# Patient Record
Sex: Female | Born: 1945
Health system: Southern US, Community
[De-identification: ages and names within clinical notes are randomized; demographics above are authoritative.]

## PROBLEM LIST (undated history)

## (undated) DIAGNOSIS — F329 Major depressive disorder, single episode, unspecified: Secondary | ICD-10-CM

## (undated) DIAGNOSIS — E785 Hyperlipidemia, unspecified: Secondary | ICD-10-CM

## (undated) DIAGNOSIS — D649 Anemia, unspecified: Secondary | ICD-10-CM

## (undated) DIAGNOSIS — I10 Essential (primary) hypertension: Secondary | ICD-10-CM

## (undated) DIAGNOSIS — F32A Depression, unspecified: Secondary | ICD-10-CM

## (undated) DIAGNOSIS — M48061 Spinal stenosis, lumbar region without neurogenic claudication: Secondary | ICD-10-CM

## (undated) DIAGNOSIS — F419 Anxiety disorder, unspecified: Secondary | ICD-10-CM

## (undated) DIAGNOSIS — T7840XA Allergy, unspecified, initial encounter: Secondary | ICD-10-CM

## (undated) DIAGNOSIS — G43909 Migraine, unspecified, not intractable, without status migrainosus: Secondary | ICD-10-CM

## (undated) DIAGNOSIS — R7303 Prediabetes: Secondary | ICD-10-CM

## (undated) HISTORY — DX: Essential (primary) hypertension: I10

## (undated) HISTORY — DX: Allergy, unspecified, initial encounter: T78.40XA

## (undated) HISTORY — DX: Prediabetes: R73.03

## (undated) HISTORY — DX: Anemia, unspecified: D64.9

## (undated) HISTORY — DX: Anxiety disorder, unspecified: F41.9

## (undated) HISTORY — DX: Depression, unspecified: F32.A

## (undated) HISTORY — PX: BREAST LUMPECTOMY: SHX2

## (undated) HISTORY — DX: Hyperlipidemia, unspecified: E78.5

## (undated) HISTORY — DX: Spinal stenosis, lumbar region without neurogenic claudication: M48.061

## (undated) HISTORY — DX: Migraine, unspecified, not intractable, without status migrainosus: G43.909

## (undated) HISTORY — PX: SPINE SURGERY: SHX786

## (undated) HISTORY — DX: Major depressive disorder, single episode, unspecified: F32.9

---

## 1997-08-15 HISTORY — PX: CHOLECYSTECTOMY: SHX55

## 1999-06-09 ENCOUNTER — Encounter: Payer: Self-pay | Admitting: Obstetrics and Gynecology

## 1999-06-09 ENCOUNTER — Encounter: Admission: RE | Admit: 1999-06-09 | Discharge: 1999-06-09 | Payer: Self-pay | Admitting: Obstetrics and Gynecology

## 1999-06-18 ENCOUNTER — Other Ambulatory Visit: Admission: RE | Admit: 1999-06-18 | Discharge: 1999-06-18 | Payer: Self-pay | Admitting: *Deleted

## 1999-07-02 ENCOUNTER — Encounter: Payer: Self-pay | Admitting: *Deleted

## 1999-07-02 ENCOUNTER — Encounter: Admission: RE | Admit: 1999-07-02 | Discharge: 1999-07-02 | Payer: Self-pay | Admitting: *Deleted

## 1999-07-13 ENCOUNTER — Encounter (INDEPENDENT_AMBULATORY_CARE_PROVIDER_SITE_OTHER): Payer: Self-pay | Admitting: Specialist

## 1999-07-13 ENCOUNTER — Other Ambulatory Visit: Admission: RE | Admit: 1999-07-13 | Discharge: 1999-07-13 | Payer: Self-pay | Admitting: *Deleted

## 2000-06-19 ENCOUNTER — Other Ambulatory Visit: Admission: RE | Admit: 2000-06-19 | Discharge: 2000-06-19 | Payer: Self-pay | Admitting: *Deleted

## 2000-11-03 ENCOUNTER — Encounter: Payer: Self-pay | Admitting: *Deleted

## 2000-11-03 ENCOUNTER — Encounter: Admission: RE | Admit: 2000-11-03 | Discharge: 2000-11-03 | Payer: Self-pay | Admitting: *Deleted

## 2001-07-04 ENCOUNTER — Other Ambulatory Visit: Admission: RE | Admit: 2001-07-04 | Discharge: 2001-07-04 | Payer: Self-pay | Admitting: Obstetrics and Gynecology

## 2002-07-05 ENCOUNTER — Other Ambulatory Visit: Admission: RE | Admit: 2002-07-05 | Discharge: 2002-07-05 | Payer: Self-pay | Admitting: Obstetrics and Gynecology

## 2003-06-11 ENCOUNTER — Other Ambulatory Visit: Admission: RE | Admit: 2003-06-11 | Discharge: 2003-06-11 | Payer: Self-pay | Admitting: Obstetrics and Gynecology

## 2003-06-13 ENCOUNTER — Encounter: Admission: RE | Admit: 2003-06-13 | Discharge: 2003-06-13 | Payer: Self-pay | Admitting: *Deleted

## 2004-02-09 ENCOUNTER — Ambulatory Visit (HOSPITAL_COMMUNITY): Admission: RE | Admit: 2004-02-09 | Discharge: 2004-02-09 | Payer: Self-pay | Admitting: Gastroenterology

## 2004-06-25 ENCOUNTER — Encounter: Admission: RE | Admit: 2004-06-25 | Discharge: 2004-06-25 | Payer: Self-pay | Admitting: Obstetrics and Gynecology

## 2004-09-15 ENCOUNTER — Ambulatory Visit (HOSPITAL_COMMUNITY): Admission: RE | Admit: 2004-09-15 | Discharge: 2004-09-15 | Payer: Self-pay | Admitting: Obstetrics and Gynecology

## 2004-09-15 ENCOUNTER — Encounter (INDEPENDENT_AMBULATORY_CARE_PROVIDER_SITE_OTHER): Payer: Self-pay | Admitting: Specialist

## 2004-09-15 ENCOUNTER — Ambulatory Visit (HOSPITAL_BASED_OUTPATIENT_CLINIC_OR_DEPARTMENT_OTHER): Admission: RE | Admit: 2004-09-15 | Discharge: 2004-09-15 | Payer: Self-pay | Admitting: Obstetrics and Gynecology

## 2005-07-22 ENCOUNTER — Encounter: Admission: RE | Admit: 2005-07-22 | Discharge: 2005-07-22 | Payer: Self-pay | Admitting: Obstetrics and Gynecology

## 2006-07-24 ENCOUNTER — Encounter: Admission: RE | Admit: 2006-07-24 | Discharge: 2006-07-24 | Payer: Self-pay | Admitting: Orthopaedic Surgery

## 2006-07-27 ENCOUNTER — Encounter: Admission: RE | Admit: 2006-07-27 | Discharge: 2006-07-27 | Payer: Self-pay | Admitting: Obstetrics and Gynecology

## 2006-08-25 ENCOUNTER — Encounter: Admission: RE | Admit: 2006-08-25 | Discharge: 2006-08-25 | Payer: Self-pay | Admitting: Internal Medicine

## 2006-09-13 ENCOUNTER — Encounter (INDEPENDENT_AMBULATORY_CARE_PROVIDER_SITE_OTHER): Payer: Self-pay | Admitting: Specialist

## 2006-09-13 ENCOUNTER — Ambulatory Visit (HOSPITAL_COMMUNITY): Admission: RE | Admit: 2006-09-13 | Discharge: 2006-09-13 | Payer: Self-pay | Admitting: General Surgery

## 2007-07-31 ENCOUNTER — Encounter: Admission: RE | Admit: 2007-07-31 | Discharge: 2007-07-31 | Payer: Self-pay | Admitting: Obstetrics and Gynecology

## 2008-08-01 ENCOUNTER — Encounter: Admission: RE | Admit: 2008-08-01 | Discharge: 2008-08-01 | Payer: Self-pay | Admitting: Obstetrics and Gynecology

## 2009-08-03 ENCOUNTER — Encounter: Admission: RE | Admit: 2009-08-03 | Discharge: 2009-08-03 | Payer: Self-pay | Admitting: Obstetrics and Gynecology

## 2010-06-18 ENCOUNTER — Encounter: Admission: RE | Admit: 2010-06-18 | Discharge: 2010-06-18 | Payer: Self-pay | Admitting: Orthopaedic Surgery

## 2010-09-02 ENCOUNTER — Other Ambulatory Visit: Payer: Self-pay | Admitting: Obstetrics and Gynecology

## 2010-09-05 ENCOUNTER — Encounter: Payer: Self-pay | Admitting: Orthopaedic Surgery

## 2010-12-31 NOTE — Op Note (Signed)
NAME:  TIKI, TUCCIARONE           ACCOUNT NO.:  [id number]   MEDICAL RECORD NO.:  [id number]          PATIENT TYPE:  AMB   LOCATION:  DAY                          FACILITY:  [facility]   PHYSICIAN:  Anselm Pancoast. Weatherly, M.D.DATE OF BIRTH:  May 23, 1946   DATE OF PROCEDURE:  09/13/2006  DATE OF DISCHARGE:                               OPERATIVE REPORT   PREOPERATIVE DIAGNOSIS:  Chronic cholecystitis with stones.   POSTOPERATIVE DIAGNOSIS:  Chronic cholecystitis with stones, kind of a  subacute gallbladder.   OPERATION:  Laparoscopic cholecystectomy with cholangiogram.   SURGEON:  Anselm Pancoast. Zachery Dakins, M.D.   ASSISTANT:  Nurse.   HISTORY:  Yajahira Tison is a 65 year old Caucasian female who was  referred to me about 2 weeks ago with intermittent symptomatic  gallstones. She is followed by Dr. Marisue Brooklyn and has had  intermittent episodes of indigestion and epigastric pain, recently  became more frequently. She takes medicine for migraine headaches and  then having the upper abdominal symptoms that are thought related to her  gallbladder. She does have a large stone in her gallbladder, and her x-  ray did not show 2 weeks ago that she really had an acute gallbladder.  Work arrangements were needed to made to allow her to be off for a few  days, and she picked a day for the planned surgery. Preoperatively, her  CMET and CBC were normal. The patient was given 3 g of Unasyn. She had  PAS stockings. She has had no previous upper abdominal surgery, and a  small incision was made below the umbilicus. The fascia was identified,  and then the posterior peritoneum rectus fascia was also picked up and  then opened through individually. It appears that really she has no kind  of single folded area at the umbilicus. A pursestring suture was placed,  the Hasson cannula introduced and then the gallbladder inspected. The  gallbladder was quite tense and kind of edematous as if this is kind  of  subacute. The upper 10-mm trocar is placed after anesthetizing the  fascia at the subxiphoid, and the 2 lateral 5-mm trocars were placed in  the appropriate position under direct vision. The gallbladder was  retracted upward and outward, the proximal portion of the gallbladder. I  first dissected off of the anterior branch of the cystic artery and  doubly clipped it proximally, singly distally and divided, and that gave  [place] good exposure to the cystic duct. A clip was placed across the cystic  duct gallbladder junction and then a little opening made proximally, the  Cook catheter introduced, and x-ray obtained which gave good prompt fill  of the extra hepatic biliary system. The catheter was removed. The cyst  duct was triply clipped and then divided and then carefully we dissected  this very thick, very swollen gallbladder from its bed. I clipped what I  think was a posterior branch of the cystic artery, and then the  remaining portion dissected off easily. There were a couple of areas  where the liver bed was exposed, and these were lightly coagulated at  the end. We placed the  gallbladder in an EndoCatch bag, switched the  camera to the upper 10-mm port, grabbed the bag, pulled it up through  the fascia, and then because of the size of the stone, we kind of pulled  the gallbladder up and then opened the gallbladder, releasing the bile  with the aspirator, and then everything would come through the fascial  defect. The gallbladder was removed from the field. There was a little  bit of bile with the gush of bile on opening that was irrigated from the  wound, and then we put 2 figure-of-eight of 0 Vicryl in the anterior  fascia under direct vision, irrigating all of the various layers as we  closed this. The 5-mm trocars were removed under direct vision after  removing the irrigating fluid, the minimal amount that had been used,  which was aspirated. The upper 10-mm trocar was  withdrawn under direct  vision, and the subcutaneous wounds were closed with 4-0 Vicryl and  Benzoin and Steri-Strips on the skin. The patient tolerated the  procedure nicely, and she hopes to be released today. I will see her in  the office in follow up in approximately 2 weeks, providing she is doing  nicely.           ______________________________  Anselm Pancoast. Zachery Dakins, M.D.     WJW/MEDQ  D:  09/13/2006  T:  09/13/2006  Job:  [id number]   cc:   Lovenia Kim, D.O.  Fax: [phone]   Anselm Pancoast. Zachery Dakins, M.D.  [address]  [place]  [place] [address]

## 2010-12-31 NOTE — Op Note (Signed)
NAME:  Rebekah Hamilton, Rebekah Hamilton                     ACCOUNT NO.:  [id number]   MEDICAL RECORD NO.:  [id number]                   PATIENT TYPE:  AMB   LOCATION:  ENDO                                 FACILITY:  [facility]   PHYSICIAN:  James L. Malon Kindle., M.D.          DATE OF BIRTH:  08-07-1946   DATE OF PROCEDURE:  02/09/2004  DATE OF DISCHARGE:                                 OPERATIVE REPORT   PROCEDURE:  Colonoscopy.   MEDICATIONS:  Fentanyl 87.5 mcg, Versed 8 mg IV.   INDICATIONS FOR PROCEDURE:  Colon cancer screening.   DESCRIPTION OF PROCEDURE:  The procedure had been explained to the patient  and consent obtained. With the patient in the left lateral decubitus  position, the Olympus scope was inserted and advanced. The prep was  excellent. We were able to advance easily to the cecum.  The ileocecal valve  and appendiceal orifice were seen. The scope was withdrawn and the cecum,  ascending colon, transverse colon, descending and sigmoid colon were seen  well and no polyps were seen.  There was no significant diverticular  disease. The scope was withdrawn in the rectum and the rectum was free of  polyps. The scope withdrawn.  The patient tolerated the procedure well.   ASSESSMENT:  Normal screening colonoscopy, V76.51.   PLAN:  Would recommend checking Hemoccults yearly and recommend repeating  colonoscopy in 10 years.                                               James L. Malon Kindle., M.D.    Waldron Session  D:  02/09/2004  T:  02/09/2004  Job:  [id number]   cc:   Dellis Anes. Idell Pickles, M.D.  [address]  [place]  [place] [address]  Fax: [phone]

## 2010-12-31 NOTE — Op Note (Signed)
NAME:  Rebekah Hamilton, Rebekah Hamilton           ACCOUNT NO.:  [id number]   MEDICAL RECORD NO.:  [id number]          PATIENT TYPE:  AMB   LOCATION:  [facility]                         FACILITY:  [facility]   PHYSICIAN:  Sherry A. Dickstein, M.D.DATE OF BIRTH:  11-30-1945   DATE OF PROCEDURE:  09/15/2004  DATE OF DISCHARGE:                                 OPERATIVE REPORT   PREOPERATIVE DIAGNOSES:  Postmenopausal bleeding, endometrial polyp.   POSTOPERATIVE DIAGNOSES:  Postmenopausal bleeding, endometrial polyp.   PROCEDURES:  Dilatation and curettage, hysteroscopy with resectoscope.   SURGEON:  Sherry A. Rosalio Macadamia, M.D.   ANESTHESIA:  MAC.   INDICATIONS:  This is a 64 year old, G2, P2, 0-0-2 woman, who has been on  hormone replacement therapy for several years.  The patient just recently  complained of irregular spotting which has been recurrent and intermittent  over the past year.  Because of this, the patient had an ultrasound.  Ultrasound revealed thickened endometrium with polypoid tissue seen on  ultrasound.  Because of this, the patient is brought to the operating room  for [facility], hysteroscopy with resectoscope.   FINDINGS:  Normal size anteflexed uterus, no adnexal mass, endometrial  present on the right side of the uterus.   PROCEDURE:  The patient was brought into the operating room and given  adequate IV sedation.  She was placed in the dorsal lithotomy position.  Her  perineum was washed with Hibiclens.  Pelvic examination was performed.  The  patient was draped in a sterile fashion.  Speculum was placed within the  vagina.  The vagina was washed with Hibiclens.  A paracervical block was  administered with 1% ethocaine.  Anterior lip of the cervix was grasped with  a single-toothed tenaculum.  The cervix was sounded.  The cervix was dilated  with Pratt dilators to a #31.  The hysteroscope was introduced into the  endometrial cavity.  Pictures were obtained.  Using a double loop right  angle  resector, the endometrial polyp was resected first.  Then, superficial  sheets of endometrium was resected circumferentially.  Adequate hemostasis  was present.  All instruments were removed from the vagina.  The patient was  taken out of the dorsal lithotomy position.  She was awakened. She was moved  from the operating table to a stretcher in stable condition.   COMPLICATIONS:  None.   ESTIMATED BLOOD LOSS:  Less than 5 cc.   SORBITOL DIFFERENTIAL:  -35 cc.      SAD/MEDQ  D:  09/15/2004  T:  09/15/2004  Job:  [id number]

## 2011-02-02 ENCOUNTER — Other Ambulatory Visit: Payer: Self-pay | Admitting: Obstetrics and Gynecology

## 2011-02-02 DIAGNOSIS — Z1231 Encounter for screening mammogram for malignant neoplasm of breast: Secondary | ICD-10-CM

## 2011-02-21 ENCOUNTER — Ambulatory Visit
Admission: RE | Admit: 2011-02-21 | Discharge: 2011-02-21 | Disposition: A | Discharge status: Home / Self Care | Payer: BC Managed Care – PPO | Source: Ambulatory Visit | Attending: Obstetrics and Gynecology | Admitting: Obstetrics and Gynecology

## 2011-02-21 DIAGNOSIS — Z1231 Encounter for screening mammogram for malignant neoplasm of breast: Secondary | ICD-10-CM

## 2011-12-09 ENCOUNTER — Other Ambulatory Visit: Payer: Self-pay | Admitting: Orthopaedic Surgery

## 2011-12-09 DIAGNOSIS — M545 Low back pain, unspecified: Secondary | ICD-10-CM

## 2011-12-09 DIAGNOSIS — M79604 Pain in right leg: Secondary | ICD-10-CM

## 2011-12-12 ENCOUNTER — Ambulatory Visit
Admission: RE | Admit: 2011-12-12 | Discharge: 2011-12-12 | Disposition: A | Discharge status: Home / Self Care | Payer: BC Managed Care – PPO | Source: Ambulatory Visit | Attending: Orthopaedic Surgery | Admitting: Orthopaedic Surgery

## 2011-12-12 VITALS — BP 140/75 | HR 61

## 2011-12-12 DIAGNOSIS — M79604 Pain in right leg: Secondary | ICD-10-CM

## 2011-12-12 DIAGNOSIS — M545 Low back pain, unspecified: Secondary | ICD-10-CM

## 2011-12-12 MED ORDER — METHYLPREDNISOLONE ACETATE 40 MG/ML INJ SUSP (RADIOLOG
120.0000 mg | Freq: Once | INTRAMUSCULAR | Status: AC
  Administered 2011-12-12: 120 mg via EPIDURAL

## 2011-12-12 MED ORDER — IOHEXOL 180 MG/ML  SOLN
1.0000 mL | Freq: Once | INTRAMUSCULAR | Status: AC | PRN
  Administered 2011-12-12: 1 mL via EPIDURAL

## 2011-12-12 NOTE — Discharge Instructions (Signed)

## 2012-06-06 ENCOUNTER — Other Ambulatory Visit: Payer: Self-pay | Admitting: Obstetrics and Gynecology

## 2012-06-06 DIAGNOSIS — Z1231 Encounter for screening mammogram for malignant neoplasm of breast: Secondary | ICD-10-CM

## 2012-07-06 ENCOUNTER — Ambulatory Visit
Admission: RE | Admit: 2012-07-06 | Discharge: 2012-07-06 | Disposition: A | Discharge status: Home / Self Care | Payer: Medicare Other | Source: Ambulatory Visit | Attending: Obstetrics and Gynecology | Admitting: Obstetrics and Gynecology

## 2012-07-06 DIAGNOSIS — Z1231 Encounter for screening mammogram for malignant neoplasm of breast: Secondary | ICD-10-CM

## 2012-07-09 ENCOUNTER — Ambulatory Visit: Payer: BC Managed Care – PPO

## 2013-07-19 ENCOUNTER — Other Ambulatory Visit: Payer: Self-pay

## 2013-07-19 DIAGNOSIS — Z1231 Encounter for screening mammogram for malignant neoplasm of breast: Secondary | ICD-10-CM

## 2013-08-12 ENCOUNTER — Ambulatory Visit (INDEPENDENT_AMBULATORY_CARE_PROVIDER_SITE_OTHER): Payer: Medicare Other

## 2013-08-12 DIAGNOSIS — Z23 Encounter for immunization: Secondary | ICD-10-CM

## 2013-08-12 NOTE — Progress Notes (Signed)
Patient ID: Rebekah Hamilton, female   DOB: [birth year], 67 y.o.   MRN: [id number] Patient here today for flu vaccination, tolerated well, 0.5 cc left deltoid.

## 2013-08-26 ENCOUNTER — Ambulatory Visit
Admission: RE | Admit: 2013-08-26 | Discharge: 2013-08-26 | Disposition: A | Discharge status: Home / Self Care | Payer: Medicare Other | Source: Ambulatory Visit

## 2013-08-26 DIAGNOSIS — Z1231 Encounter for screening mammogram for malignant neoplasm of breast: Secondary | ICD-10-CM | POA: Diagnosis not present

## 2013-10-31 DIAGNOSIS — T7840XA Allergy, unspecified, initial encounter: Secondary | ICD-10-CM | POA: Insufficient documentation

## 2013-10-31 DIAGNOSIS — I10 Essential (primary) hypertension: Secondary | ICD-10-CM | POA: Insufficient documentation

## 2013-10-31 DIAGNOSIS — E782 Mixed hyperlipidemia: Secondary | ICD-10-CM | POA: Insufficient documentation

## 2013-10-31 DIAGNOSIS — F339 Major depressive disorder, recurrent, unspecified: Secondary | ICD-10-CM | POA: Insufficient documentation

## 2013-10-31 DIAGNOSIS — M48061 Spinal stenosis, lumbar region without neurogenic claudication: Secondary | ICD-10-CM | POA: Insufficient documentation

## 2013-10-31 DIAGNOSIS — R7309 Other abnormal glucose: Secondary | ICD-10-CM | POA: Insufficient documentation

## 2013-10-31 DIAGNOSIS — G43909 Migraine, unspecified, not intractable, without status migrainosus: Secondary | ICD-10-CM | POA: Insufficient documentation

## 2013-10-31 DIAGNOSIS — E785 Hyperlipidemia, unspecified: Secondary | ICD-10-CM

## 2013-10-31 DIAGNOSIS — F419 Anxiety disorder, unspecified: Secondary | ICD-10-CM | POA: Insufficient documentation

## 2013-10-31 DIAGNOSIS — F3340 Major depressive disorder, recurrent, in remission, unspecified: Secondary | ICD-10-CM | POA: Insufficient documentation

## 2013-11-01 ENCOUNTER — Ambulatory Visit (INDEPENDENT_AMBULATORY_CARE_PROVIDER_SITE_OTHER): Payer: Medicare Other | Admitting: Physician Assistant

## 2013-11-01 ENCOUNTER — Encounter: Payer: Self-pay | Admitting: Physician Assistant

## 2013-11-01 VITALS — BP 138/78 | HR 80 | Temp 97.7°F | Resp 16 | Ht 62.5 in | Wt 158.0 lb

## 2013-11-01 DIAGNOSIS — R7303 Prediabetes: Secondary | ICD-10-CM

## 2013-11-01 DIAGNOSIS — F3289 Other specified depressive episodes: Secondary | ICD-10-CM

## 2013-11-01 DIAGNOSIS — I10 Essential (primary) hypertension: Secondary | ICD-10-CM

## 2013-11-01 DIAGNOSIS — F32A Depression, unspecified: Secondary | ICD-10-CM

## 2013-11-01 DIAGNOSIS — Z79899 Other long term (current) drug therapy: Secondary | ICD-10-CM | POA: Diagnosis not present

## 2013-11-01 DIAGNOSIS — F329 Major depressive disorder, single episode, unspecified: Secondary | ICD-10-CM | POA: Diagnosis not present

## 2013-11-01 DIAGNOSIS — R7309 Other abnormal glucose: Secondary | ICD-10-CM | POA: Diagnosis not present

## 2013-11-01 DIAGNOSIS — E559 Vitamin D deficiency, unspecified: Secondary | ICD-10-CM

## 2013-11-01 DIAGNOSIS — E785 Hyperlipidemia, unspecified: Secondary | ICD-10-CM | POA: Diagnosis not present

## 2013-11-01 DIAGNOSIS — F419 Anxiety disorder, unspecified: Secondary | ICD-10-CM

## 2013-11-01 DIAGNOSIS — Z0000 Encounter for general adult medical examination without abnormal findings: Secondary | ICD-10-CM | POA: Diagnosis not present

## 2013-11-01 DIAGNOSIS — G43909 Migraine, unspecified, not intractable, without status migrainosus: Secondary | ICD-10-CM

## 2013-11-01 LAB — BASIC METABOLIC PANEL WITH GFR
BUN: 12 mg/dL (ref 6–23)
CALCIUM: 9.5 mg/dL (ref 8.4–10.5)
CO2: 26 meq/L (ref 19–32)
CREATININE: 0.82 mg/dL (ref 0.50–1.10)
Chloride: 107 mEq/L (ref 96–112)
GFR, EST AFRICAN AMERICAN: 86 mL/min
GFR, Est Non African American: 75 mL/min
GLUCOSE: 85 mg/dL (ref 70–99)
Potassium: 3.9 mEq/L (ref 3.5–5.3)
SODIUM: 144 meq/L (ref 135–145)

## 2013-11-01 LAB — LIPID PANEL
Cholesterol: 184 mg/dL (ref 0–200)
HDL: 45 mg/dL (ref >39)
LDL Cholesterol: 110 mg/dL — ABNORMAL HIGH (ref 0–99)
Total CHOL/HDL Ratio: 4.1 Ratio
Triglycerides: 145 mg/dL (ref <150)
VLDL: 29 mg/dL (ref 0–40)

## 2013-11-01 LAB — CBC WITH DIFFERENTIAL/PLATELET
BASOS PCT: 0 % (ref 0–1)
Basophils Absolute: 0 10*3/uL (ref 0.0–0.1)
EOS ABS: 0.2 10*3/uL (ref 0.0–0.7)
EOS PCT: 2 % (ref 0–5)
HCT: 46 % (ref 36.0–46.0)
Hemoglobin: 15.6 g/dL — ABNORMAL HIGH (ref 12.0–15.0)
Lymphocytes Relative: 35 % (ref 12–46)
Lymphs Abs: 2.6 10*3/uL (ref 0.7–4.0)
MCH: 30.2 pg (ref 26.0–34.0)
MCHC: 33.9 g/dL (ref 30.0–36.0)
MCV: 89.1 fL (ref 78.0–100.0)
MONOS PCT: 9 % (ref 3–12)
Monocytes Absolute: 0.7 10*3/uL (ref 0.1–1.0)
NEUTROS PCT: 54 % (ref 43–77)
Neutro Abs: 4.1 10*3/uL (ref 1.7–7.7)
PLATELETS: 306 10*3/uL (ref 150–400)
RBC: 5.16 MIL/uL — ABNORMAL HIGH (ref 3.87–5.11)
RDW: 13.3 % (ref 11.5–15.5)
WBC: 7.5 10*3/uL (ref 4.0–10.5)

## 2013-11-01 LAB — HEPATIC FUNCTION PANEL
ALBUMIN: 4.6 g/dL (ref 3.5–5.2)
ALT: 18 U/L (ref 0–35)
AST: 20 U/L (ref 0–37)
Alkaline Phosphatase: 53 U/L (ref 39–117)
Bilirubin, Direct: 0.1 mg/dL (ref 0.0–0.3)
Indirect Bilirubin: 0.3 mg/dL (ref 0.2–1.2)
TOTAL PROTEIN: 6.9 g/dL (ref 6.0–8.3)
Total Bilirubin: 0.4 mg/dL (ref 0.2–1.2)

## 2013-11-01 LAB — HEMOGLOBIN A1C
HEMOGLOBIN A1C: 5.8 % — AB (ref <5.7)
Mean Plasma Glucose: 120 mg/dL — ABNORMAL HIGH (ref <117)

## 2013-11-01 LAB — MAGNESIUM: MAGNESIUM: 2 mg/dL (ref 1.5–2.5)

## 2013-11-01 MED ORDER — PHENTERMINE HCL 37.5 MG PO TABS
37.5000 mg | ORAL_TABLET | Freq: Every day | ORAL | Status: DC
Start: 2013-11-01 — End: 2013-12-02

## 2013-11-01 MED ORDER — ALPRAZOLAM 0.25 MG PO TABS: 0.2500 mg | ORAL_TABLET | Freq: Three times a day (TID) | ORAL | Status: DC | PRN

## 2013-11-01 MED ORDER — VORTIOXETINE HBR 10 MG PO TABS
ORAL_TABLET | ORAL | Status: DC
Start: 2013-11-01 — End: 2014-02-04

## 2013-11-01 NOTE — Patient Instructions (Signed)
Phentermine  While taking the medication we will ask that you come into the office once a month to monitor your weight, blood pressure, and heart rate. In addition we can help answer your questions about diet, exercise, and help you every step of the way with your weight loss journey. Sometime it is helpful if you bring in a food diary or use an app on your phone such as myfitnesspal to record your calorie intake, especially in the beginning.   You can start out on 1/3 to 1/2 a pill in the morning and if you are tolerating it well you can increase to one pill daily.   What is this medicine? PHENTERMINE (FEN ter meen) decreases your appetite. This medicine is intended to be used in addition to a healthy reduced calorie diet and exercise. The best results are achieved this way. This medicine is only indicated for short-term use. Eventually your weight loss may level out and the medication will no longer be needed.   How should I use this medicine? Take this medicine by mouth. Follow the directions on the prescription label. The tablets should stay in the bottle until immediately before you take your dose. Take your doses at regular intervals. Do not take your medicine more often than directed.  Overdosage: If you think you have taken too much of this medicine contact a poison control center or emergency room at once. NOTE: This medicine is only for you. Do not share this medicine with others.  What if I miss a dose? If you miss a dose, take it as soon as you can. If it is almost time for your next dose, take only that dose. Do not take double or extra doses. Do not increase or in any way change your dose without consulting your doctor.  What should I watch for while using this medicine? Notify your physician immediately if you become short of breath while doing your normal activities. Do not take this medicine within 6 hours of bedtime. It can keep you from getting to sleep. Avoid drinks that contain  caffeine and try to stick to a regular bedtime every night. Do not stand or sit up quickly, especially if you are an older patient. This reduces the risk of dizzy or fainting spells. Avoid alcoholic drinks.  What side effects may I notice from receiving this medicine? Side effects that you should report to your doctor or health care professional as soon as possible: -chest pain, palpitations -depression or severe changes in mood -increased blood pressure -irritability -nervousness or restlessness -severe dizziness -shortness of breath -problems urinating -unusual swelling of the legs -vomiting  Side effects that usually do not require medical attention (report to your doctor or health care professional if they continue or are bothersome): -blurred vision or other eye problems -changes in sexual ability or desire -constipation or diarrhea -difficulty sleeping -dry mouth or unpleasant taste -headache -nausea This list may not describe all possible side effects. Call your doctor for medical advice about side effects. You may report side effects to FDA at 1-800-FDA-1088.  What is the TMJ? The temporomandibular (tem-PUH-ro-man-DIB-yoo-ler) joint, or the TMJ, connects the upper and lower jawbones. This joint allows the jaw to open wide and move back and forth when you chew, talk, or yawn.There are also several muscles that help this joint move. There can be muscle tightness and pain in the muscle that can cause several symptoms.  What causes TMJ pain? There are many causes of TMJ pain. Repeated  chewing (for example, chewing gum) and clenching your teeth can cause pain in the joint. Some TMJ pain has no obvious cause. What can I do to ease the pain? There are many things you can do to help your pain get better. When you have pain:  Eat soft foods and stay away from chewy foods (for example, taffy) Try to use both sides of your mouth to chew Don't chew gum Don't open your mouth wide (for  example, during yawning or singing) Don't bite your cheeks or fingernails Lower your amount of stress and worry Applying a warm, damp washcloth to the joint may help. Over-the-counter pain medicines such as ibuprofen (one brand: Advil) or acetaminophen (one brand: Tylenol) might also help. Do not use these medicines if you are allergic to them or if your doctor told you not to use them. How can I stop the pain from coming back? When your pain is better, you can do these exercises to make your muscles stronger and to keep the pain from coming back:  Resisted mouth opening: Place your thumb or two fingers under your chin and open your mouth slowly, pushing up lightly on your chin with your thumb. Hold for three to six seconds. Close your mouth slowly. Resisted mouth closing: Place your thumbs under your chin and your two index fingers on the ridge between your mouth and the bottom of your chin. Push down lightly on your chin as you close your mouth. Tongue up: Slowly open and close your mouth while keeping the tongue touching the roof of the mouth. Side-to-side jaw movement: Place an object about one fourth of an inch thick (for example, two tongue depressors) between your front teeth. Slowly move your jaw from side to side. Increase the thickness of the object as the exercise becomes easier Forward jaw movement: Place an object about one fourth of an inch thick between your front teeth and move the bottom jaw forward so that the bottom teeth are in front of the top teeth. Increase the thickness of the object as the exercise becomes easier. These exercises should not be painful. If it hurts to do these exercises, stop doing them and talk to your family doctor.   Preventative Care for Adults - Female      MAINTAIN REGULAR HEALTH EXAMS:  A routine yearly physical is a good way to check in with your primary care provider about your health and preventive screening. It is also an opportunity to share  updates about your health and any concerns you have, and receive a thorough all-over exam.   Most health insurance companies pay for at least some preventative services.  Check with your health plan for specific coverages.  WHAT PREVENTATIVE SERVICES DO WOMEN NEED?  Adult women should have their weight and blood pressure checked regularly.   Women age 56 and older should have their cholesterol levels checked regularly.  Women should be screened for cervical cancer with a Pap smear and pelvic exam beginning at either age 1, or 3 years after they become sexually activity.    Breast cancer screening generally begins at age 91 with a mammogram and breast exam by your primary care provider.    Beginning at age 15 and continuing to age 65, women should be screened for colorectal cancer.  Certain people may need continued testing until age 26.  Updating vaccinations is part of preventative care.  Vaccinations help protect against diseases such as the flu.  Osteoporosis is a disease in  which the bones lose minerals and strength as we age. Women ages 2565 and over should discuss this with their caregivers, as should women after menopause who have other risk factors.  Lab tests are generally done as part of preventative care to screen for anemia and blood disorders, to screen for problems with the kidneys and liver, to screen for bladder problems, to check blood sugar, and to check your cholesterol level.  Preventative services generally include counseling about diet, exercise, avoiding tobacco, drugs, excessive alcohol consumption, and sexually transmitted infections.    GENERAL RECOMMENDATIONS FOR GOOD HEALTH:  Healthy diet:  Eat a variety of foods, including fruit, vegetables, animal or vegetable protein, such as meat, fish, chicken, and eggs, or beans, lentils, tofu, and grains, such as rice.  Drink plenty of water daily.  Decrease saturated fat in the diet, avoid lots of red meat, processed  foods, sweets, fast foods, and fried foods.  Exercise:  Aerobic exercise helps maintain good heart health. At least 30-40 minutes of moderate-intensity exercise is recommended. For example, a brisk walk that increases your heart rate and breathing. This should be done on most days of the week.   Find a type of exercise or a variety of exercises that you enjoy so that it becomes a part of your daily life.  Examples are running, walking, swimming, water aerobics, and biking.  For motivation and support, explore group exercise such as aerobic class, spin class, Zumba, Yoga,or  martial arts, etc.    Set exercise goals for yourself, such as a certain weight goal, walk or run in a race such as a 5k walk/run.  Speak to your primary care provider about exercise goals.  Disease prevention:  If you smoke or chew tobacco, find out from your caregiver how to quit. It can literally save your life, no matter how long you have been a tobacco user. If you do not use tobacco, never begin.   Maintain a healthy diet and normal weight. Increased weight leads to problems with blood pressure and diabetes.   The Body Mass Index or BMI is a way of measuring how much of your body is fat. Having a BMI above 27 increases the risk of heart disease, diabetes, hypertension, stroke and other problems related to obesity. Your caregiver can help determine your BMI and based on it develop an exercise and dietary program to help you achieve or maintain this important measurement at a healthful level.  High blood pressure causes heart and blood vessel problems.  Persistent high blood pressure should be treated with medicine if weight loss and exercise do not work.   Fat and cholesterol leaves deposits in your arteries that can block them. This causes heart disease and vessel disease elsewhere in your body.  If your cholesterol is found to be high, or if you have heart disease or certain other medical conditions, then you may need  to have your cholesterol monitored frequently and be treated with medication.   Ask if you should have a cardiac stress test if your history suggests this. A stress test is a test done on a treadmill that looks for heart disease. This test can find disease prior to there being a problem.  Menopause can be associated with physical symptoms and risks. Hormone replacement therapy is available to decrease these. You should talk to your caregiver about whether starting or continuing to take hormones is right for you.   Osteoporosis is a disease in which the bones lose minerals  and strength as we age. This can result in serious bone fractures. Risk of osteoporosis can be identified using a bone density scan. Women ages 52 and over should discuss this with their caregivers, as should women after menopause who have other risk factors. Ask your caregiver whether you should be taking a calcium supplement and Vitamin D, to reduce the rate of osteoporosis.   Avoid drinking alcohol in excess (more than two drinks per day).  Avoid use of street drugs. Do not share needles with anyone. Ask for professional help if you need assistance or instructions on stopping the use of alcohol, cigarettes, and/or drugs.  Brush your teeth twice a day with fluoride toothpaste, and floss once a day. Good oral hygiene prevents tooth decay and gum disease. The problems can be painful, unattractive, and can cause other health problems. Visit your dentist for a routine oral and dental check up and preventive care every 6-12 months.   Look at your skin regularly.  Use a mirror to look at your back. Notify your caregivers of changes in moles, especially if there are changes in shapes, colors, a size larger than a pencil eraser, an irregular border, or development of new moles.  Safety:  Use seatbelts 100% of the time, whether driving or as a passenger.  Use safety devices such as hearing protection if you work in environments with loud  noise or significant background noise.  Use safety glasses when doing any work that could send debris in to the eyes.  Use a helmet if you ride a bike or motorcycle.  Use appropriate safety gear for contact sports.  Talk to your caregiver about gun safety.  Use sunscreen with a SPF (or skin protection factor) of 15 or greater.  Lighter skinned people are at a greater risk of skin cancer. Don't forget to also wear sunglasses in order to protect your eyes from too much damaging sunlight. Damaging sunlight can accelerate cataract formation.   Practice safe sex. Use condoms. Condoms are used for birth control and to help reduce the spread of sexually transmitted infections (or STIs).  Some of the STIs are gonorrhea (the clap), chlamydia, syphilis, trichomonas, herpes, HPV (human papilloma virus) and HIV (human immunodeficiency virus) which causes AIDS. The herpes, HIV and HPV are viral illnesses that have no cure. These can result in disability, cancer and death.   Keep carbon monoxide and smoke detectors in your home functioning at all times. Change the batteries every 6 months or use a model that plugs into the wall.   Vaccinations:  Stay up to date with your tetanus shots and other required immunizations. You should have a booster for tetanus every 10 years. Be sure to get your flu shot every year, since 5%-20% of the [place] population comes down with the flu. The flu vaccine changes each year, so being vaccinated once is not enough. Get your shot in the fall, before the flu season peaks.   Other vaccines to consider:  Human Papilloma Virus or HPV causes cancer of the cervix, and other infections that can be transmitted from person to person. There is a vaccine for HPV, and females should get immunized between the ages of 59 and 15. It requires a series of 3 shots.   Pneumococcal vaccine to protect against certain types of pneumonia.  This is normally recommended for adults age 18 or older.  However,  adults younger than 67 years old with certain underlying conditions such as diabetes, heart or lung disease  should also receive the vaccine.  Shingles vaccine to protect against Varicella Zoster if you are older than age 74, or younger than 67 years old with certain underlying illness.  Hepatitis A vaccine to protect against a form of infection of the liver by a virus acquired from food.  Hepatitis B vaccine to protect against a form of infection of the liver by a virus acquired from blood or body fluids, particularly if you work in health care.  If you plan to travel internationally, check with your local health department for specific vaccination recommendations.  Cancer Screening:  Breast cancer screening is essential to preventive care for women. All women age 60 and older should perform a breast self-exam every month. At age 52 and older, women should have their caregiver complete a breast exam each year. Women at ages 72 and older should have a mammogram (x-ray film) of the breasts. Your caregiver can discuss how often you need mammograms.    Cervical cancer screening includes taking a Pap smear (sample of cells examined under a microscope) from the cervix (end of the uterus). It also includes testing for HPV (Human Papilloma Virus, which can cause cervical cancer). Screening and a pelvic exam should begin at age 71, or 3 years after a woman becomes sexually active. Screening should occur every year, with a Pap smear but no HPV testing, up to age 34. After age 25, you should have a Pap smear every 3 years with HPV testing, if no HPV was found previously.   Most routine colon cancer screening begins at the age of 81. On a yearly basis, doctors may provide special easy to use take-home tests to check for hidden blood in the stool. Sigmoidoscopy or colonoscopy can detect the earliest forms of colon cancer and is life saving. These tests use a small camera at the end of a tube to directly examine  the colon. Speak to your caregiver about this at age 26, when routine screening begins (and is repeated every 5 years unless early forms of pre-cancerous polyps or small growths are found).

## 2013-11-01 NOTE — Progress Notes (Signed)
Subjective:   Rebekah Hamilton is a 68 y.o. female who presents for Medicare Annual Wellness Visit and 3 month follow up on hypertension, prediabetes, hyperlipidemia, vitamin D def.  Date of last medicare wellness visit is unknown.   Her blood pressure has been controlled at home, today their BP is BP: 138/78 mmHg She does not workout, too busy and has lumbar stenosis. She denies chest pain, shortness of breath, dizziness.  She is on cholesterol medication and denies myalgias. Her cholesterol is at goal. The cholesterol last visit was:  LDL 58 She has been working on diet and exercise for prediabetes, and denies foot ulcerations, nausea, paresthesia of the feet, polydipsia and polyuria. Last A1C in the office was: 5.8 Patient is on Vitamin D supplement.  Anxiety- normally just take 1/2 tablet in the morning but has had increasing stress so she is taking more due to headaches, husband with surgery, sick mother in [place] She was on prozac at one point and would like to try another medication, she states she has tried several but they make her feel forgetful.  Names of Other Physician/Practitioners you currently use: 1. [facility]- here for primary care 2. [facility], eye doctor, last visit Aug 2014, beginning [place] 3. ,Dr. Evelene Croon  dentist, last visit 06/2013 4. Dr. Jannifer Franklin 5. Dr. Durward Fortes Patient Care Team: Unk Pinto, MD as PCP - General (Internal Medicine)  Medication Review Current Outpatient Prescriptions on File Prior to Visit  Medication Sig Dispense Refill  . ALPRAZolam (XANAX) 0.25 MG tablet Take 0.25 mg by mouth 3 (three) times daily as needed.       Marland Kitchen aspirin 325 MG tablet Take 325 mg by mouth daily.      . Cholecalciferol (VITAMIN D PO) Take 4,000 Int'l Units by mouth.      . estradiol (ESTRACE) 0.5 MG tablet Take 0.5 mg by mouth at bedtime.      . fexofenadine (ALLEGRA) 180 MG tablet Take 180 mg by mouth daily.      .  pravastatin (PRAVACHOL) 40 MG tablet Take 40 mg by mouth daily.      . progesterone (PROMETRIUM) 100 MG capsule Take 100 mg by mouth daily.      . rizatriptan (MAXALT) 10 MG tablet Take 10 mg by mouth as needed for migraine. May repeat in 2 hours if needed       No current facility-administered medications on file prior to visit.    Current Problems (verified) Patient Active Problem List   Diagnosis Date Noted  . Hyperlipidemia   . Hypertension   . Prediabetes   . Migraines   . Allergy   . Anemia   . Anxiety   . Depression   . Lumbar stenosis     Screening Tests Health Maintenance  Topic Date Due  . Colonoscopy  02/12/1997  . Zostavax  02/13/2007  . Influenza Vaccine  03/15/2014  . Tetanus/tdap  08/16/2015  . Mammogram  08/27/2015  . Pneumococcal Polysaccharide Vaccine Age 65 And Over  Completed     Immunization History  Administered Date(s) Administered  . Influenza,inj,quad, With Preservative 08/12/2013  . Pneumococcal Polysaccharide-23 05/29/2013  . Td 08/15/2005    Preventative care: Last colonoscopy: 2006- due next year Last mammogram: 08/2013 Last pap smear/pelvic exam: 2014 neg   DEXA: 2000- gets MRI for back so declines  Prior vaccinations: TD or Tdap: 2007 Influenza: 2014 Pneumococcal: 2014 Shingles/Zostavax: unknown  History reviewed: allergies, current medications, past family history,  past medical history, past social history, past surgical history and problem list  Risk Factors: Osteoporosis: postmenopausal estrogen deficiency and dietary calcium and/or vitamin D deficiency History of fracture in the past year: no  Tobacco History  Substance Use Topics  . Smoking status: Never Smoker   . Smokeless tobacco: Not on file  . Alcohol Use: No     Comment: Rare-Wine   She does not smoke.  Patient is not a former smoker. Are there smokers in your home (other than you)?  No  Alcohol Current alcohol use: none  Caffeine Current caffeine use:  coffee 1 /day  Exercise Exercise limitations: The patient is experiencing exercise intolerance (lumbar stenosis). Current exercise: none  Nutrition/Diet Current diet: in general, a "healthy" diet    Cardiac risk factors: advanced age (older than 76 for men, 71 for women), dyslipidemia, hypertension and sedentary lifestyle.  Depression Screen Nurse depression screen reviewed.  (Note: if answer to either of the following is "Yes", a more complete depression screening is indicated)   Q1: Over the past two weeks, have you felt down, depressed or hopeless? Yes  Q2: Over the past two weeks, have you felt little interest or pleasure in doing things? Yes  Have you lost interest or pleasure in daily life? No  Do you often feel hopeless? No  Do you cry easily over simple problems? No  Activities of Daily Living Nurse ADLs screen reviewed.  In your present state of health, do you have any difficulty performing the following activities?:  Driving? No Managing money?  No Feeding yourself? No Getting from bed to chair? No Climbing a flight of stairs? yes Preparing food and eating?: No Bathing or showering? No Getting dressed: No Getting to the toilet? No Using the toilet:No Moving around from place to place: Yes In the past year have you fallen or had a near fall?:No   Are you sexually active?  No  Do you have more than one partner?  No  Vision Difficulties: No  Hearing Difficulties: No Do you often ask people to speak up or repeat themselves? No Do you experience ringing or noises in your ears? No Do you have difficulty understanding soft or whispered voices? No  Cognition  Do you feel that you have a problem with memory?No  Do you often misplace items? No  Do you feel safe at home?  Yes  Advanced directives Does patient have a [place]? Yes Does patient have a Living Will? Yes    Objective:     Vision and hearing screens reviewed.   Blood  pressure 138/78, pulse 80, temperature 97.7 F (36.5 C), resp. rate 16, height 5' 2.5" (1.588 m), weight 158 lb (71.668 kg). Body mass index is 28.42 kg/(m^2).  General appearance: alert, no distress, WD/WN,  female Cognitive Testing  Alert? Yes  Normal Appearance?Yes  Oriented to person? Yes  Place? Yes   Time? Yes  Recall of three objects?  Yes  Can perform simple calculations? Yes  Displays appropriate judgment?Yes  Can read the correct time from a watch face?Yes  HEENT: normocephalic, sclerae anicteric, TMs pearly, nares patent, no discharge or erythema, pharynx normal, + TMJ left more than right.  Oral cavity: MMM, no lesions Neck: supple, no lymphadenopathy, no thyromegaly, no masses Heart: RRR, normal S1, S2, no murmurs Lungs: CTA bilaterally, no wheezes, rhonchi, or rales Abdomen: +bs, soft, non tender, non distended, no masses, no hepatomegaly, no splenomegaly Musculoskeletal: nontender, no swelling, no obvious deformity  Extremities: no edema, no cyanosis, no clubbing Pulses: 2+ symmetric, upper and lower extremities, normal cap refill Neurological: alert, oriented x 3, CN2-12 intact, strength normal upper extremities and lower extremities, sensation normal throughout, DTRs 2+ throughout, no cerebellar signs, gait normal Psychiatric: normal affect, behavior normal, pleasant  Breast: defer Gyn: defer  Rectal: defer   Assessment:    1. Hypertension - CBC with Differential - BASIC METABOLIC PANEL WITH GFR - Hepatic function panel - TSH  2. Migraines- likely TMJ, information given.  information given to the patient, no gum/decrease hard foods, warm wet wash clothes, decrease stress, talk with dentist about possible night guard, can do massage, and exercise.   3. Prediabetes - Hemoglobin A1c - Insulin, fasting - HM DIABETES FOOT EXAM  4. Hyperlipidemia - Lipid panel  5. Unspecified vitamin D deficiency  6. Encounter for long-term (current) use of other  medications - Magnesium  7. Obesity with co morbidities- long discussion about weight loss, diet, and exercise  Will start the patient on phentermine- hand out given  Follow up in 1 month with food diary  8.Anxiety/+ Depression screening-  Brintellix take QOD and then daily, refill xanax, stress techniques disucssed.   Plan:   During the course of the visit the patient was educated and counseled about appropriate screening and preventive services including:    Pneumococcal vaccine   Influenza vaccine  Screening electrocardiogram  Screening mammography  Bone densitometry screening  Colorectal cancer screening  Diabetes screening  Glaucoma screening  Nutrition counseling   Screening recommendations, referrals:  Vaccinations: Tdap vaccine no  Influenza vaccine no Pneumococcal vaccine no Shingles vaccine not done- will check price Hep B vaccine no  Nutrition assessed and recommended  Colonoscopy yes Mammogram yes Pap smear no Pelvic exam no Recommended yearly ophthalmology/optometry visit for glaucoma screening and checkup Recommended yearly dental visit for hygiene and checkup Advanced directives - not done, patient has done  Conditions/risks identified: BMI: Discussed weight loss, diet, and increase physical activity. Will try phentermine and follow up in one month.  Increase physical activity: AHA recommends 150 minutes of physical activity a week.  Medications reviewed DEXA- declined Diabetes is at goal, ACE/ARB therapy: Yes. Urinary Incontinence is not an issue: discussed non pharmacology and pharmacology options.  Fall risk: moderate- discussed PT for lumbar stenosis, home fall assessment, medications.  Depression- will try Brintellix and follow up in one month.   Medicare Attestation I have personally reviewed: The patient's medical and social history Their use of alcohol, tobacco or illicit drugs Their current medications and supplements The  patient's functional ability including ADLs,fall risks, home safety risks, cognitive, and hearing and visual impairment Diet and physical activities Evidence for depression or mood disorders  The patient's weight, height, BMI, and visual acuity have been recorded in the chart.  I have made referrals, counseling, and provided education to the patient based on review of the above and I have provided the patient with a written personalized care plan for preventive services.     Vicie Mutters, PA-C   11/01/2013

## 2013-11-02 LAB — TSH: TSH: 1.724 u[IU]/mL (ref 0.350–4.500)

## 2013-11-02 LAB — INSULIN, FASTING: INSULIN FASTING, SERUM: 25 u[IU]/mL (ref 3–28)

## 2013-12-01 DIAGNOSIS — Z79899 Other long term (current) drug therapy: Secondary | ICD-10-CM | POA: Insufficient documentation

## 2013-12-01 NOTE — Progress Notes (Signed)
Patient ID: Rebekah Hamilton, female   DOB: 08/02/1946, 68 y.o.   MRN: [id number]    This very nice 68 y.o. MWF  with Hypertension, Hyperlipidemia, Pre-Diabetes and Vitamin D Deficiency presents for 1 month follow up of initiating Phentermine for weight loss and Brintelllix . Patient stopped the Brintellix after 3 days because of persistent nausea.   She does report ongoing stressful circumstances in her life and notes that she ha been intolerant to a variety of SSRI's and also Wellbutrin in the past. She state she occas takes 1/2 of Xanex 0.25 mg in the morning and also notes she is having problems with insomnia.    BP has been controlled at home. Today's BP: 132/76 mmHg . Patient denies any cardiac type chest pain, palpitations, dyspnea/orthopnea/PND, dizziness, claudication, or dependent edema.   Hyperlipidemia is controlled with diet & meds. Last Cholesterol was 184, Triglycerides were 145, HDL 45 and elevated LDL 110 in Mar 2015. Patient denies myalgias or other med SE's. Patient was started on Brintellix   Also, the patient has history of PreDiabetes with last A1c of 5.8% in Mar 2015. Patient was recently started on Phentermine and has had a 4-5 # weight loss over the last month. Patient denies any symptoms of reactive hypoglycemia, diabetic polys, paresthesias or visual blurring.   Further, Patient has history of Vitamin D Deficiency with last vitamin D of 42 in Oct 2014. Patient supplements vitamin D without any suspected side-effects.  Current Outpatient Prescriptions on File Prior to Visit  Medication Sig Dispense Refill  . Ascorbic Acid (VITAMIN C PO) 500 mg 2 (two) times daily.      Marland Kitchen aspirin 325 MG tablet Take 325 mg by mouth daily.      . Cholecalciferol (VITAMIN D PO) Take 4,000 Int'l Units by mouth.      . estradiol (ESTRACE) 0.5 MG tablet Take 0.5 mg by mouth at bedtime.      . fexofenadine (ALLEGRA) 180 MG tablet Take 180 mg by mouth daily.      . pravastatin (PRAVACHOL) 40 MG  tablet Take 40 mg by mouth daily.      . progesterone (PROMETRIUM) 100 MG capsule Take 100 mg by mouth daily.      . rizatriptan (MAXALT) 10 MG tablet Take 10 mg by mouth as needed for migraine. May repeat in 2 hours if needed        Allergies  Allergen Reactions  . Augmentin [Amoxicillin-Pot Clavulanate] Diarrhea  . Prozac [Fluoxetine Hcl]     Disoriented  . Wellbutrin [Bupropion]     Insomnia/Nervousness   PMHx:   Past Medical History  Diagnosis Date  . Hyperlipidemia   . Hypertension   . Prediabetes   . Migraines   . Allergy   . Anemia   . Anxiety   . Depression   . Lumbar stenosis    FHx:    Reviewed / unchanged  SHx:    Reviewed / unchanged   Systems Review: Constitutional: Denies fever, chills, wt changes, headaches, insomnia, fatigue, night sweats, change in appetite. Eyes: Denies redness, blurred vision, diplopia, discharge, itchy, watery eyes.  ENT: Denies discharge, congestion, post nasal drip, epistaxis, sore throat, earache, hearing loss, dental pain, tinnitus, vertigo, sinus pain, snoring.  CV: Denies chest pain, palpitations, irregular heartbeat, syncope, dyspnea, diaphoresis, orthopnea, PND, claudication, edema. Respiratory: denies cough, dyspnea, DOE, pleurisy, hoarseness, laryngitis, wheezing.  Gastrointestinal: Denies dysphagia, odynophagia, heartburn, reflux, water brash, abdominal pain or cramps, nausea, vomiting, bloating, diarrhea, constipation, hematemesis,  melena, hematochezia,  or hemorrhoids. Genitourinary: Denies dysuria, frequency, urgency, nocturia, hesitancy, discharge, hematuria, flank pain. Musculoskeletal: Denies arthralgias, myalgias, stiffness, jt. swelling, pain, limp, strain/sprain.  Skin: Denies pruritus, rash, hives, warts, acne, eczema, change in skin lesion(s). Neuro: No weakness, tremor, incoordination, spasms, paresthesia, or pain. Psychiatric: Denies confusion, memory loss, or sensory loss. Endo: Denies change in weight, skin, hair  change.  Heme/Lymph: No excessive bleeding, bruising, orenlarged lymph nodes.   Exam:  BP 132/76  Pulse 76  Temp(Src) 97.5 F (36.4 C) (Temporal)  Resp 18  Ht 5' 2.5" (1.588 m)  Wt 153 lb (69.4 kg)  BMI 27.52 kg/m2  Appears well nourished - in no distress. Eyes: PERRLA, EOMs, conjunctiva no swelling or erythema. Sinuses: No frontal/maxillary tenderness ENT/Mouth: EAC's clear, TM's nl w/o erythema, bulging. Nares clear w/o erythema, swelling, exudates. Oropharynx clear without erythema or exudates. Oral hygiene is good. Tongue normal, non obstructing. Hearing intact.  Neck: Supple. Thyroid nl. Car 2+/2+ without bruits, nodes or JVD. Chest: Respirations nl with BS clear & equal w/o rales, rhonchi, wheezing or stridor.  Cor: Heart sounds normal w/ regular rate and rhythm without sig. murmurs, gallops, clicks, or rubs. Peripheral pulses normal and equal  without edema.  Musculoskeletal: Full ROM all peripheral extremities, joint stability, 5/5 strength, and normal gait.  Skin: Warm, dry without exposed rashes, lesions, ecchymosis apparent.  Neuro: Cranial nerves intact, reflexes equal bilaterally. Sensory-motor testing grossly intact. Tendon reflexes grossly intact.  Pysch: Alert & oriented x 3. Insight and judgement nl & appropriate. No ideations.  Assessment and Plan:  1. Hypertension - Continue monitor blood pressure at home. Continue diet/meds same.  2. Hyperlipidemia - Continue diet/meds, exercise,& lifestyle modifications. Continue monitor periodic cholesterol/liver & renal functions   3. Pre-diabetes - Continue diet, exercise, lifestyle modifications. Monitor appropriate labs.  4. Vitamin D Deficiency - Continue supplementation.  Recommended regular exercise, BP monitoring, weight control, and discussed med and SE's. Stressed the importance in management of depression. Also advised try xanax 0.25 mg x 1/2 tab every morning and take 1 whole tablet at night. Refills given for  Xanax and Phentermine.

## 2013-12-02 ENCOUNTER — Ambulatory Visit (INDEPENDENT_AMBULATORY_CARE_PROVIDER_SITE_OTHER): Payer: Medicare Other | Admitting: Internal Medicine

## 2013-12-02 ENCOUNTER — Encounter: Payer: Self-pay | Admitting: Internal Medicine

## 2013-12-02 VITALS — BP 132/76 | HR 76 | Temp 97.5°F | Resp 18 | Ht 62.5 in | Wt 153.0 lb

## 2013-12-02 DIAGNOSIS — E785 Hyperlipidemia, unspecified: Secondary | ICD-10-CM | POA: Diagnosis not present

## 2013-12-02 DIAGNOSIS — R7303 Prediabetes: Secondary | ICD-10-CM

## 2013-12-02 DIAGNOSIS — R7309 Other abnormal glucose: Secondary | ICD-10-CM | POA: Diagnosis not present

## 2013-12-02 DIAGNOSIS — Z79899 Other long term (current) drug therapy: Secondary | ICD-10-CM | POA: Diagnosis not present

## 2013-12-02 DIAGNOSIS — I10 Essential (primary) hypertension: Secondary | ICD-10-CM | POA: Diagnosis not present

## 2013-12-02 MED ORDER — PHENTERMINE HCL 37.5 MG PO TABS
37.5000 mg | ORAL_TABLET | Freq: Every day | ORAL | Status: DC
Start: 2013-12-02 — End: 2014-02-04

## 2013-12-02 MED ORDER — ALPRAZOLAM 0.25 MG PO TABS: 0.2500 mg | ORAL_TABLET | Freq: Three times a day (TID) | ORAL | Status: DC | PRN

## 2013-12-02 NOTE — Patient Instructions (Signed)

## 2014-01-01 ENCOUNTER — Ambulatory Visit: Payer: Self-pay | Admitting: Internal Medicine

## 2014-01-02 ENCOUNTER — Other Ambulatory Visit: Payer: Self-pay | Admitting: Internal Medicine

## 2014-01-02 ENCOUNTER — Telehealth: Payer: Self-pay | Admitting: Internal Medicine

## 2014-01-02 MED ORDER — TRAZODONE HCL 150 MG PO TABS: ORAL_TABLET | ORAL | Status: DC

## 2014-01-02 NOTE — Telephone Encounter (Signed)
Patient request sleep medicine.  currently taking alprazolam am and pm, not helping with sleep.   Thank you, Kat[facility] [phone]

## 2014-02-04 ENCOUNTER — Ambulatory Visit (INDEPENDENT_AMBULATORY_CARE_PROVIDER_SITE_OTHER): Payer: Medicare Other | Admitting: Physician Assistant

## 2014-02-04 VITALS — BP 120/72 | HR 72 | Temp 98.1°F | Resp 16 | Wt 147.0 lb

## 2014-02-04 DIAGNOSIS — E785 Hyperlipidemia, unspecified: Secondary | ICD-10-CM | POA: Diagnosis not present

## 2014-02-04 DIAGNOSIS — R7309 Other abnormal glucose: Secondary | ICD-10-CM

## 2014-02-04 DIAGNOSIS — F3289 Other specified depressive episodes: Secondary | ICD-10-CM | POA: Diagnosis not present

## 2014-02-04 DIAGNOSIS — R7303 Prediabetes: Secondary | ICD-10-CM

## 2014-02-04 DIAGNOSIS — F329 Major depressive disorder, single episode, unspecified: Secondary | ICD-10-CM | POA: Diagnosis not present

## 2014-02-04 DIAGNOSIS — E559 Vitamin D deficiency, unspecified: Secondary | ICD-10-CM

## 2014-02-04 DIAGNOSIS — F32A Depression, unspecified: Secondary | ICD-10-CM

## 2014-02-04 DIAGNOSIS — I10 Essential (primary) hypertension: Secondary | ICD-10-CM | POA: Diagnosis not present

## 2014-02-04 DIAGNOSIS — Z79899 Other long term (current) drug therapy: Secondary | ICD-10-CM | POA: Diagnosis not present

## 2014-02-04 DIAGNOSIS — G43909 Migraine, unspecified, not intractable, without status migrainosus: Secondary | ICD-10-CM

## 2014-02-04 MED ORDER — PHENTERMINE HCL 37.5 MG PO TABS: 37.5000 mg | ORAL_TABLET | Freq: Every day | ORAL | Status: DC

## 2014-02-04 MED ORDER — ALPRAZOLAM 0.25 MG PO TABS: 0.2500 mg | ORAL_TABLET | Freq: Three times a day (TID) | ORAL | Status: DC | PRN

## 2014-02-04 MED ORDER — TRAZODONE HCL 150 MG PO TABS: ORAL_TABLET | ORAL | Status: DC
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
## 2014-02-04 NOTE — Patient Instructions (Addendum)
[facility] Phone: [phone]     Bad carbs also include fruit juice, alcohol, and sweet tea. These are empty calories that do not signal to your brain that you are full.   Please remember the good carbs are still carbs which convert into sugar. So please measure them out no more than 1/2-1 cup of rice, oatmeal, pasta, and beans.  Veggies are however free foods! Pile them on.   I like lean protein at every meal such as chicken, [place], pork chops, cottage cheese, etc. Just do not fry these meats and please center your meal around vegetable, the meats should be a side dish.   No all fruit is created equal. Please see the list below, the fruit at the bottom is higher in sugars than the fruit at the top

## 2014-02-05 LAB — CBC WITH DIFFERENTIAL/PLATELET
Basophils Absolute: 0 10*3/uL (ref 0.0–0.1)
Basophils Relative: 0 % (ref 0–1)
Eosinophils Absolute: 0.1 10*3/uL (ref 0.0–0.7)
Eosinophils Relative: 2 % (ref 0–5)
HCT: 41.8 % (ref 36.0–46.0)
HEMOGLOBIN: 14.3 g/dL (ref 12.0–15.0)
LYMPHS ABS: 2.6 10*3/uL (ref 0.7–4.0)
Lymphocytes Relative: 35 % (ref 12–46)
MCH: 30.1 pg (ref 26.0–34.0)
MCHC: 34.2 g/dL (ref 30.0–36.0)
MCV: 88 fL (ref 78.0–100.0)
Monocytes Absolute: 0.6 10*3/uL (ref 0.1–1.0)
Monocytes Relative: 8 % (ref 3–12)
Neutro Abs: 4.1 10*3/uL (ref 1.7–7.7)
Neutrophils Relative %: 55 % (ref 43–77)
PLATELETS: 297 10*3/uL (ref 150–400)
RBC: 4.75 MIL/uL (ref 3.87–5.11)
RDW: 13.6 % (ref 11.5–15.5)
WBC: 7.4 10*3/uL (ref 4.0–10.5)

## 2014-02-05 LAB — LIPID PANEL
CHOL/HDL RATIO: 2.8 ratio
Cholesterol: 130 mg/dL (ref 0–200)
HDL: 47 mg/dL (ref >39)
LDL CALC: 66 mg/dL (ref 0–99)
Triglycerides: 86 mg/dL (ref <150)
VLDL: 17 mg/dL (ref 0–40)

## 2014-02-05 LAB — HEMOGLOBIN A1C
HEMOGLOBIN A1C: 5.6 % (ref <5.7)
Mean Plasma Glucose: 114 mg/dL (ref <117)

## 2014-02-05 LAB — BASIC METABOLIC PANEL WITH GFR
BUN: 8 mg/dL (ref 6–23)
CO2: 26 meq/L (ref 19–32)
Calcium: 9.6 mg/dL (ref 8.4–10.5)
Chloride: 106 mEq/L (ref 96–112)
Creat: 0.76 mg/dL (ref 0.50–1.10)
GFR, Est African American: >89 mL/min
GFR, Est Non African American: 82 mL/min
Glucose, Bld: 86 mg/dL (ref 70–99)
Potassium: 4.1 mEq/L (ref 3.5–5.3)
SODIUM: 143 meq/L (ref 135–145)

## 2014-02-05 LAB — INSULIN, FASTING: Insulin fasting, serum: 21 u[IU]/mL (ref 3–28)

## 2014-02-05 LAB — HEPATIC FUNCTION PANEL
ALK PHOS: 43 U/L (ref 39–117)
ALT: 19 U/L (ref 0–35)
AST: 20 U/L (ref 0–37)
Albumin: 4.5 g/dL (ref 3.5–5.2)
BILIRUBIN DIRECT: 0.1 mg/dL (ref 0.0–0.3)
BILIRUBIN INDIRECT: 0.3 mg/dL (ref 0.2–1.2)
BILIRUBIN TOTAL: 0.4 mg/dL (ref 0.2–1.2)
Total Protein: 6.7 g/dL (ref 6.0–8.3)

## 2014-02-05 LAB — MAGNESIUM: Magnesium: 2.1 mg/dL (ref 1.5–2.5)

## 2014-02-05 LAB — TSH: TSH: 1.939 u[IU]/mL (ref 0.350–4.500)

## 2014-02-07 ENCOUNTER — Ambulatory Visit: Payer: Self-pay | Admitting: Physician Assistant

## 2014-02-11 DIAGNOSIS — M48061 Spinal stenosis, lumbar region without neurogenic claudication: Secondary | ICD-10-CM | POA: Diagnosis not present

## 2014-02-14 ENCOUNTER — Other Ambulatory Visit: Payer: Self-pay | Admitting: Internal Medicine

## 2014-02-22 ENCOUNTER — Other Ambulatory Visit: Payer: Self-pay | Admitting: Internal Medicine

## 2014-02-22 DIAGNOSIS — M47817 Spondylosis without myelopathy or radiculopathy, lumbosacral region: Secondary | ICD-10-CM | POA: Diagnosis not present

## 2014-02-24 ENCOUNTER — Other Ambulatory Visit: Payer: Self-pay | Admitting: Internal Medicine

## 2014-02-24 DIAGNOSIS — M47817 Spondylosis without myelopathy or radiculopathy, lumbosacral region: Secondary | ICD-10-CM | POA: Diagnosis not present

## 2014-02-24 DIAGNOSIS — M48061 Spinal stenosis, lumbar region without neurogenic claudication: Secondary | ICD-10-CM | POA: Diagnosis not present

## 2014-02-25 ENCOUNTER — Ambulatory Visit (INDEPENDENT_AMBULATORY_CARE_PROVIDER_SITE_OTHER): Payer: Medicare Other | Admitting: Internal Medicine

## 2014-02-25 ENCOUNTER — Encounter: Payer: Self-pay | Admitting: Internal Medicine

## 2014-02-25 VITALS — BP 122/84 | HR 72 | Temp 97.7°F | Resp 16 | Ht 62.5 in | Wt 147.2 lb

## 2014-02-25 DIAGNOSIS — M545 Low back pain, unspecified: Secondary | ICD-10-CM | POA: Diagnosis not present

## 2014-02-25 DIAGNOSIS — G43909 Migraine, unspecified, not intractable, without status migrainosus: Secondary | ICD-10-CM | POA: Diagnosis not present

## 2014-02-25 MED ORDER — BACLOFEN 10 MG PO TABS: ORAL_TABLET | ORAL | Status: DC

## 2014-02-25 NOTE — Progress Notes (Signed)
Subjective:    Patient ID: Rebekah Hamilton, female    DOB: 29-Jul-1946, 68 y.o.   MRN: [id number]  HPI Patient presents w/complaints of Bilat  LBP with occas sciatica alternating between side occuring more with ambulation than at rest. She has been referred by Dr Durward Fortes for EDSI's. Recent MRI per [facility] showed Lumbar canal & foraminal stenosis. Patient relates 1 episode of severe Lt mid to upper back pain lasting several hours with no reoccurance  X 4 days. She reports ongoing soreness of the Bilat mid to lower SI area of the back.    Medication List       This list is accurate as of: 02/25/14  6:44 PM.  Always use your most recent med list.               ALLEGRA 180 MG tablet  Generic drug:  fexofenadine  Take 180 mg by mouth daily.     ALPRAZolam 0.25 MG tablet  Commonly known as:  XANAX  Take 1 tablet (0.25 mg total) by mouth 3 (three) times daily as needed.     aspirin 325 MG tablet  Take 325 mg by mouth daily.     baclofen 10 MG tablet  Commonly known as:  LIORESAL  Take 1/2 to 1 tablet 2 or 3 x day as needed for muscle spasm     estradiol 0.5 MG tablet  Commonly known as:  ESTRACE  TAKE 1 TABLET BY MOUTH EVERY DAY     LODINE PO  Take by mouth. Taking per orthopedist     phentermine 37.5 MG tablet  Commonly known as:  ADIPEX-P  Take 1 tablet (37.5 mg total) by mouth daily before breakfast.     pravastatin 40 MG tablet  Commonly known as:  PRAVACHOL  Take 40 mg by mouth daily.     progesterone 100 MG capsule  Commonly known as:  PROMETRIUM  TAKE ONE CAPSULE BY MOUTH EVERY DAY     rizatriptan 10 MG tablet  Commonly known as:  MAXALT  Take 10 mg by mouth as needed for migraine. May repeat in 2 hours if needed     traZODone 150 MG tablet  Commonly known as:  DESYREL  TAKE 1/3-1/2-1 TABLET BY MOUTH 1 HOUR PRIOR TO BEDTIME AS NEEDED FOR SLEEP     VITAMIN C PO  500 mg 2 (two) times daily.     VITAMIN D PO  Take 4,000 Int'l Units by mouth.        Allergies  Allergen Reactions  . Augmentin [Amoxicillin-Pot Clavulanate] Diarrhea  . Prozac [Fluoxetine Hcl]     Disoriented  . Wellbutrin [Bupropion]     Insomnia/Nervousness   Past Medical History  Diagnosis Date  . Hyperlipidemia   . Hypertension   . Prediabetes   . Migraines   . Allergy   . Anemia   . Anxiety   . Depression   . Lumbar stenosis    Review of Systems In addition to the HPI above,  No Fever-chills,  No Headache, No changes with Vision or hearing,  No problems swallowing food or Liquids,  No Chest pain or productive Cough or Shortness of Breath,  No Abdominal pain, No Nausea or Vomitting, Bowel movements are regular,  No Blood in stool or Urine,  No dysuria,  No new skin rashes or bruises,  No new joints pains-aches,  No new weakness, tingling, numbness in any extremity,  No recent weight loss,  No polyuria, polydypsia  or polyphagia,  No significant Mental Stressors.  A full 10 point Review of Systems was done, except as stated above, all other Review of Systems were negative  Objective:   Physical Exam  BP 122/84  Pulse 72  Temp(Src) 97.7 F (36.5 C) (Temporal)  Resp 16  Ht 5' 2.5" (1.588 m)  Wt 147 lb 3.2 oz (66.769 kg)  BMI 26.48 kg/m2  HEENT - Eac's patent. TM's Nl. EOM's full. PERRLA. NasoOroPharynx clear. Neck - supple. Nl Thyroid. Carotids 2+ & No bruits, nodes, JVD Chest - Clear equal BS w/o Rales, rhonchi, wheezes. Cor - Nl HS. RRR w/o sig MGR. PP 1(+). No edema. Abd - No palpable organomegaly, masses or tenderness. BS nl. MS- FROM w/o deformities. Moderate paralumbar tender spasm extending from the mid back to the lower lumbar area.Neg SLR . DTRs are guarded.. Neuro - No obvious Cr N abnormalities. Sensory, motor and Cerebellar functions appear Nl w/o focal abnormalities. Psyche - Mental status normal & appropriate.  No delusions, ideations or obvious mood abnormalities.  Assessment & Plan:   1. Bilateral low back pain, with  sciatica presence unspecified  - baclofen (LIORESAL) 10 MG tablet; Take 1/2 to 1 tablet 2 or 3 x day as needed for muscle spasm  Dispense: 60 tablet; Refill: 99 - recc trial with heating pad.

## 2014-02-25 NOTE — Patient Instructions (Signed)
Spinal Stenosis Spinal stenosis is when the open spaces between the bones of your spine (vertebrae) get smaller (narrow). It is caused by bone pushing on the open spaces of your spine. This puts pressure on your spine and the nerves in your spine. You may be given medicine to lessen the puffiness (inflammation) in your nerves. Other times, surgery is needed. HOME CARE  Change positions when you sit, stand, and lie. This can help take pressure off your nerves.  Do exercises as told by your doctor to strengthen the middle part of your body.  Lose weight if your doctor suggests it. This takes pressure off your spine.  Take all medicine as told by your doctor. MAKE SURE YOU:  Understand these instructions.  Will watch your condition.  Will get help right away if you are not doing well or get worse. Document Released: 11/25/2010 Document Revised: 04/03/2013 Document Reviewed: 11/09/2012 [facility] Patient Information 2015 [place], [place]. This information is not intended to replace advice given to you by your health care provider. Make sure you discuss any questions you have with your health care provider.   Spinal Stenosis Spinal stenosis is an abnormal narrowing of the canals of your spine (vertebrae). CAUSES  Spinal stenosis is caused by areas of bone pushing into the central canals of your vertebrae. This condition can be present at birth (congenital). It also may be caused by arthritic deterioration of your vertebrae (spinal degeneration).  SYMPTOMS   Pain that is generally worse with activities, particularly standing and walking.  Numbness, tingling, hot or cold sensations, weakness, or weariness in your legs.  Frequent episodes of falling.  A foot-slapping gait that leads to muscle weakness. DIAGNOSIS  Spinal stenosis is diagnosed with the use of magnetic resonance imaging (MRI) or computed tomography (CT). TREATMENT  Initial therapy for spinal stenosis focuses on the management  of the pain and other symptoms associated with the condition. These therapies include:  Practicing postural changes to lessen pressure on your nerves.  Exercises to strengthen the core of your body.  Loss of excess body weight.  The use of nonsteroidal anti-inflammatory medications to reduce swelling and inflammation in your nerves. When therapies to manage pain are not successful, surgery to treat spinal stenosis may be recommended. This surgery involves removing excess bone, which puts pressure on your nerve roots. During this surgery (laminectomy), the posterior boney arch (lamina) and excess bone around the facet joints are removed. Document Released: 10/22/2003 Document Revised: 11/26/2012 Document Reviewed: 11/09/2012 [facility] Patient Information 2015 [place], [place]. This information is not intended to replace advice given to you by your health care provider. Make sure you discuss any questions you have with your health care provider.

## 2014-03-20 ENCOUNTER — Ambulatory Visit (INDEPENDENT_AMBULATORY_CARE_PROVIDER_SITE_OTHER): Payer: Medicare Other | Admitting: Physician Assistant

## 2014-03-20 ENCOUNTER — Encounter: Payer: Self-pay | Admitting: Physician Assistant

## 2014-03-20 VITALS — BP 148/80 | HR 72 | Temp 97.7°F | Resp 16 | Ht 62.5 in | Wt 143.0 lb

## 2014-03-20 DIAGNOSIS — G43009 Migraine without aura, not intractable, without status migrainosus: Secondary | ICD-10-CM

## 2014-03-20 DIAGNOSIS — F411 Generalized anxiety disorder: Secondary | ICD-10-CM | POA: Diagnosis not present

## 2014-03-20 DIAGNOSIS — R002 Palpitations: Secondary | ICD-10-CM | POA: Diagnosis not present

## 2014-03-20 DIAGNOSIS — D509 Iron deficiency anemia, unspecified: Secondary | ICD-10-CM

## 2014-03-20 DIAGNOSIS — Z79899 Other long term (current) drug therapy: Secondary | ICD-10-CM

## 2014-03-20 DIAGNOSIS — F419 Anxiety disorder, unspecified: Secondary | ICD-10-CM

## 2014-03-20 DIAGNOSIS — F3289 Other specified depressive episodes: Secondary | ICD-10-CM

## 2014-03-20 DIAGNOSIS — F32A Depression, unspecified: Secondary | ICD-10-CM

## 2014-03-20 DIAGNOSIS — F329 Major depressive disorder, single episode, unspecified: Secondary | ICD-10-CM | POA: Diagnosis not present

## 2014-03-20 LAB — IRON AND TIBC
%SAT: 24 % (ref 20–55)
IRON: 79 ug/dL (ref 42–145)
TIBC: 332 ug/dL (ref 250–470)
UIBC: 253 ug/dL (ref 125–400)

## 2014-03-20 LAB — CBC WITH DIFFERENTIAL/PLATELET
Basophils Absolute: 0 10*3/uL (ref 0.0–0.1)
Basophils Relative: 0 % (ref 0–1)
Eosinophils Absolute: 0.2 10*3/uL (ref 0.0–0.7)
Eosinophils Relative: 2 % (ref 0–5)
HCT: 43.5 % (ref 36.0–46.0)
Hemoglobin: 14.7 g/dL (ref 12.0–15.0)
LYMPHS ABS: 3 10*3/uL (ref 0.7–4.0)
Lymphocytes Relative: 34 % (ref 12–46)
MCH: 30.1 pg (ref 26.0–34.0)
MCHC: 33.8 g/dL (ref 30.0–36.0)
MCV: 89 fL (ref 78.0–100.0)
Monocytes Absolute: 0.8 10*3/uL (ref 0.1–1.0)
Monocytes Relative: 9 % (ref 3–12)
Neutro Abs: 4.9 10*3/uL (ref 1.7–7.7)
Neutrophils Relative %: 55 % (ref 43–77)
Platelets: 315 10*3/uL (ref 150–400)
RBC: 4.89 MIL/uL (ref 3.87–5.11)
RDW: 13.3 % (ref 11.5–15.5)
WBC: 8.9 10*3/uL (ref 4.0–10.5)

## 2014-03-20 LAB — BASIC METABOLIC PANEL WITH GFR
BUN: 8 mg/dL (ref 6–23)
CO2: 31 meq/L (ref 19–32)
Calcium: 9.9 mg/dL (ref 8.4–10.5)
Chloride: 104 mEq/L (ref 96–112)
Creat: 0.79 mg/dL (ref 0.50–1.10)
GFR, Est African American: >89 mL/min
GFR, Est Non African American: 78 mL/min
Glucose, Bld: 96 mg/dL (ref 70–99)
Potassium: 4.7 mEq/L (ref 3.5–5.3)
SODIUM: 142 meq/L (ref 135–145)

## 2014-03-20 LAB — HEPATIC FUNCTION PANEL
ALT: 28 U/L (ref 0–35)
AST: 24 U/L (ref 0–37)
Albumin: 4.5 g/dL (ref 3.5–5.2)
Alkaline Phosphatase: 46 U/L (ref 39–117)
BILIRUBIN DIRECT: 0.1 mg/dL (ref 0.0–0.3)
BILIRUBIN TOTAL: 0.5 mg/dL (ref 0.2–1.2)
Indirect Bilirubin: 0.4 mg/dL (ref 0.2–1.2)
Total Protein: 6.6 g/dL (ref 6.0–8.3)

## 2014-03-20 LAB — TSH: TSH: 2.638 u[IU]/mL (ref 0.350–4.500)

## 2014-03-20 LAB — MAGNESIUM: Magnesium: 2.2 mg/dL (ref 1.5–2.5)

## 2014-03-20 MED ORDER — VERAPAMIL HCL 80 MG PO TABS: 80.0000 mg | ORAL_TABLET | Freq: Three times a day (TID) | ORAL | Status: DC

## 2014-03-20 NOTE — Progress Notes (Signed)
   Subjective:    Patient ID: Rebekah Hamilton, female    DOB: May 09, 1946, 68 y.o.   MRN: [id number]  HPI 68 y.o. female with multiple medical complaints with history of anxiety presents with headache, palpitations, and nervousness. She has been tried on several SSRI, the latest being Brintellix and has not been able to tolerate them. She has been very anxious, waking up in the night with palpitations and headache. She was given phentermine which she states she take 1/2 and feels better. She has a history of migraines, most recently she woke up Sunday and worse through out the day, will switch sides, no meds for it today but has taken maxalt which helps.   She continues to have problems at home with her husband, no physical threat, talking with a Chief Executive Officer.    Review of Systems  Constitutional: Positive for fatigue. Negative for fever, chills, diaphoresis, activity change, appetite change and unexpected weight change.  Respiratory: Negative.   Cardiovascular: Positive for palpitations. Negative for chest pain and leg swelling.  Gastrointestinal: Negative.   Genitourinary: Negative.   Musculoskeletal: Positive for myalgias. Negative for arthralgias, back pain, gait problem, joint swelling, neck pain and neck stiffness.  Neurological: Positive for headaches. Negative for dizziness, tremors, seizures, syncope, facial asymmetry, speech difficulty, weakness, light-headedness and numbness.  Psychiatric/Behavioral: Positive for sleep disturbance and dysphoric mood. Negative for suicidal ideas, hallucinations and self-injury. The patient is nervous/anxious. The patient is not hyperactive.        Objective:   Physical Exam  Constitutional: She is oriented to person, place, and time. She appears well-developed and well-nourished.  HENT:  Head: Normocephalic and atraumatic.  Right Ear: External ear normal.  Left Ear: External ear normal.  Mouth/Throat: Oropharynx is clear and moist.  Eyes:  Conjunctivae and EOM are normal. Pupils are equal, round, and reactive to light.  Neck: Normal range of motion. Neck supple. No thyromegaly present.  Cardiovascular: Normal rate, regular rhythm and normal heart sounds.  Exam reveals no gallop and no friction rub.   No murmur heard. Pulmonary/Chest: Effort normal and breath sounds normal. No respiratory distress. She has no wheezes.  Abdominal: Soft. Bowel sounds are normal. She exhibits no distension and no mass. There is no tenderness. There is no rebound and no guarding.  Musculoskeletal: Normal range of motion.  Lymphadenopathy:    She has no cervical adenopathy.  Neurological: She is alert and oriented to person, place, and time. She displays normal reflexes. No cranial nerve deficit. Coordination normal.  Skin: Skin is warm and dry.  Psychiatric: She has a normal mood and affect.       Assessment & Plan:  Palpitations- check labs, r/u anemia, TSH, dehydration, electrolytes imbalance Check EKG, PPI for 2 weeks,try verapamil at night, ? Stress related Increase water, decrease alcohol/caffeine, valsalva maneuvers taught Follow up in 4 weeks  Anxiet/depression without SI/HI - try verapamil and take xanax as needed, strongly encouraged to see psychatrist, declines SSRI at this time  Migraine with normal neuro- get on verapamil, continue maxalt PRN, if worsening HA, changes vision/speech, imbalance, weakness go to the ER

## 2014-03-20 NOTE — Patient Instructions (Signed)
Please make an appointment with a psychiatrist  Please start on the verapamil 80mg  1 at night for 3-5 days, then you can do 1 at night and 1 around dinner time for 3-5 days, then you can stay there or do 1 pill 3 times a day Call if you have any problems.   Palpitations A palpitation is the feeling that your heartbeat is irregular or is faster than normal. It may feel like your heart is fluttering or skipping a beat. Palpitations are usually not a serious problem. However, in some cases, you may need further medical evaluation. CAUSES  Palpitations can be caused by:  Smoking.  Caffeine or other stimulants, such as diet pills or energy drinks.  Alcohol.  Stress and anxiety.  Strenuous physical activity.  Fatigue.  Certain medicines.  Heart disease, especially if you have a history of irregular heart rhythms (arrhythmias), such as atrial fibrillation, atrial flutter, or supraventricular tachycardia.  An improperly working pacemaker or defibrillator. DIAGNOSIS  To find the cause of your palpitations, your health care provider will take your medical history and perform a physical exam. Your health care provider may also have you take a test called an ambulatory electrocardiogram (ECG). An ECG records your heartbeat patterns over a 24-hour period. You may also have other tests, such as:  Transthoracic echocardiogram (TTE). During echocardiography, sound waves are used to evaluate how blood flows through your heart.  Transesophageal echocardiogram (TEE).  Cardiac monitoring. This allows your health care provider to monitor your heart rate and rhythm in real time.  Holter monitor. This is a portable device that records your heartbeat and can help diagnose heart arrhythmias. It allows your health care provider to track your heart activity for several days, if needed.  Stress tests by exercise or by giving medicine that makes the heart beat faster. TREATMENT  Treatment of palpitations  depends on the cause of your symptoms and can vary greatly. Most cases of palpitations do not require any treatment other than time, relaxation, and monitoring your symptoms. Other causes, such as atrial fibrillation, atrial flutter, or supraventricular tachycardia, usually require further treatment. HOME CARE INSTRUCTIONS   Avoid:  Caffeinated coffee, tea, soft drinks, diet pills, and energy drinks.  Chocolate.  Alcohol.  Stop smoking if you smoke.  Reduce your stress and anxiety. Things that can help you relax include:  A method of controlling things in your body, such as your heartbeats, with your mind (biofeedback).  Yoga.  Meditation.  Physical activity such as swimming, jogging, or walking.  Get plenty of rest and sleep. SEEK MEDICAL CARE IF:   You continue to have a fast or irregular heartbeat beyond 24 hours.  Your palpitations occur more often. SEEK IMMEDIATE MEDICAL CARE IF:  You have chest pain or shortness of breath.  You have a severe headache.  You feel dizzy or you faint. MAKE SURE YOU:  Understand these instructions.  Will watch your condition.  Will get help right away if you are not doing well or get worse. Document Released: 07/29/2000 Document Revised: 08/06/2013 Document Reviewed: 09/30/2011 [facility] Patient Information 2015 [place], [place]. This information is not intended to replace advice given to you by your health care provider. Make sure you discuss any questions you have with your health care provider.

## 2014-04-18 DIAGNOSIS — H2510 Age-related nuclear cataract, unspecified eye: Secondary | ICD-10-CM | POA: Diagnosis not present

## 2014-05-29 ENCOUNTER — Ambulatory Visit (INDEPENDENT_AMBULATORY_CARE_PROVIDER_SITE_OTHER): Payer: Medicare Other | Admitting: Physician Assistant

## 2014-05-29 ENCOUNTER — Encounter: Payer: Self-pay | Admitting: Physician Assistant

## 2014-05-29 VITALS — BP 120/72 | HR 72 | Temp 97.7°F | Resp 16 | Ht 62.5 in | Wt 144.0 lb

## 2014-05-29 DIAGNOSIS — M48061 Spinal stenosis, lumbar region without neurogenic claudication: Secondary | ICD-10-CM

## 2014-05-29 DIAGNOSIS — Z79899 Other long term (current) drug therapy: Secondary | ICD-10-CM

## 2014-05-29 DIAGNOSIS — E559 Vitamin D deficiency, unspecified: Secondary | ICD-10-CM

## 2014-05-29 DIAGNOSIS — F32A Depression, unspecified: Secondary | ICD-10-CM

## 2014-05-29 DIAGNOSIS — R7303 Prediabetes: Secondary | ICD-10-CM

## 2014-05-29 DIAGNOSIS — Z23 Encounter for immunization: Secondary | ICD-10-CM | POA: Diagnosis not present

## 2014-05-29 DIAGNOSIS — D649 Anemia, unspecified: Secondary | ICD-10-CM

## 2014-05-29 DIAGNOSIS — G43709 Chronic migraine without aura, not intractable, without status migrainosus: Secondary | ICD-10-CM

## 2014-05-29 DIAGNOSIS — R7309 Other abnormal glucose: Secondary | ICD-10-CM

## 2014-05-29 DIAGNOSIS — I10 Essential (primary) hypertension: Secondary | ICD-10-CM

## 2014-05-29 DIAGNOSIS — E785 Hyperlipidemia, unspecified: Secondary | ICD-10-CM

## 2014-05-29 DIAGNOSIS — F419 Anxiety disorder, unspecified: Secondary | ICD-10-CM

## 2014-05-29 DIAGNOSIS — T7840XD Allergy, unspecified, subsequent encounter: Secondary | ICD-10-CM

## 2014-05-29 DIAGNOSIS — F329 Major depressive disorder, single episode, unspecified: Secondary | ICD-10-CM

## 2014-05-29 LAB — HEPATIC FUNCTION PANEL
ALK PHOS: 47 U/L (ref 39–117)
ALT: 21 U/L (ref 0–35)
AST: 22 U/L (ref 0–37)
Albumin: 4.3 g/dL (ref 3.5–5.2)
BILIRUBIN TOTAL: 0.4 mg/dL (ref 0.2–1.2)
Bilirubin, Direct: 0.1 mg/dL (ref 0.0–0.3)
Indirect Bilirubin: 0.3 mg/dL (ref 0.2–1.2)
TOTAL PROTEIN: 6.5 g/dL (ref 6.0–8.3)

## 2014-05-29 LAB — BASIC METABOLIC PANEL WITH GFR
BUN: 13 mg/dL (ref 6–23)
CHLORIDE: 103 meq/L (ref 96–112)
CO2: 28 meq/L (ref 19–32)
CREATININE: 0.94 mg/dL (ref 0.50–1.10)
Calcium: 9.7 mg/dL (ref 8.4–10.5)
GFR, Est African American: 73 mL/min
GFR, Est Non African American: 63 mL/min
GLUCOSE: 81 mg/dL (ref 70–99)
Potassium: 4.9 mEq/L (ref 3.5–5.3)
Sodium: 139 mEq/L (ref 135–145)

## 2014-05-29 LAB — CBC WITH DIFFERENTIAL/PLATELET
Basophils Absolute: 0 10*3/uL (ref 0.0–0.1)
Basophils Relative: 0 % (ref 0–1)
EOS PCT: 1 % (ref 0–5)
Eosinophils Absolute: 0.1 10*3/uL (ref 0.0–0.7)
HEMATOCRIT: 43.2 % (ref 36.0–46.0)
Hemoglobin: 15 g/dL (ref 12.0–15.0)
LYMPHS ABS: 2 10*3/uL (ref 0.7–4.0)
Lymphocytes Relative: 25 % (ref 12–46)
MCH: 30.9 pg (ref 26.0–34.0)
MCHC: 34.7 g/dL (ref 30.0–36.0)
MCV: 88.9 fL (ref 78.0–100.0)
MONO ABS: 0.6 10*3/uL (ref 0.1–1.0)
Monocytes Relative: 8 % (ref 3–12)
Neutro Abs: 5.3 10*3/uL (ref 1.7–7.7)
Neutrophils Relative %: 66 % (ref 43–77)
Platelets: 326 10*3/uL (ref 150–400)
RBC: 4.86 MIL/uL (ref 3.87–5.11)
RDW: 13.6 % (ref 11.5–15.5)
WBC: 8 10*3/uL (ref 4.0–10.5)

## 2014-05-29 LAB — LIPID PANEL
CHOLESTEROL: 143 mg/dL (ref 0–200)
HDL: 55 mg/dL (ref >39)
LDL Cholesterol: 73 mg/dL (ref 0–99)
TRIGLYCERIDES: 77 mg/dL (ref <150)
Total CHOL/HDL Ratio: 2.6 Ratio
VLDL: 15 mg/dL (ref 0–40)

## 2014-05-29 LAB — MAGNESIUM: Magnesium: 2.1 mg/dL (ref 1.5–2.5)

## 2014-05-29 LAB — HEMOGLOBIN A1C
Hgb A1c MFr Bld: 5.8 % — ABNORMAL HIGH (ref <5.7)
MEAN PLASMA GLUCOSE: 120 mg/dL — AB (ref <117)

## 2014-05-29 LAB — TSH: TSH: 1.663 u[IU]/mL (ref 0.350–4.500)

## 2014-05-29 MED ORDER — ALPRAZOLAM 0.25 MG PO TABS: 0.2500 mg | ORAL_TABLET | Freq: Three times a day (TID) | ORAL | Status: DC | PRN

## 2014-05-29 MED ORDER — RIZATRIPTAN BENZOATE 10 MG PO TABS: 10.0000 mg | ORAL_TABLET | ORAL | Status: DC | PRN

## 2014-05-29 MED ORDER — PHENTERMINE HCL 37.5 MG PO TABS: 37.5000 mg | ORAL_TABLET | Freq: Every day | ORAL | Status: DC

## 2014-05-29 MED ORDER — SERTRALINE HCL 100 MG PO TABS: 100.0000 mg | ORAL_TABLET | Freq: Every day | ORAL | Status: DC

## 2014-05-29 MED ORDER — PRAVASTATIN SODIUM 40 MG PO TABS: 40.0000 mg | ORAL_TABLET | Freq: Every day | ORAL | Status: DC

## 2014-05-29 NOTE — Patient Instructions (Signed)

## 2014-05-29 NOTE — Progress Notes (Signed)
Complete Physical  Assessment and Plan: 1. Essential hypertension - CBC with Differential - BASIC METABOLIC PANEL WITH GFR - Hepatic function panel - TSH - Urinalysis, Routine w reflex microscopic - Microalbumin / creatinine urine ratio  2. Chronic migraine without aura without status migrainosus, not intractable Try the verapamil, can increase to two a day. Avoid triggers  3. Prediabetes Discussed general issues about diabetes pathophysiology and management., Educational material distributed., Suggested low cholesterol diet., Encouraged aerobic exercise., Discussed foot care., Reminded to get yearly retinal exam. - Hemoglobin A1c - HM DIABETES FOOT EXAM  4. Hyperlipidemia - Lipid panel  5. Allergy, subsequent encounter Continue medication  6. Anemia, unspecified anemia type Check cbc  7. Anxiety Continue zoloft and counseling  8. Depression continue medications, stress management techniques discussed, increase water, good sleep hygiene discussed, increase exercise, and increase veggies.   9. Lumbar stenosis Continue follow up with Dr. Durward Fortes  40. Encounter for long-term (current) use of medications - Magnesium  11. Need for prophylactic vaccination and inoculation against influenza - Flu vaccine HIGH DOSE PF  12. Vitamin D deficiency Continue supplement   Discussed med's effects and SE's. Screening labs and tests as requested with regular follow-up as recommended.  HPI 68 y.o. female  presents for a complete physical.  Her blood pressure has been controlled at home, today their BP is BP: 120/72 mmHg She does not workout. She denies chest pain, shortness of breath, dizziness.  She is on cholesterol medication and denies myalgias. Her cholesterol is at goal. The cholesterol last visit was:   Lab Results  Component Value Date   CHOL 130 02/04/2014   HDL 47 02/04/2014   LDLCALC 66 02/04/2014   TRIG 86 02/04/2014   CHOLHDL 2.8 02/04/2014    She has been working  on diet and exercise for prediabetes, she has lost weight with phentermine and states it has helped with her energy the most, we will start to taper her off of this. She denies paresthesia of the feet, polydipsia and polyuria. Last A1C in the office was:  Lab Results  Component Value Date   HGBA1C 5.6 02/04/2014   Patient is on Vitamin D supplement.   She has anxiety and insomnia, she has been having trouble with her husband and is finally seeing a psychiatrist and has started on zoloft which she states has helped.  She was having palpitations but is taking 1 verapamil at night which has helped in addition to the stress management.  She has a history of migraines, takes maxalt PRN which helps.  She is on Estrogen and a BASA.  Wt Readings from Last 3 Encounters:  05/29/14 144 lb (65.318 kg)  03/20/14 143 lb (64.864 kg)  02/25/14 147 lb 3.2 oz (66.769 kg)     Current Medications:  Current Outpatient Prescriptions on File Prior to Visit  Medication Sig Dispense Refill  . ALPRAZolam (XANAX) 0.25 MG tablet Take 1 tablet (0.25 mg total) by mouth 3 (three) times daily as needed.  90 tablet  1  . Ascorbic Acid (VITAMIN C PO) 500 mg 2 (two) times daily.      Marland Kitchen aspirin 325 MG tablet Take 325 mg by mouth daily.      . baclofen (LIORESAL) 10 MG tablet Take 1/2 to 1 tablet 2 or 3 x day as needed for muscle spasm  60 tablet  99  . Cholecalciferol (VITAMIN D PO) Take 4,000 Int'l Units by mouth.      . estradiol (ESTRACE) 0.5 MG  tablet TAKE 1 TABLET BY MOUTH EVERY DAY  90 tablet  3  . Etodolac (LODINE PO) Take by mouth. Taking per orthopedist      . fexofenadine (ALLEGRA) 180 MG tablet Take 180 mg by mouth daily.      . phentermine (ADIPEX-P) 37.5 MG tablet Take 1 tablet (37.5 mg total) by mouth daily before breakfast.  30 tablet  2  . pravastatin (PRAVACHOL) 40 MG tablet Take 40 mg by mouth daily.      . progesterone (PROMETRIUM) 100 MG capsule TAKE ONE CAPSULE BY MOUTH EVERY DAY  90 capsule  3  .  rizatriptan (MAXALT) 10 MG tablet Take 10 mg by mouth as needed for migraine. May repeat in 2 hours if needed      . traZODone (DESYREL) 150 MG tablet TAKE 1/3-1/2-1 TABLET BY MOUTH 1 HOUR PRIOR TO BEDTIME AS NEEDED FOR SLEEP  30 tablet  1  . verapamil (CALAN) 80 MG tablet Take 1 tablet (80 mg total) by mouth 3 (three) times daily.  90 tablet  1   No current facility-administered medications on file prior to visit.   Health Maintenance:   Immunization History  Administered Date(s) Administered  . Influenza,inj,quad, With Preservative 08/12/2013  . Pneumococcal Polysaccharide-23 05/29/2013  . Td 08/15/2005   Last colonoscopy: 2006- due next year  Last mammogram: 08/2013  Last pap smear/pelvic exam: 2014 neg  DEXA: 2000- gets MRI for back so declines   Prior vaccinations:  TD or Tdap: 2007  Influenza: 2014 DUE Pneumococcal: 2014  Prevnar; DUE Shingles/Zostavax: check price  Names of Other Physician/Practitioners you currently use:  1. [facility]- here for primary care  2. [facility], eye doctor, last visit Aug 2014, beginning [place]  3. ,Dr. Evelene Croon dentist, last visit 06/2013  4. Dr. Jannifer Franklin  5. Dr. Durward Fortes  6. [facility], unknown which doctor 7. [facility]  Allergies:  Allergies  Allergen Reactions  . Augmentin [Amoxicillin-Pot Clavulanate] Diarrhea  . Prozac [Fluoxetine Hcl]     Disoriented  . Wellbutrin [Bupropion]     Insomnia/Nervousness   Medical History:  Past Medical History  Diagnosis Date  . Hyperlipidemia   . Hypertension   . Prediabetes   . Migraines   . Allergy   . Anemia   . Anxiety   . Depression   . Lumbar stenosis    Surgical History:  Past Surgical History  Procedure Laterality Date  . Cholecystectomy  1999   Family History:  Family History  Problem Relation Age of Onset  . Stroke Mother   . Hypertension Mother   . Heart attack Father    Social History:  History  Substance Use  Topics  . Smoking status: Never Smoker   . Smokeless tobacco: Not on file  . Alcohol Use: No     Comment: Rare-Wine     Review of Systems: [X]  = complains of  [ ]  = denies  General: Fatigue [ ]  Fever [ ]  Chills [ ]  Weakness [ ]   Insomnia [ ] Weight change [ ]  Night sweats [ ]   Change in appetite [ ]  Eyes: Redness [ ]  Blurred vision [ ]  Diplopia [ ]  Discharge [ ]   ENT: Congestion [ ]  Sinus Pain [ ]  Post Nasal Drip [ ]  Sore Throat [ ]  Earache [ ]  hearing loss [ ]  Tinnitus [ ]  Snoring [ ]   Cardiac: Chest pain/pressure [ ]  SOB [ ]  Orthopnea [ ]   Palpitations [ ]   Paroxysmal  nocturnal dyspnea[ ]  Claudication [ ]  Edema [ ]   Pulmonary: Cough [ ]  Wheezing[ ]   SOB [ ]   Pleurisy [ ]   GI: Nausea [ ]  Vomiting[ ]  Dysphagia[ ]  Heartburn[ ]  Abdominal pain [ ]  Constipation [ ] ; Diarrhea [ ]  BRBPR [ ]  Melena[ ]  Bloating [ ]  Hemorrhoids [ ]   GU: Hematuria[ ]  Dysuria [ ]  Nocturia[ ]  Urgency [ ]   Hesitancy [ ]  Discharge [ ]  Frequency [ ]   Breast:  Breast lumps [ ]   nipple discharge [ ]    Neuro: Headaches[ ]  Vertigo[ ]  Paresthesias[ ]  Spasm [ ]  Speech changes [ ]  Incoordination [ ]   Ortho: Arthritis [ ]  Joint pain [ ]  Muscle pain [ ]  Joint swelling [ ]  Back Pain [ ]  Skin:  Rash [ ]   Pruritis [ ]  Change in skin lesion [ ]   Psych: Depression[x ] Anxiety[ ]  Confusion [ ]  Memory loss [ ]   Heme/Lypmh: Bleeding [ ]  Bruising [ ]  Enlarged lymph nodes [ ]   Endocrine: Visual blurring [ ]  Paresthesia [ ]  Polyuria [ ]  Polydypsea [ ]    Heat/cold intolerance [ ]  Hypoglycemia [ ]   Physical Exam: Estimated body mass index is 25.9 kg/(m^2) as calculated from the following:   Height as of this encounter: 5' 2.5" (1.588 m).   Weight as of this encounter: 144 lb (65.318 kg). BP 120/72  Pulse 72  Temp(Src) 97.7 F (36.5 C)  Resp 16  Ht 5' 2.5" (1.588 m)  Wt 144 lb (65.318 kg)  BMI 25.90 kg/m2 General Appearance: Well nourished, in no apparent distress. Eyes: PERRLA, EOMs, conjunctiva no swelling or erythema, normal  fundi and vessels. Sinuses: No Frontal/maxillary tenderness ENT/Mouth: Ext aud canals clear, normal light reflex with TMs without erythema, bulging.  Good dentition. No erythema, swelling, or exudate on post pharynx. Tonsils not swollen or erythematous. Hearing normal.  Neck: Supple, thyroid normal. No bruits Respiratory: Respiratory effort normal, BS equal bilaterally without rales, rhonchi, wheezing or stridor. Cardio: RRR without murmurs, rubs or gallops. Brisk peripheral pulses without edema.  Chest: symmetric, with normal excursions and percussion. Breasts: declines Abdomen: Soft, +BS. Non tender, no guarding, rebound, hernias, masses, or organomegaly. .  Lymphatics: Non tender without lymphadenopathy.  Genitourinary: defer Musculoskeletal: Full ROM all peripheral extremities,5/5 strength, and normal gait. Skin: Warm, dry without rashes, lesions, ecchymosis.  Neuro: Cranial nerves intact, reflexes equal bilaterally. Normal muscle tone, no cerebellar symptoms. Sensation intact.  Psych: Awake and oriented X 3, normal affect, Insight and Judgment appropriate.   EKG: defer    Vicie Mutters 9:13 AM

## 2014-05-30 LAB — URINALYSIS, ROUTINE W REFLEX MICROSCOPIC
BILIRUBIN URINE: NEGATIVE
Glucose, UA: NEGATIVE mg/dL
Hgb urine dipstick: NEGATIVE
Ketones, ur: NEGATIVE mg/dL
Leukocytes, UA: NEGATIVE
NITRITE: NEGATIVE
Protein, ur: NEGATIVE mg/dL
SPECIFIC GRAVITY, URINE: 1.02 (ref 1.005–1.030)
UROBILINOGEN UA: 0.2 mg/dL (ref 0.0–1.0)
pH: 5 (ref 5.0–8.0)

## 2014-05-30 LAB — MICROALBUMIN / CREATININE URINE RATIO
CREATININE, URINE: 172.1 mg/dL
Microalb Creat Ratio: 6.4 mg/g (ref 0.0–30.0)
Microalb, Ur: 1.1 mg/dL (ref <2.0)

## 2014-06-02 DIAGNOSIS — Z01419 Encounter for gynecological examination (general) (routine) without abnormal findings: Secondary | ICD-10-CM | POA: Diagnosis not present

## 2014-06-02 DIAGNOSIS — Z124 Encounter for screening for malignant neoplasm of cervix: Secondary | ICD-10-CM | POA: Diagnosis not present

## 2014-07-07 ENCOUNTER — Other Ambulatory Visit: Payer: Self-pay | Admitting: Orthopaedic Surgery

## 2014-07-07 DIAGNOSIS — M545 Low back pain, unspecified: Secondary | ICD-10-CM

## 2014-07-07 DIAGNOSIS — M7918 Myalgia, other site: Secondary | ICD-10-CM

## 2014-07-07 DIAGNOSIS — G8929 Other chronic pain: Secondary | ICD-10-CM

## 2014-07-18 ENCOUNTER — Ambulatory Visit
Admission: RE | Admit: 2014-07-18 | Discharge: 2014-07-18 | Disposition: A | Discharge status: Home / Self Care | Payer: Medicare Other | Source: Ambulatory Visit | Attending: Orthopaedic Surgery | Admitting: Orthopaedic Surgery

## 2014-07-18 DIAGNOSIS — G8929 Other chronic pain: Secondary | ICD-10-CM

## 2014-07-18 DIAGNOSIS — M545 Low back pain, unspecified: Secondary | ICD-10-CM

## 2014-07-18 DIAGNOSIS — M7918 Myalgia, other site: Secondary | ICD-10-CM

## 2014-07-18 DIAGNOSIS — M48061 Spinal stenosis, lumbar region without neurogenic claudication: Secondary | ICD-10-CM

## 2014-07-18 MED ORDER — IOHEXOL 180 MG/ML  SOLN
1.0000 mL | Freq: Once | INTRAMUSCULAR | Status: AC | PRN
  Administered 2014-07-18: 1 mL via EPIDURAL

## 2014-07-18 MED ORDER — METHYLPREDNISOLONE ACETATE 40 MG/ML INJ SUSP (RADIOLOG
120.0000 mg | Freq: Once | INTRAMUSCULAR | Status: AC
  Administered 2014-07-18: 120 mg via EPIDURAL

## 2014-07-18 NOTE — Discharge Instructions (Signed)

## 2014-08-10 ENCOUNTER — Other Ambulatory Visit: Payer: Self-pay | Admitting: Internal Medicine

## 2014-08-10 ENCOUNTER — Other Ambulatory Visit: Payer: Self-pay | Admitting: Physician Assistant

## 2014-08-10 DIAGNOSIS — G43009 Migraine without aura, not intractable, without status migrainosus: Secondary | ICD-10-CM

## 2014-08-10 DIAGNOSIS — R002 Palpitations: Secondary | ICD-10-CM

## 2014-08-10 MED ORDER — VERAPAMIL HCL 80 MG PO TABS: ORAL_TABLET | ORAL | Status: DC

## 2014-08-20 ENCOUNTER — Encounter: Payer: Self-pay | Admitting: *Deleted

## 2014-10-21 ENCOUNTER — Encounter: Payer: Self-pay | Admitting: Internal Medicine

## 2014-10-21 ENCOUNTER — Ambulatory Visit (INDEPENDENT_AMBULATORY_CARE_PROVIDER_SITE_OTHER): Payer: Medicare Other | Admitting: Internal Medicine

## 2014-10-21 VITALS — BP 136/88 | HR 78 | Temp 98.0°F | Resp 16 | Ht 62.5 in | Wt 148.0 lb

## 2014-10-21 DIAGNOSIS — E559 Vitamin D deficiency, unspecified: Secondary | ICD-10-CM | POA: Insufficient documentation

## 2014-10-21 DIAGNOSIS — E785 Hyperlipidemia, unspecified: Secondary | ICD-10-CM | POA: Diagnosis not present

## 2014-10-21 DIAGNOSIS — G8929 Other chronic pain: Secondary | ICD-10-CM

## 2014-10-21 DIAGNOSIS — M545 Low back pain, unspecified: Secondary | ICD-10-CM

## 2014-10-21 DIAGNOSIS — R7309 Other abnormal glucose: Secondary | ICD-10-CM | POA: Diagnosis not present

## 2014-10-21 DIAGNOSIS — I10 Essential (primary) hypertension: Secondary | ICD-10-CM

## 2014-10-21 DIAGNOSIS — Z79899 Other long term (current) drug therapy: Secondary | ICD-10-CM

## 2014-10-21 DIAGNOSIS — R7303 Prediabetes: Secondary | ICD-10-CM

## 2014-10-21 DIAGNOSIS — G43109 Migraine with aura, not intractable, without status migrainosus: Secondary | ICD-10-CM

## 2014-10-21 LAB — CBC WITH DIFFERENTIAL/PLATELET
BASOS ABS: 0 10*3/uL (ref 0.0–0.1)
BASOS PCT: 0 % (ref 0–1)
Eosinophils Absolute: 0.3 10*3/uL (ref 0.0–0.7)
Eosinophils Relative: 3 % (ref 0–5)
HCT: 44.2 % (ref 36.0–46.0)
HEMOGLOBIN: 14.8 g/dL (ref 12.0–15.0)
LYMPHS ABS: 2.6 10*3/uL (ref 0.7–4.0)
Lymphocytes Relative: 31 % (ref 12–46)
MCH: 30.1 pg (ref 26.0–34.0)
MCHC: 33.5 g/dL (ref 30.0–36.0)
MCV: 90 fL (ref 78.0–100.0)
MPV: 9.9 fL (ref 8.6–12.4)
Monocytes Absolute: 0.6 10*3/uL (ref 0.1–1.0)
Monocytes Relative: 7 % (ref 3–12)
Neutro Abs: 5 10*3/uL (ref 1.7–7.7)
Neutrophils Relative %: 59 % (ref 43–77)
Platelets: 351 10*3/uL (ref 150–400)
RBC: 4.91 MIL/uL (ref 3.87–5.11)
RDW: 13 % (ref 11.5–15.5)
WBC: 8.5 10*3/uL (ref 4.0–10.5)

## 2014-10-21 MED ORDER — RIZATRIPTAN BENZOATE 10 MG PO TABS: 10.0000 mg | ORAL_TABLET | ORAL | Status: DC | PRN

## 2014-10-21 NOTE — Patient Instructions (Signed)

## 2014-10-21 NOTE — Progress Notes (Signed)
Patient ID: Rebekah Hamilton, female   DOB: 1945/09/20, 69 y.o.   MRN: [id number]   Assessment and Plan:  Hypertension:  -Continue medication -monitor blood pressure at home. -Continue DASH diet -Reminder to go to the ER if any CP, SOB, nausea, dizziness, severe HA, changes vision/speech, left arm numbness and tingling and jaw pain.  Cholesterol - Continue diet and exercise -Check cholesterol.   Prediabetes -Continue diet and exercise.  -Check A1C  Vitamin D Def -check level -continue medications.  Migraines  -refilled maxalt - avoid stressors -start exercise as stress relief  Depression and anxiety - patient not taking zoloft daily, and is taking sporadically.  Have recommended after tax season taking 50 mg daily for 6 week trial.  D/c until taking consistently -cont xanax as rescue medication.  Chronic back pain - cont meds as needed for pain - F/u with Dr. Elias Else for possible repeat epidural injection  Continue diet and meds as discussed. Further disposition pending results of labs. Discussed med's effects and SE's.    HPI 69 y.o. female  presents for 3 month follow up with hypertension, hyperlipidemia, diabetes and vitamin D deficiency.   Her blood pressure has been controlled at home, today their BP is BP: 136/88 mmHg.She does not workout. She denies chest pain, shortness of breath, dizziness.   She is not on cholesterol medication and denies myalgias. Her cholesterol is at goal. The cholesterol was:  05/29/2014: Cholesterol, Total 143; HDL-C 55; LDL (calc) 73; Triglycerides 77   She has not been working on diet and exercise for prediabetes, she is on bASA, she is on ACE/ARB, and denies  foot ulcerations, hyperglycemia, hypoglycemia , increased appetite, nausea, paresthesia of the feet, polydipsia, polyuria, visual disturbances, vomiting and weight loss. Last A1C was: 05/29/2014: Hemoglobin-A1c 5.8*   Patient is on Vitamin D supplement. No results found for  requested labs within last 365 days.  Patient also complaining of continued migraines.  Patient states that she has been under a lot of stress lately and that her migraines are increasing significantly.  She is out of her maxalt.  She is not taking the verapamil for the migraines.  She only takes that when she is running a fast heart beat at night.    Patient also complains that she is continuing to have back pain.  She did have an epidural injection but only got two weeks of relief from it.  She has not been back.  No saddle anesthesias no loss of bowel or bladder.    Patient also reports that she is currently in therapy for her depression.    Current Medications:  Current Outpatient Prescriptions on File Prior to Visit  Medication Sig Dispense Refill  . ALPRAZolam (XANAX) 0.25 MG tablet Take 1 tablet (0.25 mg total) by mouth 3 (three) times daily as needed. 90 tablet 1  . Ascorbic Acid (VITAMIN C PO) 500 mg 2 (two) times daily.    Marland Kitchen aspirin 325 MG tablet Take 325 mg by mouth daily.    . Cholecalciferol (VITAMIN D PO) Take 4,000 Int'l Units by mouth.    . estradiol (ESTRACE) 0.5 MG tablet TAKE 1 TABLET BY MOUTH EVERY DAY (Patient taking differently: TAKE 1/2 TABLET BY MOUTH EVERY DAY) 90 tablet 3  . fexofenadine (ALLEGRA) 180 MG tablet Take 180 mg by mouth daily.    . pravastatin (PRAVACHOL) 40 MG tablet Take 1 tablet (40 mg total) by mouth daily. 90 tablet 1  . progesterone (PROMETRIUM) 100 MG capsule TAKE  ONE CAPSULE BY MOUTH EVERY DAY 90 capsule 3  . rizatriptan (MAXALT) 10 MG tablet Take 1 tablet (10 mg total) by mouth as needed for migraine. May repeat in 2 hours if needed 10 tablet 1  . sertraline (ZOLOFT) 100 MG tablet Take 1 tablet (100 mg total) by mouth daily. (Patient taking differently: Take 50 mg by mouth daily. ) 90 tablet 0  . traZODone (DESYREL) 150 MG tablet TAKE 1/3-1/2-1 TABLET BY MOUTH 1 HOUR PRIOR TO BEDTIME AS NEEDED FOR SLEEP 30 tablet 1  . verapamil (CALAN) 80 MG tablet  TAKE 1 TABLET BY MOUTH THREE TIMES DAILY 270 tablet 0   No current facility-administered medications on file prior to visit.   Medical History:  Past Medical History  Diagnosis Date  . Hyperlipidemia   . Hypertension   . Prediabetes   . Migraines   . Allergy   . Anemia   . Anxiety   . Depression   . Lumbar stenosis    Allergies:  Allergies  Allergen Reactions  . Augmentin [Amoxicillin-Pot Clavulanate] Diarrhea  . Prozac [Fluoxetine Hcl] Other (See Comments)    Disoriented  . Wellbutrin [Bupropion] Other (See Comments)    Insomnia/Nervousness     Review of Systems:  Review of Systems  Constitutional: Negative.   HENT: Negative for congestion, hearing loss, sore throat and tinnitus.   Eyes: Negative for blurred vision, double vision, photophobia, pain and redness.  Respiratory: Negative.   Cardiovascular: Negative.   Gastrointestinal: Negative.   Genitourinary: Negative.   Musculoskeletal: Negative.   Skin: Negative.   Neurological: Positive for headaches. Negative for dizziness, tingling, sensory change and focal weakness.  Psychiatric/Behavioral: Negative for depression and suicidal ideas. The patient is nervous/anxious.     Family history- Review and unchanged  Social history- Review and unchanged  Physical Exam: BP 136/88 mmHg  Pulse 78  Temp(Src) 98 F (36.7 C) (Temporal)  Resp 16  Ht 5' 2.5" (1.588 m)  Wt 148 lb (67.132 kg)  BMI 26.62 kg/m2 Wt Readings from Last 3 Encounters:  10/21/14 148 lb (67.132 kg)  05/29/14 144 lb (65.318 kg)  03/20/14 143 lb (64.864 kg)   General Appearance: Well nourished well developed, non-toxic appearing, in no apparent distress. Eyes: PERRLA, EOMs, conjunctiva no swelling or erythema ENT/Mouth: Ear canals clear with no erythema, swelling, or discharge.  TMs normal bilaterally, oropharynx clear, moist, with no exudate.   Neck: Supple, thyroid normal, no JVD, no cervical adenopathy.  Respiratory: Respiratory effort  normal, breath sounds clear A&P, no wheeze, rhonchi or rales noted.  No retractions, no accessory muscle usage Cardio: RRR with no MRGs. No noted edema.  Abdomen: Soft, + BS.  Non tender, no guarding, rebound, hernias, masses. Musculoskeletal: Full ROM, 5/5 strength, Normal gait Skin: Warm, dry without rashes, lesions, ecchymosis.  Neuro: Awake and oriented X 3, Cranial nerves intact. No cerebellar symptoms.  Psych: normal affect, Insight and Judgment appropriate.    FORCUCCI, Karyssa Amaral, PA-C 2:10 PM [facility]

## 2014-10-22 LAB — BASIC METABOLIC PANEL WITH GFR
BUN: 14 mg/dL (ref 6–23)
CALCIUM: 9.7 mg/dL (ref 8.4–10.5)
CHLORIDE: 105 meq/L (ref 96–112)
CO2: 28 mEq/L (ref 19–32)
CREATININE: 0.78 mg/dL (ref 0.50–1.10)
GFR, Est Non African American: 79 mL/min
Glucose, Bld: 85 mg/dL (ref 70–99)
Potassium: 4 mEq/L (ref 3.5–5.3)
Sodium: 143 mEq/L (ref 135–145)

## 2014-10-22 LAB — HEPATIC FUNCTION PANEL
ALBUMIN: 4.2 g/dL (ref 3.5–5.2)
ALT: 19 U/L (ref 0–35)
AST: 22 U/L (ref 0–37)
Alkaline Phosphatase: 50 U/L (ref 39–117)
Bilirubin, Direct: 0.1 mg/dL (ref 0.0–0.3)
Indirect Bilirubin: 0.2 mg/dL (ref 0.2–1.2)
TOTAL PROTEIN: 6.6 g/dL (ref 6.0–8.3)
Total Bilirubin: 0.3 mg/dL (ref 0.2–1.2)

## 2014-10-22 LAB — HEMOGLOBIN A1C
HEMOGLOBIN A1C: 5.5 % (ref <5.7)
MEAN PLASMA GLUCOSE: 111 mg/dL (ref <117)

## 2014-10-22 LAB — MAGNESIUM: Magnesium: 2.1 mg/dL (ref 1.5–2.5)

## 2014-10-22 LAB — TSH: TSH: 2.107 u[IU]/mL (ref 0.350–4.500)

## 2014-10-22 LAB — VITAMIN D 25 HYDROXY (VIT D DEFICIENCY, FRACTURES): Vit D, 25-Hydroxy: 36 ng/mL (ref 30–100)

## 2014-10-22 LAB — LIPID PANEL
Cholesterol: 167 mg/dL (ref 0–200)
HDL: 52 mg/dL (ref >=46)
LDL Cholesterol: 98 mg/dL (ref 0–99)
Total CHOL/HDL Ratio: 3.2 Ratio
Triglycerides: 85 mg/dL (ref <150)
VLDL: 17 mg/dL (ref 0–40)

## 2014-10-22 LAB — INSULIN, FASTING: INSULIN FASTING, SERUM: 11 u[IU]/mL (ref 2.0–19.6)

## 2014-11-29 ENCOUNTER — Encounter: Payer: Self-pay | Admitting: *Deleted

## 2015-02-03 ENCOUNTER — Encounter: Payer: Self-pay | Admitting: Internal Medicine

## 2015-02-03 ENCOUNTER — Ambulatory Visit (INDEPENDENT_AMBULATORY_CARE_PROVIDER_SITE_OTHER): Payer: Medicare Other | Admitting: Internal Medicine

## 2015-02-03 VITALS — BP 124/72 | HR 72 | Temp 97.3°F | Resp 16 | Ht 62.5 in | Wt 152.4 lb

## 2015-02-03 DIAGNOSIS — E559 Vitamin D deficiency, unspecified: Secondary | ICD-10-CM | POA: Diagnosis not present

## 2015-02-03 DIAGNOSIS — F411 Generalized anxiety disorder: Secondary | ICD-10-CM

## 2015-02-03 DIAGNOSIS — G43909 Migraine, unspecified, not intractable, without status migrainosus: Secondary | ICD-10-CM

## 2015-02-03 DIAGNOSIS — Z6827 Body mass index (BMI) 27.0-27.9, adult: Secondary | ICD-10-CM

## 2015-02-03 DIAGNOSIS — R7309 Other abnormal glucose: Secondary | ICD-10-CM | POA: Diagnosis not present

## 2015-02-03 DIAGNOSIS — I10 Essential (primary) hypertension: Secondary | ICD-10-CM | POA: Diagnosis not present

## 2015-02-03 DIAGNOSIS — Z79899 Other long term (current) drug therapy: Secondary | ICD-10-CM

## 2015-02-03 DIAGNOSIS — R7303 Prediabetes: Secondary | ICD-10-CM

## 2015-02-03 DIAGNOSIS — E785 Hyperlipidemia, unspecified: Secondary | ICD-10-CM

## 2015-02-03 DIAGNOSIS — N951 Menopausal and female climacteric states: Secondary | ICD-10-CM

## 2015-02-03 LAB — HEPATIC FUNCTION PANEL
ALBUMIN: 4.1 g/dL (ref 3.5–5.2)
ALT: 17 U/L (ref 0–35)
AST: 21 U/L (ref 0–37)
Alkaline Phosphatase: 47 U/L (ref 39–117)
Bilirubin, Direct: 0.1 mg/dL (ref 0.0–0.3)
Indirect Bilirubin: 0.4 mg/dL (ref 0.2–1.2)
Total Bilirubin: 0.5 mg/dL (ref 0.2–1.2)
Total Protein: 6.3 g/dL (ref 6.0–8.3)

## 2015-02-03 LAB — LIPID PANEL
CHOLESTEROL: 145 mg/dL (ref 0–200)
HDL: 52 mg/dL (ref >=46)
LDL CALC: 69 mg/dL (ref 0–99)
Total CHOL/HDL Ratio: 2.8 Ratio
Triglycerides: 121 mg/dL (ref <150)
VLDL: 24 mg/dL (ref 0–40)

## 2015-02-03 LAB — MAGNESIUM: Magnesium: 1.9 mg/dL (ref 1.5–2.5)

## 2015-02-03 LAB — BASIC METABOLIC PANEL WITH GFR
BUN: 11 mg/dL (ref 6–23)
CO2: 25 mEq/L (ref 19–32)
Calcium: 8.9 mg/dL (ref 8.4–10.5)
Chloride: 105 mEq/L (ref 96–112)
Creat: 0.68 mg/dL (ref 0.50–1.10)
GFR, Est Non African American: >89 mL/min
GLUCOSE: 81 mg/dL (ref 70–99)
POTASSIUM: 4.4 meq/L (ref 3.5–5.3)
Sodium: 142 mEq/L (ref 135–145)

## 2015-02-03 MED ORDER — PROGESTERONE MICRONIZED 100 MG PO CAPS: 100.0000 mg | ORAL_CAPSULE | Freq: Every day | ORAL | Status: DC

## 2015-02-03 MED ORDER — SERTRALINE HCL 100 MG PO TABS: ORAL_TABLET | ORAL | Status: DC

## 2015-02-03 MED ORDER — BUPROPION HCL ER (XL) 150 MG PO TB24: ORAL_TABLET | ORAL | Status: DC

## 2015-02-03 MED ORDER — ALPRAZOLAM 0.25 MG PO TABS: 0.2500 mg | ORAL_TABLET | Freq: Three times a day (TID) | ORAL | Status: DC | PRN

## 2015-02-03 MED ORDER — RIZATRIPTAN BENZOATE 10 MG PO TABS: 10.0000 mg | ORAL_TABLET | ORAL | Status: DC | PRN

## 2015-02-03 NOTE — Progress Notes (Signed)
Patient ID: Rebekah Hamilton, female   DOB: [birth year], 69 y.o.   MRN: [id number]   This very nice 69 y.o. MWF presents for 3 month follow up with Hypertension, Hyperlipidemia, Pre-Diabetes and Vitamin D Deficiency.    Patient is treated for HTN & BP has been controlled at home. Today's BP: 124/72 mmHg. Patient has had no complaints of any cardiac type chest pain, palpitations, dyspnea/orthopnea/PND, dizziness, claudication, or dependent edema.   Hyperlipidemia is controlled with diet & meds. Patient denies myalgias or other med SE's. Last Lipids were at goal - Chol 167; HDL 52; LDL  98; Trig 85 on 10/21/2014.  Also, the patient has history of PreDiabetes and has had no symptoms of reactive hypoglycemia, diabetic polys, paresthesias or visual blurring.  Last A1c was  5.5% on 10/21/2014.    Further, the patient also has history of Vitamin D Deficiency and supplements vitamin D without any suspected side-effects. Last vitamin D was 36 on 10/21/2014.  Medication Sig  . ALPRAZolam (XANAX) 0.25 MG tablet Take 1 tablet (0.25 mg total) by mouth 3 (three) times daily as needed.  . Ascorbic Acid (VITAMIN C PO) 500 mg 2 (two) times daily.  Marland Kitchen aspirin 325 MG tablet Take 325 mg by mouth daily.  . Cholecalciferol (VITAMIN D PO) Take 4,000 Int'l Units by mouth.  . estradiol (ESTRACE) 0.5 MG tablet TAKE 1 TABLET BY MOUTH EVERY DAY (Patient taking differently: TAKE 1/2 TABLET BY MOUTH EVERY DAY)  . fexofenadine (ALLEGRA) 180 MG tablet Take 180 mg by mouth daily.  . pravastatin (PRAVACHOL) 40 MG tablet Take 1 tablet (40 mg total) by mouth daily.  . progesterone (PROMETRIUM) 100 MG capsule TAKE ONE CAPSULE BY MOUTH EVERY DAY  . rizatriptan (MAXALT) 10 MG tablet Take 1 tablet (10 mg total) by mouth as needed for migraine. May repeat in 2 hours if needed  . sertraline (ZOLOFT) 100 MG tablet Take 1 tablet (100 mg total) by mouth daily. (Patient taking differently: Take 50 mg by mouth daily. )  . traZODone (DESYREL) 150 MG  tablet TAKE 1/3-1/2-1 TABLET BY MOUTH 1 HOUR PRIOR TO BEDTIME AS NEEDED FOR SLEEP  . verapamil (CALAN) 80 MG tablet TAKE 1 TABLET BY MOUTH THREE TIMES DAILY   Allergies  Allergen Reactions  . Augmentin [Amoxicillin-Pot Clavulanate] Diarrhea  . Prozac [Fluoxetine Hcl] Other (See Comments)    Disoriented  . Wellbutrin [Bupropion] Other (See Comments)    Insomnia/Nervousness   PMHx:   Past Medical History  Diagnosis Date  . Hyperlipidemia   . Hypertension   . Prediabetes   . Migraines   . Allergy   . Anemia   . Anxiety   . Depression   . Lumbar stenosis    Immunization History  Administered Date(s) Administered  . Influenza, High Dose Seasonal PF 05/29/2014  . Influenza,inj,quad, With Preservative 08/12/2013  . Pneumococcal Polysaccharide-23 05/29/2013  . Td 08/15/2005   Past Surgical History  Procedure Laterality Date  . Cholecystectomy  1999   FHx:    Reviewed / unchanged  SHx:    Reviewed / unchanged  Systems Review:  Constitutional: Denies fever, chills, wt changes, headaches, insomnia, fatigue, night sweats, change in appetite. Eyes: Denies redness, blurred vision, diplopia, discharge, itchy, watery eyes.  ENT: Denies discharge, congestion, post nasal drip, epistaxis, sore throat, earache, hearing loss, dental pain, tinnitus, vertigo, sinus pain, snoring.  CV: Denies chest pain, palpitations, irregular heartbeat, syncope, dyspnea, diaphoresis, orthopnea, PND, claudication or edema. Respiratory: denies cough, dyspnea, DOE, pleurisy, hoarseness,  laryngitis, wheezing.  Gastrointestinal: Denies dysphagia, odynophagia, heartburn, reflux, water brash, abdominal pain or cramps, nausea, vomiting, bloating, diarrhea, constipation, hematemesis, melena, hematochezia  or hemorrhoids. Genitourinary: Denies dysuria, frequency, urgency, nocturia, hesitancy, discharge, hematuria or flank pain. Musculoskeletal: Denies arthralgias, myalgias, stiffness, jt. swelling, pain, limping or  strain/sprain.  Skin: Denies pruritus, rash, hives, warts, acne, eczema or change in skin lesion(s). Neuro: No weakness, tremor, incoordination, spasms, paresthesia or pain. Psychiatric: Denies confusion, memory loss or sensory loss. Endo: Denies change in weight, skin or hair change.  Heme/Lymph: No excessive bleeding, bruising or enlarged lymph nodes.  Physical Exam  BP 124/72   Pulse 72  Temp 97.3 F   Resp 16  Ht 5' 2.5"  Wt 152 lb 6.4 oz     BMI 27.41   Appears well nourished and in no distress. Eyes: PERRLA, EOMs, conjunctiva no swelling or erythema. Sinuses: No frontal/maxillary tenderness ENT/Mouth: EAC's clear, TM's nl w/o erythema, bulging. Nares clear w/o erythema, swelling, exudates. Oropharynx clear without erythema or exudates. Oral hygiene is good. Tongue normal, non obstructing. Hearing intact.  Neck: Supple. Thyroid nl. Car 2+/2+ without bruits, nodes or JVD. Chest: Respirations nl with BS clear & equal w/o rales, rhonchi, wheezing or stridor.  Cor: Heart sounds normal w/ regular rate and rhythm without sig. murmurs, gallops, clicks, or rubs. Peripheral pulses normal and equal  without edema.  Abdomen: Soft & bowel sounds normal. Non-tender w/o guarding, rebound, hernias, masses, or organomegaly.  Lymphatics: Unremarkable.  Musculoskeletal: Full ROM all peripheral extremities, joint stability, 5/5 strength, and normal gait.  Skin: Warm, dry without exposed rashes, lesions or ecchymosis apparent.  Neuro: Cranial nerves intact, reflexes equal bilaterally. Sensory-motor testing grossly intact. Tendon reflexes grossly intact.  Pysch: Alert & oriented x 3.  Insight and judgement nl & appropriate. No ideations.  Assessment and Plan:  1. Essential hypertension  - TSH  2. Hyperlipidemia  - Lipid panel  3. Prediabetes  - Hemoglobin A1c - Insulin, random  4. Vitamin D deficiency  - Vit D  25 hydroxy   5. Encounter for long-term (current) use of  medications  - CBC with Differential/Platelet - BASIC METABOLIC PANEL WITH GFR - Hepatic function panel - Magnesium  6. Depression   - Recc decrease Sertraline 100 mg to 1/4 tab = 25 mg qhs  - New Rx Ibuprofen 150 mg XL #90  X 1 rf - take 1 tab qam A use alprazolam 0.25 mg #90 x 1 rf - take 1/2 to 1 tab tid prn.   - ROV 3 weeks to reassess.   Recommended regular exercise, BP monitoring, weight control, and discussed med and SE's. Recommended labs to assess and monitor clinical status. Further disposition pending results of labs. Over 30 minutes of exam, counseling, chart review was performed

## 2015-02-03 NOTE — Patient Instructions (Addendum)
Start Bupropion 150 mg XL every morning  Change Sertraline 100 mg to 1/4 tablet = 25 mg at bedtime  Use Alprazolam 0.25 mg at 1/2 to 1 tablet 3 x day if needed for anxiety  +++++++++++++++++++++++++++++++++++++++++++++++++++ +++++++++++++++++++++++++++++++++++++++++++++++++++  Recommend Adult Low dose Aspirin or baby Aspirin 81 mg daily   To reduce risk of Colon Cancer 20 %,   Skin Cancer 26 % ,   Melanoma 46%   and   Pancreatic cancer 60%  ++++++++++++++++++  Vitamin D goal is between 70-100.   Please make sure that you are taking your Vitamin D as directed.   It is very important as a natural anti-inflammatory   helping hair, skin, and nails, as well as reducing stroke and heart attack risk.   It helps your bones and helps with mood.  It also decreases numerous cancer risks so please take it as directed.   Low Vit D is associated with a 200-300% higher risk for CANCER   and 200-300% higher risk for HEART   ATTACK  &  STROKE.    .....................................Marland Kitchen  It is also associated with higher death rate at younger ages,   autoimmune diseases like Rheumatoid arthritis, Lupus, Multiple Sclerosis.     Also many other serious conditions, like depression, Alzheimer's  Dementia, infertility, muscle aches, fatigue, fibromyalgia - just to name a few.  +++++++++++++++++++    Recommend the book "The END of DIETING" by Dr Excell Seltzer   & the book "The END of DIABETES " by Dr Excell Seltzer  At [email or web address] - get book & Audio CD's     Being diabetic has a  300% increased risk for heart attack, stroke, cancer, and alzheimer- type vascular dementia. It is very important that you work harder with diet by avoiding all foods that are white. Avoid white rice (brown & wild rice is OK), white potatoes (sweetpotatoes in moderation is OK), White bread or wheat bread or anything made out of white flour like bagels, donuts, rolls, buns, biscuits, cakes, pastries, cookies,  pizza crust, and pasta (made from white flour & egg whites) - vegetarian pasta or spinach or wheat pasta is OK. Multigrain breads like Arnold's or [employer], or multigrain sandwich thins or flatbreads.  Diet, exercise and weight loss can reverse and cure diabetes in the early stages.  Diet, exercise and weight loss is very important in the control and prevention of complications of diabetes which affects every system in your body, ie. Brain - dementia/stroke, eyes - glaucoma/blindness, heart - heart attack/heart failure, kidneys - dialysis, stomach - gastric paralysis, intestines - malabsorption, nerves - severe painful neuritis, circulation - gangrene & loss of a leg(s), and finally cancer and Alzheimers.    I recommend avoid fried & greasy foods,  sweets/candy, white rice (brown or wild rice or Quinoa is OK), white potatoes (sweet potatoes are OK) - anything made from white flour - bagels, doughnuts, rolls, buns, biscuits,white and wheat breads, pizza crust and traditional pasta made of white flour & egg white(vegetarian pasta or spinach or wheat pasta is OK).  Multi-grain bread is OK - like multi-grain flat bread or sandwich thins. Avoid alcohol in excess. Exercise is also important.    Eat all the vegetables you want - avoid meat, especially red meat and dairy - especially cheese.  Cheese is the most concentrated form of trans-fats which is the worst thing to clog up our arteries. Veggie cheese is OK which can be found in the fresh produce section at  [employer] or [employer] or [employer]  ++++++++++++++++++++++++++

## 2015-02-04 LAB — CBC WITH DIFFERENTIAL/PLATELET
BASOS PCT: 0 % (ref 0–1)
Basophils Absolute: 0 10*3/uL (ref 0.0–0.1)
EOS ABS: 0.3 10*3/uL (ref 0.0–0.7)
Eosinophils Relative: 3 % (ref 0–5)
HEMATOCRIT: 43.5 % (ref 36.0–46.0)
Hemoglobin: 14.5 g/dL (ref 12.0–15.0)
LYMPHS PCT: 35 % (ref 12–46)
Lymphs Abs: 3.4 10*3/uL (ref 0.7–4.0)
MCH: 29.7 pg (ref 26.0–34.0)
MCHC: 33.3 g/dL (ref 30.0–36.0)
MCV: 89.1 fL (ref 78.0–100.0)
MONO ABS: 0.9 10*3/uL (ref 0.1–1.0)
MONOS PCT: 9 % (ref 3–12)
MPV: 9.8 fL (ref 8.6–12.4)
NEUTROS PCT: 53 % (ref 43–77)
Neutro Abs: 5.1 10*3/uL (ref 1.7–7.7)
Platelets: 323 10*3/uL (ref 150–400)
RBC: 4.88 MIL/uL (ref 3.87–5.11)
RDW: 13.8 % (ref 11.5–15.5)
WBC: 9.7 10*3/uL (ref 4.0–10.5)

## 2015-02-04 LAB — VITAMIN D 25 HYDROXY (VIT D DEFICIENCY, FRACTURES): Vit D, 25-Hydroxy: 24 ng/mL — ABNORMAL LOW (ref 30–100)

## 2015-02-04 LAB — INSULIN, RANDOM: Insulin: 28.4 u[IU]/mL — ABNORMAL HIGH (ref 2.0–19.6)

## 2015-02-04 LAB — HEMOGLOBIN A1C
Hgb A1c MFr Bld: 5.8 % — ABNORMAL HIGH (ref <5.7)
Mean Plasma Glucose: 120 mg/dL — ABNORMAL HIGH (ref <117)

## 2015-02-04 LAB — TSH: TSH: 1.988 u[IU]/mL (ref 0.350–4.500)

## 2015-02-05 ENCOUNTER — Other Ambulatory Visit: Payer: Self-pay | Admitting: Physician Assistant

## 2015-02-22 NOTE — Patient Instructions (Signed)
Recommend Adult Low dose Aspirin or coated  Aspirin 81 mg daily   To reduce risk of Colon Cancer 20 %,   Skin Cancer 26 % ,   Melanoma 46%   and   Pancreatic cancer 60% ++++++++++++++++++ Vitamin D goal is between 70-100.   Please make sure that you are taking your Vitamin D as directed.   It is very important as a natural anti-inflammatory   helping hair, skin, and nails, as well as reducing stroke and heart attack risk.   It helps your bones and helps with mood.  It also decreases numerous cancer risks so please take it as directed.   Low Vit D is associated with a 200-300% higher risk for CANCER   and 200-300% higher risk for HEART   ATTACK  &  STROKE.   ......................................  It is also associated with higher death rate at younger ages,   autoimmune diseases like Rheumatoid arthritis, Lupus, Multiple Sclerosis.     Also many other serious conditions, like depression, Alzheimer's  Dementia, infertility, muscle aches, fatigue, fibromyalgia - just to name a few.  +++++++++++++++++++  Recommend the book "The END of DIETING" by Dr Joel Fuhrman   & the book "The END of DIABETES " by Dr Joel Fuhrman  At [employer] - get book & Audio CD's     Being diabetic has a  300% increased risk for heart attack, stroke, cancer, and alzheimer- type vascular dementia. It is very important that you work harder with diet by avoiding all foods that are white. Avoid white rice (brown & wild rice is OK), white potatoes (sweetpotatoes in moderation is OK), White bread or wheat bread or anything made out of white flour like bagels, donuts, rolls, buns, biscuits, cakes, pastries, cookies, pizza crust, and pasta (made from white flour & egg whites) - vegetarian pasta or spinach or wheat pasta is OK. Multigrain breads like Arnold's or [employer], or multigrain sandwich thins or flatbreads.  Diet, exercise and weight loss can reverse and cure diabetes in the early stages.   Diet, exercise and weight loss is very important in the control and prevention of complications of diabetes which affects every system in your body, ie. Brain - dementia/stroke, eyes - glaucoma/blindness, heart - heart attack/heart failure, kidneys - dialysis, stomach - gastric paralysis, intestines - malabsorption, nerves - severe painful neuritis, circulation - gangrene & loss of a leg(s), and finally cancer and Alzheimers.    I recommend avoid fried & greasy foods,  sweets/candy, white rice (brown or wild rice or Quinoa is OK), white potatoes (sweet potatoes are OK) - anything made from white flour - bagels, doughnuts, rolls, buns, biscuits,white and wheat breads, pizza crust and traditional pasta made of white flour & egg white(vegetarian pasta or spinach or wheat pasta is OK).  Multi-grain bread is OK - like multi-grain flat bread or sandwich thins. Avoid alcohol in excess. Exercise is also important.    Eat all the vegetables you want - avoid meat, especially red meat and dairy - especially cheese.  Cheese is the most concentrated form of trans-fats which is the worst thing to clog up our arteries. Veggie cheese is OK which can be found in the fresh produce section at [employer] or [employer] or [employer]  ++++++++++++++++++++++++++   Preventive Care for Adults  A healthy lifestyle and preventive care can promote health and wellness. Preventive health guidelines for women include the following key practices.  A routine yearly physical is a good way   to check with your health care provider about your health and preventive screening. It is a chance to share any concerns and updates on your health and to receive a thorough exam.  Visit your dentist for a routine exam and preventive care every 6 months. Brush your teeth twice a day and floss once a day. Good oral hygiene prevents tooth decay and gum disease.  The frequency of eye exams is based on your age, health, family medical history, use  of contact lenses, and other factors. Follow your health care provider's recommendations for frequency of eye exams.  Eat a healthy diet. Foods like vegetables, fruits, whole grains, low-fat dairy products, and lean protein foods contain the nutrients you need without too many calories. Decrease your intake of foods high in solid fats, added sugars, and salt. Eat the right amount of calories for you.Get information about a proper diet from your health care provider, if necessary.  Regular physical exercise is one of the most important things you can do for your health. Most adults should get at least 150 minutes of moderate-intensity exercise (any activity that increases your heart rate and causes you to sweat) each week. In addition, most adults need muscle-strengthening exercises on 2 or more days a week.  Maintain a healthy weight. The body mass index (BMI) is a screening tool to identify possible weight problems. It provides an estimate of body fat based on height and weight. Your health care provider can find your BMI and can help you achieve or maintain a healthy weight.For adults 20 years and older:  A BMI below 18.5 is considered underweight.  A BMI of 18.5 to 24.9 is normal.  A BMI of 25 to 29.9 is considered overweight.  A BMI of 30 and above is considered obese.  Maintain normal blood lipids and cholesterol levels by exercising and minimizing your intake of saturated fat. Eat a balanced diet with plenty of fruit and vegetables. If your lipid or cholesterol levels are high, you are over 50, or you are at high risk for heart disease, you may need your cholesterol levels checked more frequently.Ongoing high lipid and cholesterol levels should be treated with medicines if diet and exercise are not working.  If you smoke, find out from your health care provider how to quit. If you do not use tobacco, do not start.  Lung cancer screening is recommended for adults aged 17-80 years who are  at high risk for developing lung cancer because of a history of smoking. A yearly low-dose CT scan of the lungs is recommended for people who have at least a 30-pack-year history of smoking and are a current smoker or have quit within the past 15 years. A pack year of smoking is smoking an average of 1 pack of cigarettes a day for 1 year (for example: 1 pack a day for 30 years or 2 packs a day for 15 years). Yearly screening should continue until the smoker has stopped smoking for at least 15 years. Yearly screening should be stopped for people who develop a health problem that would prevent them from having lung cancer treatment.  Avoid use of street drugs. Do not share needles with anyone. Ask for help if you need support or instructions about stopping the use of drugs.  High blood pressure causes heart disease and increases the risk of stroke.  Ongoing high blood pressure should be treated with medicines if weight loss and exercise do not work.  If you are 55-79  years old, ask your health care provider if you should take aspirin to prevent strokes.  Diabetes screening involves taking a blood sample to check your fasting blood sugar level. This should be done once every 3 years, after age 3, if you are within normal weight and without risk factors for diabetes. Testing should be considered at a younger age or be carried out more frequently if you are overweight and have at least 1 risk factor for diabetes.  Breast cancer screening is essential preventive care for women. You should practice "breast self-awareness." This means understanding the normal appearance and feel of your breasts and may include breast self-examination. Any changes detected, no matter how small, should be reported to a health care provider. Women in their 10s and 30s should have a clinical breast exam (CBE) by a health care provider as part of a regular health exam every 1 to 3 years. After age 67, women should have a CBE every  year. Starting at age 66, women should consider having a mammogram (breast X-ray test) every year. Women who have a family history of breast cancer should talk to their health care provider about genetic screening. Women at a high risk of breast cancer should talk to their health care providers about having an MRI and a mammogram every year.  Breast cancer gene (BRCA)-related cancer risk assessment is recommended for women who have family members with BRCA-related cancers. BRCA-related cancers include breast, ovarian, tubal, and peritoneal cancers. Having family members with these cancers may be associated with an increased risk for harmful changes (mutations) in the breast cancer genes BRCA1 and BRCA2. Results of the assessment will determine the need for genetic counseling and BRCA1 and BRCA2 testing.  Routine pelvic exams to screen for cancer are no longer recommended for nonpregnant women who are considered low risk for cancer of the pelvic organs (ovaries, uterus, and vagina) and who do not have symptoms. Ask your health care provider if a screening pelvic exam is right for you.  If you have had past treatment for cervical cancer or a condition that could lead to cancer, you need Pap tests and screening for cancer for at least 20 years after your treatment. If Pap tests have been discontinued, your risk factors (such as having a new sexual partner) need to be reassessed to determine if screening should be resumed. Some women have medical problems that increase the chance of getting cervical cancer. In these cases, your health care provider may recommend more frequent screening and Pap tests.    Colorectal cancer can be detected and often prevented. Most routine colorectal cancer screening begins at the age of 97 years and continues through age 68 years. However, your health care provider may recommend screening at an earlier age if you have risk factors for colon cancer. On a yearly basis, your health  care provider may provide home test kits to check for hidden blood in the stool. Use of a small camera at the end of a tube, to directly examine the colon (sigmoidoscopy or colonoscopy), can detect the earliest forms of colorectal cancer. Talk to your health care provider about this at age 65, when routine screening begins. Direct exam of the colon should be repeated every 5-10 years through age 81 years, unless early forms of pre-cancerous polyps or small growths are found.  Osteoporosis is a disease in which the bones lose minerals and strength with aging. This can result in serious bone fractures or breaks. The risk of osteoporosis  can be identified using a bone density scan. Women ages 13 years and over and women at risk for fractures or osteoporosis should discuss screening with their health care providers. Ask your health care provider whether you should take a calcium supplement or vitamin D to reduce the rate of osteoporosis.  Menopause can be associated with physical symptoms and risks. Hormone replacement therapy is available to decrease symptoms and risks. You should talk to your health care provider about whether hormone replacement therapy is right for you.  Use sunscreen. Apply sunscreen liberally and repeatedly throughout the day. You should seek shade when your shadow is shorter than you. Protect yourself by wearing long sleeves, pants, a wide-brimmed hat, and sunglasses year round, whenever you are outdoors.  Once a month, do a whole body skin exam, using a mirror to look at the skin on your back. Tell your health care provider of new moles, moles that have irregular borders, moles that are larger than a pencil eraser, or moles that have changed in shape or color.  Stay current with required vaccines (immunizations).  Influenza vaccine. All adults should be immunized every year.  Tetanus, diphtheria, and acellular pertussis (Td, Tdap) vaccine. Pregnant women should receive 1 dose of  Tdap vaccine during each pregnancy. The dose should be obtained regardless of the length of time since the last dose. Immunization is preferred during the 27th-36th week of gestation. An adult who has not previously received Tdap or who does not know her vaccine status should receive 1 dose of Tdap. This initial dose should be followed by tetanus and diphtheria toxoids (Td) booster doses every 10 years. Adults with an unknown or incomplete history of completing a 3-dose immunization series with Td-containing vaccines should begin or complete a primary immunization series including a Tdap dose. Adults should receive a Td booster every 10 years.    Zoster vaccine. One dose is recommended for adults aged 35 years or older unless certain conditions are present.    Pneumococcal 13-valent conjugate (PCV13) vaccine. When indicated, a person who is uncertain of her immunization history and has no record of immunization should receive the PCV13 vaccine. An adult aged 71 years or older who has certain medical conditions and has not been previously immunized should receive 1 dose of PCV13 vaccine. This PCV13 should be followed with a dose of pneumococcal polysaccharide (PPSV23) vaccine. The PPSV23 vaccine dose should be obtained at least 8 weeks after the dose of PCV13 vaccine. An adult aged 63 years or older who has certain medical conditions and previously received 1 or more doses of PPSV23 vaccine should receive 1 dose of PCV13. The PCV13 vaccine dose should be obtained 1 or more years after the last PPSV23 vaccine dose.    Pneumococcal polysaccharide (PPSV23) vaccine. When PCV13 is also indicated, PCV13 should be obtained first. All adults aged 40 years and older should be immunized. An adult younger than age 62 years who has certain medical conditions should be immunized. Any person who resides in a nursing home or long-term care facility should be immunized. An adult smoker should be immunized. People with an  immunocompromised condition and certain other conditions should receive both PCV13 and PPSV23 vaccines. People with human immunodeficiency virus (HIV) infection should be immunized as soon as possible after diagnosis. Immunization during chemotherapy or radiation therapy should be avoided. Routine use of PPSV23 vaccine is not recommended for American Indians, [place] Natives, or people younger than 65 years unless there are medical conditions that require  PPSV23 vaccine. When indicated, people who have unknown immunization and have no record of immunization should receive PPSV23 vaccine. One-time revaccination 5 years after the first dose of PPSV23 is recommended for people aged 19-64 years who have chronic kidney failure, nephrotic syndrome, asplenia, or immunocompromised conditions. People who received 1-2 doses of PPSV23 before age 6 years should receive another dose of PPSV23 vaccine at age 81 years or later if at least 5 years have passed since the previous dose. Doses of PPSV23 are not needed for people immunized with PPSV23 at or after age 25 years.   Preventive Services / Frequency  Ages 65 years and over  Blood pressure check.  Lipid and cholesterol check.  Lung cancer screening. / Every year if you are aged 72-80 years and have a 30-pack-year history of smoking and currently smoke or have quit within the past 15 years. Yearly screening is stopped once you have quit smoking for at least 15 years or develop a health problem that would prevent you from having lung cancer treatment.  Clinical breast exam.** / Every year after age 28 years.  BRCA-related cancer risk assessment.** / For women who have family members with a BRCA-related cancer (breast, ovarian, tubal, or peritoneal cancers).  Mammogram.** / Every year beginning at age 67 years and continuing for as long as you are in good health. Consult with your health care provider.  Pap test.** / Every 3 years starting at age 44 years  through age 38 or 31 years with 3 consecutive normal Pap tests. Testing can be stopped between 65 and 70 years with 3 consecutive normal Pap tests and no abnormal Pap or HPV tests in the past 10 years.  Fecal occult blood test (FOBT) of stool. / Every year beginning at age 97 years and continuing until age 31 years. You may not need to do this test if you get a colonoscopy every 10 years.  Flexible sigmoidoscopy or colonoscopy.** / Every 5 years for a flexible sigmoidoscopy or every 10 years for a colonoscopy beginning at age 3 years and continuing until age 60 years.  Hepatitis C blood test.** / For all people born from [birth year] through 1964 and any individual with known risks for hepatitis C.  Osteoporosis screening.** / A one-time screening for women ages 44 years and over and women at risk for fractures or osteoporosis.  Skin self-exam. / Monthly.  Influenza vaccine. / Every year.  Tetanus, diphtheria, and acellular pertussis (Tdap/Td) vaccine.** / 1 dose of Td every 10 years.  Zoster vaccine.** / 1 dose for adults aged 72 years or older.  Pneumococcal 13-valent conjugate (PCV13) vaccine.** / Consult your health care provider.  Pneumococcal polysaccharide (PPSV23) vaccine.** / 1 dose for all adults aged 64 years and older. Screening for abdominal aortic aneurysm (AAA)  by ultrasound is recommended for people who have history of high blood pressure or who are current or former smokers.

## 2015-02-22 NOTE — Progress Notes (Deleted)
Patient ID: Rebekah Hamilton, female   DOB: 10-02-1945, 69 y.o.   MRN: [id number]

## 2015-02-23 ENCOUNTER — Ambulatory Visit (INDEPENDENT_AMBULATORY_CARE_PROVIDER_SITE_OTHER): Payer: Medicare Other | Admitting: Internal Medicine

## 2015-02-23 ENCOUNTER — Encounter: Payer: Self-pay | Admitting: Internal Medicine

## 2015-02-23 VITALS — BP 138/78 | HR 64 | Temp 97.5°F | Resp 16 | Ht 62.5 in | Wt 151.2 lb

## 2015-02-23 DIAGNOSIS — F411 Generalized anxiety disorder: Secondary | ICD-10-CM

## 2015-02-23 DIAGNOSIS — F32A Depression, unspecified: Secondary | ICD-10-CM

## 2015-02-23 DIAGNOSIS — I10 Essential (primary) hypertension: Secondary | ICD-10-CM

## 2015-02-23 DIAGNOSIS — R7309 Other abnormal glucose: Secondary | ICD-10-CM

## 2015-02-23 DIAGNOSIS — F329 Major depressive disorder, single episode, unspecified: Secondary | ICD-10-CM

## 2015-02-23 DIAGNOSIS — Z6827 Body mass index (BMI) 27.0-27.9, adult: Secondary | ICD-10-CM

## 2015-02-23 MED ORDER — BUPROPION HCL ER (XL) 300 MG PO TB24: ORAL_TABLET | ORAL | Status: DC

## 2015-02-23 NOTE — Progress Notes (Addendum)
Subjective:    Patient ID: Rebekah Hamilton, female    DOB: July 14, 1946, 69 y.o.   MRN: [id number]  HPI Patient returns for f/u after adding Wellbutrin 150 mg XL for depressed mood and stills reports feeling "fuzzy" in the head. She apparently misunderstood and did not decrease her sertraline from 50 to 25 mg as recommended. Does occasionally take low dose Alprazolam 0.25 mg tabs x 1/2 during the work day when feels overwhelmed. No Ideations.   Medication Sig  . ALPRAZolam (XANAX) 0.25 MG tablet Take 1 tablet (0.25 mg total) by mouth 3 (three) times daily as needed.  . Ascorbic Acid (VITAMIN C PO) 500 mg 2 (two) times daily.  Marland Kitchen aspirin 325 MG tablet Take 325 mg by mouth daily.  Marland Kitchen buPROPion (WELLBUTRIN XL) 150 MG 24 hr tablet Take 1 tablet every morning  . Cholecalciferol (VITAMIN D PO) Take 4,000 Int'l Units by mouth.  . estradiol (ESTRACE) 0.5 MG tablet TAKE 1 TABLET BY MOUTH EVERY DAY (Patient taking differently: TAKE 1/2 TABLET BY MOUTH EVERY DAY)  . fexofenadine (ALLEGRA) 180 MG tablet Take 180 mg by mouth daily.  . pravastatin (PRAVACHOL) 40 MG tablet Take 1 tablet (40 mg total) by mouth daily.  . progesterone (PROMETRIUM) 100 MG capsule Take 1 capsule (100 mg total) by mouth daily.  . rizatriptan (MAXALT) 10 MG tablet Take 1 tablet (10 mg total) by mouth as needed for migraine. May repeat in 2 hours if needed  . sertraline (ZOLOFT) 100 MG tablet Take 1/4 to 1/2 to 1 tablet at bedtime as directed  . traZODone (DESYREL) 150 MG tablet TAKE 1/3-1/2-1 TABLET BY MOUTH 1 HOUR PRIOR TO BEDTIME AS NEEDED FOR SLEEP  . verapamil (CALAN) 80 MG tablet TAKE 1 TABLET BY MOUTH THREE TIMES DAILY  . progesterone (PROMETRIUM) 100 MG capsule TAKE ONE CAPSULE BY MOUTH EVERY DAY   Allergies  Allergen Reactions  . Augmentin [Amoxicillin-Pot Clavulanate] Diarrhea  . Prozac [Fluoxetine Hcl] Other (See Comments)    Disoriented  . Wellbutrin [Bupropion] Other (See Comments)    Insomnia/Nervousness   Past  Medical History  Diagnosis Date  . Hyperlipidemia   . Hypertension   . Prediabetes   . Migraines   . Allergy   . Anemia   . Anxiety   . Depression   . Lumbar stenosis    Review of Systems   10 point systems review negative except as above.    Objective:   Physical Exam  BP 138/78 mmHg  Pulse 64  Temp(Src) 97.5 F (36.4 C)  Resp 16  Ht 5' 2.5" (1.588 m)  Wt 151 lb 3.2 oz (68.584 kg)  BMI 27.20 kg/m2  HEENT - Eac's patent. TM's Nl. EOM's full. PERRLA. NasoOroPharynx clear. Neck - supple. Nl Thyroid. Carotids 2+ & No bruits, nodes, JVD Chest - Clear equal BS w/o Rales, rhonchi, wheezes. Cor - Nl HS. RRR w/o sig MGR. PP 1(+). No edema. Abd - No palpable organomegaly, masses or tenderness. BS nl. MS- FROM w/o deformities. Muscle power, tone and bulk Nl. Gait Nl. Neuro - No obvious Cr N abnormalities. Sensory, motor and Cerebellar functions appear Nl w/o focal abnormalities. Psyche - Mental status normal & appropriate.  No delusions, ideations or obvious mood abnormalities.    Assessment & Plan:   1. Essential hypertension   2. Anxiety state  - buPROPion (WELLBUTRIN XL) 300 MG 24 hr tablet; Take 1 tablet every morning for mood & concentration  Dispense: 90 tablet; Refill: 1  3. Depression   4. BMI 27.0-27.9,adult

## 2015-03-31 ENCOUNTER — Other Ambulatory Visit: Payer: Self-pay | Admitting: Physician Assistant

## 2015-04-07 ENCOUNTER — Encounter: Payer: Self-pay | Admitting: Internal Medicine

## 2015-04-07 ENCOUNTER — Ambulatory Visit (INDEPENDENT_AMBULATORY_CARE_PROVIDER_SITE_OTHER): Payer: Medicare Other | Admitting: Internal Medicine

## 2015-04-07 VITALS — BP 120/78 | HR 72 | Temp 97.2°F | Resp 16 | Ht 62.5 in | Wt 153.4 lb

## 2015-04-07 DIAGNOSIS — R7303 Prediabetes: Secondary | ICD-10-CM

## 2015-04-07 DIAGNOSIS — R7309 Other abnormal glucose: Secondary | ICD-10-CM

## 2015-04-07 DIAGNOSIS — I10 Essential (primary) hypertension: Secondary | ICD-10-CM

## 2015-04-07 DIAGNOSIS — F329 Major depressive disorder, single episode, unspecified: Secondary | ICD-10-CM

## 2015-04-07 DIAGNOSIS — F32A Depression, unspecified: Secondary | ICD-10-CM

## 2015-04-07 DIAGNOSIS — Z79899 Other long term (current) drug therapy: Secondary | ICD-10-CM | POA: Diagnosis not present

## 2015-04-07 DIAGNOSIS — Z6827 Body mass index (BMI) 27.0-27.9, adult: Secondary | ICD-10-CM

## 2015-04-07 DIAGNOSIS — Z23 Encounter for immunization: Secondary | ICD-10-CM

## 2015-04-07 DIAGNOSIS — E782 Mixed hyperlipidemia: Secondary | ICD-10-CM | POA: Diagnosis not present

## 2015-04-07 DIAGNOSIS — E559 Vitamin D deficiency, unspecified: Secondary | ICD-10-CM

## 2015-04-07 NOTE — Progress Notes (Deleted)
Patient ID: Rebekah Hamilton, female   DOB: [birth year], 69 y.o.   MRN: [id number]

## 2015-04-07 NOTE — Progress Notes (Signed)
Subjective:    Patient ID: Rebekah Hamilton, female    DOB: 1945/08/28, 69 y.o.   MRN: [id number]  HPI This very nice 69 yo MWF with HTN, HLD, PreDM, & Depression returns for 6 week  f/u after tapering her Zoloft to 1/2 tab (+ 25) and increasing her recently started Wellbutrin up to 300 mg daily and reports she feels mentally clear and less depressed . She does allude that he is at peace at work and most of her stressors occur at home, but she seems reluctant to do into any details. She does report recent sneezing & sore throat, but no fever, chills or congestion. Otherwise systems review is completely negative  Medication Sig  . ALPRAZolam (XANAX) 0.25 MG tablet Take 1 tablet (0.25 mg total) by mouth 3 (three) times daily as needed.  . Ascorbic Acid (VITAMIN C PO) 500 mg 2 (two) times daily.  Marland Kitchen aspirin 325 MG tablet Take 325 mg by mouth daily.  Marland Kitchen buPROPion (WELLBUTRIN XL) 300 MG 24 hr tablet Take 1 tablet every morning for mood & concentration  . Cholecalciferol (VITAMIN D PO) Take by mouth. Takes 10000 to 12000 units daily.  Marland Kitchen estradiol (ESTRACE) 0.5 MG tablet TAKE 1 TABLET BY MOUTH EVERY DAY (Patient taking differently: TAKE 1/2 TABLET BY MOUTH EVERY DAY)  . fexofenadine (ALLEGRA) 180 MG tablet Take 180 mg by mouth daily.  . pravastatin (PRAVACHOL) 40 MG tablet TAKE 1 TABLET BY MOUTH EVERY DAY  . progesterone (PROMETRIUM) 100 MG capsule Take 1 capsule (100 mg total) by mouth daily.  . rizatriptan (MAXALT) 10 MG tablet Take 1 tablet (10 mg total) by mouth as needed for migraine. May repeat in 2 hours if needed  . sertraline (ZOLOFT) 100 MG tablet Take 1/4 to 1/2 to 1 tablet at bedtime as directed  . traZODone (DESYREL) 150 MG tablet TAKE 1/3-1/2-1 TABLET BY MOUTH 1 HOUR PRIOR TO BEDTIME AS NEEDED FOR SLEEP  . verapamil (CALAN) 80 MG tablet TAKE 1 TABLET BY MOUTH THREE TIMES DAILY   Allergies  Allergen Reactions  . Augmentin [Amoxicillin-Pot Clavulanate] Diarrhea  . Prozac [Fluoxetine Hcl]  Other (See Comments)    Disoriented  . Wellbutrin [Bupropion] Other (See Comments)    Insomnia/Nervousness   Past Medical History  Diagnosis Date  . Hyperlipidemia   . Hypertension   . Prediabetes   . Migraines   . Allergy   . Anemia   . Anxiety   . Depression   . Lumbar stenosis    Review of Systems  10 point systems review negative except as above.    Objective:   Physical Exam  BP 120/78 mmHg  Pulse 72  Temp(Src) 97.2 F (36.2 C)  Resp 16  Ht 5' 2.5" (1.588 m)  Wt 153 lb 6.4 oz (69.582 kg)  BMI 27.59 kg/m2   HEENT - Eac's patent. TM's Nl. EOM's full. PERRLA. NasoOroPharynx clear. Neck - supple. Nl Thyroid. Carotids 2+ & No bruits, nodes, JVD Chest - Clear equal BS w/o Rales, rhonchi, wheezes. Cor - Nl HS. RRR w/o sig MGR. PP 1(+). No edema. MS- FROM w/o deformities. Muscle power, tone and bulk Nl. Gait Nl. Neuro - No obvious Cr N abnormalities. Sensory, motor and Cerebellar functions appear Nl w/o focal abnormalities. Psyche - Mental status normal & appropriate.  No delusions, ideations or obvious mood abnormalities.    Assessment & Plan:   1. Essential hypertension   2. Depression/anxiety  -continue current meds.   3. Need for  prophylactic vaccination against Streptococcus pneumoniae (pneumococcus)  - Pneumococcal conjugate vaccine 13-valent  - discussed meds & SE's.

## 2015-04-07 NOTE — Patient Instructions (Signed)

## 2015-06-02 ENCOUNTER — Ambulatory Visit (INDEPENDENT_AMBULATORY_CARE_PROVIDER_SITE_OTHER): Payer: Medicare Other | Admitting: Physician Assistant

## 2015-06-02 VITALS — BP 124/64 | HR 66 | Temp 97.0°F | Resp 14 | Ht 62.5 in | Wt 152.0 lb

## 2015-06-02 DIAGNOSIS — F32A Depression, unspecified: Secondary | ICD-10-CM

## 2015-06-02 DIAGNOSIS — M48061 Spinal stenosis, lumbar region without neurogenic claudication: Secondary | ICD-10-CM

## 2015-06-02 DIAGNOSIS — H409 Unspecified glaucoma: Secondary | ICD-10-CM | POA: Insufficient documentation

## 2015-06-02 DIAGNOSIS — E559 Vitamin D deficiency, unspecified: Secondary | ICD-10-CM

## 2015-06-02 DIAGNOSIS — Z79899 Other long term (current) drug therapy: Secondary | ICD-10-CM

## 2015-06-02 DIAGNOSIS — F329 Major depressive disorder, single episode, unspecified: Secondary | ICD-10-CM

## 2015-06-02 DIAGNOSIS — Z23 Encounter for immunization: Secondary | ICD-10-CM

## 2015-06-02 DIAGNOSIS — Z0001 Encounter for general adult medical examination with abnormal findings: Secondary | ICD-10-CM | POA: Diagnosis not present

## 2015-06-02 DIAGNOSIS — Z6827 Body mass index (BMI) 27.0-27.9, adult: Secondary | ICD-10-CM | POA: Diagnosis not present

## 2015-06-02 DIAGNOSIS — G43009 Migraine without aura, not intractable, without status migrainosus: Secondary | ICD-10-CM | POA: Diagnosis not present

## 2015-06-02 DIAGNOSIS — M4806 Spinal stenosis, lumbar region: Secondary | ICD-10-CM

## 2015-06-02 DIAGNOSIS — T7840XD Allergy, unspecified, subsequent encounter: Secondary | ICD-10-CM

## 2015-06-02 DIAGNOSIS — R7309 Other abnormal glucose: Secondary | ICD-10-CM | POA: Diagnosis not present

## 2015-06-02 DIAGNOSIS — I10 Essential (primary) hypertension: Secondary | ICD-10-CM

## 2015-06-02 DIAGNOSIS — E785 Hyperlipidemia, unspecified: Secondary | ICD-10-CM | POA: Diagnosis not present

## 2015-06-02 DIAGNOSIS — D649 Anemia, unspecified: Secondary | ICD-10-CM

## 2015-06-02 DIAGNOSIS — Z789 Other specified health status: Secondary | ICD-10-CM | POA: Diagnosis not present

## 2015-06-02 DIAGNOSIS — Z1159 Encounter for screening for other viral diseases: Secondary | ICD-10-CM | POA: Diagnosis not present

## 2015-06-02 DIAGNOSIS — Z0000 Encounter for general adult medical examination without abnormal findings: Secondary | ICD-10-CM | POA: Insufficient documentation

## 2015-06-02 DIAGNOSIS — F419 Anxiety disorder, unspecified: Secondary | ICD-10-CM

## 2015-06-02 DIAGNOSIS — R6889 Other general symptoms and signs: Secondary | ICD-10-CM | POA: Diagnosis not present

## 2015-06-02 LAB — CBC WITH DIFFERENTIAL/PLATELET
Basophils Absolute: 0.1 10*3/uL (ref 0.0–0.1)
Basophils Relative: 1 % (ref 0–1)
Eosinophils Absolute: 0.2 10*3/uL (ref 0.0–0.7)
Eosinophils Relative: 2 % (ref 0–5)
HEMATOCRIT: 43.7 % (ref 36.0–46.0)
Hemoglobin: 14.7 g/dL (ref 12.0–15.0)
LYMPHS ABS: 2.4 10*3/uL (ref 0.7–4.0)
Lymphocytes Relative: 28 % (ref 12–46)
MCH: 30.1 pg (ref 26.0–34.0)
MCHC: 33.6 g/dL (ref 30.0–36.0)
MCV: 89.5 fL (ref 78.0–100.0)
MONOS PCT: 9 % (ref 3–12)
MPV: 9.4 fL (ref 8.6–12.4)
Monocytes Absolute: 0.8 10*3/uL (ref 0.1–1.0)
NEUTROS PCT: 60 % (ref 43–77)
Neutro Abs: 5.1 10*3/uL (ref 1.7–7.7)
Platelets: 319 10*3/uL (ref 150–400)
RBC: 4.88 MIL/uL (ref 3.87–5.11)
RDW: 13.6 % (ref 11.5–15.5)
WBC: 8.5 10*3/uL (ref 4.0–10.5)

## 2015-06-02 MED ORDER — PRAVASTATIN SODIUM 40 MG PO TABS: 40.0000 mg | ORAL_TABLET | Freq: Every day | ORAL | Status: DC

## 2015-06-02 MED ORDER — ESTRADIOL 0.5 MG PO TABS: 0.5000 mg | ORAL_TABLET | Freq: Every day | ORAL | Status: DC

## 2015-06-02 NOTE — Progress Notes (Signed)
MEDICARE ANNUAL WELLNESS VISIT AND CPE  Assessment:   1. Essential hypertension - continue medications, DASH diet, exercise and monitor at home. Call if greater than 130/80.  - CBC with Differential/Platelet - BASIC METABOLIC PANEL WITH GFR - Hepatic function panel - TSH - Urinalysis, Routine w reflex microscopic (not at [facility]) - Microalbumin / creatinine urine ratio - EKG 12-Lead  2. Other abnormal glucose Discussed general issues about diabetes pathophysiology and management., Educational material distributed., Suggested low cholesterol diet., Encouraged aerobic exercise., Discussed foot care., Reminded to get yearly retinal exam. - Hemoglobin A1c - Insulin, fasting  3. Hyperlipidemia - pravastatin (PRAVACHOL) 40 MG tablet; Take 1 tablet (40 mg total) by mouth daily.  Dispense: 90 tablet; Refill: 0 - Lipid panel  4. Depression, controlled Continue medications, suggest counseling  5. Vitamin D deficiency Continue supplement  6. Encounter for long-term (current) use of medications - Magnesium  7. Migraine without aura and without status migrainosus, not intractable Avoid triggers, normal neuro  8. Lumbar stenosis monitor  9. Anemia, unspecified anemia type - Iron and TIBC - Ferritin - Vitamin B12  10. Allergy, subsequent encounter Continue OTC allergy pills  11. Anxiety continue medications, stress management techniques discussed, increase water, good sleep hygiene discussed, increase exercise, and increase veggies.   12. Encounter for Medicare annual wellness exam Shingles today, DUE for colonoscopy, given number to call.   13. Glaucoma Follow up eye doctor  14. Need for prophylactic vaccination and inoculation against influenza - Flu vaccine HIGH DOSE PF (Fluzone High dose)  15. BMI 27.0-27.9,adult Obesity with co morbidities- long discussion about weight loss, diet, and exercise  16. Screening for viral disease - Hepatitis C antibody  17. Seb  dermatitis Can schedule for freezing.   Over 40 minutes of exam, counseling, chart review and critical decision making was performed  Plan:   During the course of the visit the patient was educated and counseled about appropriate screening and preventive services including:    Pneumococcal vaccine   Prevnar 13  Influenza vaccine  Td vaccine  Screening electrocardiogram  Bone densitometry screening  Colorectal cancer screening  Diabetes screening  Glaucoma screening  Nutrition counseling   Advanced directives: requested  Conditions/risks identified: BMI: Discussed weight loss, diet, and increase physical activity.  Increase physical activity: AHA recommends 150 minutes of physical activity a week.  Medications reviewed Diabetes is at goal, ACE/ARB therapy: No, Reason not on Ace Inhibitor/ARB therapy:  preDM Urinary Incontinence is not an issue: discussed non pharmacology and pharmacology options.  Fall risk: low- discussed PT, home fall assessment, medications.    Subjective:  Rebekah Hamilton is a 69 y.o. female who presents for Medicare Annual Wellness Visit and complete physical.  Date of last medicare wellness visit was 10/2013   Her blood pressure has been controlled at home, today their BP is BP: 124/64 mmHg She does not workout. She denies chest pain, shortness of breath, dizziness.  She is on cholesterol medication and denies myalgias. Her cholesterol is at goal. The cholesterol last visit was:   Lab Results  Component Value Date   CHOL 145 02/03/2015   HDL 52 02/03/2015   LDLCALC 69 02/03/2015   TRIG 121 02/03/2015   CHOLHDL 2.8 02/03/2015    She has been working on diet and exercise for prediabetes, and denies paresthesia of the feet, polydipsia, polyuria and visual disturbances. Last A1C in the office was:  Lab Results  Component Value Date   HGBA1C 5.8* 02/03/2015   Patient  is on Vitamin D supplement.   Lab Results  Component Value Date    VD25OH 24* 02/03/2015     She has anxiety and insomnia, she has been having trouble with her husband, her mother is sick in [place] and she has been going back and forth, and her zoloft was cut down to 25 mg and wellbutrin 300mg  was added. She was seeing a psycologist for counseling at one point but then her medicare was not covering it. She states she is forgetting things, she will try to name something but can not think of the word, misplacing paper at work.  She has some areas on her hands, skin lesions that are new, has had before but went away. Not itchy.  She was having palpitations but is taking 1 verapamil at night which has helped in addition to the stress management.  She has a history of migraines, takes maxalt PRN which helps.  She is on Estrogen and a BASA.  BMI is Body mass index is 27.34 kg/(m^2)., she is working on diet and exercise. Wt Readings from Last 3 Encounters:  06/02/15 152 lb (68.947 kg)  04/07/15 153 lb 6.4 oz (69.582 kg)  02/23/15 151 lb 3.2 oz (68.584 kg)    Medication Review: Current Outpatient Prescriptions on File Prior to Visit  Medication Sig Dispense Refill  . ALPRAZolam (XANAX) 0.25 MG tablet Take 1 tablet (0.25 mg total) by mouth 3 (three) times daily as needed. 90 tablet 5  . Ascorbic Acid (VITAMIN C PO) 500 mg 2 (two) times daily.    Marland Kitchen aspirin 325 MG tablet Take 325 mg by mouth daily.    Marland Kitchen buPROPion (WELLBUTRIN XL) 300 MG 24 hr tablet Take 1 tablet every morning for mood & concentration 90 tablet 1  . Cholecalciferol (VITAMIN D PO) Take by mouth. Takes 10000 to 12000 units daily.    . fexofenadine (ALLEGRA) 180 MG tablet Take 180 mg by mouth daily.    . progesterone (PROMETRIUM) 100 MG capsule Take 1 capsule (100 mg total) by mouth daily. 90 capsule 3  . rizatriptan (MAXALT) 10 MG tablet Take 1 tablet (10 mg total) by mouth as needed for migraine. May repeat in 2 hours if needed 12 tablet 99  . sertraline (ZOLOFT) 100 MG tablet Take 1/4 to 1/2 to 1  tablet at bedtime as directed 90 tablet 3  . traZODone (DESYREL) 150 MG tablet TAKE 1/3-1/2-1 TABLET BY MOUTH 1 HOUR PRIOR TO BEDTIME AS NEEDED FOR SLEEP 30 tablet 1  . verapamil (CALAN) 80 MG tablet TAKE 1 TABLET BY MOUTH THREE TIMES DAILY 270 tablet 0   No current facility-administered medications on file prior to visit.    Current Problems (verified) Patient Active Problem List   Diagnosis Date Noted  . Encounter for Medicare annual wellness exam 06/02/2015  . Glaucoma 06/02/2015  . Vitamin D deficiency 10/21/2014  . Encounter for long-term (current) use of medications 12/01/2013  . Hyperlipidemia   . Hypertension   . Other abnormal glucose   . Migraines   . Allergy   . Anemia   . Anxiety   . Depression, controlled   . Lumbar stenosis     Screening Tests Immunization History  Administered Date(s) Administered  . Influenza, High Dose Seasonal PF 05/29/2014, 06/02/2015  . Influenza,inj,quad, With Preservative 08/12/2013  . Pneumococcal Conjugate-13 04/07/2015  . Pneumococcal Polysaccharide-23 05/29/2013  . Td 08/15/2005    Preventative care: Last colonoscopy: 2006 DUE this year Last mammogram: 08/2013, CAT B Last  pap smear/pelvic exam: 2014 negative [place] AB 2008 MRI lumbar 02/2014 CXR 2008 DEXA: 2000, declines Results for orders placed during the hospital encounter of 08/26/13  MM Digital Screening   Narrative CLINICAL DATA:  Screening.  EXAM: DIGITAL SCREENING BILATERAL MAMMOGRAM WITH CAD  COMPARISON:  07/06/2012, 02/21/2011, 08/03/2009, 08/01/2008, 07/31/2007  ACR Breast Density Category b: There are scattered areas of fibroglandular density.  FINDINGS: There are no findings suspicious for malignancy. Images were processed with CAD.  IMPRESSION: No mammographic evidence of malignancy. A result letter of this screening mammogram will be mailed directly to the patient.  RECOMMENDATION: Screening mammogram in one year. (Code:SM-B-01Y)  BI-RADS CATEGORY   1: Negative   Electronically Signed   By: Ulyess Blossom M.D.   On: 08/26/2013 12:15     Prior vaccinations: TD or Tdap: 2007  Influenza: TODAY Pneumococcal: 2014 Prevnar13: 2016 Shingles/Zostavax: check price  Names of Other Physician/Practitioners you currently use: 1. [facility] here for primary care 2. [facility], eye doctor, last visit Aug 2015, DUE 3. Dr. Evelene Croon, dentist, last visit 06//2016 Patient Care Team: Unk Pinto, MD as PCP - General (Internal Medicine) Garald Balding, MD as Consulting Physician (Orthopedic Surgery) Laurence Spates, MD as Consulting Physician (Gastroenterology) Avon Gully, NP as Nurse Practitioner (Obstetrics and Gynecology)  SURGICAL HISTORY Past Surgical History  Procedure Laterality Date  . Cholecystectomy  1999   FAMILY HISTORY Family History  Problem Relation Age of Onset  . Stroke Mother   . Hypertension Mother   . Heart attack Father    SOCIAL HISTORY Social History  Substance Use Topics  . Smoking status: Never Smoker   . Smokeless tobacco: Not on file  . Alcohol Use: 1.2 oz/week    2 Standard drinks or equivalent per week     Comment: Rare-Wine    MEDICARE WELLNESS OBJECTIVES: Tobacco use: She does not smoke.  Patient is not a former smoker. Alcohol Current alcohol use: glass of wine with dinner Osteoporosis: postmenopausal estrogen deficiency and dietary calcium and/or vitamin D deficiency, History of fracture in the past year: no Fall risk: Low Risk Hearing: normal Visual acuity: normal,  does perform annual eye exam Diet: in general, a "healthy" diet   Physical activity: Current Exercise Habits:: The patient does not participate in regular exercise at present Cardiac risk factors: Cardiac Risk Factors include: advanced age (>22men, >40 women);dyslipidemia;sedentary lifestyle;hypertension Depression/mood screen:   Depression screen [facility] 2/9 06/02/2015   Decreased Interest 1  Down, Depressed, Hopeless 1  PHQ - 2 Score 2  Altered sleeping 1  Tired, decreased energy 1  Change in appetite 1  Feeling bad or failure about yourself  0  Trouble concentrating 2  Moving slowly or fidgety/restless 0  Suicidal thoughts 0  PHQ-9 Score 7  Difficult doing work/chores Somewhat difficult    ADLs:  In your present state of health, do you have any difficulty performing the following activities: 06/02/2015  Hearing? Y  Vision? N  Difficulty concentrating or making decisions? Y  Walking or climbing stairs? N  Dressing or bathing? N  Doing errands, shopping? N  Preparing Food and eating ? N  Using the Toilet? N  In the past six months, have you accidently leaked urine? N  Do you have problems with loss of bowel control? N  Managing your Medications? N  Managing your Finances? N  Housekeeping or managing your Housekeeping? N     Cognitive Testing  Alert? Yes  Normal Appearance?Yes  Oriented to person? Yes  Place? Yes   Time? Yes  Recall of three objects?  Yes  Can perform simple calculations? Yes  Displays appropriate judgment?Yes  Can read the correct time from a watch face?Yes  EOL planning: Does patient have an advance directive?: No Would patient like information on creating an advanced directive?: Yes - Educational materials given  Review of Systems  Constitutional: Negative for fever, chills and diaphoresis.  HENT: Negative for congestion, ear discharge, ear pain, hearing loss, nosebleeds, sore throat and tinnitus.   Eyes: Negative.   Respiratory: Negative.  Negative for shortness of breath and stridor.   Cardiovascular: Negative for chest pain, palpitations, orthopnea, claudication, leg swelling and PND.  Gastrointestinal: Negative.  Negative for diarrhea and constipation.  Genitourinary: Negative.   Musculoskeletal: Positive for myalgias. Negative for back pain, joint pain, falls and neck pain.  Skin: Negative.   Neurological:  Positive for headaches. Negative for dizziness, tingling, tremors, sensory change, speech change, focal weakness, seizures, loss of consciousness and weakness.  Endo/Heme/Allergies: Negative.   Psychiatric/Behavioral: Positive for depression and memory loss. Negative for suicidal ideas, hallucinations and substance abuse. The patient is nervous/anxious and has insomnia.      Objective:     Today's Vitals   06/02/15 0915  BP: 124/64  Pulse: 66  Temp: 97 F (36.1 C)  TempSrc: Temporal  Resp: 14  Height: 5' 2.5" (1.588 m)  Weight: 152 lb (68.947 kg)  SpO2: 97%   Body mass index is 27.34 kg/(m^2).  General appearance: alert, no distress, WD/WN, female HEENT: normocephalic, sclerae anicteric, TMs pearly, nares patent, no discharge or erythema, pharynx normal Oral cavity: MMM, no lesions Neck: supple, no lymphadenopathy, no thyromegaly, no masses Heart: RRR, normal S1, S2, no murmurs Lungs: CTA bilaterally, no wheezes, rhonchi, or rales Abdomen: +bs, soft, non tender, non distended, no masses, no hepatomegaly, no splenomegaly Musculoskeletal: nontender, no swelling, no obvious deformity Extremities: no edema, no cyanosis, no clubbing Pulses: 2+ symmetric, upper and lower extremities, normal cap refill Neurological: alert, oriented x 3, CN2-12 intact, strength normal upper extremities and lower extremities, sensation normal throughout, DTRs 2+ throughout, no cerebellar signs, gait normal Psychiatric: normal affect, behavior normal, pleasant   EKG: NSR, no ST changes.   Medicare Attestation I have personally reviewed: The patient's medical and social history Their use of alcohol, tobacco or illicit drugs Their current medications and supplements The patient's functional ability including ADLs,fall risks, home safety risks, cognitive, and hearing and visual impairment Diet and physical activities Evidence for depression or mood disorders  The patient's weight, height, BMI, and  visual acuity have been recorded in the chart.  I have made referrals, counseling, and provided education to the patient based on review of the above and I have provided the patient with a written personalized care plan for preventive services.     Vicie Mutters, PA-C   06/02/2015

## 2015-06-02 NOTE — Patient Instructions (Addendum)
[facility]  7 a.m.-6:30 p.m., Monday 7 a.m.-5 p.m., Tuesday-Friday Schedule an appointment by calling [phone].  Call Dr. Oletta Lamas for colonoscopy, due this year Phone: [phone];   [employer] Test with no obligation # [phone] Do not have to be a member Tues-Sat 10-6  [place]- free test with no obligation # [phone] MUST BE A MEMBER Call for store hours  Hand Dermatitis Hand dermatitis (dyshidrotic eczema) is a skin condition in which small,  raised dots or fluid-filled blisters form over the palms of the hands. Outbreaks of hand dermatitis can last 3 to 4 weeks. CAUSES  The cause of hand dermatitis is unknown. However, it occurs most often in patients with a history of allergies such as:  Hay fever.  Allergic asthma.  Allergies to latex. Chemical exposure, injuries, and environmental irritants can make hand dermatitis worse. Washing your hands too frequently can remove natural oils, which can dry out the skin and contribute to outbreaks of hand dermatitis. SYMPTOMS  Cracks or grooves (fissures) on the fingers can also develop. Affected areas can be painful, especially areas where large blisters have formed. DIAGNOSIS Your caregiver can usually tell what the problem is by doing a physical exam. PREVENTION  Avoid excessive hand washing.  Avoid the use of harsh chemicals.  Wear protective gloves when handling products that can irritate your skin. TREATMENT  Steroid creams and ointments, such as over-the-counter 1% hydrocortisone cream, can reduce inflammation and improve moisture retention. These should be applied at least 2 to 4 times per day. Your caregiver may ask you to use a stronger prescription steroid cream to help speed the healing of blistered and cracked skin. In severe cases, oral steroid medicine may be needed. If you have an infection, antibiotics may be needed.  SEEK MEDICAL CARE IF:  The  rash is not better after 1 week of treatment.  Signs of infection develop, such as redness, tenderness, or yellowish-white fluid (pus).  The rash is spreading.   This information is not intended to replace advice given to you by your health care provider. Make sure you discuss any questions you have with your health care provider.   Document Released: 08/01/2005 Document Revised: 10/24/2011 Document Reviewed: 02/13/2015 Elsevier Interactive Patient Education [employer].  I think it is possible that you have sleep apnea. It can cause interrupted sleep, headaches, frequent awakenings, fatigue, dry mouth, fast/slow heart beats, memory issues, anxiety/depression, swelling, numbness tingling hands/feet, weight gain, shortness of breath, and the list goes on. Sleep apnea needs to be ruled out because if it is left untreated it does eventually lead to abnormal heart beats, lung failure or heart failure as well as increasing the risk of heart attack and stroke. There are masks you can wear OR a mouth piece that I can give you information about. Often times though people feel MUCH better after getting treatment.   Sleep Apnea  Sleep apnea is a sleep disorder characterized by abnormal pauses in breathing while you sleep. When your breathing pauses, the level of oxygen in your blood decreases. This causes you to move out of deep sleep and into light sleep. As a result, your quality of sleep is poor, and the system that carries your blood throughout your body (cardiovascular system) experiences stress. If sleep apnea remains untreated, the following conditions can develop:  High blood pressure (hypertension).  Coronary artery disease.  Inability to achieve or maintain an erection (impotence).  Impairment of your thought process (cognitive dysfunction). There are three types of sleep apnea: 1. Obstructive sleep apnea--Pauses in breathing during sleep because of a blocked airway. 2. Central sleep  apnea--Pauses in breathing during sleep because the area of the brain that controls your breathing does not send the correct signals to the muscles that control breathing. 3. Mixed sleep apnea--A combination of both obstructive and central sleep apnea.  RISK FACTORS The following risk factors can increase your risk of developing sleep apnea:  Being overweight.  Smoking.  Having narrow passages in your nose and throat.  Being of older age.  Being female.  Alcohol use.  Sedative and tranquilizer use.  Ethnicity. Among individuals younger than 35 years, African Americans are at increased risk of sleep apnea. SYMPTOMS   Difficulty staying asleep.  Daytime sleepiness and fatigue.  Loss of energy.  Irritability.  Loud, heavy snoring.  Morning headaches.  Trouble concentrating.  Forgetfulness.  Decreased interest in sex. DIAGNOSIS  In order to diagnose sleep apnea, your caregiver will perform a physical examination. Your caregiver may suggest that you take a home sleep test. Your caregiver may also recommend that you spend the night in a sleep lab. In the sleep lab, several monitors record information about your heart, lungs, and brain while you sleep. Your leg and arm movements and blood oxygen level are also recorded. TREATMENT The following actions may help to resolve mild sleep apnea:  Sleeping on your side.   Using a decongestant if you have nasal congestion.   Avoiding the use of depressants, including alcohol, sedatives, and narcotics.   Losing weight and modifying your diet if you are overweight. There also are devices and treatments to help open your airway:  Oral appliances. These are custom-made mouthpieces that shift your lower jaw forward and slightly open your bite. This opens your airway.  Devices that create positive airway pressure. This positive pressure "splints" your airway open to help you breathe better during sleep. The following devices create  positive airway pressure:  Continuous positive airway pressure (CPAP) device. The CPAP device creates a continuous level of air pressure with an air pump. The air is delivered to your airway through a mask while you sleep. This continuous pressure keeps your airway open.  Nasal expiratory positive airway pressure (EPAP) device. The EPAP device creates positive air pressure as you exhale. The device consists of single-use valves, which are inserted into each nostril and held in place by adhesive. The valves create very little resistance when you inhale but create much more resistance when you exhale. That increased resistance creates the positive airway pressure. This positive pressure while you exhale keeps your airway open, making it easier to breath when you inhale again.  Bilevel positive airway pressure (BPAP) device. The BPAP device is used mainly in patients with central sleep apnea. This device is similar to the CPAP device because it also uses an air pump to deliver continuous air pressure through a mask. However, with the BPAP machine, the pressure is set at two different levels. The pressure when you exhale is lower than the pressure when you inhale.  Surgery. Typically, surgery is only done if you cannot comply with less invasive treatments or if the less invasive treatments do not improve your condition. Surgery involves removing excess tissue in your airway to create a wider passage way. Document Released: 07/22/2002 Document Revised: 11/26/2012 Document Reviewed: 12/08/2011 [facility] Patient Information 2015 [place], [place]. This information is not intended to replace advice given  to you by your health care provider. Make sure you discuss any questions you have with your health care provider.   Preventive Care for Adults A healthy lifestyle and preventive care can promote health and wellness. Preventive health guidelines for women include the following key practices.  A routine yearly  physical is a good way to check with your health care provider about your health and preventive screening. It is a chance to share any concerns and updates on your health and to receive a thorough exam.  Visit your dentist for a routine exam and preventive care every 6 months. Brush your teeth twice a day and floss once a day. Good oral hygiene prevents tooth decay and gum disease.  The frequency of eye exams is based on your age, health, family medical history, use of contact lenses, and other factors. Follow your health care provider's recommendations for frequency of eye exams.  Eat a healthy diet. Foods like vegetables, fruits, whole grains, low-fat dairy products, and lean protein foods contain the nutrients you need without too many calories. Decrease your intake of foods high in solid fats, added sugars, and salt. Eat the right amount of calories for you.Get information about a proper diet from your health care provider, if necessary.  Regular physical exercise is one of the most important things you can do for your health. Most adults should get at least 150 minutes of moderate-intensity exercise (any activity that increases your heart rate and causes you to sweat) each week. In addition, most adults need muscle-strengthening exercises on 2 or more days a week.  Maintain a healthy weight. The body mass index (BMI) is a screening tool to identify possible weight problems. It provides an estimate of body fat based on height and weight. Your health care provider can find your BMI and can help you achieve or maintain a healthy weight.For adults 20 years and older:  A BMI below 18.5 is considered underweight.  A BMI of 18.5 to 24.9 is normal.  A BMI of 25 to 29.9 is considered overweight.  A BMI of 30 and above is considered obese.  Maintain normal blood lipids and cholesterol levels by exercising and minimizing your intake of saturated fat. Eat a balanced diet with plenty of fruit and  vegetables. If your lipid or cholesterol levels are high, you are over 50, or you are at high risk for heart disease, you may need your cholesterol levels checked more frequently.Ongoing high lipid and cholesterol levels should be treated with medicines if diet and exercise are not working.  If you smoke, find out from your health care provider how to quit. If you do not use tobacco, do not start.  Lung cancer screening is recommended for adults aged 65-80 years who are at high risk for developing lung cancer because of a history of smoking. A yearly low-dose CT scan of the lungs is recommended for people who have at least a 30-pack-year history of smoking and are a current smoker or have quit within the past 15 years. A pack year of smoking is smoking an average of 1 pack of cigarettes a day for 1 year (for example: 1 pack a day for 30 years or 2 packs a day for 15 years). Yearly screening should continue until the smoker has stopped smoking for at least 15 years. Yearly screening should be stopped for people who develop a health problem that would prevent them from having lung cancer treatment.  Avoid use of street drugs. Do  not share needles with anyone. Ask for help if you need support or instructions about stopping the use of drugs.  High blood pressure causes heart disease and increases the risk of stroke.  Ongoing high blood pressure should be treated with medicines if weight loss and exercise do not work.  If you are 32-25 years old, ask your health care provider if you should take aspirin to prevent strokes.  Diabetes screening involves taking a blood sample to check your fasting blood sugar level. This should be done once every 3 years, after age 89, if you are within normal weight and without risk factors for diabetes. Testing should be considered at a younger age or be carried out more frequently if you are overweight and have at least 1 risk factor for diabetes.  Breast cancer screening  is essential preventive care for women. You should practice "breast self-awareness." This means understanding the normal appearance and feel of your breasts and may include breast self-examination. Any changes detected, no matter how small, should be reported to a health care provider. Women in their 78s and 30s should have a clinical breast exam (CBE) by a health care provider as part of a regular health exam every 1 to 3 years. After age 9, women should have a CBE every year. Starting at age 65, women should consider having a mammogram (breast X-ray test) every year. Women who have a family history of breast cancer should talk to their health care provider about genetic screening. Women at a high risk of breast cancer should talk to their health care providers about having an MRI and a mammogram every year.  Breast cancer gene (BRCA)-related cancer risk assessment is recommended for women who have family members with BRCA-related cancers. BRCA-related cancers include breast, ovarian, tubal, and peritoneal cancers. Having family members with these cancers may be associated with an increased risk for harmful changes (mutations) in the breast cancer genes BRCA1 and BRCA2. Results of the assessment will determine the need for genetic counseling and BRCA1 and BRCA2 testing.  Routine pelvic exams to screen for cancer are no longer recommended for nonpregnant women who are considered low risk for cancer of the pelvic organs (ovaries, uterus, and vagina) and who do not have symptoms. Ask your health care provider if a screening pelvic exam is right for you.  If you have had past treatment for cervical cancer or a condition that could lead to cancer, you need Pap tests and screening for cancer for at least 20 years after your treatment. If Pap tests have been discontinued, your risk factors (such as having a new sexual partner) need to be reassessed to determine if screening should be resumed. Some women have  medical problems that increase the chance of getting cervical cancer. In these cases, your health care provider may recommend more frequent screening and Pap tests.    Colorectal cancer can be detected and often prevented. Most routine colorectal cancer screening begins at the age of 32 years and continues through age 20 years. However, your health care provider may recommend screening at an earlier age if you have risk factors for colon cancer. On a yearly basis, your health care provider may provide home test kits to check for hidden blood in the stool. Use of a small camera at the end of a tube, to directly examine the colon (sigmoidoscopy or colonoscopy), can detect the earliest forms of colorectal cancer. Talk to your health care provider about this at age 6, when routine  screening begins. Direct exam of the colon should be repeated every 5-10 years through age 68 years, unless early forms of pre-cancerous polyps or small growths are found.  Osteoporosis is a disease in which the bones lose minerals and strength with aging. This can result in serious bone fractures or breaks. The risk of osteoporosis can be identified using a bone density scan. Women ages 59 years and over and women at risk for fractures or osteoporosis should discuss screening with their health care providers. Ask your health care provider whether you should take a calcium supplement or vitamin D to reduce the rate of osteoporosis.  Menopause can be associated with physical symptoms and risks. Hormone replacement therapy is available to decrease symptoms and risks. You should talk to your health care provider about whether hormone replacement therapy is right for you.  Use sunscreen. Apply sunscreen liberally and repeatedly throughout the day. You should seek shade when your shadow is shorter than you. Protect yourself by wearing long sleeves, pants, a wide-brimmed hat, and sunglasses year round, whenever you are outdoors.  Once  a month, do a whole body skin exam, using a mirror to look at the skin on your back. Tell your health care provider of new moles, moles that have irregular borders, moles that are larger than a pencil eraser, or moles that have changed in shape or color.  Stay current with required vaccines (immunizations).  Influenza vaccine. All adults should be immunized every year.  Tetanus, diphtheria, and acellular pertussis (Td, Tdap) vaccine. Pregnant women should receive 1 dose of Tdap vaccine during each pregnancy. The dose should be obtained regardless of the length of time since the last dose. Immunization is preferred during the 27th-36th week of gestation. An adult who has not previously received Tdap or who does not know her vaccine status should receive 1 dose of Tdap. This initial dose should be followed by tetanus and diphtheria toxoids (Td) booster doses every 10 years. Adults with an unknown or incomplete history of completing a 3-dose immunization series with Td-containing vaccines should begin or complete a primary immunization series including a Tdap dose. Adults should receive a Td booster every 10 years.    Zoster vaccine. One dose is recommended for adults aged 42 years or older unless certain conditions are present.    Pneumococcal 13-valent conjugate (PCV13) vaccine. When indicated, a person who is uncertain of her immunization history and has no record of immunization should receive the PCV13 vaccine. An adult aged 64 years or older who has certain medical conditions and has not been previously immunized should receive 1 dose of PCV13 vaccine. This PCV13 should be followed with a dose of pneumococcal polysaccharide (PPSV23) vaccine. The PPSV23 vaccine dose should be obtained at least 8 weeks after the dose of PCV13 vaccine. An adult aged 33 years or older who has certain medical conditions and previously received 1 or more doses of PPSV23 vaccine should receive 1 dose of PCV13. The PCV13  vaccine dose should be obtained 1 or more years after the last PPSV23 vaccine dose.    Pneumococcal polysaccharide (PPSV23) vaccine. When PCV13 is also indicated, PCV13 should be obtained first. All adults aged 59 years and older should be immunized. An adult younger than age 61 years who has certain medical conditions should be immunized. Any person who resides in a nursing home or long-term care facility should be immunized. An adult smoker should be immunized. People with an immunocompromised condition and certain other conditions should receive  both PCV13 and PPSV23 vaccines. People with human immunodeficiency virus (HIV) infection should be immunized as soon as possible after diagnosis. Immunization during chemotherapy or radiation therapy should be avoided. Routine use of PPSV23 vaccine is not recommended for American Indians, [place] Natives, or people younger than 65 years unless there are medical conditions that require PPSV23 vaccine. When indicated, people who have unknown immunization and have no record of immunization should receive PPSV23 vaccine. One-time revaccination 5 years after the first dose of PPSV23 is recommended for people aged 19-64 years who have chronic kidney failure, nephrotic syndrome, asplenia, or immunocompromised conditions. People who received 1-2 doses of PPSV23 before age 47 years should receive another dose of PPSV23 vaccine at age 74 years or later if at least 5 years have passed since the previous dose. Doses of PPSV23 are not needed for people immunized with PPSV23 at or after age 36 years.   Preventive Services / Frequency  Ages 21 years and over  Blood pressure check.  Lipid and cholesterol check.  Lung cancer screening. / Every year if you are aged 23-80 years and have a 30-pack-year history of smoking and currently smoke or have quit within the past 15 years. Yearly screening is stopped once you have quit smoking for at least 15 years or develop a health  problem that would prevent you from having lung cancer treatment.  Clinical breast exam.** / Every year after age 36 years.  BRCA-related cancer risk assessment.** / For women who have family members with a BRCA-related cancer (breast, ovarian, tubal, or peritoneal cancers).  Mammogram.** / Every year beginning at age 47 years and continuing for as long as you are in good health. Consult with your health care provider.  Pap test.** / Every 3 years starting at age 31 years through age 51 or 59 years with 3 consecutive normal Pap tests. Testing can be stopped between 65 and 70 years with 3 consecutive normal Pap tests and no abnormal Pap or HPV tests in the past 10 years.  Fecal occult blood test (FOBT) of stool. / Every year beginning at age 73 years and continuing until age 50 years. You may not need to do this test if you get a colonoscopy every 10 years.  Flexible sigmoidoscopy or colonoscopy.** / Every 5 years for a flexible sigmoidoscopy or every 10 years for a colonoscopy beginning at age 57 years and continuing until age 60 years.  Hepatitis C blood test.** / For all people born from [birth year] through 1964 and any individual with known risks for hepatitis C.  Osteoporosis screening.** / A one-time screening for women ages 30 years and over and women at risk for fractures or osteoporosis.  Skin self-exam. / Monthly.  Influenza vaccine. / Every year.  Tetanus, diphtheria, and acellular pertussis (Tdap/Td) vaccine.** / 1 dose of Td every 10 years.  Zoster vaccine.** / 1 dose for adults aged 43 years or older.  Pneumococcal 13-valent conjugate (PCV13) vaccine.** / Consult your health care provider.  Pneumococcal polysaccharide (PPSV23) vaccine.** / 1 dose for all adults aged 76 years and older. Screening for abdominal aortic aneurysm (AAA)  by ultrasound is recommended for people who have history of high blood pressure or who are current or former smokers.

## 2015-06-03 ENCOUNTER — Other Ambulatory Visit: Payer: Self-pay | Admitting: Physician Assistant

## 2015-06-03 DIAGNOSIS — F411 Generalized anxiety disorder: Secondary | ICD-10-CM

## 2015-06-03 LAB — BASIC METABOLIC PANEL WITH GFR
BUN: 14 mg/dL (ref 7–25)
CHLORIDE: 106 mmol/L (ref 98–110)
CO2: 24 mmol/L (ref 20–31)
CREATININE: 0.86 mg/dL (ref 0.50–0.99)
Calcium: 9.7 mg/dL (ref 8.6–10.4)
GFR, EST AFRICAN AMERICAN: 80 mL/min (ref >=60)
GFR, Est Non African American: 70 mL/min (ref >=60)
Glucose, Bld: 82 mg/dL (ref 65–99)
Potassium: 4.9 mmol/L (ref 3.5–5.3)
Sodium: 142 mmol/L (ref 135–146)

## 2015-06-03 LAB — FERRITIN: FERRITIN: 95 ng/mL (ref 10–291)

## 2015-06-03 LAB — IRON AND TIBC
%SAT: 29 % (ref 11–50)
IRON: 96 ug/dL (ref 45–160)
TIBC: 332 ug/dL (ref 250–450)
UIBC: 236 ug/dL (ref 125–400)

## 2015-06-03 LAB — URINALYSIS, ROUTINE W REFLEX MICROSCOPIC
BILIRUBIN URINE: NEGATIVE
Glucose, UA: NEGATIVE
Hgb urine dipstick: NEGATIVE
KETONES UR: NEGATIVE
Leukocytes, UA: NEGATIVE
Nitrite: NEGATIVE
PH: 5 (ref 5.0–8.0)
Protein, ur: NEGATIVE
SPECIFIC GRAVITY, URINE: 1.022 (ref 1.001–1.035)

## 2015-06-03 LAB — TSH: TSH: 2.246 u[IU]/mL (ref 0.350–4.500)

## 2015-06-03 LAB — HEPATITIS C ANTIBODY: HCV Ab: NEGATIVE

## 2015-06-03 LAB — LIPID PANEL
CHOLESTEROL: 147 mg/dL (ref 125–200)
HDL: 51 mg/dL (ref >=46)
LDL CALC: 75 mg/dL (ref <130)
TRIGLYCERIDES: 106 mg/dL (ref <150)
Total CHOL/HDL Ratio: 2.9 Ratio (ref <=5.0)
VLDL: 21 mg/dL (ref <30)

## 2015-06-03 LAB — HEMOGLOBIN A1C
Hgb A1c MFr Bld: 5.7 % — ABNORMAL HIGH (ref <5.7)
Mean Plasma Glucose: 117 mg/dL — ABNORMAL HIGH (ref <117)

## 2015-06-03 LAB — MICROALBUMIN / CREATININE URINE RATIO
CREATININE, URINE: 183 mg/dL (ref 20–320)
MICROALB UR: 1.4 mg/dL
Microalb Creat Ratio: 8 mcg/mg creat (ref <30)

## 2015-06-03 LAB — HEPATIC FUNCTION PANEL
ALK PHOS: 44 U/L (ref 33–130)
ALT: 17 U/L (ref 6–29)
AST: 21 U/L (ref 10–35)
Albumin: 4.4 g/dL (ref 3.6–5.1)
BILIRUBIN TOTAL: 0.4 mg/dL (ref 0.2–1.2)
Bilirubin, Direct: 0.1 mg/dL (ref <=0.2)
Indirect Bilirubin: 0.3 mg/dL (ref 0.2–1.2)
Total Protein: 6.9 g/dL (ref 6.1–8.1)

## 2015-06-03 LAB — VITAMIN B12: Vitamin B-12: 406 pg/mL (ref 211–911)

## 2015-06-03 LAB — INSULIN, FASTING: Insulin fasting, serum: 8.9 u[IU]/mL (ref 2.0–19.6)

## 2015-06-03 LAB — MAGNESIUM: Magnesium: 2.2 mg/dL (ref 1.5–2.5)

## 2015-06-03 MED ORDER — ALPRAZOLAM 0.25 MG PO TABS: 0.2500 mg | ORAL_TABLET | Freq: Three times a day (TID) | ORAL | Status: DC | PRN

## 2015-08-18 ENCOUNTER — Ambulatory Visit (INDEPENDENT_AMBULATORY_CARE_PROVIDER_SITE_OTHER): Payer: Medicare Other | Admitting: Internal Medicine

## 2015-08-18 ENCOUNTER — Encounter: Payer: Self-pay | Admitting: Internal Medicine

## 2015-08-18 VITALS — BP 146/92 | HR 84 | Temp 98.0°F | Resp 16 | Ht 62.5 in | Wt 154.0 lb

## 2015-08-18 DIAGNOSIS — J101 Influenza due to other identified influenza virus with other respiratory manifestations: Secondary | ICD-10-CM

## 2015-08-18 LAB — POCT INFLUENZA A/B
Influenza A, POC: POSITIVE — AB
Influenza B, POC: NEGATIVE

## 2015-08-18 MED ORDER — BENZONATATE 200 MG PO CAPS: 200.0000 mg | ORAL_CAPSULE | Freq: Three times a day (TID) | ORAL | Status: DC | PRN

## 2015-08-18 MED ORDER — PREDNISONE 20 MG PO TABS: ORAL_TABLET | ORAL | Status: DC

## 2015-08-18 MED ORDER — AZITHROMYCIN 250 MG PO TABS: ORAL_TABLET | ORAL | Status: DC

## 2015-08-18 MED ORDER — PHENYLEPH-PROMETHAZINE-COD 5-6.25-10 MG/5ML PO SYRP: ORAL_SOLUTION | ORAL | Status: DC

## 2015-08-18 NOTE — Patient Instructions (Signed)
Please take the prednisone daily until it is gone.  Please take the zpak until it is gone.  Please take cough syrup as needed.  Please take tessalon if you don't want to be sedated.  Alternate Tylenol and ibuprofen as needed for body aches and fever.  Please drink plenty of fluids and eat as much as you can tolerate.  Rinse nose with saline to help with congestion

## 2015-08-18 NOTE — Progress Notes (Signed)
Patient ID: Rebekah Hamilton, female   DOB: 04-Nov-1945, 70 y.o.   MRN: [id number]  HPI  Patient presents to the office for evaluation of cough.  It has been going on for 3 days.  Patient reports night > day, dry, worse with lying down.  They also endorse change in voice, chills, postnasal drip and nasal congestion, clear rhinorrhea, headache, body aches, nasuea.  .  They have tried dayquil and nyquil.  They report that nothing has worked.  They admits to other sick contacts. She reports that her Husband has very similar symptoms. She has been able to eat and drink okay.  Has taken dayquil this morning at 6:30.  Review of Systems  Constitutional: Positive for chills and malaise/fatigue. Negative for fever.  HENT: Positive for congestion and sore throat. Negative for ear pain.   Respiratory: Positive for cough. Negative for hemoptysis, sputum production, shortness of breath and wheezing.   Cardiovascular: Negative for chest pain, palpitations and leg swelling.  Musculoskeletal: Positive for myalgias. Negative for neck pain.  Neurological: Positive for weakness and headaches. Negative for dizziness and loss of consciousness.    PE:  Filed Vitals:   08/18/15 1031  BP: 146/92  Pulse: 84  Temp: 98 F (36.7 C)  Resp: 16    General:  Alert and non-toxic, WDWN, NAD HEENT: NCAT, PERLA, EOM normal, no occular discharge or erythema.  Nasal mucosal edema with sinus tenderness to palpation.  Oropharynx clear with minimal oropharyngeal edema and erythema.  Mucous membranes moist and pink. Neck:  Cervical adenopathy Chest:  RRR no MRGs.  Lungs clear to auscultation A&P with no wheezes rhonchi or rales.   Abdomen: +BS x 4 quadrants, soft, non-tender, no guarding, rigidity, or rebound. Skin: warm and dry no rash Neuro: A&Ox4, CN II-XII grossly intact  Assessment and Plan:   1. Influenza A -positive rapid flu -history consistent with flu -prednisone -nasal saline -zpak to cover for possible  concomittent lung infection -phenergan codeine cough syrup -tessalon - POCT Influenza A/B

## 2015-08-25 ENCOUNTER — Telehealth: Payer: Self-pay | Admitting: *Deleted

## 2015-08-25 NOTE — Telephone Encounter (Signed)
Patient called with concerns about flu symptoms still present.  Finished abx and prednisone but c/o extreme weakness and slight cough.  Per Starlyn Skeans, PA-C patient was advised that she will need to get plenty of rest and fluids to help combat symptoms.  Advised her to f/u for ov if no relief from symptoms or if symptoms increase.  Patient expressed understanding.

## 2015-09-16 ENCOUNTER — Ambulatory Visit (INDEPENDENT_AMBULATORY_CARE_PROVIDER_SITE_OTHER): Payer: Medicare Other | Admitting: Physician Assistant

## 2015-09-16 ENCOUNTER — Encounter: Payer: Self-pay | Admitting: Physician Assistant

## 2015-09-16 VITALS — BP 110/74 | HR 66 | Temp 97.3°F | Resp 14 | Ht 62.5 in | Wt 157.0 lb

## 2015-09-16 DIAGNOSIS — E559 Vitamin D deficiency, unspecified: Secondary | ICD-10-CM | POA: Diagnosis not present

## 2015-09-16 DIAGNOSIS — E785 Hyperlipidemia, unspecified: Secondary | ICD-10-CM | POA: Diagnosis not present

## 2015-09-16 DIAGNOSIS — I10 Essential (primary) hypertension: Secondary | ICD-10-CM | POA: Diagnosis not present

## 2015-09-16 DIAGNOSIS — R7309 Other abnormal glucose: Secondary | ICD-10-CM

## 2015-09-16 DIAGNOSIS — Z79899 Other long term (current) drug therapy: Secondary | ICD-10-CM

## 2015-09-16 LAB — CBC WITH DIFFERENTIAL/PLATELET
BASOS ABS: 0 10*3/uL (ref 0.0–0.1)
BASOS PCT: 0 % (ref 0–1)
EOS PCT: 2 % (ref 0–5)
Eosinophils Absolute: 0.2 10*3/uL (ref 0.0–0.7)
HEMATOCRIT: 43.8 % (ref 36.0–46.0)
Hemoglobin: 14.5 g/dL (ref 12.0–15.0)
LYMPHS PCT: 29 % (ref 12–46)
Lymphs Abs: 2.4 10*3/uL (ref 0.7–4.0)
MCH: 29.8 pg (ref 26.0–34.0)
MCHC: 33.1 g/dL (ref 30.0–36.0)
MCV: 90.1 fL (ref 78.0–100.0)
MONO ABS: 0.7 10*3/uL (ref 0.1–1.0)
MPV: 9.8 fL (ref 8.6–12.4)
Monocytes Relative: 8 % (ref 3–12)
Neutro Abs: 5 10*3/uL (ref 1.7–7.7)
Neutrophils Relative %: 61 % (ref 43–77)
Platelets: 305 10*3/uL (ref 150–400)
RBC: 4.86 MIL/uL (ref 3.87–5.11)
RDW: 13.9 % (ref 11.5–15.5)
WBC: 8.2 10*3/uL (ref 4.0–10.5)

## 2015-09-16 LAB — HEMOGLOBIN A1C
HEMOGLOBIN A1C: 5.9 % — AB (ref <5.7)
MEAN PLASMA GLUCOSE: 123 mg/dL — AB (ref <117)

## 2015-09-16 LAB — LIPID PANEL
CHOLESTEROL: 158 mg/dL (ref 125–200)
HDL: 48 mg/dL (ref >=46)
LDL CALC: 88 mg/dL (ref <130)
TRIGLYCERIDES: 111 mg/dL (ref <150)
Total CHOL/HDL Ratio: 3.3 Ratio (ref <=5.0)
VLDL: 22 mg/dL (ref <30)

## 2015-09-16 LAB — HEPATIC FUNCTION PANEL
ALBUMIN: 4 g/dL (ref 3.6–5.1)
ALK PHOS: 40 U/L (ref 33–130)
ALT: 17 U/L (ref 6–29)
AST: 21 U/L (ref 10–35)
BILIRUBIN DIRECT: 0.1 mg/dL (ref <=0.2)
BILIRUBIN INDIRECT: 0.3 mg/dL (ref 0.2–1.2)
BILIRUBIN TOTAL: 0.4 mg/dL (ref 0.2–1.2)
TOTAL PROTEIN: 6.2 g/dL (ref 6.1–8.1)

## 2015-09-16 LAB — BASIC METABOLIC PANEL WITH GFR
BUN: 10 mg/dL (ref 7–25)
CALCIUM: 8.9 mg/dL (ref 8.6–10.4)
CO2: 23 mmol/L (ref 20–31)
Chloride: 109 mmol/L (ref 98–110)
Creat: 0.81 mg/dL (ref 0.50–0.99)
GFR, EST AFRICAN AMERICAN: 86 mL/min (ref >=60)
GFR, EST NON AFRICAN AMERICAN: 75 mL/min (ref >=60)
Glucose, Bld: 94 mg/dL (ref 65–99)
POTASSIUM: 4.7 mmol/L (ref 3.5–5.3)
SODIUM: 142 mmol/L (ref 135–146)

## 2015-09-16 LAB — TSH: TSH: 1.573 u[IU]/mL (ref 0.350–4.500)

## 2015-09-16 LAB — MAGNESIUM: MAGNESIUM: 2.1 mg/dL (ref 1.5–2.5)

## 2015-09-16 NOTE — Patient Instructions (Signed)
Can put over the counter hydrocortisone on your legs PRN.   HOME CARE INSTRUCTIONS   Do not stand or sit in one position for long periods of time. Do not sit with your legs crossed. Rest with your legs raised during the day.  Your legs have to be higher than your heart so that gravity will force the valves to open, so please really elevate your legs.   Wear elastic stockings or support hose. Do not wear other tight, encircling garments around the legs, pelvis, or waist.  [employer]  has a wide variety of well priced compression stockings. [place], [place] [id number] [phone]  Walk as much as possible to increase blood flow.  Raise the foot of your bed at night with 2-inch blocks. SEEK MEDICAL CARE IF:   The skin around your ankle starts to break down.  You have pain, redness, tenderness, or hard swelling developing in your leg over a vein.  You are uncomfortable due to leg pain. Document Released: 05/11/2005 Document Revised: 10/24/2011 Document Reviewed: 09/27/2010 [facility] Patient Information 2014 [place].

## 2015-09-16 NOTE — Progress Notes (Signed)
Assessment and Plan:  Hypertension: Continue medication, monitor blood pressure at home. Continue DASH diet. Cholesterol: Continue diet and exercise. Check cholesterol.  Pre-diabetes-Continue diet and exercise. Check A1C Vitamin D Def- check level and continue medications.  Obesity with co morbidities- long discussion about weight loss, diet, and exercise Depression/fatigue- suggest getting back on wellbutrin  Continue diet and meds as discussed. Further disposition pending results of labs.  HPI 70 y.o. female  presents for 3 month follow up with hypertension, hyperlipidemia, prediabetes and vitamin D. Her blood pressure has been controlled at home, today their BP is BP: 110/74 mmHg She does not workout due to spinal stenosis and being busy. She denies chest pain, shortness of breath, dizziness.  She is still having some cough and fatigue from recent + influenza A, however while sick she did abruptly stop her zoloft 25mg  and wellbutrin 300mg .  She is on cholesterol medication and denies myalgias. Her cholesterol is at goal. The cholesterol last visit was:   Lab Results  Component Value Date   CHOL 147 06/02/2015   HDL 51 06/02/2015   LDLCALC 75 06/02/2015   TRIG 106 06/02/2015   CHOLHDL 2.9 06/02/2015   She has been working on diet and exercise for prediabetes, and denies paresthesia of the feet, polydipsia and polyuria. Last A1C in the office was:  Lab Results  Component Value Date   HGBA1C 5.7* 06/02/2015   Patient is on Vitamin D supplement.   Lab Results  Component Value Date   VD25OH 24* 02/03/2015   BMI is Body mass index is 28.24 kg/(m^2)., she is working on diet and exercise. Wt Readings from Last 3 Encounters:  09/16/15 157 lb (71.215 kg)  08/18/15 154 lb (69.854 kg)  06/02/15 152 lb (68.947 kg)    Current Medications:  Current Outpatient Prescriptions on File Prior to Visit  Medication Sig Dispense Refill  . ALPRAZolam (XANAX) 0.25 MG tablet Take 1 tablet (0.25  mg total) by mouth 3 (three) times daily as needed. 90 tablet 5  . Ascorbic Acid (VITAMIN C PO) 500 mg 2 (two) times daily.    Marland Kitchen aspirin 325 MG tablet Take 325 mg by mouth daily.    . benzonatate (TESSALON) 200 MG capsule Take 1 capsule (200 mg total) by mouth 3 (three) times daily as needed for cough. 30 capsule 1  . Cholecalciferol (VITAMIN D PO) Take by mouth. Takes 10000 to 12000 units daily.    Marland Kitchen estradiol (ESTRACE) 0.5 MG tablet Take 1 tablet (0.5 mg total) by mouth daily. 90 tablet 3  . fexofenadine (ALLEGRA) 180 MG tablet Take 180 mg by mouth daily.    . pravastatin (PRAVACHOL) 40 MG tablet Take 1 tablet (40 mg total) by mouth daily. 90 tablet 0  . progesterone (PROMETRIUM) 100 MG capsule Take 1 capsule (100 mg total) by mouth daily. 90 capsule 3  . rizatriptan (MAXALT) 10 MG tablet Take 1 tablet (10 mg total) by mouth as needed for migraine. May repeat in 2 hours if needed 12 tablet 99  . traZODone (DESYREL) 150 MG tablet TAKE 1/3-1/2-1 TABLET BY MOUTH 1 HOUR PRIOR TO BEDTIME AS NEEDED FOR SLEEP 30 tablet 1  . verapamil (CALAN) 80 MG tablet TAKE 1 TABLET BY MOUTH THREE TIMES DAILY 270 tablet 0  . buPROPion (WELLBUTRIN XL) 300 MG 24 hr tablet Take 1 tablet every morning for mood & concentration 90 tablet 1  . Phenyleph-Promethazine-Cod 5-6.25-10 MG/5ML SYRP Take 5-10 mL PO q8hrs as needed for severe coughing (Patient  not taking: Reported on 09/16/2015) 240 mL 0  . sertraline (ZOLOFT) 100 MG tablet Take 1/4 to 1/2 to 1 tablet at bedtime as directed (Patient not taking: Reported on 09/16/2015) 90 tablet 3   No current facility-administered medications on file prior to visit.   Medical History:  Past Medical History  Diagnosis Date  . Hyperlipidemia   . Hypertension   . Prediabetes   . Migraines   . Allergy   . Anemia   . Anxiety   . Depression   . Lumbar stenosis    Allergies:  Allergies  Allergen Reactions  . Augmentin [Amoxicillin-Pot Clavulanate] Diarrhea  . Prozac [Fluoxetine  Hcl] Other (See Comments)    Disoriented  . Wellbutrin [Bupropion] Other (See Comments)    Insomnia/Nervousness    Review of Systems  Constitutional: Negative for fever, chills and diaphoresis.  HENT: Negative for congestion, ear discharge, ear pain, hearing loss, nosebleeds, sore throat and tinnitus.   Eyes: Negative.   Respiratory: Negative.  Negative for shortness of breath and stridor.   Cardiovascular: Negative for chest pain, palpitations, orthopnea, claudication, leg swelling and PND.  Gastrointestinal: Negative.  Negative for diarrhea and constipation.  Genitourinary: Negative.   Musculoskeletal: Negative for myalgias, back pain, joint pain, falls and neck pain.  Skin: Negative.   Neurological: Negative for dizziness, tingling, tremors, sensory change, speech change, focal weakness, seizures, loss of consciousness, weakness and headaches.  Endo/Heme/Allergies: Negative.   Psychiatric/Behavioral: Positive for depression. Negative for suicidal ideas, hallucinations, memory loss and substance abuse. The patient has insomnia. The patient is not nervous/anxious.     Family history- Review and unchanged Social history- Review and unchanged Physical Exam: BP 110/74 mmHg  Pulse 66  Temp(Src) 97.3 F (36.3 C)  Resp 14  Ht 5' 2.5" (1.588 m)  Wt 157 lb (71.215 kg)  BMI 28.24 kg/m2  SpO2 98% Wt Readings from Last 3 Encounters:  09/16/15 157 lb (71.215 kg)  08/18/15 154 lb (69.854 kg)  06/02/15 152 lb (68.947 kg)  Body mass index is 28.24 kg/(m^2).  General Appearance: Well nourished, in no apparent distress. Eyes: PERRLA, EOMs, conjunctiva no swelling or erythema Sinuses: No Frontal/maxillary tenderness ENT/Mouth: Ext aud canals clear, TMs without erythema, bulging. No erythema, swelling, or exudate on post pharynx.  Tonsils not swollen or erythematous. Hearing normal.  Neck: Supple, thyroid normal.  Respiratory: Respiratory effort normal, BS equal bilaterally without rales,  rhonchi, wheezing or stridor.  Cardio: RRR with no MRGs. Brisk peripheral pulses without edema.  Abdomen: Soft, + BS.  Non tender, no guarding, rebound, hernias, masses. Lymphatics: Non tender without lymphadenopathy.  Musculoskeletal: Full ROM, 5/5 strength, normal gait.  Skin: Warm, dry without rashes, lesions, ecchymosis.  Neuro: Cranial nerves intact. Normal muscle tone, no cerebellar symptoms. Sensation intact.  Psych: Awake and oriented X 3, normal affect, Insight and Judgment appropriate.    Vicie Mutters 9:14 AM

## 2015-10-03 ENCOUNTER — Other Ambulatory Visit: Payer: Self-pay | Admitting: Physician Assistant

## 2015-10-05 ENCOUNTER — Other Ambulatory Visit: Payer: Self-pay

## 2015-10-05 DIAGNOSIS — Z1231 Encounter for screening mammogram for malignant neoplasm of breast: Secondary | ICD-10-CM

## 2015-10-15 ENCOUNTER — Ambulatory Visit
Admission: RE | Admit: 2015-10-15 | Discharge: 2015-10-15 | Disposition: A | Discharge status: Home / Self Care | Payer: Medicare Other | Source: Ambulatory Visit

## 2015-10-15 DIAGNOSIS — Z1231 Encounter for screening mammogram for malignant neoplasm of breast: Secondary | ICD-10-CM | POA: Diagnosis not present

## 2015-12-16 ENCOUNTER — Ambulatory Visit: Payer: Self-pay | Admitting: Internal Medicine

## 2015-12-16 ENCOUNTER — Encounter: Payer: Self-pay | Admitting: Physician Assistant

## 2015-12-16 ENCOUNTER — Ambulatory Visit (INDEPENDENT_AMBULATORY_CARE_PROVIDER_SITE_OTHER): Payer: Medicare Other | Admitting: Physician Assistant

## 2015-12-16 VITALS — BP 130/72 | HR 74 | Temp 97.3°F | Resp 16 | Ht 62.5 in | Wt 157.4 lb

## 2015-12-16 DIAGNOSIS — R05 Cough: Secondary | ICD-10-CM | POA: Diagnosis not present

## 2015-12-16 DIAGNOSIS — Z79899 Other long term (current) drug therapy: Secondary | ICD-10-CM | POA: Diagnosis not present

## 2015-12-16 DIAGNOSIS — D649 Anemia, unspecified: Secondary | ICD-10-CM

## 2015-12-16 DIAGNOSIS — I10 Essential (primary) hypertension: Secondary | ICD-10-CM

## 2015-12-16 DIAGNOSIS — R059 Cough, unspecified: Secondary | ICD-10-CM

## 2015-12-16 DIAGNOSIS — E559 Vitamin D deficiency, unspecified: Secondary | ICD-10-CM | POA: Diagnosis not present

## 2015-12-16 DIAGNOSIS — R7309 Other abnormal glucose: Secondary | ICD-10-CM | POA: Diagnosis not present

## 2015-12-16 DIAGNOSIS — E785 Hyperlipidemia, unspecified: Secondary | ICD-10-CM | POA: Diagnosis not present

## 2015-12-16 LAB — LIPID PANEL
CHOLESTEROL: 131 mg/dL (ref 125–200)
HDL: 51 mg/dL (ref >=46)
LDL CALC: 57 mg/dL (ref <130)
TRIGLYCERIDES: 116 mg/dL (ref <150)
Total CHOL/HDL Ratio: 2.6 Ratio (ref <=5.0)
VLDL: 23 mg/dL (ref <30)

## 2015-12-16 LAB — HEPATIC FUNCTION PANEL
ALBUMIN: 4.2 g/dL (ref 3.6–5.1)
ALK PHOS: 49 U/L (ref 33–130)
ALT: 19 U/L (ref 6–29)
AST: 19 U/L (ref 10–35)
BILIRUBIN TOTAL: 0.3 mg/dL (ref 0.2–1.2)
Bilirubin, Direct: 0.1 mg/dL (ref <=0.2)
Indirect Bilirubin: 0.2 mg/dL (ref 0.2–1.2)
TOTAL PROTEIN: 6.5 g/dL (ref 6.1–8.1)

## 2015-12-16 LAB — CBC WITH DIFFERENTIAL/PLATELET
BASOS ABS: 0 {cells}/uL (ref 0–200)
BASOS PCT: 0 %
EOS ABS: 103 {cells}/uL (ref 15–500)
Eosinophils Relative: 1 %
HEMATOCRIT: 42.7 % (ref 35.0–45.0)
Hemoglobin: 14.1 g/dL (ref 11.7–15.5)
LYMPHS ABS: 2781 {cells}/uL (ref 850–3900)
Lymphocytes Relative: 27 %
MCH: 30.1 pg (ref 27.0–33.0)
MCHC: 33 g/dL (ref 32.0–36.0)
MCV: 91.2 fL (ref 80.0–100.0)
MONO ABS: 1133 {cells}/uL — AB (ref 200–950)
MPV: 9.8 fL (ref 7.5–12.5)
Monocytes Relative: 11 %
NEUTROS PCT: 61 %
Neutro Abs: 6283 cells/uL (ref 1500–7800)
PLATELETS: 318 10*3/uL (ref 140–400)
RBC: 4.68 MIL/uL (ref 3.80–5.10)
RDW: 13.4 % (ref 11.0–15.0)
WBC: 10.3 10*3/uL (ref 3.8–10.8)

## 2015-12-16 LAB — BASIC METABOLIC PANEL WITH GFR
BUN: 16 mg/dL (ref 7–25)
CALCIUM: 9.3 mg/dL (ref 8.6–10.4)
CO2: 29 mmol/L (ref 20–31)
CREATININE: 0.95 mg/dL (ref 0.50–0.99)
Chloride: 106 mmol/L (ref 98–110)
GFR, EST AFRICAN AMERICAN: 71 mL/min (ref >=60)
GFR, Est Non African American: 62 mL/min (ref >=60)
GLUCOSE: 104 mg/dL — AB (ref 65–99)
Potassium: 3.9 mmol/L (ref 3.5–5.3)
Sodium: 145 mmol/L (ref 135–146)

## 2015-12-16 LAB — HEMOGLOBIN A1C
HEMOGLOBIN A1C: 5.5 % (ref <5.7)
MEAN PLASMA GLUCOSE: 111 mg/dL

## 2015-12-16 LAB — MAGNESIUM: Magnesium: 2.2 mg/dL (ref 1.5–2.5)

## 2015-12-16 LAB — TSH: TSH: 3.09 m[IU]/L

## 2015-12-16 MED ORDER — TRAZODONE HCL 150 MG PO TABS: ORAL_TABLET | ORAL | Status: DC

## 2015-12-16 MED ORDER — AZITHROMYCIN 250 MG PO TABS: ORAL_TABLET | ORAL | Status: AC

## 2015-12-16 NOTE — Progress Notes (Signed)
Assessment and Plan:  Hypertension: Continue medication, monitor blood pressure at home. Continue DASH diet. Cholesterol: Continue diet and exercise. Check cholesterol.  Pre-diabetes-Continue diet and exercise. Check A1C Vitamin D Def- check level and continue medications.  Obesity with co morbidities- long discussion about weight loss, diet, and exercise Depression/fatigue- continue medications Cough on and off x months- get CXR, zpak, continue allegra, flonase  Continue diet and meds as discussed. Further disposition pending results of labs.  HPI 70 y.o. female  presents for 3 month follow up with hypertension, hyperlipidemia, prediabetes and vitamin D. Her blood pressure has been controlled at home, today their BP is BP: 130/72 mmHg She does not workout due to spinal stenosis and being busy. She denies chest pain, shortness of breath, dizziness.  She is back on wellbutrin in the AM and 1/2 zoloft at night, and she is on trazodone.  Has been having cough, congestion, laryngitis, she has runny nose X last Sunday, on allegra.  She is on cholesterol medication and denies myalgias. Her cholesterol is at goal. The cholesterol last visit was:   Lab Results  Component Value Date   CHOL 158 09/16/2015   HDL 48 09/16/2015   LDLCALC 88 09/16/2015   TRIG 111 09/16/2015   CHOLHDL 3.3 09/16/2015   She has been working on diet and exercise for prediabetes, and denies paresthesia of the feet, polydipsia and polyuria. Last A1C in the office was:  Lab Results  Component Value Date   HGBA1C 5.9* 09/16/2015   Patient is on Vitamin D supplement.   Lab Results  Component Value Date   VD25OH 24* 02/03/2015   BMI is Body mass index is 28.31 kg/(m^2)., she is working on diet and exercise. Wt Readings from Last 3 Encounters:  12/16/15 157 lb 6.4 oz (71.396 kg)  09/16/15 157 lb (71.215 kg)  08/18/15 154 lb (69.854 kg)    Current Medications:  Current Outpatient Prescriptions on File Prior to  Visit  Medication Sig Dispense Refill  . ALPRAZolam (XANAX) 0.25 MG tablet Take 1 tablet (0.25 mg total) by mouth 3 (three) times daily as needed. 90 tablet 5  . Ascorbic Acid (VITAMIN C PO) 500 mg 2 (two) times daily.    Marland Kitchen aspirin 325 MG tablet Take 325 mg by mouth daily.    . benzonatate (TESSALON) 200 MG capsule Take 1 capsule (200 mg total) by mouth 3 (three) times daily as needed for cough. 30 capsule 1  . Cholecalciferol (VITAMIN D PO) Take by mouth. Takes 10000 to 12000 units daily.    Marland Kitchen estradiol (ESTRACE) 0.5 MG tablet Take 1 tablet (0.5 mg total) by mouth daily. 90 tablet 3  . fexofenadine (ALLEGRA) 180 MG tablet Take 180 mg by mouth daily.    . pravastatin (PRAVACHOL) 40 MG tablet TAKE 1 TABLET BY MOUTH DAILY 90 tablet 0  . progesterone (PROMETRIUM) 100 MG capsule Take 1 capsule (100 mg total) by mouth daily. 90 capsule 3  . rizatriptan (MAXALT) 10 MG tablet Take 1 tablet (10 mg total) by mouth as needed for migraine. May repeat in 2 hours if needed 12 tablet 99  . sertraline (ZOLOFT) 100 MG tablet Take 1/4 to 1/2 to 1 tablet at bedtime as directed 90 tablet 3  . traZODone (DESYREL) 150 MG tablet TAKE 1/3-1/2-1 TABLET BY MOUTH 1 HOUR PRIOR TO BEDTIME AS NEEDED FOR SLEEP 30 tablet 1  . verapamil (CALAN) 80 MG tablet TAKE 1 TABLET BY MOUTH THREE TIMES DAILY 270 tablet 0  .  buPROPion (WELLBUTRIN XL) 300 MG 24 hr tablet Take 1 tablet every morning for mood & concentration 90 tablet 1   No current facility-administered medications on file prior to visit.   Medical History:  Past Medical History  Diagnosis Date  . Hyperlipidemia   . Hypertension   . Prediabetes   . Migraines   . Allergy   . Anemia   . Anxiety   . Depression   . Lumbar stenosis    Allergies:  Allergies  Allergen Reactions  . Augmentin [Amoxicillin-Pot Clavulanate] Diarrhea  . Prozac [Fluoxetine Hcl] Other (See Comments)    Disoriented  . Wellbutrin [Bupropion] Other (See Comments)    Insomnia/Nervousness     Review of Systems  Constitutional: Negative for fever, chills and diaphoresis.  HENT: Positive for sore throat. Negative for congestion, ear discharge, ear pain, hearing loss, nosebleeds and tinnitus.   Eyes: Negative.   Respiratory: Positive for cough. Negative for shortness of breath and stridor.   Cardiovascular: Negative for chest pain, palpitations, orthopnea, claudication, leg swelling and PND.  Gastrointestinal: Negative.  Negative for diarrhea and constipation.  Genitourinary: Negative.   Musculoskeletal: Negative for myalgias, back pain, joint pain, falls and neck pain.  Skin: Negative.   Neurological: Positive for headaches. Negative for dizziness, tingling, tremors, sensory change, speech change, focal weakness, seizures, loss of consciousness and weakness.  Endo/Heme/Allergies: Negative.   Psychiatric/Behavioral: Positive for depression. Negative for suicidal ideas, hallucinations, memory loss and substance abuse. The patient has insomnia. The patient is not nervous/anxious.     Family history- Review and unchanged Social history- Review and unchanged Physical Exam: BP 130/72 mmHg  Pulse 74  Temp(Src) 97.3 F (36.3 C) (Temporal)  Resp 16  Ht 5' 2.5" (1.588 m)  Wt 157 lb 6.4 oz (71.396 kg)  BMI 28.31 kg/m2  SpO2 95% Wt Readings from Last 3 Encounters:  12/16/15 157 lb 6.4 oz (71.396 kg)  09/16/15 157 lb (71.215 kg)  08/18/15 154 lb (69.854 kg)  Body mass index is 28.31 kg/(m^2).  General Appearance: Well nourished, in no apparent distress. Eyes: PERRLA, EOMs, conjunctiva no swelling or erythema Sinuses: No Frontal/maxillary tenderness ENT/Mouth: Ext aud canals clear, TMs without erythema, bulging. No erythema, swelling, or exudate on post pharynx.  Tonsils not swollen or erythematous. Hearing normal.  Neck: Supple, thyroid normal.  Respiratory: Respiratory effort normal, BS equal bilaterally without rales, rhonchi, wheezing or stridor.  Cardio: RRR with no MRGs.  Brisk peripheral pulses without edema.  Abdomen: Soft, + BS.  Non tender, no guarding, rebound, hernias, masses. Lymphatics: Non tender without lymphadenopathy.  Musculoskeletal: Full ROM, 5/5 strength, normal gait.  Skin: Warm, dry without rashes, lesions, ecchymosis.  Neuro: Cranial nerves intact. Normal muscle tone, no cerebellar symptoms. Sensation intact.  Psych: Awake and oriented X 3, normal affect, Insight and Judgment appropriate.    Vicie Mutters 2:45 PM

## 2015-12-21 ENCOUNTER — Ambulatory Visit: Payer: Self-pay | Admitting: Internal Medicine

## 2016-01-02 ENCOUNTER — Other Ambulatory Visit: Payer: Self-pay | Admitting: Physician Assistant

## 2016-01-20 ENCOUNTER — Other Ambulatory Visit: Payer: Self-pay | Admitting: Orthopaedic Surgery

## 2016-01-20 DIAGNOSIS — M4806 Spinal stenosis, lumbar region: Secondary | ICD-10-CM | POA: Diagnosis not present

## 2016-01-20 DIAGNOSIS — M48061 Spinal stenosis, lumbar region without neurogenic claudication: Secondary | ICD-10-CM

## 2016-01-20 DIAGNOSIS — M5430 Sciatica, unspecified side: Secondary | ICD-10-CM | POA: Diagnosis not present

## 2016-01-20 DIAGNOSIS — L84 Corns and callosities: Secondary | ICD-10-CM | POA: Diagnosis not present

## 2016-02-01 ENCOUNTER — Ambulatory Visit
Admission: RE | Admit: 2016-02-01 | Discharge: 2016-02-01 | Disposition: A | Discharge status: Home / Self Care | Payer: Medicare Other | Source: Ambulatory Visit | Attending: Orthopaedic Surgery | Admitting: Orthopaedic Surgery

## 2016-02-01 DIAGNOSIS — M48061 Spinal stenosis, lumbar region without neurogenic claudication: Secondary | ICD-10-CM

## 2016-02-01 DIAGNOSIS — M545 Low back pain: Secondary | ICD-10-CM | POA: Diagnosis not present

## 2016-02-01 MED ORDER — IOPAMIDOL (ISOVUE-M 200) INJECTION 41%
1.0000 mL | Freq: Once | INTRAMUSCULAR | Status: AC
  Administered 2016-02-01: 1 mL via EPIDURAL

## 2016-02-01 MED ORDER — METHYLPREDNISOLONE ACETATE 40 MG/ML INJ SUSP (RADIOLOG
120.0000 mg | Freq: Once | INTRAMUSCULAR | Status: AC
  Administered 2016-02-01: 120 mg via EPIDURAL

## 2016-02-01 NOTE — Discharge Instructions (Signed)

## 2016-02-03 ENCOUNTER — Other Ambulatory Visit: Payer: Self-pay | Admitting: Internal Medicine

## 2016-02-21 ENCOUNTER — Other Ambulatory Visit: Payer: Self-pay | Admitting: Internal Medicine

## 2016-03-20 ENCOUNTER — Other Ambulatory Visit: Payer: Self-pay | Admitting: Physician Assistant

## 2016-03-30 ENCOUNTER — Other Ambulatory Visit: Payer: Self-pay

## 2016-03-30 ENCOUNTER — Encounter: Payer: Self-pay | Admitting: Internal Medicine

## 2016-03-30 ENCOUNTER — Ambulatory Visit (INDEPENDENT_AMBULATORY_CARE_PROVIDER_SITE_OTHER): Payer: Medicare Other | Admitting: Internal Medicine

## 2016-03-30 VITALS — BP 126/64 | HR 74 | Temp 97.5°F | Resp 14 | Ht 62.5 in | Wt 153.4 lb

## 2016-03-30 DIAGNOSIS — Z79899 Other long term (current) drug therapy: Secondary | ICD-10-CM

## 2016-03-30 DIAGNOSIS — R7309 Other abnormal glucose: Secondary | ICD-10-CM | POA: Diagnosis not present

## 2016-03-30 DIAGNOSIS — E559 Vitamin D deficiency, unspecified: Secondary | ICD-10-CM

## 2016-03-30 DIAGNOSIS — F411 Generalized anxiety disorder: Secondary | ICD-10-CM

## 2016-03-30 DIAGNOSIS — E785 Hyperlipidemia, unspecified: Secondary | ICD-10-CM | POA: Diagnosis not present

## 2016-03-30 DIAGNOSIS — I10 Essential (primary) hypertension: Secondary | ICD-10-CM | POA: Diagnosis not present

## 2016-03-30 LAB — CBC WITH DIFFERENTIAL/PLATELET
BASOS PCT: 0 %
Basophils Absolute: 0 cells/uL (ref 0–200)
EOS ABS: 192 {cells}/uL (ref 15–500)
Eosinophils Relative: 2 %
HEMATOCRIT: 44.1 % (ref 35.0–45.0)
Hemoglobin: 14.5 g/dL (ref 11.7–15.5)
LYMPHS PCT: 23 %
Lymphs Abs: 2208 cells/uL (ref 850–3900)
MCH: 30.3 pg (ref 27.0–33.0)
MCHC: 32.9 g/dL (ref 32.0–36.0)
MCV: 92.1 fL (ref 80.0–100.0)
MONO ABS: 576 {cells}/uL (ref 200–950)
MONOS PCT: 6 %
MPV: 10 fL (ref 7.5–12.5)
NEUTROS PCT: 69 %
Neutro Abs: 6624 cells/uL (ref 1500–7800)
PLATELETS: 298 10*3/uL (ref 140–400)
RBC: 4.79 MIL/uL (ref 3.80–5.10)
RDW: 13.8 % (ref 11.0–15.0)
WBC: 9.6 10*3/uL (ref 3.8–10.8)

## 2016-03-30 LAB — LIPID PANEL
CHOL/HDL RATIO: 2.4 ratio (ref <=5.0)
Cholesterol: 140 mg/dL (ref 125–200)
HDL: 58 mg/dL (ref >=46)
LDL CALC: 62 mg/dL (ref <130)
Triglycerides: 100 mg/dL (ref <150)
VLDL: 20 mg/dL (ref <30)

## 2016-03-30 LAB — HEPATIC FUNCTION PANEL
ALBUMIN: 4.5 g/dL (ref 3.6–5.1)
ALK PHOS: 45 U/L (ref 33–130)
ALT: 16 U/L (ref 6–29)
AST: 20 U/L (ref 10–35)
BILIRUBIN TOTAL: 0.4 mg/dL (ref 0.2–1.2)
Bilirubin, Direct: 0.1 mg/dL (ref <=0.2)
Indirect Bilirubin: 0.3 mg/dL (ref 0.2–1.2)
Total Protein: 6.6 g/dL (ref 6.1–8.1)

## 2016-03-30 LAB — BASIC METABOLIC PANEL WITH GFR
BUN: 12 mg/dL (ref 7–25)
CHLORIDE: 106 mmol/L (ref 98–110)
CO2: 25 mmol/L (ref 20–31)
CREATININE: 0.93 mg/dL (ref 0.50–0.99)
Calcium: 9.6 mg/dL (ref 8.6–10.4)
GFR, Est African American: 73 mL/min (ref >=60)
GFR, Est Non African American: 63 mL/min (ref >=60)
Glucose, Bld: 106 mg/dL — ABNORMAL HIGH (ref 65–99)
POTASSIUM: 4.2 mmol/L (ref 3.5–5.3)
Sodium: 142 mmol/L (ref 135–146)

## 2016-03-30 LAB — TSH: TSH: 2.07 m[IU]/L

## 2016-03-30 LAB — MAGNESIUM: MAGNESIUM: 2 mg/dL (ref 1.5–2.5)

## 2016-03-30 MED ORDER — BUPROPION HCL ER (XL) 300 MG PO TB24: ORAL_TABLET | ORAL | 1 refills | Status: DC

## 2016-03-30 MED ORDER — MELOXICAM 15 MG PO TABS: ORAL_TABLET | ORAL | 1 refills | Status: DC
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
## 2016-03-30 NOTE — Patient Instructions (Signed)

## 2016-03-31 LAB — HEMOGLOBIN A1C
HEMOGLOBIN A1C: 5.2 % (ref <5.7)
Mean Plasma Glucose: 103 mg/dL

## 2016-03-31 LAB — VITAMIN D 25 HYDROXY (VIT D DEFICIENCY, FRACTURES): Vit D, 25-Hydroxy: 45 ng/mL (ref 30–100)

## 2016-03-31 LAB — INSULIN, RANDOM: INSULIN: 58.8 u[IU]/mL — AB (ref 2.0–19.6)

## 2016-04-03 ENCOUNTER — Other Ambulatory Visit: Payer: Self-pay | Admitting: Internal Medicine

## 2016-05-19 ENCOUNTER — Other Ambulatory Visit: Payer: Self-pay | Admitting: Physician Assistant

## 2016-05-23 ENCOUNTER — Encounter: Payer: Self-pay | Admitting: Internal Medicine

## 2016-05-23 ENCOUNTER — Ambulatory Visit (INDEPENDENT_AMBULATORY_CARE_PROVIDER_SITE_OTHER): Payer: Medicare Other | Admitting: Internal Medicine

## 2016-05-23 VITALS — BP 128/70 | HR 68 | Temp 98.0°F | Resp 16 | Ht 62.5 in | Wt 150.0 lb

## 2016-05-23 DIAGNOSIS — F411 Generalized anxiety disorder: Secondary | ICD-10-CM | POA: Diagnosis not present

## 2016-05-23 DIAGNOSIS — M25522 Pain in left elbow: Secondary | ICD-10-CM

## 2016-05-23 MED ORDER — PREDNISONE 20 MG PO TABS: ORAL_TABLET | ORAL | 0 refills | Status: DC

## 2016-05-23 NOTE — Patient Instructions (Addendum)
Please take the prednisone daily until it is gone.  Once you finish the prednisone please start taking mobic(meloxicam) daily with food in the evenings.  Take this for 2 weeks to 1 month.     Please use heat on your elbow either a heating pad or warm water soaks.  If you have no relief let us know and we will get you to go and see the orthopedists.     Tennis Elbow Tennis elbow (lateral epicondylitis) is inflammation of the outer tendons of your forearm close to your elbow. Your tendons attach your muscles to your bones. The outer tendons of your forearm are used to extend your wrist, and they attach on the outside part of your elbow. Tennis elbow is often found in people who play tennis, but anyone may get the condition from repeatedly extending the wrist or turning the forearm. CAUSES This condition is caused by repeatedly extending your wrist and using your hands. It can result from sports or work that requires repetitive forearm movements. Tennis elbow may also be caused by an injury. RISK FACTORS You have a higher risk of developing tennis elbow if you play tennis or another racquet sport. You also have a higher risk if you frequently use your hands for work. This condition is also more likely to develop in:  Musicians.  Carpenters, painters, and plumbers.  Cooks.  Cashiers.  People who work in [employer].  Architect workers.  Butchers.  People who use computers. SYMPTOMS Symptoms of this condition include:  Pain and tenderness in your forearm and the outer part of your elbow. You may only feel the pain when you use your arm, or you may feel it even when you are not using your arm.  A burning feeling that runs from your elbow through your arm.  Weak grip in your hands. DIAGNOSIS  This condition may be diagnosed by medical history and physical exam. You may also have other tests, including:  X-rays.  MRI. TREATMENT Your health care provider will recommend lifestyle  adjustments, such as resting and icing your arm. Treatment may also include:  Medicines for inflammation. This may include shots of cortisone if your pain continues.  Physical therapy. This may include massage or exercises.  An elbow brace. Surgery may eventually be recommended if your pain does not go away with treatment. HOME CARE INSTRUCTIONS Activity  Rest your elbow and wrist as directed by your health care provider. Try to avoid any activities that caused the problem until your health care provider says that you can do them again.  If a physical therapist teaches you exercises, do all of them as directed.  If you lift an object, lift it with your palm facing upward. This lowers the stress on your elbow. Lifestyle  If your tennis elbow is caused by sports, check your equipment and make sure that:  You are using it correctly.  It is the best fit for you.  If your tennis elbow is caused by work, take breaks frequently, if you are able. Talk with your manager about how to best perform tasks in a way that is safe.  If your tennis elbow is caused by computer use, talk with your manager about any changes that can be made to your work environment. General Instructions  If directed, apply ice to the painful area:  Put ice in a plastic bag.  Place a towel between your skin and the bag.  Leave the ice on for 20 minutes, 2-3 times  per day.  Take medicines only as directed by your health care provider.  If you were given a brace, wear it as directed by your health care provider.  Keep all follow-up visits as directed by your health care provider. This is important. SEEK MEDICAL CARE IF:  Your pain does not get better with treatment.  Your pain gets worse.  You have numbness or weakness in your forearm, hand, or fingers.   This information is not intended to replace advice given to you by your health care provider. Make sure you discuss any questions you have with your  health care provider.   Document Released: 08/01/2005 Document Revised: 12/16/2014 Document Reviewed: 07/28/2014 Elsevier Interactive Patient Education [employer].

## 2016-05-23 NOTE — Progress Notes (Signed)
   Subjective:    Patient ID: Rebekah Hamilton, female    DOB: 10-08-1945, 70 y.o.   MRN: [id number]  HPI  Patient presents to the office for evaluation of both elbow pain and also some depression and anxiety.  She reports that she has been taking wellbutrin and also has been taking a 1/2 tablet of zoloft.  She reports that she is currently seeing a Social worker.  SHe is not sure what she wants to do about her medication.  She has been having left elbow pain for the past 2 weeks.  She reports that she was reaching down to pick something up and she developed a strong ache.  She reports that she now gets aching pain with full extension and full flexion of the elbow.  She reports that she pain is a nagging 3-4/10.  She does have pain when she turns door handles or opens jars.  She reports that it does occasionally radiate.  No tingling or numbness with it.  No swelling or redness.  She is right hand dominant.     Review of Systems  Constitutional: Negative for chills, fatigue and fever.  Respiratory: Negative for chest tightness and shortness of breath.   Cardiovascular: Negative for chest pain and palpitations.  Gastrointestinal: Negative for nausea and vomiting.  Musculoskeletal: Positive for arthralgias. Negative for joint swelling, myalgias and neck pain.  Psychiatric/Behavioral: Positive for decreased concentration, dysphoric mood and sleep disturbance. The patient is nervous/anxious.        Objective:   Physical Exam  Constitutional: She appears well-developed and well-nourished. No distress.  HENT:  Head: Normocephalic.  Mouth/Throat: Oropharynx is clear and moist. No oropharyngeal exudate.  Eyes: Conjunctivae are normal. No scleral icterus.  Neck: Normal range of motion. Neck supple. No JVD present. No thyromegaly present.  Musculoskeletal:       Left elbow: She exhibits decreased range of motion. She exhibits no swelling, no effusion, no deformity and no laceration. Tenderness found.  Medial epicondyle tenderness noted. No radial head, no lateral epicondyle and no olecranon process tenderness noted.  Full active and passive ROM with mild pain.  5/5 strength with pronation, supination,   Lymphadenopathy:    She has no cervical adenopathy.  Skin: She is not diaphoretic.  Nursing note and vitals reviewed.   Vitals:   05/23/16 1140  BP: 128/70  Pulse: 68  Resp: 16  Temp: 98 F (36.7 C)         Assessment & Plan:    1. Left elbow pain -likely tennis elbow vs. Arthritis -prednisone -mobic x 1 month -if no relief patient can get scheduled for ortho visit  2. Generalized anxiety disorder -patient does not want to change current meds -is seeing counselor -if still having issues at the follow-up consider xanax xr and possibly using effexor vs. trintellix

## 2016-06-01 ENCOUNTER — Encounter: Payer: Self-pay | Admitting: Physician Assistant

## 2016-07-04 ENCOUNTER — Ambulatory Visit (INDEPENDENT_AMBULATORY_CARE_PROVIDER_SITE_OTHER): Payer: Medicare Other | Admitting: Physician Assistant

## 2016-07-04 ENCOUNTER — Encounter: Payer: Self-pay | Admitting: Physician Assistant

## 2016-07-04 VITALS — BP 138/72 | HR 76 | Temp 98.0°F | Resp 16 | Ht 62.5 in | Wt 154.0 lb

## 2016-07-04 DIAGNOSIS — I10 Essential (primary) hypertension: Secondary | ICD-10-CM

## 2016-07-04 DIAGNOSIS — Z0000 Encounter for general adult medical examination without abnormal findings: Secondary | ICD-10-CM

## 2016-07-04 DIAGNOSIS — F3340 Major depressive disorder, recurrent, in remission, unspecified: Secondary | ICD-10-CM

## 2016-07-04 DIAGNOSIS — H409 Unspecified glaucoma: Secondary | ICD-10-CM

## 2016-07-04 DIAGNOSIS — M79672 Pain in left foot: Secondary | ICD-10-CM

## 2016-07-04 DIAGNOSIS — F419 Anxiety disorder, unspecified: Secondary | ICD-10-CM

## 2016-07-04 DIAGNOSIS — R7309 Other abnormal glucose: Secondary | ICD-10-CM

## 2016-07-04 DIAGNOSIS — M48061 Spinal stenosis, lumbar region without neurogenic claudication: Secondary | ICD-10-CM

## 2016-07-04 DIAGNOSIS — T7840XD Allergy, unspecified, subsequent encounter: Secondary | ICD-10-CM

## 2016-07-04 DIAGNOSIS — Z23 Encounter for immunization: Secondary | ICD-10-CM

## 2016-07-04 DIAGNOSIS — E785 Hyperlipidemia, unspecified: Secondary | ICD-10-CM | POA: Diagnosis not present

## 2016-07-04 DIAGNOSIS — Z136 Encounter for screening for cardiovascular disorders: Secondary | ICD-10-CM

## 2016-07-04 DIAGNOSIS — M79671 Pain in right foot: Secondary | ICD-10-CM

## 2016-07-04 DIAGNOSIS — Z0001 Encounter for general adult medical examination with abnormal findings: Secondary | ICD-10-CM

## 2016-07-04 DIAGNOSIS — G43909 Migraine, unspecified, not intractable, without status migrainosus: Secondary | ICD-10-CM

## 2016-07-04 DIAGNOSIS — Z6827 Body mass index (BMI) 27.0-27.9, adult: Secondary | ICD-10-CM

## 2016-07-04 DIAGNOSIS — Z79899 Other long term (current) drug therapy: Secondary | ICD-10-CM

## 2016-07-04 DIAGNOSIS — D649 Anemia, unspecified: Secondary | ICD-10-CM | POA: Diagnosis not present

## 2016-07-04 DIAGNOSIS — E559 Vitamin D deficiency, unspecified: Secondary | ICD-10-CM

## 2016-07-04 DIAGNOSIS — R6889 Other general symptoms and signs: Secondary | ICD-10-CM | POA: Diagnosis not present

## 2016-07-04 LAB — LIPID PANEL
CHOL/HDL RATIO: 2.6 ratio (ref <5.0)
CHOLESTEROL: 136 mg/dL (ref <200)
HDL: 52 mg/dL (ref >50)
LDL Cholesterol: 66 mg/dL (ref <100)
Triglycerides: 89 mg/dL (ref <150)
VLDL: 18 mg/dL (ref <30)

## 2016-07-04 LAB — CBC WITH DIFFERENTIAL/PLATELET
BASOS ABS: 0 {cells}/uL (ref 0–200)
Basophils Relative: 0 %
EOS PCT: 2 %
Eosinophils Absolute: 180 cells/uL (ref 15–500)
HCT: 44.7 % (ref 35.0–45.0)
Hemoglobin: 14.4 g/dL (ref 11.7–15.5)
LYMPHS PCT: 27 %
Lymphs Abs: 2430 cells/uL (ref 850–3900)
MCH: 30.2 pg (ref 27.0–33.0)
MCHC: 32.2 g/dL (ref 32.0–36.0)
MCV: 93.7 fL (ref 80.0–100.0)
MONOS PCT: 7 %
MPV: 9.6 fL (ref 7.5–12.5)
Monocytes Absolute: 630 cells/uL (ref 200–950)
NEUTROS ABS: 5760 {cells}/uL (ref 1500–7800)
NEUTROS PCT: 64 %
PLATELETS: 310 10*3/uL (ref 140–400)
RBC: 4.77 MIL/uL (ref 3.80–5.10)
RDW: 12.8 % (ref 11.0–15.0)
WBC: 9 10*3/uL (ref 3.8–10.8)

## 2016-07-04 LAB — BASIC METABOLIC PANEL WITH GFR
BUN: 16 mg/dL (ref 7–25)
CALCIUM: 9.2 mg/dL (ref 8.6–10.4)
CO2: 27 mmol/L (ref 20–31)
CREATININE: 0.9 mg/dL (ref 0.50–0.99)
Chloride: 108 mmol/L (ref 98–110)
GFR, Est African American: 75 mL/min (ref >=60)
GFR, Est Non African American: 65 mL/min (ref >=60)
Glucose, Bld: 81 mg/dL (ref 65–99)
Potassium: 4 mmol/L (ref 3.5–5.3)
SODIUM: 143 mmol/L (ref 135–146)

## 2016-07-04 LAB — HEPATIC FUNCTION PANEL
ALBUMIN: 4.2 g/dL (ref 3.6–5.1)
ALT: 14 U/L (ref 6–29)
AST: 21 U/L (ref 10–35)
Alkaline Phosphatase: 42 U/L (ref 33–130)
BILIRUBIN DIRECT: 0.1 mg/dL (ref <=0.2)
BILIRUBIN TOTAL: 0.4 mg/dL (ref 0.2–1.2)
Indirect Bilirubin: 0.3 mg/dL (ref 0.2–1.2)
Total Protein: 6.3 g/dL (ref 6.1–8.1)

## 2016-07-04 LAB — TSH: TSH: 2.19 m[IU]/L

## 2016-07-04 LAB — MAGNESIUM: Magnesium: 1.9 mg/dL (ref 1.5–2.5)

## 2016-07-04 LAB — IRON AND TIBC
%SAT: 34 % (ref 11–50)
IRON: 103 ug/dL (ref 45–160)
TIBC: 305 ug/dL (ref 250–450)
UIBC: 202 ug/dL (ref 125–400)

## 2016-07-04 LAB — FERRITIN: FERRITIN: 81 ng/mL (ref 20–288)

## 2016-07-04 NOTE — Patient Instructions (Addendum)
Call for colonoscopy Phone: [phone];  Get your eye exam   Your ears and sinuses are connected by the eustachian tube. When your sinuses are inflamed, this can close off the tube and cause fluid to collect in your middle ear. This can then cause dizziness, popping, clicking, ringing, and echoing in your ears. This is often NOT an infection and does NOT require antibiotics, it is caused by inflammation so the treatments help the inflammation. This can take a long time to get better so please be patient.  Here are things you can do to help with this: - Try the Flonase or Nasonex. Remember to spray each nostril twice towards the outer part of your eye.  Do not sniff but instead pinch your nose and tilt your head back to help the medicine get into your sinuses.  The best time to do this is at bedtime.Stop if you get blurred vision or nose bleeds.  -While drinking fluids, pinch and hold nose close and swallow, to help open eustachian tubes to drain fluid behind ear drums. -Please pick one of the over the counter allergy medications below and take it once daily for allergies.  It will also help with fluid behind ear drums. Claritin or loratadine cheapest but likely the weakest  Zyrtec or certizine at night because it can make you sleepy The strongest is allegra or fexafinadine  Cheapest at [employer], [employer], [employer] -can use decongestant over the counter, please do not use if you have high blood pressure or certain heart conditions.   if worsening HA, changes vision/speech, imbalance, weakness go to the ER    Venous Stasis or Chronic Venous Insufficiency Chronic venous insufficiency, also called venous stasis, is a condition that affects the veins in the legs. The condition prevents blood from being pumped through these veins effectively. Blood may no longer be pumped effectively from the legs back to the heart. This condition can range from mild to severe. With proper treatment, you should be able  to continue with an active life. RISK FACTORS Various things can make you more likely to develop chronic venous insufficiency, including:  Family history of this condition.  Obesity.  Pregnancy.  Sedentary lifestyle.  Smoking.  Jobs requiring long periods of standing or sitting in one place.  Being a certain age. Women in their 10s and 23s and men in their 35s are more likely to develop this condition. SIGNS AND SYMPTOMS  Symptoms may include:   Varicose veins.  Skin breakdown or ulcers.  Reddened or discolored skin on the leg.  Brown, smooth, tight, and painful skin just above the ankle, usually on the inside surface (lipodermatosclerosis).  Swelling. DIAGNOSIS  To diagnose this condition, your health care provider will take a medical history and do a physical exam. The following tests may be ordered to confirm the diagnosis:  Duplex ultrasound-A procedure that produces a picture of a blood vessel and nearby organs and also provides information on blood flow through the blood vessel.  Plethysmography-A procedure that tests blood flow.  A venogram, or venography-A procedure used to look at the veins using X-ray and dye. TREATMENT The goals of treatment are to help you return to an active life and to minimize pain or disability. Treatment will depend on the severity of the condition. Medical procedures may be needed for severe cases. Treatment options may include:   Use of compression stockings. These can help with symptoms and lower the chances of the problem getting worse, but they do not  cure the problem.  Sclerotherapy-A procedure involving an injection of a material that "dissolves" the damaged veins. Other veins in the network of blood vessels take over the function of the damaged veins.  Surgery to remove the vein or cut off blood flow through the vein (vein stripping or laser ablation surgery).  Surgery to repair a valve. HOME CARE INSTRUCTIONS   Wear  compression stockings as directed by your health care provider.  Only take over-the-counter or prescription medicines for pain, discomfort, or fever as directed by your health care provider.  Follow up with your health care provider as directed. SEEK MEDICAL CARE IF:   You have redness, swelling, or increasing pain in the affected area.  You see a red streak or line that extends up or down from the affected area.  You have a breakdown or loss of skin in the affected area, even if the breakdown is small.  You have an injury to the affected area. SEEK IMMEDIATE MEDICAL CARE IF:   You have an injury and open wound in the affected area.  Your pain is severe and does not improve with medicine.  You have sudden numbness or weakness in the foot or ankle below the affected area, or you have trouble moving your foot or ankle.  You have a fever or persistent symptoms for more than 2-3 days.  You have a fever and your symptoms suddenly get worse. MAKE SURE YOU:   Understand these instructions.  Will watch your condition.  Will get help right away if you are not doing well or get worse. This information is not intended to replace advice given to you by your health care provider. Make sure you discuss any questions you have with your health care provider. Document Released: 12/05/2006 Document Revised: 05/22/2013 Document Reviewed: 04/08/2013 Elsevier Interactive Patient Education  2017 [employer].

## 2016-07-04 NOTE — Progress Notes (Signed)
MEDICARE ANNUAL WELLNESS VISIT AND CPE  Assessment:   Essential hypertension - continue medications, DASH diet, exercise and monitor at home. Call if greater than 130/80.  - CBC with Differential/Platelet - BASIC METABOLIC PANEL WITH GFR - Hepatic function panel - TSH - Urinalysis, Routine w reflex microscopic (not at [facility]) - Microalbumin / creatinine urine ratio - EKG 12-Lead  Other abnormal glucose Discussed general issues about diabetes pathophysiology and management., Educational material distributed., Suggested low cholesterol diet., Encouraged aerobic exercise., Discussed foot care., Reminded to get yearly retinal exam. - Hemoglobin A1c - Insulin, fasting  Hyperlipidemia - pravastatin (PRAVACHOL) 40 MG tablet; Take 1 tablet (40 mg total) by mouth daily.  Dispense: 90 tablet; Refill: 0 - Lipid panel  Depression, remission Continue medications, suggest counseling  Vitamin D deficiency Continue supplement  Encounter for long-term (current) use of medications - Magnesium   Migraine without aura and without status migrainosus, not intractable Avoid triggers, normal neuro   Lumbar stenosis monitor   Anemia, unspecified anemia type - Iron and TIBC - Ferritin - Vitamin B12   Allergy, subsequent encounter Continue OTC allergy pills  Anxiety continue medications, stress management techniques discussed, increase water, good sleep hygiene discussed, increase exercise, and increase veggies.    Encounter for Medicare annual wellness exam Shingles today, DUE for colonoscopy, given number to call.   Glaucoma Follow up eye doctor  BMI 27.0-27.9,adult Obesity with co morbidities- long discussion about weight loss, diet, and exercise   Seb dermatitis Can schedule for freezing.   Bunions and feet pain Will refer to podiatry  Over 40 minutes of exam, counseling, chart review and critical decision making was performed  Plan:   During the course of the visit the  patient was educated and counseled about appropriate screening and preventive services including:    Pneumococcal vaccine   Prevnar 13  Influenza vaccine  Td vaccine  Screening electrocardiogram  Bone densitometry screening  Colorectal cancer screening  Diabetes screening  Glaucoma screening  Nutrition counseling   Advanced directives: requested   Subjective:  Keven Shibley is a 70 y.o. female who presents for Medicare Annual Wellness Visit and complete physical.    Her blood pressure has been controlled at home, today BP: 138/72  She does not workout. She denies chest pain, shortness of breath, dizziness.  She is on cholesterol medication and denies myalgias. Her cholesterol is at goal. The cholesterol last visit was:   Lab Results  Component Value Date   CHOL 140 03/30/2016   HDL 58 03/30/2016   LDLCALC 62 03/30/2016   TRIG 100 03/30/2016   CHOLHDL 2.4 03/30/2016    She has been working on diet and exercise for prediabetes, and denies paresthesia of the feet, polydipsia, polyuria and visual disturbances. Last A1C in the office was:  Lab Results  Component Value Date   HGBA1C 5.2 03/30/2016   Patient is on Vitamin D supplement.   Lab Results  Component Value Date   VD25OH 45 03/30/2016   She has anxiety and insomnia, she has been having trouble with her husband, her mother is sick in [place] and she has been going back and forth, and her zoloft was cut down to 25 mg and wellbutrin 300mg  was added, she is only on 1/2 a pill.  She states 2 years ago she had splinter on right foot and now having issues, would like to have checked.  She was having palpitations but is taking 1 verapamil at night but she stopped this, will  occ take a few.  She has a history of migraines, takes maxalt PRN which helps.  She is on Estrogen and a BASA.  BMI is Body mass index is 27.72 kg/m., she is working on diet and exercise. Wt Readings from Last 3 Encounters:  07/04/16 154 lb  (69.9 kg)  05/23/16 150 lb (68 kg)  03/30/16 153 lb 6.4 oz (69.6 kg)    Medication Review: Current Outpatient Prescriptions on File Prior to Visit  Medication Sig  . ALPRAZolam (XANAX) 0.25 MG tablet Take 1 tablet (0.25 mg total) by mouth 3 (three) times daily as needed.  . Ascorbic Acid (VITAMIN C PO) 500 mg 2 (two) times daily.  Marland Kitchen aspirin 325 MG tablet Take 325 mg by mouth daily.  Marland Kitchen buPROPion (WELLBUTRIN XL) 300 MG 24 hr tablet Take 1 tablet every morning for mood & concentration  . Cholecalciferol (VITAMIN D PO) Take by mouth. Takes 10000 to 12000 units daily.  Marland Kitchen estradiol (ESTRACE) 0.5 MG tablet TAKE 1 TABLET BY MOUTH DAILY  . fexofenadine (ALLEGRA) 180 MG tablet Take 180 mg by mouth daily.  . pravastatin (PRAVACHOL) 40 MG tablet TAKE 1 TABLET BY MOUTH EVERY DAY  . progesterone (PROMETRIUM) 100 MG capsule TAKE 1 CAPSULE BY MOUTH EVERY DAY  . rizatriptan (MAXALT) 10 MG tablet TAKE 1 TABLET BY MOUTH AS NEEDED FOR MIGRAINE. MAY REPEAT DOSE IN 2 HOURS IF NEEDED  . traZODone (DESYREL) 150 MG tablet TAKE 1/3 TO 1 TABLET BY MOUTH 1 HOUR PRIOR TO BEDTIME AS NEEDED FOR SLEEP   No current facility-administered medications on file prior to visit.      Current Problems (verified)  Screening Tests Immunization History  Administered Date(s) Administered  . Influenza, High Dose Seasonal PF 05/29/2014, 06/02/2015  . Influenza,inj,quad, With Preservative 08/12/2013  . Pneumococcal Conjugate-13 04/07/2015  . Pneumococcal Polysaccharide-23 05/29/2013  . Td 08/15/2005    Prior vaccinations: TD or Tdap: 2007 DUE  Influenza: 2016 DUE Pneumococcal: 2014 Prevnar13: 2016 Shingles/Zostavax: 2016  Preventative care: Last colonoscopy: 2006 DUE NEEDS TO SCHEDULE Last mammogram: 10/2015 Last pap smear/pelvic exam: 2014 negative [place] AB 2008 MRI lumbar 02/2014 CXR 2008 DEXA: 2000, declines  Names of Other Physician/Practitioners you currently use: 1. [facility] here for primary care 2. [facility], eye doctor, last visit Aug 2016, OVER DUE 3. Dr. Evelene Croon, dentist, last visit 06//2016 Patient Care Team: Unk Pinto, MD as PCP - General (Internal Medicine) Garald Balding, MD as Consulting Physician (Orthopedic Surgery) Laurence Spates, MD as Consulting Physician (Gastroenterology) Avon Gully, NP as Nurse Practitioner (Obstetrics and Gynecology)  SURGICAL HISTORY Past Surgical History:  Procedure Laterality Date  . CHOLECYSTECTOMY  1999   FAMILY HISTORY Family History  Problem Relation Age of Onset  . Stroke Mother   . Hypertension Mother   . Heart attack Father    SOCIAL HISTORY Social History  Substance Use Topics  . Smoking status: Never Smoker  . Smokeless tobacco: Not on file  . Alcohol use 1.2 oz/week    2 Standard drinks or equivalent per week     Comment: Rare-Wine    MEDICARE WELLNESS OBJECTIVES: Tobacco use: She does not smoke.  Patient is not a former smoker. Alcohol Current alcohol use: glass of wine with dinner Osteoporosis: postmenopausal estrogen deficiency and dietary calcium and/or vitamin D deficiency, History of fracture in the past year: no Fall risk: Low Risk Hearing: normal Visual acuity: normal,  does perform annual eye exam Diet: in general,  a "healthy" diet   Physical activity: Current Exercise Habits: The patient does not participate in regular exercise at present Cardiac risk factors: Cardiac Risk Factors include: advanced age (>6men, >58 women);dyslipidemia;hypertension;sedentary lifestyle Depression/mood screen:   Depression screen [facility] 2/9 07/04/2016  Decreased Interest 0  Down, Depressed, Hopeless 0  PHQ - 2 Score 0  Altered sleeping -  Tired, decreased energy -  Change in appetite -  Feeling bad or failure about yourself  -  Trouble concentrating -  Moving slowly or fidgety/restless -  Suicidal thoughts -  PHQ-9 Score -  Difficult doing work/chores -    ADLs:  In  your present state of health, do you have any difficulty performing the following activities: 07/04/2016 03/30/2016  Hearing? N N  Vision? N N  Difficulty concentrating or making decisions? N Y  Walking or climbing stairs? N N  Dressing or bathing? N N  Doing errands, shopping? N N  Some recent data might be hidden     Cognitive Testing  Alert? Yes  Normal Appearance?Yes  Oriented to person? Yes  Place? Yes   Time? Yes  Recall of three objects?  Yes  Can perform simple calculations? Yes  Displays appropriate judgment?Yes  Can read the correct time from a watch face?Yes  EOL planning: Does patient have an advance directive?: No Would patient like information on creating an advanced directive?: Yes - Educational materials given  Review of Systems  Constitutional: Negative for chills, diaphoresis and fever.  HENT: Negative for congestion, ear discharge, ear pain, hearing loss, nosebleeds, sore throat and tinnitus.   Eyes: Negative.   Respiratory: Negative.  Negative for shortness of breath and stridor.   Cardiovascular: Negative for chest pain, palpitations, orthopnea, claudication, leg swelling and PND.  Gastrointestinal: Negative.  Negative for constipation and diarrhea.  Genitourinary: Negative.   Musculoskeletal: Positive for myalgias. Negative for back pain, falls, joint pain and neck pain.  Skin: Negative.   Neurological: Positive for headaches. Negative for dizziness, tingling, tremors, sensory change, speech change, focal weakness, seizures, loss of consciousness and weakness.  Endo/Heme/Allergies: Negative.   Psychiatric/Behavioral: Positive for depression and memory loss. Negative for hallucinations, substance abuse and suicidal ideas. The patient is nervous/anxious and has insomnia.      Objective:     Today's Vitals   07/04/16 0910  BP: 138/72  Pulse: 76  Resp: 16  Temp: 98 F (36.7 C)  TempSrc: Temporal  Weight: 154 lb (69.9 kg)  Height: 5' 2.5" (1.588 m)    Body mass index is 27.72 kg/m.  General appearance: alert, no distress, WD/WN, female HEENT: normocephalic, sclerae anicteric, TMs pearly, nares patent, no discharge or erythema, pharynx normal Oral cavity: MMM, no lesions Neck: supple, no lymphadenopathy, no thyromegaly, no masses Heart: RRR, normal S1, S2, no murmurs Lungs: CTA bilaterally, no wheezes, rhonchi, or rales Abdomen: +bs, soft, non tender, non distended, no masses, no hepatomegaly, no splenomegaly Musculoskeletal: nontender, no swelling, no obvious deformity Extremities: no edema, no cyanosis, no clubbing Pulses: 2+ symmetric, upper and lower extremities, normal cap refill Neurological: alert, oriented x 3, CN2-12 intact, strength normal upper extremities and lower extremities, sensation normal throughout, DTRs 2+ throughout, no cerebellar signs, gait normal Psychiatric: normal affect, behavior normal, pleasant  Feet: bilateral bunions, right foot with 2-3 callouses 2-3 mm each that are tender to touch.   EKG: NSR, no ST changes.   Medicare Attestation I have personally reviewed: The patient's medical and social history Their use of alcohol, tobacco or illicit  drugs Their current medications and supplements The patient's functional ability including ADLs,fall risks, home safety risks, cognitive, and hearing and visual impairment Diet and physical activities Evidence for depression or mood disorders  The patient's weight, height, BMI, and visual acuity have been recorded in the chart.  I have made referrals, counseling, and provided education to the patient based on review of the above and I have provided the patient with a written personalized care plan for preventive services.     Vicie Mutters, PA-C   07/04/2016

## 2016-07-05 LAB — URINALYSIS, ROUTINE W REFLEX MICROSCOPIC
GLUCOSE, UA: NEGATIVE
Hgb urine dipstick: NEGATIVE
Ketones, ur: NEGATIVE
LEUKOCYTES UA: NEGATIVE
Nitrite: NEGATIVE
PROTEIN: NEGATIVE
SPECIFIC GRAVITY, URINE: 1.025 (ref 1.001–1.035)
pH: 5.5 (ref 5.0–8.0)

## 2016-07-05 LAB — MICROALBUMIN / CREATININE URINE RATIO
CREATININE, URINE: 166 mg/dL (ref 20–320)
MICROALB/CREAT RATIO: 4 ug/mg{creat} (ref <30)
Microalb, Ur: 0.7 mg/dL

## 2016-07-06 DIAGNOSIS — J40 Bronchitis, not specified as acute or chronic: Secondary | ICD-10-CM | POA: Diagnosis not present

## 2016-07-06 DIAGNOSIS — Z136 Encounter for screening for cardiovascular disorders: Secondary | ICD-10-CM | POA: Diagnosis not present

## 2016-07-06 DIAGNOSIS — I10 Essential (primary) hypertension: Secondary | ICD-10-CM | POA: Diagnosis not present

## 2016-07-06 DIAGNOSIS — Z13220 Encounter for screening for lipoid disorders: Secondary | ICD-10-CM | POA: Diagnosis not present

## 2016-07-06 DIAGNOSIS — Z131 Encounter for screening for diabetes mellitus: Secondary | ICD-10-CM | POA: Diagnosis not present

## 2016-07-06 DIAGNOSIS — R05 Cough: Secondary | ICD-10-CM | POA: Diagnosis not present

## 2016-07-06 DIAGNOSIS — G43909 Migraine, unspecified, not intractable, without status migrainosus: Secondary | ICD-10-CM | POA: Diagnosis not present

## 2016-07-06 DIAGNOSIS — E139 Other specified diabetes mellitus without complications: Secondary | ICD-10-CM | POA: Diagnosis not present

## 2016-07-06 DIAGNOSIS — Z79899 Other long term (current) drug therapy: Secondary | ICD-10-CM | POA: Diagnosis not present

## 2016-07-06 DIAGNOSIS — E785 Hyperlipidemia, unspecified: Secondary | ICD-10-CM | POA: Diagnosis not present

## 2016-07-19 ENCOUNTER — Other Ambulatory Visit: Payer: Self-pay | Admitting: Physician Assistant

## 2016-07-19 DIAGNOSIS — F411 Generalized anxiety disorder: Secondary | ICD-10-CM

## 2016-07-19 NOTE — Telephone Encounter (Signed)
Please call Alprazolam 

## 2016-07-20 NOTE — Telephone Encounter (Signed)
Xanax was called into [facility] @ 8:36am

## 2016-07-27 ENCOUNTER — Encounter: Payer: Self-pay | Admitting: Physician Assistant

## 2016-07-27 DIAGNOSIS — F411 Generalized anxiety disorder: Secondary | ICD-10-CM

## 2016-07-28 MED ORDER — ALPRAZOLAM 0.25 MG PO TABS: 0.2500 mg | ORAL_TABLET | Freq: Three times a day (TID) | ORAL | 1 refills | Status: DC | PRN

## 2016-08-01 ENCOUNTER — Other Ambulatory Visit: Payer: Self-pay | Admitting: *Deleted

## 2016-08-02 ENCOUNTER — Encounter: Payer: Self-pay | Admitting: Podiatry

## 2016-08-02 ENCOUNTER — Ambulatory Visit (INDEPENDENT_AMBULATORY_CARE_PROVIDER_SITE_OTHER): Payer: Medicare Other | Admitting: Podiatry

## 2016-08-02 DIAGNOSIS — Q828 Other specified congenital malformations of skin: Secondary | ICD-10-CM

## 2016-08-02 DIAGNOSIS — B351 Tinea unguium: Secondary | ICD-10-CM

## 2016-08-02 NOTE — Progress Notes (Signed)
   Subjective:    Patient ID: Rebekah Hamilton, female    DOB: [birth year], 70 y.o.   MRN: [id number]  HPI: She presents today for a chief complaint of painful lesions to the plantar aspect of the right foot she states that she's had for a few years now since she had splinters while walking on a wooden deck. He said they're painful this summer with lavage and sandals.    Review of Systems  All other systems reviewed and are negative.      Objective:   Physical Exam: Vital signs are stable she is alert and oriented 3. Pulses are palpable. Neurologic sensorium is intact presents was C monofilament. Deep tendon reflexes are intact bilaterally and muscle strength is 5 over 5 dorsiflexion sutures all intrinsic musculature is intact. Orthopedic evaluation demonstrates mild pes planus with hammertoe deformities and hallux abductovalgus from his moderate to severe in nature. No radiographs are taken today. 2 porokeratotic lesions are noted subsecond metatarsophalangeal joint of the right foot was debrided do not demonstrate any foreign bodies.        Assessment & Plan:  Assessment: Porokeratosis right foot. Has abductovalgus deformity bilateral.  Plan: Debrided direct hyperkeratosis today mechanically and placed salicylic acid under occlusion for 3 days without getting this will follow-up with me in 6 weeks and we did discuss the possible need for surgery.

## 2016-09-08 ENCOUNTER — Ambulatory Visit (INDEPENDENT_AMBULATORY_CARE_PROVIDER_SITE_OTHER): Payer: Medicare Other | Admitting: Internal Medicine

## 2016-09-08 ENCOUNTER — Encounter: Payer: Self-pay | Admitting: Internal Medicine

## 2016-09-08 ENCOUNTER — Telehealth: Payer: Self-pay | Admitting: *Deleted

## 2016-09-08 VITALS — BP 126/62 | HR 64 | Temp 98.0°F | Resp 16 | Ht 62.5 in

## 2016-09-08 DIAGNOSIS — F321 Major depressive disorder, single episode, moderate: Secondary | ICD-10-CM | POA: Diagnosis not present

## 2016-09-08 MED ORDER — DULOXETINE HCL 30 MG PO CPEP: 30.0000 mg | ORAL_CAPSULE | Freq: Every day | ORAL | 2 refills | Status: DC

## 2016-09-08 NOTE — Patient Instructions (Signed)

## 2016-09-08 NOTE — Telephone Encounter (Signed)
Patient called and states Prozac is causing dizziness and she is still severely depressed.  Per Dr Melford Aase, patient can stop the Prozac and needs OV to discuss other treatment.  Patient is aware and appointment scheduled.
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
## 2016-09-13 ENCOUNTER — Ambulatory Visit: Payer: Medicare Other | Admitting: Podiatry

## 2016-09-20 DIAGNOSIS — H9042 Sensorineural hearing loss, unilateral, left ear, with unrestricted hearing on the contralateral side: Secondary | ICD-10-CM | POA: Diagnosis not present

## 2016-09-20 DIAGNOSIS — H6121 Impacted cerumen, right ear: Secondary | ICD-10-CM | POA: Diagnosis not present

## 2016-09-20 DIAGNOSIS — H6522 Chronic serous otitis media, left ear: Secondary | ICD-10-CM | POA: Diagnosis not present

## 2016-09-20 DIAGNOSIS — H8142 Vertigo of central origin, left ear: Secondary | ICD-10-CM | POA: Diagnosis not present

## 2016-09-21 DIAGNOSIS — H903 Sensorineural hearing loss, bilateral: Secondary | ICD-10-CM | POA: Diagnosis not present

## 2016-09-21 DIAGNOSIS — R42 Dizziness and giddiness: Secondary | ICD-10-CM | POA: Diagnosis not present

## 2016-10-05 ENCOUNTER — Other Ambulatory Visit: Payer: Self-pay | Admitting: Internal Medicine

## 2016-10-06 ENCOUNTER — Ambulatory Visit (INDEPENDENT_AMBULATORY_CARE_PROVIDER_SITE_OTHER): Payer: Medicare Other | Admitting: Internal Medicine

## 2016-10-06 VITALS — BP 132/76 | HR 64 | Temp 97.8°F | Resp 16 | Ht 62.5 in | Wt 147.6 lb

## 2016-10-06 DIAGNOSIS — E782 Mixed hyperlipidemia: Secondary | ICD-10-CM

## 2016-10-06 DIAGNOSIS — R7303 Prediabetes: Secondary | ICD-10-CM

## 2016-10-06 DIAGNOSIS — I10 Essential (primary) hypertension: Secondary | ICD-10-CM

## 2016-10-06 DIAGNOSIS — E559 Vitamin D deficiency, unspecified: Secondary | ICD-10-CM | POA: Diagnosis not present

## 2016-10-06 DIAGNOSIS — Z79899 Other long term (current) drug therapy: Secondary | ICD-10-CM | POA: Diagnosis not present

## 2016-10-06 LAB — CBC WITH DIFFERENTIAL/PLATELET
BASOS ABS: 0 {cells}/uL (ref 0–200)
Basophils Relative: 0 %
Eosinophils Absolute: 186 cells/uL (ref 15–500)
Eosinophils Relative: 2 %
HEMATOCRIT: 44.4 % (ref 35.0–45.0)
HEMOGLOBIN: 14.6 g/dL (ref 11.7–15.5)
LYMPHS ABS: 3534 {cells}/uL (ref 850–3900)
Lymphocytes Relative: 38 %
MCH: 29.6 pg (ref 27.0–33.0)
MCHC: 32.9 g/dL (ref 32.0–36.0)
MCV: 90.1 fL (ref 80.0–100.0)
MONO ABS: 837 {cells}/uL (ref 200–950)
MPV: 10.5 fL (ref 7.5–12.5)
Monocytes Relative: 9 %
NEUTROS PCT: 51 %
Neutro Abs: 4743 cells/uL (ref 1500–7800)
Platelets: 302 10*3/uL (ref 140–400)
RBC: 4.93 MIL/uL (ref 3.80–5.10)
RDW: 13.2 % (ref 11.0–15.0)
WBC: 9.3 10*3/uL (ref 3.8–10.8)

## 2016-10-06 NOTE — Patient Instructions (Signed)

## 2016-10-06 NOTE — Progress Notes (Signed)
[facility] Unk Pinto, M.D.        Uvaldo Bristle. Silverio Lay, P.A.-C       Starlyn Skeans, P.A.-C  [facility]                [address] [place] [id number] Telephone [phone] Telefax [phone] ______________________________________________________________________     This very nice 71 y.o. separated WF presents for  follow up with Hypertension, Hyperlipidemia, Pre-Diabetes and Vitamin D Deficiency.      Patient has hx/o elevated BP and is monitored expectantly for elevated BP & BP has been controlled at home. Today's BP is at goal - 132/76. Patient has had no complaints of any cardiac type chest pain, palpitations, dyspnea/orthopnea/PND, dizziness, claudication, or dependent edema.     Hyperlipidemia is controlled with diet & meds. Patient denies myalgias or other med SE's. Last Lipids were at goal: Lab Results  Component Value Date   CHOL 136 07/04/2016   HDL 52 07/04/2016   LDLCALC 66 07/04/2016   TRIG 89 07/04/2016   CHOLHDL 2.6 07/04/2016      Also, the patient has history of PreDiabetes (A1c 5.8% in Mar 2015) and has had no symptoms of reactive hypoglycemia, diabetic polys, paresthesias or visual blurring.  Last A1c was at goal: Lab Results  Component Value Date   HGBA1C 5.2 03/30/2016      Further, the patient also has history of Vitamin D Deficiency ("36" in Mar 2016)  and supplements vitamin D without any suspected side-effects. Last vitamin D was still low:  Lab Results  Component Value Date   VD25OH 45 03/30/2016   Current Outpatient Prescriptions on File Prior to Visit  Medication Sig  . ALPRAZolam (XANAX) 0.25 MG tablet Take 1 tab 3 x daily as needed.  Marland Kitchen VITAMIN C  500 mg  2  times daily.  Marland Kitchen aspirin 325 MG tablet Take  daily.  Marland Kitchen VITAMIN D Take by mouth. Takes 10000 to 12000 units daily.  . DULoxetine  30 MG capsule Take 1 capsule (30 mg total) by mouth daily.  Marland Kitchen  estradiol 0.5 MG tablet TAKE 1 TABLET BY MOUTH DAILY  . fexofenadine  180 MG tablet Take 180 mg by mouth daily.  . pravastatin  40 MG tablet TAKE 1 TABLET BY MOUTH EVERY DAY  . progesterone100 MG capsule TAKE 1 CAPSULE BY MOUTH EVERY DAY  . rizatriptan  10 MG tablet TAKE 1 TABLET BY MOUTH AS NEEDED FOR MIGRAINE. MAY REPEAT DOSE IN 2 HOURS IF NEEDED  . traZODone  150 MG tablet TAKE 1/3 TO 1 TABLET BY MOUTH 1 HOUR PRIOR TO BEDTIME AS NEEDED FOR SLEEP   Allergies  Allergen Reactions  . Augmentin [Amoxicillin-Pot Clavulanate] Diarrhea  . Prozac [Fluoxetine Hcl] Disoriented  . Wellbutrin [Bupropion] Insomnia/Nervousness   PMHx:   Past Medical History:  Diagnosis Date  . Allergy   . Anemia   . Anxiety   . Depression   . Hyperlipidemia   . Hypertension   . Lumbar stenosis   . Migraines   . Prediabetes    Immunization History  Administered Date(s) Administered  . Influenza, High Dose Seasonal PF 05/29/2014, 06/02/2015  . Influenza,inj,quad, With Preservative 08/12/2013, 07/04/2016  . Pneumococcal Conjugate-13 04/07/2015  . Pneumococcal Polysaccharide-23 05/29/2013  . Td 08/15/2005, 07/04/2016   Past Surgical History:  Procedure Laterality Date  . CHOLECYSTECTOMY  1999  FHx:    Reviewed / unchanged  SHx:    Reviewed / unchanged  Systems Review:  Constitutional: Denies fever, chills, wt changes, headaches, insomnia, fatigue, night sweats, change in appetite. Eyes: Denies redness, blurred vision, diplopia, discharge, itchy, watery eyes.  ENT: Denies discharge, congestion, post nasal drip, epistaxis, sore throat, earache, hearing loss, dental pain, tinnitus, vertigo, sinus pain, snoring.  CV: Denies chest pain, palpitations, irregular heartbeat, syncope, dyspnea, diaphoresis, orthopnea, PND, claudication or edema. Respiratory: denies cough, dyspnea, DOE, pleurisy, hoarseness, laryngitis, wheezing.  Gastrointestinal: Denies dysphagia, odynophagia, heartburn, reflux, water brash,  abdominal pain or cramps, nausea, vomiting, bloating, diarrhea, constipation, hematemesis, melena, hematochezia  or hemorrhoids. Genitourinary: Denies dysuria, frequency, urgency, nocturia, hesitancy, discharge, hematuria or flank pain. Musculoskeletal: Denies arthralgias, myalgias, stiffness, jt. swelling, pain, limping or strain/sprain.  Skin: Denies pruritus, rash, hives, warts, acne, eczema or change in skin lesion(s). Neuro: No weakness, tremor, incoordination, spasms, paresthesia or pain. Psychiatric: Denies confusion, memory loss or sensory loss. Endo: Denies change in weight, skin or hair change.  Heme/Lymph: No excessive bleeding, bruising or enlarged lymph nodes.  Physical Exam  BP 132/76   P 64   T 97.8 F   R 16   Ht 5' 2.5"    Wt 147 lb 9.6 oz    BMI 26.57   Appears well nourished and in no distress.  Eyes: PERRLA, EOMs, conjunctiva no swelling or erythema. Sinuses: No frontal/maxillary tenderness ENT/Mouth: EAC's clear, TM's nl w/o erythema, bulging. Nares clear w/o erythema, swelling, exudates. Oropharynx clear without erythema or exudates. Oral hygiene is good. Tongue normal, non obstructing. Hearing intact.  Neck: Supple. Thyroid nl. Car 2+/2+ without bruits, nodes or JVD. Chest: Respirations nl with BS clear & equal w/o rales, rhonchi, wheezing or stridor.  Cor: Heart sounds normal w/ regular rate and rhythm without sig. murmurs, gallops, clicks, or rubs. Peripheral pulses normal and equal  without edema.  Abdomen: Soft & bowel sounds normal. Non-tender w/o guarding, rebound, hernias, masses, or organomegaly.  Lymphatics: Unremarkable.  Musculoskeletal: Full ROM all peripheral extremities, joint stability, 5/5 strength, and normal gait.  Skin: Warm, dry without exposed rashes, lesions or ecchymosis apparent.  Neuro: Cranial nerves intact, reflexes equal bilaterally. Sensory-motor testing grossly intact. Tendon reflexes grossly intact.  Pysch: Alert & oriented x 3.   Insight and judgement nl & appropriate. No ideations.  Assessment and Plan:    1. Labile Hypertension  - Continue monitor blood pressure at home.  - Continue DASH diet. Reminder to go to the ER if any CP,  SOB, nausea, dizziness, severe HA, changes vision/speech,  left arm numbness and tingling and jaw pain.  - CBC with Differential/Platelet - BASIC METABOLIC PANEL WITH GFR - TSH  2. Mixed hyperlipidemia  - Continue diet/meds, exercise,& lifestyle modifications.  - Continue monitor periodic cholesterol/liver & renal functions   - Hepatic function panel - Lipid panel - TSH  3. Prediabetes  - Continue diet, exercise, lifestyle modifications.  - Monitor appropriate labs.  - Hemoglobin A1c - Insulin, random  4. Vitamin D deficiency  - Continue supplementation.  - VITAMIN D 25 Hydroxy   5. Medication management  - CBC with Differential/Platelet - BASIC METABOLIC PANEL WITH GFR - Hepatic function panel - Magnesium - Lipid panel - TSH - Hemoglobin A1c - Insulin, random - VITAMIN D 25 Hydroxy        Recommended regular exercise, BP monitoring, weight control, and discussed med and SE's. Recommended labs to assess and monitor clinical status. Further disposition  pending results of labs. Over 30 minutes of exam, counseling, chart review was performed

## 2016-10-07 ENCOUNTER — Encounter: Payer: Self-pay | Admitting: Internal Medicine

## 2016-10-07 LAB — HEPATIC FUNCTION PANEL
ALT: 17 U/L (ref 6–29)
AST: 17 U/L (ref 10–35)
Albumin: 4.1 g/dL (ref 3.6–5.1)
Alkaline Phosphatase: 52 U/L (ref 33–130)
BILIRUBIN INDIRECT: 0.2 mg/dL (ref 0.2–1.2)
Bilirubin, Direct: 0.1 mg/dL (ref <=0.2)
Total Bilirubin: 0.3 mg/dL (ref 0.2–1.2)
Total Protein: 6.4 g/dL (ref 6.1–8.1)

## 2016-10-07 LAB — BASIC METABOLIC PANEL WITH GFR
BUN: 15 mg/dL (ref 7–25)
CALCIUM: 9.4 mg/dL (ref 8.6–10.4)
CO2: 24 mmol/L (ref 20–31)
CREATININE: 0.84 mg/dL (ref 0.50–0.99)
Chloride: 106 mmol/L (ref 98–110)
GFR, Est African American: 82 mL/min (ref >=60)
GFR, Est Non African American: 71 mL/min (ref >=60)
Glucose, Bld: 77 mg/dL (ref 65–99)
Potassium: 4.4 mmol/L (ref 3.5–5.3)
Sodium: 141 mmol/L (ref 135–146)

## 2016-10-07 LAB — HEMOGLOBIN A1C
Hgb A1c MFr Bld: 5.2 % (ref <5.7)
MEAN PLASMA GLUCOSE: 103 mg/dL

## 2016-10-07 LAB — TSH: TSH: 2.37 m[IU]/L

## 2016-10-07 LAB — LIPID PANEL
CHOLESTEROL: 124 mg/dL (ref <200)
HDL: 52 mg/dL (ref >50)
LDL Cholesterol: 52 mg/dL (ref <100)
Total CHOL/HDL Ratio: 2.4 Ratio (ref <5.0)
Triglycerides: 100 mg/dL (ref <150)
VLDL: 20 mg/dL (ref <30)

## 2016-10-07 LAB — VITAMIN D 25 HYDROXY (VIT D DEFICIENCY, FRACTURES): Vit D, 25-Hydroxy: 38 ng/mL (ref 30–100)

## 2016-10-07 LAB — INSULIN, RANDOM: Insulin: 6.7 u[IU]/mL (ref 2.0–19.6)

## 2016-10-07 LAB — MAGNESIUM: Magnesium: 2 mg/dL (ref 1.5–2.5)

## 2016-10-10 ENCOUNTER — Other Ambulatory Visit: Payer: Self-pay | Admitting: Internal Medicine

## 2016-10-10 MED ORDER — PROGESTERONE MICRONIZED 100 MG PO CAPS: ORAL_CAPSULE | ORAL | 1 refills | Status: DC

## 2016-10-14 DIAGNOSIS — Z1151 Encounter for screening for human papillomavirus (HPV): Secondary | ICD-10-CM | POA: Diagnosis not present

## 2016-10-14 DIAGNOSIS — Z1231 Encounter for screening mammogram for malignant neoplasm of breast: Secondary | ICD-10-CM | POA: Diagnosis not present

## 2016-10-14 DIAGNOSIS — N951 Menopausal and female climacteric states: Secondary | ICD-10-CM | POA: Diagnosis not present

## 2016-10-14 DIAGNOSIS — N760 Acute vaginitis: Secondary | ICD-10-CM | POA: Diagnosis not present

## 2016-10-14 DIAGNOSIS — Z124 Encounter for screening for malignant neoplasm of cervix: Secondary | ICD-10-CM | POA: Diagnosis not present

## 2016-12-27 ENCOUNTER — Encounter: Payer: Self-pay | Admitting: *Deleted

## 2017-01-10 ENCOUNTER — Other Ambulatory Visit: Payer: Self-pay | Admitting: Internal Medicine

## 2017-02-07 ENCOUNTER — Ambulatory Visit: Payer: Self-pay | Admitting: Internal Medicine

## 2017-02-09 ENCOUNTER — Encounter (INDEPENDENT_AMBULATORY_CARE_PROVIDER_SITE_OTHER): Payer: Self-pay | Admitting: Orthopedic Surgery

## 2017-02-09 ENCOUNTER — Ambulatory Visit (INDEPENDENT_AMBULATORY_CARE_PROVIDER_SITE_OTHER): Payer: Medicare Other | Admitting: Orthopedic Surgery

## 2017-02-09 VITALS — BP 159/79 | HR 67 | Resp 14 | Ht 64.0 in | Wt 150.0 lb

## 2017-02-09 DIAGNOSIS — M5417 Radiculopathy, lumbosacral region: Secondary | ICD-10-CM | POA: Diagnosis not present

## 2017-02-09 NOTE — Progress Notes (Signed)
Office Visit Note   Patient: Rebekah Hamilton           Date of Birth: Jul 21, 1946           MRN: [id number] Visit Date: 02/09/2017              Requested by: Unk Pinto, [address] [place], [place] [address] PCP: Unk Pinto, MD   Assessment & Plan: Visit Diagnoses:  1. Radiculopathy, lumbosacral region     Plan:  #1: Since her symptoms have changed and is now more of her radiculopathy to the dorsum of the foot the plan will be to obtain an MRI scan to delineate pathology. She has had previous spinal stenosis foraminal stenosis. #2: We'll schedule her for epidurals with [facility] radiology once the scan is completed #3: She will call for her MRI results.  Follow-Up Instructions: Return if symptoms worsen or fail to improve.   Orders:  Orders Placed This Encounter  Procedures  . MR Lumbar Spine w/o contrast  . Ambulatory referral to Physical Medicine Rehab   No orders of the defined types were placed in this encounter.     Procedures: No procedures performed   Clinical Data: No additional findings.   Subjective: Chief Complaint  Patient presents with  . Lower Back - Pain    Pt presents with chronic LBP with Right sided leg tingling to her foot. She wants to go to [facility] for her injection and she had last one    Rebekah Hamilton is a pleasant 71 year old white female who presents with chronic low back pain with right-sided leg tingling now to her foot. She had previously had very similar symptoms but now have progressed to her foot anteriorly. She has had injections by [facility] imaging which had been beneficial. She comes in today for reevaluation. She denies any recent history of urinary incontinence or bowel incontinence. Denies any recent history of injury or trauma.    Review of Systems  Constitutional: Negative.   HENT: Negative.   Respiratory: Negative.   Cardiovascular: Negative.   Gastrointestinal: Negative.     Genitourinary: Negative.   Skin: Negative.   Neurological: Negative.   Hematological: Negative.   Psychiatric/Behavioral: Negative.      Objective: Vital Signs: BP (!) 159/79   Pulse 67   Resp 14   Ht 5\' 4"  (1.626 m)   Wt 150 lb (68 kg)   BMI 25.75 kg/m   Physical Exam  Constitutional: She is oriented to person, place, and time. She appears well-developed and well-nourished.  HENT:  Head: Normocephalic and atraumatic.  Eyes: EOM are normal. Pupils are equal, round, and reactive to light.  Pulmonary/Chest: Effort normal.  Neurological: She is alert and oriented to person, place, and time.  Skin: Skin is warm and dry.  Psychiatric: She has a normal mood and affect. Her behavior is normal. Judgment and thought content normal.    Ortho Exam  Exam today reveals negative straight leg raising bilaterally. She's got fairly good strength maybe a little bit decreased on the right with dorsiflexion stents. Deep tendon reflexes were absent in the knees bilaterally. Trace on the right in the Achilles absent on the left. Sensation to light touch is bilateral and equal in both extremities. Good motion of the hips.  Specialty Comments:  No specialty comments available.  Imaging: No results found.   PMFS History: Patient Active Problem List   Diagnosis Date Noted  . Encounter for Medicare annual wellness exam 06/02/2015  .  Glaucoma 06/02/2015  . Vitamin D deficiency 10/21/2014  . Medication management 12/01/2013  . Hyperlipidemia   . Hypertension   . Other abnormal glucose   . Migraines   . Allergy   . Anemia   . Anxiety   . Depression, major, recurrent, in remission ([place])   . Lumbar stenosis    Past Medical History:  Diagnosis Date  . Allergy   . Anemia   . Anxiety   . Depression   . Hyperlipidemia   . Hypertension   . Lumbar stenosis   . Migraines   . Prediabetes     Family History  Problem Relation Age of Onset  . Stroke Mother   . Hypertension Mother   .  Heart attack Father     Past Surgical History:  Procedure Laterality Date  . CHOLECYSTECTOMY  1999   Social History   Occupational History  . Not on file.   Social History Main Topics  . Smoking status: Never Smoker  . Smokeless tobacco: Never Used  . Alcohol use 1.2 oz/week    2 Standard drinks or equivalent per week     Comment: Rare-Wine  . Drug use: No  . Sexual activity: Not on file

## 2017-02-11 ENCOUNTER — Ambulatory Visit
Admission: RE | Admit: 2017-02-11 | Discharge: 2017-02-11 | Disposition: A | Discharge status: Home / Self Care | Payer: Medicare Other | Source: Ambulatory Visit | Attending: Orthopedic Surgery | Admitting: Orthopedic Surgery

## 2017-02-11 DIAGNOSIS — M5126 Other intervertebral disc displacement, lumbar region: Secondary | ICD-10-CM | POA: Diagnosis not present

## 2017-02-16 ENCOUNTER — Other Ambulatory Visit (INDEPENDENT_AMBULATORY_CARE_PROVIDER_SITE_OTHER): Payer: Self-pay | Admitting: Orthopedic Surgery

## 2017-02-16 DIAGNOSIS — M5417 Radiculopathy, lumbosacral region: Secondary | ICD-10-CM

## 2017-02-17 NOTE — Progress Notes (Signed)
Assessment and Plan:  Essential hypertension - continue medications, DASH diet, exercise and monitor at home. Call if greater than 130/80.  -     CBC with Differential/Platelet -     BASIC METABOLIC PANEL WITH GFR -     Hepatic function panel -     TSH  Mixed hyperlipidemia -continue medications, check lipids, decrease fatty foods, increase activity.  -     Lipid panel  Depression, major, recurrent, in remission ([place]) Continue Cymbalta at 30mg  for now, may increase, suggest counseling  Medication management -     Magnesium  Migraine without status migrainosus, not intractable, unspecified migraine type Decrease rescue meds due to rebound headache Decrease stress Discussed TMJ May increase cymbalta to 60mg  Normal neuro- if any changes go to ER  Medications refilled -     clotrimazole-betamethasone (LOTRISONE) cream; Apply 1 application topically 2 (two) times daily. -     estradiol (ESTRACE) 0.5 MG tablet; Take 1 tablet (0.5 mg total) by mouth daily. -     pravastatin (PRAVACHOL) 40 MG tablet; Take 1 tablet (40 mg total) by mouth daily. -     progesterone (PROMETRIUM) 100 MG capsule; TAKE 1 CAPSULE BY MOUTH EVERY DAY -     traZODone (DESYREL) 150 MG tablet; TAKE 1/3 TO 1 TABLET BY MOUTH 1 HOUR PRIOR TO BEDTIME AS NEEDED FOR SLEEP -     rizatriptan (MAXALT) 10 MG tablet; TAKE 1 TABLET BY MOUTH AS NEEDED FOR MIGRAINE. MAY REPEAT DOSE IN 2 HOURS IF NEEDED -     Discontinue: DULoxetine (CYMBALTA) 30 MG capsule; Take 1 capsule (30 mg total) by mouth daily.     Continue diet and meds as discussed. Further disposition pending results of labs. Future Appointments Date Time Provider [place]  02/24/2017 10:40 AM [facility] RM 3 [facility]  07/24/2017 9:00 AM Vicie Mutters, PA-C [facility] None    HPI 71 y.o. female  presents for 3 month follow up with hypertension, hyperlipidemia, prediabetes and vitamin D. Her blood pressure has been controlled at home,  today their BP is BP: 140/78 She does not workout due to spinal stenosis and being busy, getting injection friday. She denies chest pain, shortness of breath, dizziness.  She is on Cymbalta for stress and she is on trazodone. She is going through divorce at this time which is causing extra stress.  She is on cholesterol medication and denies myalgias. Her cholesterol is at goal. The cholesterol last visit was:   Lab Results  Component Value Date   CHOL 124 10/06/2016   HDL 52 10/06/2016   LDLCALC 52 10/06/2016   TRIG 100 10/06/2016   CHOLHDL 2.4 10/06/2016   She has been working on diet and exercise for prediabetes, and denies paresthesia of the feet, polydipsia and polyuria. Last A1C in the office was:  Lab Results  Component Value Date   HGBA1C 5.2 10/06/2016   Patient is on Vitamin D supplement.   Lab Results  Component Value Date   VD25OH 38 10/06/2016   BMI is Body mass index is 25.47 kg/m., she is working on diet and exercise. Wt Readings from Last 3 Encounters:  02/20/17 148 lb 6.4 oz (67.3 kg)  02/09/17 150 lb (68 kg)  10/06/16 147 lb 9.6 oz (67 kg)    Current Medications:  Current Outpatient Prescriptions on File Prior to Visit  Medication Sig Dispense Refill  . ALPRAZolam (XANAX) 0.25 MG tablet Take 1 tablet (0.25 mg total) by mouth  3 (three) times daily as needed. 90 tablet 1  . Ascorbic Acid (VITAMIN C PO) 500 mg 2 (two) times daily.    Marland Kitchen aspirin 325 MG tablet Take 325 mg by mouth daily.    . Cholecalciferol (VITAMIN D PO) Take by mouth. Takes 10000 to 12000 units daily.    . DULoxetine (CYMBALTA) 30 MG capsule Take 1 capsule (30 mg total) by mouth daily. 30 capsule 2  . estradiol (ESTRACE) 0.5 MG tablet TAKE 1 TABLET BY MOUTH DAILY 90 tablet 0  . fexofenadine (ALLEGRA) 180 MG tablet Take 180 mg by mouth daily.    . pravastatin (PRAVACHOL) 40 MG tablet TAKE 1 TABLET BY MOUTH EVERY DAY 90 tablet 1  . progesterone (PROMETRIUM) 100 MG capsule TAKE 1 CAPSULE BY MOUTH  EVERY DAY 90 capsule 1  . rizatriptan (MAXALT) 10 MG tablet TAKE 1 TABLET BY MOUTH AS NEEDED FOR MIGRAINE. MAY REPEAT DOSE IN 2 HOURS IF NEEDED 12 tablet 0  . traZODone (DESYREL) 150 MG tablet TAKE 1/3 TO 1 TABLET BY MOUTH 1 HOUR PRIOR TO BEDTIME AS NEEDED FOR SLEEP 90 tablet 1   No current facility-administered medications on file prior to visit.    Medical History:  Past Medical History:  Diagnosis Date  . Allergy   . Anemia   . Anxiety   . Depression   . Hyperlipidemia   . Hypertension   . Lumbar stenosis   . Migraines   . Prediabetes    Allergies:  Allergies  Allergen Reactions  . Augmentin [Amoxicillin-Pot Clavulanate] Diarrhea  . Prozac [Fluoxetine Hcl] Other (See Comments)    Disoriented  . Wellbutrin [Bupropion] Other (See Comments)    Insomnia/Nervousness    Review of Systems  Constitutional: Negative for chills, diaphoresis and fever.  HENT: Negative for congestion, ear discharge, ear pain, hearing loss, nosebleeds, sore throat and tinnitus.   Eyes: Negative.   Respiratory: Negative for cough, shortness of breath and stridor.   Cardiovascular: Negative for chest pain, palpitations, orthopnea, claudication, leg swelling and PND.  Gastrointestinal: Negative.  Negative for constipation and diarrhea.  Genitourinary: Negative.   Musculoskeletal: Negative for back pain, falls, joint pain, myalgias and neck pain.  Skin: Negative.   Neurological: Positive for headaches. Negative for dizziness, tingling, tremors, sensory change, speech change, focal weakness, seizures, loss of consciousness and weakness.  Endo/Heme/Allergies: Negative.   Psychiatric/Behavioral: Positive for depression. Negative for hallucinations, memory loss, substance abuse and suicidal ideas. The patient has insomnia. The patient is not nervous/anxious.     Family history- Review and unchanged Social history- Review and unchanged Physical Exam: BP 140/78   Pulse 63   Temp 97.7 F (36.5 C)   Resp  14   Ht 5\' 4"  (1.626 m)   Wt 148 lb 6.4 oz (67.3 kg)   SpO2 97%   BMI 25.47 kg/m  Wt Readings from Last 3 Encounters:  02/20/17 148 lb 6.4 oz (67.3 kg)  02/09/17 150 lb (68 kg)  10/06/16 147 lb 9.6 oz (67 kg)  Body mass index is 25.47 kg/m.  General Appearance: Well nourished, in no apparent distress. Eyes: PERRLA, EOMs, conjunctiva no swelling or erythema Sinuses: No Frontal/maxillary tenderness ENT/Mouth: Ext aud canals clear, TMs without erythema, bulging. No erythema, swelling, or exudate on post pharynx.  Tonsils not swollen or erythematous. Hearing normal.  Neck: Supple, thyroid normal.  Respiratory: Respiratory effort normal, BS equal bilaterally without rales, rhonchi, wheezing or stridor.  Cardio: RRR with no MRGs. Brisk peripheral pulses without edema.  Abdomen: Soft, + BS.  Non tender, no guarding, rebound, hernias, masses. Lymphatics: Non tender without lymphadenopathy.  Musculoskeletal: Full ROM, 5/5 strength, normal gait.  Skin: Warm, dry without rashes, lesions, ecchymosis.  Neuro: Cranial nerves intact. Normal muscle tone, no cerebellar symptoms. Sensation intact.  Psych: Awake and oriented X 3, normal affect, Insight and Judgment appropriate.    Vicie Mutters 10:45 AM

## 2017-02-20 ENCOUNTER — Ambulatory Visit (INDEPENDENT_AMBULATORY_CARE_PROVIDER_SITE_OTHER): Payer: Medicare Other | Admitting: Physician Assistant

## 2017-02-20 ENCOUNTER — Encounter: Payer: Self-pay | Admitting: Physician Assistant

## 2017-02-20 ENCOUNTER — Other Ambulatory Visit: Payer: Self-pay | Admitting: Physician Assistant

## 2017-02-20 VITALS — BP 140/78 | HR 63 | Temp 97.7°F | Resp 14 | Ht 64.0 in | Wt 148.4 lb

## 2017-02-20 DIAGNOSIS — G43909 Migraine, unspecified, not intractable, without status migrainosus: Secondary | ICD-10-CM | POA: Diagnosis not present

## 2017-02-20 DIAGNOSIS — R7309 Other abnormal glucose: Secondary | ICD-10-CM

## 2017-02-20 DIAGNOSIS — F3340 Major depressive disorder, recurrent, in remission, unspecified: Secondary | ICD-10-CM

## 2017-02-20 DIAGNOSIS — I10 Essential (primary) hypertension: Secondary | ICD-10-CM | POA: Diagnosis not present

## 2017-02-20 DIAGNOSIS — Z79899 Other long term (current) drug therapy: Secondary | ICD-10-CM

## 2017-02-20 DIAGNOSIS — E782 Mixed hyperlipidemia: Secondary | ICD-10-CM

## 2017-02-20 DIAGNOSIS — E559 Vitamin D deficiency, unspecified: Secondary | ICD-10-CM | POA: Diagnosis not present

## 2017-02-20 LAB — HEPATIC FUNCTION PANEL
ALBUMIN: 4.3 g/dL (ref 3.6–5.1)
ALK PHOS: 61 U/L (ref 33–130)
ALT: 16 U/L (ref 6–29)
AST: 19 U/L (ref 10–35)
BILIRUBIN TOTAL: 0.3 mg/dL (ref 0.2–1.2)
Bilirubin, Direct: 0.1 mg/dL (ref <=0.2)
Indirect Bilirubin: 0.2 mg/dL (ref 0.2–1.2)
Total Protein: 6.6 g/dL (ref 6.1–8.1)

## 2017-02-20 LAB — CBC WITH DIFFERENTIAL/PLATELET
BASOS PCT: 0 %
Basophils Absolute: 0 cells/uL (ref 0–200)
EOS ABS: 100 {cells}/uL (ref 15–500)
Eosinophils Relative: 1 %
HEMATOCRIT: 45.8 % — AB (ref 35.0–45.0)
Hemoglobin: 15.1 g/dL (ref 11.7–15.5)
LYMPHS ABS: 2500 {cells}/uL (ref 850–3900)
Lymphocytes Relative: 25 %
MCH: 30.5 pg (ref 27.0–33.0)
MCHC: 33 g/dL (ref 32.0–36.0)
MCV: 92.5 fL (ref 80.0–100.0)
MONO ABS: 700 {cells}/uL (ref 200–950)
MPV: 10 fL (ref 7.5–12.5)
Monocytes Relative: 7 %
NEUTROS ABS: 6700 {cells}/uL (ref 1500–7800)
Neutrophils Relative %: 67 %
PLATELETS: 337 10*3/uL (ref 140–400)
RBC: 4.95 MIL/uL (ref 3.80–5.10)
RDW: 13.4 % (ref 11.0–15.0)
WBC: 10 10*3/uL (ref 3.8–10.8)

## 2017-02-20 LAB — TSH: TSH: 2.25 mIU/L

## 2017-02-20 LAB — LIPID PANEL
CHOL/HDL RATIO: 2.6 ratio (ref <5.0)
Cholesterol: 152 mg/dL (ref <200)
HDL: 58 mg/dL (ref >50)
LDL CALC: 69 mg/dL (ref <100)
Triglycerides: 125 mg/dL (ref <150)
VLDL: 25 mg/dL (ref <30)

## 2017-02-20 LAB — BASIC METABOLIC PANEL WITH GFR
BUN: 13 mg/dL (ref 7–25)
CHLORIDE: 106 mmol/L (ref 98–110)
CO2: 24 mmol/L (ref 20–31)
Calcium: 9.2 mg/dL (ref 8.6–10.4)
Creat: 0.95 mg/dL — ABNORMAL HIGH (ref 0.60–0.93)
GFR, Est African American: 70 mL/min (ref >=60)
GFR, Est Non African American: 61 mL/min (ref >=60)
Glucose, Bld: 92 mg/dL (ref 65–99)
POTASSIUM: 4.2 mmol/L (ref 3.5–5.3)
Sodium: 143 mmol/L (ref 135–146)

## 2017-02-20 MED ORDER — CLOTRIMAZOLE-BETAMETHASONE 1-0.05 % EX CREA: 1.0000 "application " | TOPICAL_CREAM | Freq: Two times a day (BID) | CUTANEOUS | 2 refills | Status: DC

## 2017-02-20 MED ORDER — RIZATRIPTAN BENZOATE 10 MG PO TABS: ORAL_TABLET | ORAL | 0 refills | Status: DC

## 2017-02-20 MED ORDER — DULOXETINE HCL 30 MG PO CPEP: 30.0000 mg | ORAL_CAPSULE | Freq: Every day | ORAL | 2 refills | Status: DC

## 2017-02-20 MED ORDER — ESTRADIOL 0.5 MG PO TABS: 0.5000 mg | ORAL_TABLET | Freq: Every day | ORAL | 0 refills | Status: DC

## 2017-02-20 MED ORDER — TRAZODONE HCL 150 MG PO TABS: ORAL_TABLET | ORAL | 1 refills | Status: DC

## 2017-02-20 MED ORDER — PRAVASTATIN SODIUM 40 MG PO TABS: 40.0000 mg | ORAL_TABLET | Freq: Every day | ORAL | 1 refills | Status: DC

## 2017-02-20 MED ORDER — PROGESTERONE MICRONIZED 100 MG PO CAPS: ORAL_CAPSULE | ORAL | 1 refills | Status: DC

## 2017-02-20 NOTE — Patient Instructions (Signed)
What is the TMJ? The temporomandibular (tem-PUH-ro-man-DIB-yoo-ler) joint, or the TMJ, connects the upper and lower jawbones. This joint allows the jaw to open wide and move back and forth when you chew, talk, or yawn.There are also several muscles that help this joint move. There can be muscle tightness and pain in the muscle that can cause several symptoms.  What causes TMJ pain? There are many causes of TMJ pain. Repeated chewing (for example, chewing gum) and clenching your teeth can cause pain in the joint. Some TMJ pain has no obvious cause. What can I do to ease the pain? There are many things you can do to help your pain get better. When you have pain:  Eat soft foods and stay away from chewy foods (for example, taffy) Try to use both sides of your mouth to chew Don't chew gum Massage Don't open your mouth wide (for example, during yawning or singing) Don't bite your cheeks or fingernails Lower your amount of stress and worry Applying a warm, damp washcloth to the joint may help. Over-the-counter pain medicines such as ibuprofen (one brand: Advil) or acetaminophen (one brand: Tylenol) might also help. Do not use these medicines if you are allergic to them or if your doctor told you not to use them. How can I stop the pain from coming back? When your pain is better, you can do these exercises to make your muscles stronger and to keep the pain from coming back:  Resisted mouth opening: Place your thumb or two fingers under your chin and open your mouth slowly, pushing up lightly on your chin with your thumb. Hold for three to six seconds. Close your mouth slowly. Resisted mouth closing: Place your thumbs under your chin and your two index fingers on the ridge between your mouth and the bottom of your chin. Push down lightly on your chin as you close your mouth. Tongue up: Slowly open and close your mouth while keeping the tongue touching the roof of the mouth. Side-to-side jaw movement:  Place an object about one fourth of an inch thick (for example, two tongue depressors) between your front teeth. Slowly move your jaw from side to side. Increase the thickness of the object as the exercise becomes easier Forward jaw movement: Place an object about one fourth of an inch thick between your front teeth and move the bottom jaw forward so that the bottom teeth are in front of the top teeth. Increase the thickness of the object as the exercise becomes easier. These exercises should not be painful. If it hurts to do these exercises, stop doing them and talk to your family doctor.    To prevent migraines you can try these natural/OTC medications as prevention: 1) Melatonin 5mg -15mg  30 mins before bed 2) Riboflavin/B2 400mg  daily 3) CoQ10 100mg  3 x a day  We may also treat TMJ if we think you have it If you are having frequent migraines we may put you on a once a day medication with fast acting medication to take. Here is more information below  Please remember, common headache triggers are: sleep deprivation, dehydration, overheating, stress, hypoglycemia or skipping meals and blood sugar fluctuations, excessive pain medications or excessive alcohol use or caffeine withdrawal. Some people have food triggers such as aged cheese, orange juice or chocolate, especially dark chocolate, or MSG (monosodium glutamate). Try to avoid these headache triggers as much possible. It may be helpful to keep a headache diary to figure out what makes your headaches worse  or brings them on and what alleviates them. Some people report headache onset after exercise but studies have shown that regular exercise may actually prevent headaches from coming. If you have exercise-induced headaches, please make sure that you drink plenty of fluid before and after exercising and that you do not over do it and do not overheat.    Migraine Headache A migraine headache is an intense, throbbing pain on one or both sides  of your head. Recurrent migraines keep coming back. A migraine can last for 30 minutes to several hours. CAUSES  The exact cause of a migraine headache is not always known. However, a migraine may be caused when nerves in the brain become irritated and release chemicals that cause inflammation. This causes pain. Certain things may also trigger migraines, such as:   Alcohol.  Smoking.  Stress.  Menstruation.  Aged cheeses.  Foods or drinks that contain nitrates, glutamate, aspartame, or tyramine.  Lack of sleep.  Chocolate.  Caffeine.  Hunger.  Physical exertion.  Fatigue.  Medicines used to treat chest pain (nitroglycerine), birth control pills, estrogen, and some blood pressure medicines. SYMPTOMS   Pain on one or both sides of your head.  Pulsating or throbbing pain.  Severe pain that prevents daily activities.  Pain that is aggravated by any physical activity.  Nausea, vomiting, or both.  Dizziness.  Pain with exposure to bright lights, loud noises, or activity.  General sensitivity to bright lights, loud noises, or smells. Before you get a migraine, you may get warning signs that a migraine is coming (aura). An aura may include:  Seeing flashing lights.  Seeing bright spots, halos, or zigzag lines.  Having tunnel vision or blurred vision.  Having feelings of numbness or tingling.  Having trouble talking.  Having muscle weakness. DIAGNOSIS  A recurrent migraine headache is often diagnosed based on:  Symptoms.  Physical examination.  A CT scan or MRI of your head. These imaging tests cannot diagnose migraines but can help rule out other causes of headaches.  TREATMENT  Medicines may be given for pain and nausea. Medicines can also be given to help prevent recurrent migraines. HOME CARE INSTRUCTIONS  Only take over-the-counter or prescription medicines for pain or discomfort as directed by your health care provider. The use of long-term  narcotics is not recommended.  Lie down in a dark, quiet room when you have a migraine.  Keep a journal to find out what may trigger your migraine headaches. For example, write down:  What you eat and drink.  How much sleep you get.  Any change to your diet or medicines.  Limit alcohol consumption.  Quit smoking if you smoke.  Get 7-9 hours of sleep, or as recommended by your health care provider.  Limit stress.  Keep lights dim if bright lights bother you and make your migraines worse. SEEK MEDICAL CARE IF:   You do not get relief from the medicines given to you.  You have a recurrence of pain.  You have a fever. SEEK IMMEDIATE MEDICAL CARE IF:  Your migraine becomes severe.  You have a stiff neck.  You have loss of vision.  You have muscular weakness or loss of muscle control.  You start losing your balance or have trouble walking.  You feel faint or pass out. You have severe symptoms that are different from your first symptoms. MAKE SURE YOU:   Understand these instructions.  Will watch your condition.  Will get help right away if you  are not doing well or get worse.   This information is not intended to replace advice given to you by your health care provider. Make sure you discuss any questions you have with your health care provider.   Document Released: 04/26/2001 Document Revised: 08/22/2014 Document Reviewed: 04/08/2013 Elsevier Interactive Patient Education 2016 [employer].  Common Migraine Triggers   Foods Aged cheese, alcohol, nuts, chocolate, yogurt, onions, figs, liver, caffeinated foods and beverages, monosodium glutamate (MSG), smoked or pickled fish/meat, nitrate/nitrate preserved foods (hotdogs, pepperoni, salami) tyramine  Medications Antibiotics (tetracycline, griseofulvin), antihypertensives (nifedipine, captopril), hormones (oral contraceptives, estrogens), histamine-2 blockers (cimetidine, raniidine, vasodilators (nitroglycerine,  isosorbide dinitrate)  Sensory Stimuli Flickering/bright/fluorescent lights, bright sunlight, odors (perfume, chemicals, cigarette smoke)  Lifestyle Changes Time zones, sleep patterns, eating habits, caffeine withdrawal stress  Other Menstrual cycle, weather/season/air pressure changes, high altitude  Adapted from [place] [facility] [place], [place]. [facility] [place]; [place] and [place]. Conquering Headache, 1998  Hormonal variations also are believed to play a part.  Fluctuations of the female hormone estrogen (such as just before menstruation) affect a chemical called serotonin-when serotonin levels in the brain fall, the dilation (expansion) of blood vessels in the brain that is characteristic of migraine often follows.  Many factors or "triggers" can start a migraine.  In people who get migraines, most experts think certain activities or foods may trigger temporary changes in the blood vessels around the brain.  Swelling of these blood vessels may cause pain in the nearby nerves.  Allergy Headaches:  Hotdogs Milk  Onions  Thyme Bacon  Chocolate Garlic  Nutmeg Ham  Dark Cola Pork  Cinnamon Salami  Nuts  Egg  Ginger Sausage Red wine Cloves  Cheddar Cheese Caffeine

## 2017-02-21 LAB — MAGNESIUM: MAGNESIUM: 2.2 mg/dL (ref 1.5–2.5)

## 2017-02-24 ENCOUNTER — Other Ambulatory Visit (INDEPENDENT_AMBULATORY_CARE_PROVIDER_SITE_OTHER): Payer: Self-pay | Admitting: Orthopedic Surgery

## 2017-02-24 ENCOUNTER — Ambulatory Visit
Admission: RE | Admit: 2017-02-24 | Discharge: 2017-02-24 | Disposition: A | Discharge status: Home / Self Care | Payer: Medicare Other | Source: Ambulatory Visit | Attending: Orthopedic Surgery | Admitting: Orthopedic Surgery

## 2017-02-24 DIAGNOSIS — M5417 Radiculopathy, lumbosacral region: Secondary | ICD-10-CM

## 2017-02-24 DIAGNOSIS — M47817 Spondylosis without myelopathy or radiculopathy, lumbosacral region: Secondary | ICD-10-CM | POA: Diagnosis not present

## 2017-02-24 MED ORDER — METHYLPREDNISOLONE ACETATE 40 MG/ML INJ SUSP (RADIOLOG
120.0000 mg | Freq: Once | INTRAMUSCULAR | Status: AC
  Administered 2017-02-24: 120 mg via EPIDURAL

## 2017-02-24 MED ORDER — IOPAMIDOL (ISOVUE-M 200) INJECTION 41%
1.0000 mL | Freq: Once | INTRAMUSCULAR | Status: AC
  Administered 2017-02-24: 1 mL via EPIDURAL

## 2017-02-24 NOTE — Discharge Instructions (Signed)

## 2017-04-11 ENCOUNTER — Other Ambulatory Visit: Payer: Self-pay | Admitting: Physician Assistant

## 2017-05-19 ENCOUNTER — Other Ambulatory Visit: Payer: Self-pay | Admitting: Physician Assistant

## 2017-06-16 ENCOUNTER — Ambulatory Visit (INDEPENDENT_AMBULATORY_CARE_PROVIDER_SITE_OTHER): Payer: Medicare Other

## 2017-06-16 DIAGNOSIS — Z23 Encounter for immunization: Secondary | ICD-10-CM

## 2017-06-16 NOTE — Progress Notes (Signed)
Patient presents to the office for annual Flu vaccine. Vaccination given in left deltoid without any complications.

## 2017-06-24 ENCOUNTER — Other Ambulatory Visit: Payer: Self-pay | Admitting: Physician Assistant

## 2017-06-26 ENCOUNTER — Other Ambulatory Visit: Payer: Self-pay | Admitting: Internal Medicine

## 2017-07-21 ENCOUNTER — Other Ambulatory Visit: Payer: Self-pay | Admitting: Internal Medicine

## 2017-07-21 DIAGNOSIS — F411 Generalized anxiety disorder: Secondary | ICD-10-CM

## 2017-07-21 MED ORDER — ALPRAZOLAM 0.25 MG PO TABS: ORAL_TABLET | ORAL | 0 refills | Status: DC

## 2017-07-24 ENCOUNTER — Encounter: Payer: Self-pay | Admitting: Physician Assistant

## 2017-08-30 ENCOUNTER — Ambulatory Visit (INDEPENDENT_AMBULATORY_CARE_PROVIDER_SITE_OTHER): Payer: Medicare Other

## 2017-08-30 ENCOUNTER — Ambulatory Visit (INDEPENDENT_AMBULATORY_CARE_PROVIDER_SITE_OTHER): Payer: Medicare Other | Admitting: Orthopedic Surgery

## 2017-08-30 ENCOUNTER — Encounter (INDEPENDENT_AMBULATORY_CARE_PROVIDER_SITE_OTHER): Payer: Self-pay | Admitting: Orthopedic Surgery

## 2017-08-30 VITALS — BP 148/80 | HR 66 | Resp 16 | Ht 63.0 in | Wt 145.0 lb

## 2017-08-30 DIAGNOSIS — M5442 Lumbago with sciatica, left side: Secondary | ICD-10-CM

## 2017-08-30 DIAGNOSIS — M5416 Radiculopathy, lumbar region: Secondary | ICD-10-CM

## 2017-08-30 DIAGNOSIS — G8929 Other chronic pain: Secondary | ICD-10-CM

## 2017-08-30 MED ORDER — PREDNISONE 1 MG PO TABS: 1.0000 mg | ORAL_TABLET | ORAL | 0 refills | Status: DC

## 2017-08-30 NOTE — Progress Notes (Signed)
Office Visit Note   Patient: Rebekah Hamilton           Date of Birth: [birth year]           MRN: [id number] Visit Date: 08/30/2017              Requested by: Unk Pinto, [address], [place] [address] PCP: Unk Pinto, MD   Assessment & Plan: Visit Diagnoses:  1. Chronic left-sided low back pain with left-sided sciatica   2. Lumbar radiculopathy     Plan:  #1: Prescription for prednisone was given which she will titrate off over the next 6 days. #2: If this is not beneficial than I think she should proceed with MRI scanning. She give [place] a call near future for that. #3: She has just gotten back to work after retirement and this is certainly exacerbating her symptoms  Follow-Up Instructions: Return if symptoms worsen or fail to improve.   Orders:  Orders Placed This Encounter  Procedures  . XR Lumbar Spine 2-3 Views   Meds ordered this encounter  Medications  . predniSONE (DELTASONE) 1 MG tablet    Sig: Take 1 tablet (1 mg total) by mouth as directed.    Dispense:  20 tablet    Refill:  0    Order Specific Question:   Supervising Provider    Answer:   Garald Balding [[id number]]      Procedures: No procedures performed   Clinical Data: No additional findings.   Subjective: Chief Complaint  Patient presents with  . Lower Back - Pain, Numbness  . Back Pain    Back pain x 1 month, radiates on left side down to knee, feels like knee is asleep only in the morning, tingling, limping at times, no injury, no back surgery, not diabetic, Excedrin doesn't help    HPI  Rebekah Hamilton is a very pleasant 72 year old white female who returns today for evaluation. She has had the lower lumbar spine pain for about one month. She now states it is starting to radiate from the left buttock down along the lateral thigh and traverses over the anterior portion of the patella. She has tingling especially early in the morning. She'll limp at times. She does  not have any symptoms below the patella. She feels some diffuse weakness in the left leg but that is not buckling. Denies any bowel or bladder incontinence. Seen today for evaluation.  Review of Systems  Constitutional: Positive for activity change.  HENT: Negative for trouble swallowing.   Eyes: Negative for pain.  Respiratory: Negative for shortness of breath.   Cardiovascular: Positive for leg swelling.  Gastrointestinal: Negative for constipation.  Endocrine: Negative for cold intolerance.  Genitourinary: Negative for difficulty urinating.  Musculoskeletal: Positive for back pain and joint swelling.  Skin: Negative for rash.  Allergic/Immunologic: Negative for food allergies.  Neurological: Negative for weakness and numbness.  Hematological: Bruises/bleeds easily.  Psychiatric/Behavioral: Negative for sleep disturbance.     Objective: Vital Signs: BP (!) 148/80 (BP Location: Left Arm, Patient Position: Sitting, Cuff Size: Normal)   Pulse 66   Resp 16   Ht 5\' 3"  (1.6 m)   Wt 145 lb (65.8 kg)   BMI 25.69 kg/m   Physical Exam  Constitutional: She is oriented to person, place, and time. She appears well-developed and well-nourished.  HENT:  Head: Normocephalic and atraumatic.  Eyes: EOM are normal. Pupils are equal, round, and reactive to light.  Pulmonary/Chest: Effort normal.  Neurological: She is alert and oriented to person, place, and time.  Skin: Skin is warm and dry.  Psychiatric: She has a normal mood and affect. Her behavior is normal. Judgment and thought content normal.    Ortho Exam  Today she has absence of the left patellar tendon reflex. [place] on the right and the patella as well as bilaterally 1+ in the Achilles. She has good strength in the lower extremities. Positive straight leg raise on the left at 90. Negative on the right. Sensation is intact in the calf bilaterally. 1+ posterior tib bilaterally. Smooth motion of both hips without crepitance or pain. Some  tenderness over the lumbar spine to palpation.  Specialty Comments:  No specialty comments available.  Imaging: No results found.   PMFS History: Patient Active Problem List   Diagnosis Date Noted  . Encounter for Medicare annual wellness exam 06/02/2015  . Glaucoma 06/02/2015  . Vitamin D deficiency 10/21/2014  . Medication management 12/01/2013  . Hyperlipidemia   . Hypertension   . Other abnormal glucose   . Migraines   . Allergy   . Anemia   . Anxiety   . Depression, major, recurrent, in remission ([place])   . Lumbar stenosis    Past Medical History:  Diagnosis Date  . Allergy   . Anemia   . Anxiety   . Depression   . Hyperlipidemia   . Hypertension   . Lumbar stenosis   . Migraines   . Prediabetes     Family History  Problem Relation Age of Onset  . Stroke Mother   . Hypertension Mother   . Heart attack Father     Past Surgical History:  Procedure Laterality Date  . BREAST LUMPECTOMY    . CHOLECYSTECTOMY  1999   Social History   Occupational History  . Not on file  Tobacco Use  . Smoking status: Never Smoker  . Smokeless tobacco: Never Used  Substance and Sexual Activity  . Alcohol use: Yes    Alcohol/week: 1.2 oz    Types: 2 Standard drinks or equivalent per week    Comment: Rare-Wine  . Drug use: No  . Sexual activity: Not on file

## 2017-09-08 ENCOUNTER — Other Ambulatory Visit: Payer: Self-pay | Admitting: Internal Medicine

## 2017-09-08 MED ORDER — AZITHROMYCIN 250 MG PO TABS: ORAL_TABLET | ORAL | 0 refills | Status: DC

## 2017-09-08 MED ORDER — PROMETHAZINE-DM 6.25-15 MG/5ML PO SYRP: ORAL_SOLUTION | ORAL | 0 refills | Status: DC

## 2017-09-08 MED ORDER — PREDNISONE 20 MG PO TABS: ORAL_TABLET | ORAL | 0 refills | Status: DC

## 2017-09-24 DIAGNOSIS — N182 Chronic kidney disease, stage 2 (mild): Secondary | ICD-10-CM | POA: Insufficient documentation

## 2017-09-24 NOTE — Progress Notes (Signed)
MEDICARE ANNUAL WELLNESS VISIT AND FOLLOW UP  Assessment:   Diagnoses and all orders for this visit:  Encounter for Medicare annual wellness exam  Essential hypertension At goal; continue medication Monitor blood pressure at home; call if consistently over 130/80 Continue DASH diet.   Reminder to go to the ER if any CP, SOB, nausea, dizziness, severe HA, changes vision/speech, left arm numbness and tingling and jaw pain.  Migraine without aura and without status migrainosus, not intractable Improved from previous - has mild HA once a week but does not progress to full migraines; continue medications Follow up neuro - [facility]  Mixed hyperlipidemia At goal; continue medications Continue low cholesterol diet and exercise.  Check lipid panel.  -     Lipid panel -     TSH  Other abnormal glucose Last A1Cs at goal but hasn't been checked in 6 months with poor compliance with follow up - Discussed disease and risks Discussed diet/exercise, weight management  -     Hemoglobin A1c  Allergic state, sequela Continue allergy medications as needed; rotate agent q 6 months, hygiene discussed  Depression, major, recurrent, in remission ([place]) with anxiety Per therapist will increase celexa to 60 mg  Lifestyle discussed: diet/exerise, sleep hygiene, stress management, hydration  Spinal stenosis of lumbar region, unspecified whether neurogenic claudication present Continue follow up with ortho as recommended Will follow up for steroid injections  Medication management -     CBC with Differential/Platelet -     BASIC METABOLIC PANEL WITH GFR -     Hepatic function panel  Vitamin D deficiency Continue supplementation for goal of 70-100 Check vitamin D level -     VITAMIN D 25 Hydroxy (Vit-D Deficiency, Fractures)  Glaucoma, unspecified glaucoma type, unspecified laterality Very mild - currently under observation Continue follow up with ophthalmology  CKD (chronic kidney  disease), symptom management only, stage 2 (mild) Increase fluids, avoid NSAIDS, monitor sugars, will monitor -     BASIC METABOLIC PANEL WITH GFR  Colon cancer screening Colonoscopy- patient declines a colonoscopy even though the risks and benefits were discussed at length. Colon cancer is 3rd most diagnosed cancer and 2nd leading cause of death in both men and women 10 years of age and older. Patient understands the risk of cancer and death with declining the test however they are willing to do cologuard screening instead. They understand that this is not as sensitive or specific as a colonoscopy and they are still recommended to get a colonoscopy. The cologuard will be sent out to their house.    Over 40 minutes of exam, counseling, chart review and critical decision making was performed No future appointments.   Plan:   During the course of the visit the patient was educated and counseled about appropriate screening and preventive services including:    Pneumococcal vaccine   Prevnar 13  Influenza vaccine  Td vaccine  Screening electrocardiogram  Bone densitometry screening  Colorectal cancer screening  Diabetes screening  Glaucoma screening  Nutrition counseling   Advanced directives: requested   Subjective:  Rebekah Hamilton is a 72 y.o. female who presents for Medicare Annual Wellness Visit and 3 month follow up. Dr. Charlene Brooke Conelius for counselling. Was recommended to increase celexa to 60 mg.  Followed by [facility] annually for estrogen/progesterone supplementation - currently tapering dow.   she has a diagnosis of major depression with anxiety and is currently on cymbalta 30 mg daily, reports symptoms are not well controlled on current  regimen. she is also prescribed trazodone for sleep.   BMI is Body mass index is 26.22 kg/m., she has been working on diet and exercise. Wt Readings from Last 3 Encounters:  09/25/17 148 lb (67.1 kg)  08/30/17 145 lb  (65.8 kg)  02/20/17 148 lb 6.4 oz (67.3 kg)    Her blood pressure has been controlled at home, today their BP is BP: 140/74 She does not workout. She denies chest pain, shortness of breath, dizziness.   She is on cholesterol medication and denies myalgias. Her cholesterol is at goal. The cholesterol last visit was:   Lab Results  Component Value Date   CHOL 152 02/20/2017   HDL 58 02/20/2017   LDLCALC 69 02/20/2017   TRIG 125 02/20/2017   CHOLHDL 2.6 02/20/2017    She has been working on diet and exercise for glucose management, and denies increased appetite, nausea, paresthesia of the feet, polydipsia, polyuria, visual disturbances and vomiting. Last A1C in the office was:  Lab Results  Component Value Date   HGBA1C 5.2 10/06/2016   Last GFR demonstrates stage 2 CKD: Lab Results  Component Value Date   GFRNONAA 61 02/20/2017   Patient is on Vitamin D supplement but well below goal of 70:    Lab Results  Component Value Date   VD25OH 38 10/06/2016      Medication Review: Current Outpatient Medications on File Prior to Visit  Medication Sig Dispense Refill  . Ascorbic Acid (VITAMIN C PO) 500 mg 2 (two) times daily.    Marland Kitchen aspirin 325 MG tablet Take 325 mg by mouth daily.    . Cholecalciferol (VITAMIN D PO) Take by mouth. Takes 10000 to 12000 units daily.    . clotrimazole-betamethasone (LOTRISONE) cream Apply 1 application topically 2 (two) times daily. (Patient taking differently: Apply 1 application topically as needed. ) 15 g 2  . fexofenadine (ALLEGRA) 180 MG tablet Take 180 mg by mouth daily.    . rizatriptan (MAXALT) 10 MG tablet TAKE 1 TABLET BY MOUTH AS NEEDED FOR MIGRAINE, MAY REPEAT DOSE IN 2 HOURS IF NEEDED 12 tablet 3   No current facility-administered medications on file prior to visit.     Allergies  Allergen Reactions  . Augmentin [Amoxicillin-Pot Clavulanate] Diarrhea  . Prozac [Fluoxetine Hcl] Other (See Comments)    Disoriented  . Wellbutrin [Bupropion]  Other (See Comments)    Insomnia/Nervousness    Current Problems (verified) Patient Active Problem List   Diagnosis Date Noted  . CKD (chronic kidney disease), symptom management only, stage 2 (mild) 09/24/2017  . Encounter for Medicare annual wellness exam 06/02/2015  . Glaucoma 06/02/2015  . Vitamin D deficiency 10/21/2014  . Medication management 12/01/2013  . Hyperlipidemia   . Hypertension   . Other abnormal glucose   . Migraines   . Allergy   . Anxiety   . Depression, major, recurrent, in remission ([place])   . Lumbar stenosis     Screening Tests Immunization History  Administered Date(s) Administered  . Influenza, High Dose Seasonal PF 05/29/2014, 06/02/2015, 06/16/2017  . Influenza,inj,quad, With Preservative 08/12/2013, 07/04/2016  . Pneumococcal Conjugate-13 04/07/2015  . Pneumococcal Polysaccharide-23 05/29/2013  . Td 08/15/2005, 07/04/2016   Prior vaccinations: TD or Tdap: 2017  Influenza: 2018 Pneumococcal: 2014 Prevnar13: 2016 Shingles/Zostavax: 2016  Preventative care: Last colonoscopy: 2006 - wants to do cologuard Last mammogram: 10/2015 DUE is getting through OBGYN Last pap smear/pelvic exam: 2014 negative [place] AB 2008 MRI lumbar 01/2017 CXR 2008 DEXA: 2000, declines  Names of Other Physician/Practitioners you currently use: 1. [facility] here for primary care 2. [facility], eye doctor, last visit 2017 - is getting new provider - Dr. Herbert Deaner and will follow up 3. Dr. Evelene Croon, dentist, last visit 09/2017   Patient Care Team: Unk Pinto, MD as PCP - General (Internal Medicine) Garald Balding, MD as Consulting Physician (Orthopedic Surgery) Laurence Spates, MD as Consulting Physician (Gastroenterology) Avon Gully, NP as Nurse Practitioner (Obstetrics and Gynecology)  SURGICAL HISTORY She  has a past surgical history that includes Cholecystectomy (1999) and Breast lumpectomy. FAMILY  HISTORY Her family history includes Heart attack in her father; Hypertension in her mother; Stroke in her mother. SOCIAL HISTORY She  reports that  has never smoked. she has never used smokeless tobacco. She reports that she drinks about 1.2 oz of alcohol per week. She reports that she does not use drugs.   MEDICARE WELLNESS OBJECTIVES: Physical activity: Current Exercise Habits: The patient does not participate in regular exercise at present, Exercise limited by: orthopedic condition(s) Cardiac risk factors: Cardiac Risk Factors include: advanced age (>25men, >64 women);dyslipidemia;hypertension;sedentary lifestyle Depression/mood screen:   Depression screen [facility] 2/9 09/25/2017  Decreased Interest 2  Down, Depressed, Hopeless 3  PHQ - 2 Score 5  Altered sleeping 1  Tired, decreased energy 1  Change in appetite 2  Feeling bad or failure about yourself  2  Trouble concentrating 1  Moving slowly or fidgety/restless 1  Suicidal thoughts 0  PHQ-9 Score 13  Difficult doing work/chores Somewhat difficult    ADLs:  In your present state of health, do you have any difficulty performing the following activities: 09/25/2017 10/07/2016  Hearing? Y N  Vision? N N  Difficulty concentrating or making decisions? N N  Walking or climbing stairs? N N  Dressing or bathing? N N  Doing errands, shopping? N N  Some recent data might be hidden     Cognitive Testing  Alert? Yes  Normal Appearance?Yes  Oriented to person? Yes  Place? Yes   Time? Yes  Recall of three objects?  Yes  Can perform simple calculations? Yes  Displays appropriate judgment?Yes  Can read the correct time from a watch face?Yes  EOL planning: Does Patient Have a Medical Advance Directive?: Yes Type of Advance Directive: Healthcare Power of Attorney, Living will Does patient want to make changes to medical advance directive?: Yes (MAU/Ambulatory/Procedural Areas - Information given) Copy of [place] in  Chart?: No - copy requested  Review of Systems  Constitutional: Negative for malaise/fatigue and weight loss.  HENT: Negative for hearing loss and tinnitus.   Eyes: Negative for blurred vision and double vision.  Respiratory: Negative for cough, shortness of breath and wheezing.   Cardiovascular: Negative for chest pain, palpitations, orthopnea, claudication and leg swelling.  Gastrointestinal: Negative for abdominal pain, blood in stool, constipation, diarrhea, heartburn, melena, nausea and vomiting.  Genitourinary: Negative.   Musculoskeletal: Positive for falls (single mechanical fall ). Negative for joint pain and myalgias.  Skin: Negative for rash.  Neurological: Negative for dizziness, tingling, sensory change, weakness and headaches.  Endo/Heme/Allergies: Negative for polydipsia.  Psychiatric/Behavioral: Positive for depression. Negative for memory loss, substance abuse and suicidal ideas. The patient is not nervous/anxious and does not have insomnia.   All other systems reviewed and are negative.    Objective:     Today's Vitals   09/25/17 1501  BP: 140/74  Pulse: 69  Temp: 97.9 F (36.6 C)  SpO2: 98%  Weight: 148 lb (67.1 kg)  Height: 5\' 3"  (1.6 m)   Body mass index is 26.22 kg/m.  General appearance: alert, no distress, WD/WN, female HEENT: normocephalic, sclerae anicteric, TMs pearly, nares patent, no discharge or erythema, pharynx normal Oral cavity: MMM, no lesions Neck: supple, no lymphadenopathy, no thyromegaly, no masses Heart: RRR, normal S1, S2, no murmurs Lungs: CTA bilaterally, no wheezes, rhonchi, or rales Abdomen: +bs, soft, non tender, non distended, no masses, no hepatomegaly, no splenomegaly Musculoskeletal: nontender, no swelling, no obvious deformity Extremities: no edema, no cyanosis, no clubbing Pulses: 2+ symmetric, upper and lower extremities, normal cap refill Neurological: alert, oriented x 3, CN2-12 intact, strength normal upper  extremities and lower extremities, sensation normal throughout, DTRs 2+ throughout, no cerebellar signs, gait normal Psychiatric: normal affect, behavior normal, pleasant   Medicare Attestation I have personally reviewed: The patient's medical and social history Their use of alcohol, tobacco or illicit drugs Their current medications and supplements The patient's functional ability including ADLs,fall risks, home safety risks, cognitive, and hearing and visual impairment Diet and physical activities Evidence for depression or mood disorders  The patient's weight, height, BMI, and visual acuity have been recorded in the chart.  I have made referrals, counseling, and provided education to the patient based on review of the above and I have provided the patient with a written personalized care plan for preventive services.     Izora Ribas, NP   09/25/2017

## 2017-09-25 ENCOUNTER — Encounter: Payer: Self-pay | Admitting: Adult Health

## 2017-09-25 ENCOUNTER — Ambulatory Visit (INDEPENDENT_AMBULATORY_CARE_PROVIDER_SITE_OTHER): Payer: Medicare Other | Admitting: Adult Health

## 2017-09-25 VITALS — BP 140/74 | HR 69 | Temp 97.9°F | Ht 63.0 in | Wt 148.0 lb

## 2017-09-25 DIAGNOSIS — R7309 Other abnormal glucose: Secondary | ICD-10-CM

## 2017-09-25 DIAGNOSIS — R6889 Other general symptoms and signs: Secondary | ICD-10-CM

## 2017-09-25 DIAGNOSIS — Z0001 Encounter for general adult medical examination with abnormal findings: Secondary | ICD-10-CM | POA: Diagnosis not present

## 2017-09-25 DIAGNOSIS — F3340 Major depressive disorder, recurrent, in remission, unspecified: Secondary | ICD-10-CM | POA: Diagnosis not present

## 2017-09-25 DIAGNOSIS — Z0000 Encounter for general adult medical examination without abnormal findings: Secondary | ICD-10-CM

## 2017-09-25 DIAGNOSIS — N182 Chronic kidney disease, stage 2 (mild): Secondary | ICD-10-CM | POA: Diagnosis not present

## 2017-09-25 DIAGNOSIS — H409 Unspecified glaucoma: Secondary | ICD-10-CM | POA: Diagnosis not present

## 2017-09-25 DIAGNOSIS — G43009 Migraine without aura, not intractable, without status migrainosus: Secondary | ICD-10-CM

## 2017-09-25 DIAGNOSIS — M48061 Spinal stenosis, lumbar region without neurogenic claudication: Secondary | ICD-10-CM | POA: Diagnosis not present

## 2017-09-25 DIAGNOSIS — E782 Mixed hyperlipidemia: Secondary | ICD-10-CM | POA: Diagnosis not present

## 2017-09-25 DIAGNOSIS — E559 Vitamin D deficiency, unspecified: Secondary | ICD-10-CM | POA: Diagnosis not present

## 2017-09-25 DIAGNOSIS — I10 Essential (primary) hypertension: Secondary | ICD-10-CM

## 2017-09-25 DIAGNOSIS — T7840XS Allergy, unspecified, sequela: Secondary | ICD-10-CM

## 2017-09-25 DIAGNOSIS — Z79899 Other long term (current) drug therapy: Secondary | ICD-10-CM

## 2017-09-25 DIAGNOSIS — Z1211 Encounter for screening for malignant neoplasm of colon: Secondary | ICD-10-CM

## 2017-09-25 DIAGNOSIS — F419 Anxiety disorder, unspecified: Secondary | ICD-10-CM | POA: Diagnosis not present

## 2017-09-25 MED ORDER — DULOXETINE HCL 60 MG PO CPEP: 60.0000 mg | ORAL_CAPSULE | Freq: Every day | ORAL | 0 refills | Status: DC

## 2017-09-25 MED ORDER — PRAVASTATIN SODIUM 40 MG PO TABS: 40.0000 mg | ORAL_TABLET | Freq: Every day | ORAL | 1 refills | Status: DC

## 2017-09-25 MED ORDER — PROGESTERONE MICRONIZED 100 MG PO CAPS
ORAL_CAPSULE | ORAL | 1 refills | Status: DC
Start: 2017-09-25 — End: 2018-05-06

## 2017-09-25 MED ORDER — ESTRADIOL 0.5 MG PO TABS: 0.5000 mg | ORAL_TABLET | Freq: Every day | ORAL | 0 refills | Status: DC

## 2017-09-25 NOTE — Patient Instructions (Signed)
Aim for 7+ servings of fruits and vegetables daily  80+ fluid ounces of water or unsweet tea for healthy kidneys  Limit alcohol to 1 drink per day  Limit animal fats in diet for cholesterol and heart health - choose grass fed whenever available  Aim for low stress - take time to unwind and care for your mental health  Aim for 150 min of moderate intensity exercise weekly for heart health, and weights twice weekly for bone health  Aim for 7-9 hours of sleep daily     Stress and Stress Management Stress is a normal reaction to life events. It is what you feel when life demands more than you are used to or more than you can handle. Some stress can be useful. For example, the stress reaction can help you catch the last bus of the day, study for a test, or meet a deadline at work. But stress that occurs too often or for too long can cause problems. It can affect your emotional health and interfere with relationships and normal daily activities. Too much stress can weaken your immune system and increase your risk for physical illness. If you already have a medical problem, stress can make it worse. What are the causes? All sorts of life events may cause stress. An event that causes stress for one person may not be stressful for another person. Major life events commonly cause stress. These may be positive or negative. Examples include losing your job, moving into a new home, getting married, having a baby, or losing a loved one. Less obvious life events may also cause stress, especially if they occur day after day or in combination. Examples include working long hours, driving in traffic, caring for children, being in debt, or being in a difficult relationship. What are the signs or symptoms? Stress may cause emotional symptoms including, the following:  Anxiety. This is feeling worried, afraid, on edge, overwhelmed, or out of control.  Anger. This is feeling irritated or  impatient.  Depression. This is feeling sad, down, helpless, or guilty.  Difficulty focusing, remembering, or making decisions.  Stress may cause physical symptoms, including the following:  Aches and pains. These may affect your head, neck, back, stomach, or other areas of your body.  Tight muscles or clenched jaw.  Low energy or trouble sleeping.  Stress may cause unhealthy behaviors, including the following:  Eating to feel better (overeating) or skipping meals.  Sleeping too little, too much, or both.  Working too much or putting off tasks (procrastination).  Smoking, drinking alcohol, or using drugs to feel better.  How is this diagnosed? Stress is diagnosed through an assessment by your health care provider. Your health care provider will ask questions about your symptoms and any stressful life events.Your health care provider will also ask about your medical history and may order blood tests or other tests. Certain medical conditions and medicine can cause physical symptoms similar to stress. Mental illness can cause emotional symptoms and unhealthy behaviors similar to stress. Your health care provider may refer you to a mental health professional for further evaluation. How is this treated? Stress management is the recommended treatment for stress.The goals of stress management are reducing stressful life events and coping with stress in healthy ways. Techniques for reducing stressful life events include the following:  Stress identification. Self-monitor for stress and identify what causes stress for you. These skills may help you to avoid some stressful events.  Time management. Set your priorities, keep  a calendar of events, and learn to say "no." These tools can help you avoid making too many commitments.  Techniques for coping with stress include the following:  Rethinking the problem. Try to think realistically about stressful events rather than ignoring them or  overreacting. Try to find the positives in a stressful situation rather than focusing on the negatives.  Exercise. Physical exercise can release both physical and emotional tension. The key is to find a form of exercise you enjoy and do it regularly.  Relaxation techniques. These relax the body and mind. Examples include yoga, meditation, tai chi, biofeedback, deep breathing, progressive muscle relaxation, listening to music, being out in nature, journaling, and other hobbies. Again, the key is to find one or more that you enjoy and can do regularly.  Healthy lifestyle. Eat a balanced diet, get plenty of sleep, and do not smoke. Avoid using alcohol or drugs to relax.  Strong support network. Spend time with family, friends, or other people you enjoy being around.Express your feelings and talk things over with someone you trust.  Counseling or talktherapy with a mental health professional may be helpful if you are having difficulty managing stress on your own. Medicine is typically not recommended for the treatment of stress.Talk to your health care provider if you think you need medicine for symptoms of stress. Follow these instructions at home:  Keep all follow-up visits as directed by your health care provider.  Take all medicines as directed by your health care provider. Contact a health care provider if:  Your symptoms get worse or you start having new symptoms.  You feel overwhelmed by your problems and can no longer manage them on your own. Get help right away if:  You feel like hurting yourself or someone else. This information is not intended to replace advice given to you by your health care provider. Make sure you discuss any questions you have with your health care provider. Document Released: 01/25/2001 Document Revised: 01/07/2016 Document Reviewed: 03/26/2013 Elsevier Interactive Patient Education  2017 [employer].

## 2017-09-26 LAB — CBC WITH DIFFERENTIAL/PLATELET
BASOS PCT: 0.5 %
Basophils Absolute: 49 cells/uL (ref 0–200)
EOS ABS: 196 {cells}/uL (ref 15–500)
Eosinophils Relative: 2 %
HCT: 43.1 % (ref 35.0–45.0)
HEMOGLOBIN: 14.5 g/dL (ref 11.7–15.5)
Lymphs Abs: 2705 cells/uL (ref 850–3900)
MCH: 29.9 pg (ref 27.0–33.0)
MCHC: 33.6 g/dL (ref 32.0–36.0)
MCV: 88.9 fL (ref 80.0–100.0)
MPV: 10.6 fL (ref 7.5–12.5)
Monocytes Relative: 7.7 %
NEUTROS ABS: 6096 {cells}/uL (ref 1500–7800)
Neutrophils Relative %: 62.2 %
Platelets: 302 10*3/uL (ref 140–400)
RBC: 4.85 10*6/uL (ref 3.80–5.10)
RDW: 12.3 % (ref 11.0–15.0)
Total Lymphocyte: 27.6 %
WBC: 9.8 10*3/uL (ref 3.8–10.8)
WBCMIX: 755 {cells}/uL (ref 200–950)

## 2017-09-26 LAB — BASIC METABOLIC PANEL WITH GFR
BUN: 13 mg/dL (ref 7–25)
CALCIUM: 9.2 mg/dL (ref 8.6–10.4)
CO2: 29 mmol/L (ref 20–32)
CREATININE: 0.8 mg/dL (ref 0.60–0.93)
Chloride: 107 mmol/L (ref 98–110)
GFR, Est African American: 87 mL/min/{1.73_m2} (ref >=60)
GFR, Est Non African American: 75 mL/min/{1.73_m2} (ref >=60)
Glucose, Bld: 83 mg/dL (ref 65–99)
Potassium: 4.6 mmol/L (ref 3.5–5.3)
Sodium: 143 mmol/L (ref 135–146)

## 2017-09-26 LAB — LIPID PANEL
CHOL/HDL RATIO: 2.7 (calc) (ref <5.0)
Cholesterol: 158 mg/dL (ref <200)
HDL: 59 mg/dL (ref >50)
LDL CHOLESTEROL (CALC): 82 mg/dL
Non-HDL Cholesterol (Calc): 99 mg/dL (calc) (ref <130)
TRIGLYCERIDES: 86 mg/dL (ref <150)

## 2017-09-26 LAB — HEPATIC FUNCTION PANEL
AG RATIO: 1.8 (calc) (ref 1.0–2.5)
ALBUMIN MSPROF: 4.2 g/dL (ref 3.6–5.1)
ALT: 17 U/L (ref 6–29)
AST: 19 U/L (ref 10–35)
Alkaline phosphatase (APISO): 52 U/L (ref 33–130)
BILIRUBIN INDIRECT: 0.3 mg/dL (ref 0.2–1.2)
Bilirubin, Direct: 0.1 mg/dL (ref 0.0–0.2)
GLOBULIN: 2.4 g/dL (ref 1.9–3.7)
Total Bilirubin: 0.4 mg/dL (ref 0.2–1.2)
Total Protein: 6.6 g/dL (ref 6.1–8.1)

## 2017-09-26 LAB — TSH: TSH: 2.19 m[IU]/L (ref 0.40–4.50)

## 2017-09-26 LAB — VITAMIN D 25 HYDROXY (VIT D DEFICIENCY, FRACTURES): VIT D 25 HYDROXY: 69 ng/mL (ref 30–100)

## 2017-09-26 LAB — HEMOGLOBIN A1C
Hgb A1c MFr Bld: 5.7 % of total Hgb — ABNORMAL HIGH (ref <5.7)
Mean Plasma Glucose: 117 (calc)
eAG (mmol/L): 6.5 (calc)

## 2017-10-10 ENCOUNTER — Telehealth (INDEPENDENT_AMBULATORY_CARE_PROVIDER_SITE_OTHER): Payer: Self-pay | Admitting: Orthopaedic Surgery

## 2017-10-10 ENCOUNTER — Other Ambulatory Visit (INDEPENDENT_AMBULATORY_CARE_PROVIDER_SITE_OTHER): Payer: Self-pay

## 2017-10-10 DIAGNOSIS — M4807 Spinal stenosis, lumbosacral region: Secondary | ICD-10-CM

## 2017-10-10 NOTE — Telephone Encounter (Signed)
done

## 2017-10-10 NOTE — Telephone Encounter (Signed)
Yes, [please schedule mri of ls spine

## 2017-10-10 NOTE — Telephone Encounter (Signed)
Patient called stating that she saw Aaron Edelman on 1/16 who told her that " if her back did not feel better he would send a referral to [place] for an injection."

## 2017-10-10 NOTE — Telephone Encounter (Signed)
Please advise 

## 2017-10-11 NOTE — Telephone Encounter (Signed)
Cancelled MRI will send referral to dr newton for [facility] injection of Lumbar

## 2017-10-13 ENCOUNTER — Other Ambulatory Visit (INDEPENDENT_AMBULATORY_CARE_PROVIDER_SITE_OTHER): Payer: Self-pay

## 2017-10-13 ENCOUNTER — Telehealth (INDEPENDENT_AMBULATORY_CARE_PROVIDER_SITE_OTHER): Payer: Self-pay | Admitting: Orthopaedic Surgery

## 2017-10-13 DIAGNOSIS — G8929 Other chronic pain: Secondary | ICD-10-CM

## 2017-10-13 DIAGNOSIS — M545 Low back pain: Principal | ICD-10-CM

## 2017-10-13 NOTE — Telephone Encounter (Signed)
Scheduled patient for a STAT ESI injection of lumbar. Called GI left message on machine.

## 2017-10-13 NOTE — Telephone Encounter (Signed)
Patient request a call ASAP about injection she would like to have in back. Patient wanted to get this scheduled at [place] within this coming week if possible. Please call patient back.

## 2017-10-18 NOTE — Progress Notes (Signed)
Cologuard form had to be re-done under Dr. Melford Aase.

## 2017-10-19 ENCOUNTER — Ambulatory Visit
Admission: RE | Admit: 2017-10-19 | Discharge: 2017-10-19 | Disposition: A | Discharge status: Home / Self Care | Payer: Medicare Other | Source: Ambulatory Visit | Attending: Orthopaedic Surgery | Admitting: Orthopaedic Surgery

## 2017-10-19 DIAGNOSIS — M4807 Spinal stenosis, lumbosacral region: Secondary | ICD-10-CM

## 2017-10-19 DIAGNOSIS — M48061 Spinal stenosis, lumbar region without neurogenic claudication: Secondary | ICD-10-CM | POA: Diagnosis not present

## 2017-10-26 DIAGNOSIS — Z1212 Encounter for screening for malignant neoplasm of rectum: Secondary | ICD-10-CM | POA: Diagnosis not present

## 2017-10-26 DIAGNOSIS — Z1211 Encounter for screening for malignant neoplasm of colon: Secondary | ICD-10-CM | POA: Diagnosis not present

## 2017-10-26 LAB — COLOGUARD: Cologuard: NEGATIVE

## 2017-11-02 LAB — COLOGUARD

## 2017-11-03 ENCOUNTER — Encounter: Payer: Self-pay | Admitting: Adult Health

## 2017-11-06 ENCOUNTER — Ambulatory Visit
Admission: RE | Admit: 2017-11-06 | Discharge: 2017-11-06 | Disposition: A | Discharge status: Home / Self Care | Payer: Medicare Other | Source: Ambulatory Visit | Attending: Orthopedic Surgery | Admitting: Orthopedic Surgery

## 2017-11-06 DIAGNOSIS — G8929 Other chronic pain: Secondary | ICD-10-CM

## 2017-11-06 DIAGNOSIS — M545 Low back pain: Principal | ICD-10-CM

## 2017-11-06 DIAGNOSIS — M47817 Spondylosis without myelopathy or radiculopathy, lumbosacral region: Secondary | ICD-10-CM | POA: Diagnosis not present

## 2017-11-06 MED ORDER — METHYLPREDNISOLONE ACETATE 40 MG/ML INJ SUSP (RADIOLOG
120.0000 mg | Freq: Once | INTRAMUSCULAR | Status: AC
  Administered 2017-11-06: 120 mg via EPIDURAL

## 2017-11-06 MED ORDER — IOPAMIDOL (ISOVUE-M 200) INJECTION 41%
1.0000 mL | Freq: Once | INTRAMUSCULAR | Status: AC
  Administered 2017-11-06: 1 mL via EPIDURAL

## 2017-12-27 ENCOUNTER — Other Ambulatory Visit: Payer: Self-pay | Admitting: Internal Medicine

## 2017-12-27 ENCOUNTER — Ambulatory Visit (INDEPENDENT_AMBULATORY_CARE_PROVIDER_SITE_OTHER): Payer: Medicare Other | Admitting: Internal Medicine

## 2017-12-27 ENCOUNTER — Encounter: Payer: Self-pay | Admitting: Internal Medicine

## 2017-12-27 VITALS — BP 120/82 | HR 71 | Temp 97.5°F | Resp 14 | Ht 63.0 in | Wt 152.8 lb

## 2017-12-27 DIAGNOSIS — R51 Headache: Secondary | ICD-10-CM | POA: Diagnosis not present

## 2017-12-27 DIAGNOSIS — M26623 Arthralgia of bilateral temporomandibular joint: Secondary | ICD-10-CM | POA: Diagnosis not present

## 2017-12-27 DIAGNOSIS — E559 Vitamin D deficiency, unspecified: Secondary | ICD-10-CM

## 2017-12-27 DIAGNOSIS — R7309 Other abnormal glucose: Secondary | ICD-10-CM

## 2017-12-27 DIAGNOSIS — I10 Essential (primary) hypertension: Secondary | ICD-10-CM | POA: Diagnosis not present

## 2017-12-27 DIAGNOSIS — E782 Mixed hyperlipidemia: Secondary | ICD-10-CM

## 2017-12-27 DIAGNOSIS — R7303 Prediabetes: Secondary | ICD-10-CM

## 2017-12-27 DIAGNOSIS — R519 Headache, unspecified: Secondary | ICD-10-CM

## 2017-12-27 DIAGNOSIS — Z79899 Other long term (current) drug therapy: Secondary | ICD-10-CM | POA: Diagnosis not present

## 2017-12-27 MED ORDER — PREDNISONE 20 MG PO TABS: ORAL_TABLET | ORAL | 0 refills | Status: DC

## 2017-12-27 NOTE — Patient Instructions (Signed)

## 2017-12-28 DIAGNOSIS — H2513 Age-related nuclear cataract, bilateral: Secondary | ICD-10-CM | POA: Diagnosis not present

## 2017-12-28 DIAGNOSIS — H01009 Unspecified blepharitis unspecified eye, unspecified eyelid: Secondary | ICD-10-CM | POA: Diagnosis not present

## 2017-12-28 DIAGNOSIS — H18893 Other specified disorders of cornea, bilateral: Secondary | ICD-10-CM | POA: Diagnosis not present

## 2017-12-28 DIAGNOSIS — H25013 Cortical age-related cataract, bilateral: Secondary | ICD-10-CM | POA: Diagnosis not present

## 2017-12-28 LAB — CBC WITH DIFFERENTIAL/PLATELET
BASOS ABS: 42 {cells}/uL (ref 0–200)
Basophils Relative: 0.4 %
EOS ABS: 148 {cells}/uL (ref 15–500)
Eosinophils Relative: 1.4 %
HEMATOCRIT: 42.9 % (ref 35.0–45.0)
HEMOGLOBIN: 14.5 g/dL (ref 11.7–15.5)
Lymphs Abs: 2851 cells/uL (ref 850–3900)
MCH: 30.3 pg (ref 27.0–33.0)
MCHC: 33.8 g/dL (ref 32.0–36.0)
MCV: 89.6 fL (ref 80.0–100.0)
MONOS PCT: 8.2 %
MPV: 10.5 fL (ref 7.5–12.5)
NEUTROS ABS: 6689 {cells}/uL (ref 1500–7800)
Neutrophils Relative %: 63.1 %
Platelets: 340 10*3/uL (ref 140–400)
RBC: 4.79 10*6/uL (ref 3.80–5.10)
RDW: 12.7 % (ref 11.0–15.0)
Total Lymphocyte: 26.9 %
WBC: 10.6 10*3/uL (ref 3.8–10.8)
WBCMIX: 869 {cells}/uL (ref 200–950)

## 2017-12-28 LAB — LIPID PANEL
Cholesterol: 145 mg/dL (ref <200)
HDL: 59 mg/dL (ref >50)
LDL Cholesterol (Calc): 66 mg/dL (calc)
Non-HDL Cholesterol (Calc): 86 mg/dL (calc) (ref <130)
Total CHOL/HDL Ratio: 2.5 (calc) (ref <5.0)
Triglycerides: 121 mg/dL (ref <150)

## 2017-12-28 LAB — HEMOGLOBIN A1C
EAG (MMOL/L): 6.3 (calc)
Hgb A1c MFr Bld: 5.6 % of total Hgb (ref <5.7)
MEAN PLASMA GLUCOSE: 114 (calc)

## 2017-12-28 LAB — COMPLETE METABOLIC PANEL WITH GFR
AG RATIO: 2 (calc) (ref 1.0–2.5)
ALKALINE PHOSPHATASE (APISO): 56 U/L (ref 33–130)
ALT: 17 U/L (ref 6–29)
AST: 20 U/L (ref 10–35)
Albumin: 4.4 g/dL (ref 3.6–5.1)
BILIRUBIN TOTAL: 0.4 mg/dL (ref 0.2–1.2)
BUN: 14 mg/dL (ref 7–25)
CHLORIDE: 107 mmol/L (ref 98–110)
CO2: 31 mmol/L (ref 20–32)
Calcium: 9.8 mg/dL (ref 8.6–10.4)
Creat: 0.85 mg/dL (ref 0.60–0.93)
GFR, EST AFRICAN AMERICAN: 80 mL/min/{1.73_m2} (ref >=60)
GFR, Est Non African American: 69 mL/min/{1.73_m2} (ref >=60)
GLOBULIN: 2.2 g/dL (ref 1.9–3.7)
Glucose, Bld: 87 mg/dL (ref 65–99)
Potassium: 4.9 mmol/L (ref 3.5–5.3)
SODIUM: 144 mmol/L (ref 135–146)
Total Protein: 6.6 g/dL (ref 6.1–8.1)

## 2017-12-28 LAB — TSH: TSH: 2.25 mIU/L (ref 0.40–4.50)

## 2017-12-28 LAB — INSULIN, RANDOM: Insulin: 23.7 u[IU]/mL — ABNORMAL HIGH (ref 2.0–19.6)

## 2017-12-28 LAB — MAGNESIUM: MAGNESIUM: 2.2 mg/dL (ref 1.5–2.5)

## 2017-12-28 LAB — VITAMIN D 25 HYDROXY (VIT D DEFICIENCY, FRACTURES): Vit D, 25-Hydroxy: 75 ng/mL (ref 30–100)

## 2017-12-30 ENCOUNTER — Encounter: Payer: Self-pay | Admitting: Internal Medicine

## 2017-12-30 NOTE — Progress Notes (Signed)
Patient ID: Rebekah Hamilton, female   DOB: May 07, 1946, 72 y.o.   MRN: [id number]      This very nice 72 y.o. recently separated WF  presents for 3 month follow up with HTN, HLD, Pre-Diabetes and Vitamin D Deficiency. Patient also has reactive Depression related to ongoing difficulty achieving separation agreement.     Today she's also c/o a bilat frontotemporal HA.      Patient is followed expectantly for labile HTN & BP has been controlled at home. Today's BP is at goal - 120/82. Patient has had no complaints of any cardiac type chest pain, palpitations, dyspnea / orthopnea / PND, dizziness, claudication, or dependent edema.     Hyperlipidemia is controlled with diet & meds. Patient denies myalgias or other med SE's. Last Lipids were at goal: Lab Results  Component Value Date   CHOL 145 12/27/2017   HDL 59 12/27/2017   LDLCALC 66 12/27/2017   TRIG 121 12/27/2017   CHOLHDL 2.5 12/27/2017      Also, the patient has history of PreDiabetes (A1c 5.8%/w2015) and has had no symptoms of reactive hypoglycemia, diabetic polys, paresthesias or visual blurring.  Last A1c was Normal & at goal: Lab Results  Component Value Date   HGBA1C 5.6 12/27/2017      Further, the patient also has history of Vitamin D Deficiency("36"/2016) and supplements vitamin D without any suspected side-effects. Last vitamin D was Normal & at goal:  Lab Results  Component Value Date   VD25OH 74 12/27/2017   Current Outpatient Medications on File Prior to Visit  Medication Sig  . Ascorbic Acid (VITAMIN C PO) 500 mg 2 (two) times daily.  Marland Kitchen aspirin 325 MG tablet Take 325 mg by mouth daily.  . Cholecalciferol (VITAMIN D PO) Take by mouth. Takes 10000 to 12000 units daily.  . clotrimazole-betamethasone (LOTRISONE) cream Apply 1 application topically 2 (two) times daily. (Patient taking differently: Apply 1 application topically as needed. )  . DULoxetine (CYMBALTA) 60 MG capsule Take 1 capsule (60 mg total) by mouth daily.    Marland Kitchen estradiol (ESTRACE) 0.5 MG tablet Take 1 tablet (0.5 mg total) by mouth daily.  . fexofenadine (ALLEGRA) 180 MG tablet Take 180 mg by mouth daily.  . pravastatin (PRAVACHOL) 40 MG tablet Take 1 tablet (40 mg total) by mouth daily.  . progesterone (PROMETRIUM) 100 MG capsule TAKE 1 CAPSULE BY MOUTH EVERY DAY  . rizatriptan (MAXALT) 10 MG tablet TAKE 1 TABLET BY MOUTH AS NEEDED FOR MIGRAINE, MAY REPEAT DOSE IN 2 HOURS IF NEEDED   No current facility-administered medications on file prior to visit.    Allergies  Allergen Reactions  . Augmentin [Amoxicillin-Pot Clavulanate] Diarrhea  . Prozac [Fluoxetine Hcl] Other (See Comments)    Disoriented  . Wellbutrin [Bupropion] Other (See Comments)    Insomnia/Nervousness   PMHx:   Past Medical History:  Diagnosis Date  . Allergy   . Anemia   . Anxiety   . Depression   . Hyperlipidemia   . Hypertension   . Lumbar stenosis   . Migraines   . Prediabetes    Immunization History  Administered Date(s) Administered  . Influenza, High Dose Seasonal PF 05/29/2014, 06/02/2015, 06/16/2017  . Influenza,inj,quad, With Preservative 08/12/2013, 07/04/2016  . Pneumococcal Conjugate-13 04/07/2015  . Pneumococcal Polysaccharide-23 05/29/2013  . Td 08/15/2005, 07/04/2016   Past Surgical History:  Procedure Laterality Date  . BREAST LUMPECTOMY    . CHOLECYSTECTOMY  1999   FHx:    Reviewed /  unchanged  SHx:    Reviewed / unchanged  Systems Review:  Constitutional: Denies fever, chills, wt changes, headaches, insomnia, fatigue, night sweats, change in appetite. Eyes: Denies redness, blurred vision, diplopia, discharge, itchy, watery eyes.  ENT: Denies discharge, congestion, post nasal drip, epistaxis, sore throat, earache, hearing loss, dental pain, tinnitus, vertigo, sinus pain, snoring.  CV: Denies chest pain, palpitations, irregular heartbeat, syncope, dyspnea, diaphoresis, orthopnea, PND, claudication or edema. Respiratory: denies cough,  dyspnea, DOE, pleurisy, hoarseness, laryngitis, wheezing.  Gastrointestinal: Denies dysphagia, odynophagia, heartburn, reflux, water brash, abdominal pain or cramps, nausea, vomiting, bloating, diarrhea, constipation, hematemesis, melena, hematochezia  or hemorrhoids. Genitourinary: Denies dysuria, frequency, urgency, nocturia, hesitancy, discharge, hematuria or flank pain. Musculoskeletal: Denies arthralgias, myalgias, stiffness, jt. swelling, pain, limping or strain/sprain.  Skin: Denies pruritus, rash, hives, warts, acne, eczema or change in skin lesion(s). Neuro: No weakness, tremor, incoordination, spasms, paresthesia or pain. Psychiatric: Denies confusion, memory loss or sensory loss. Endo: Denies change in weight, skin or hair change.  Heme/Lymph: No excessive bleeding, bruising or enlarged lymph nodes.  Physical Exam  BP 120/82   Pulse 71   Temp (!) 97.5 F (36.4 C)   Resp 14   Ht 5\' 3"  (1.6 m)   Wt 152 lb 12.8 oz (69.3 kg)   SpO2 94%   BMI 27.07 kg/m   Appears  well nourished, well groomed  and in no distress. Tenderness over frontotemporal areas lessened wit applying digital pressure.   Eyes: PERRLA, EOMs, conjunctiva no swelling or erythema. Sinuses: No frontal/maxillary tenderness ENT/Mouth: EAC's clear, TM's nl w/o erythema, bulging. (+) tender over both TM jt.s. Nares clear w/o erythema, swelling, exudates. Oropharynx clear without erythema or exudates. Oral hygiene is good. Tongue normal, non obstructing. Hearing intact.  Neck: Supple. Thyroid not palpable. Car 2+/2+ without bruits, nodes or JVD. Chest: Respirations nl with BS clear & equal w/o rales, rhonchi, wheezing or stridor.  Cor: Heart sounds normal w/ regular rate and rhythm without sig. murmurs, gallops, clicks or rubs. Peripheral pulses normal and equal  without edema.  Abdomen: Soft & bowel sounds normal. Non-tender w/o guarding, rebound, hernias, masses or organomegaly.  Lymphatics: Unremarkable.    Musculoskeletal: Full ROM all peripheral extremities, joint stability, 5/5 strength and normal gait.  Skin: Warm, dry without exposed rashes, lesions or ecchymosis apparent.  Neuro: Cranial nerves intact, reflexes equal bilaterally. Sensory-motor testing grossly intact. Tendon reflexes grossly intact.  Pysch: Alert & oriented x 3.  Insight and judgement nl & appropriate. No ideations.  Assessment and Plan:  1. Essential hypertension  - Continue medication, monitor blood pressure at home.  - Continue DASH diet.  Reminder to go to the ER if any CP,  SOB, nausea, dizziness, severe HA, changes vision/speech.  - CBC with Differential/Platelet - COMPLETE METABOLIC PANEL WITH GFR - Magnesium - TSH  2. Hyperlipidemia, mixed  - Continue diet/meds, exercise,& lifestyle modifications.  - Continue monitor periodic cholesterol/liver & renal functions   - Lipid panel - TSH  3. Abnormal glucose  - Continue diet, exercise, lifestyle modifications.  - Monitor appropriate labs.  - Hemoglobin A1c - Insulin, random  4. Vitamin D deficiency  - Continue supplementation.  - VITAMIN D 25 Hydroxyl  5. Prediabetes  - Hemoglobin A1c - Insulin, random  6. Nonintractable headache  - predniSONE ( 20 MG tablet; 1 tab 3 x day for 3 days, then 1 tab 2 x day for 3 days, then 1 tab 1 x day for 5 days  Dispense: 20 tablet;  Refill: 0  7. Bilateral temporomandibular joint pain  - predniSONE  20 MG tablet; 1 tab 3 x day for 3 days, then 1 tab 2 x day for 3 days, then 1 tab 1 x day for 5 days  Dispense: 20 tablet; Refill: 0  8. Medication management  - CBC with Differential/Platelet - COMPLETE METABOLIC PANEL WITH GFR - Magnesium - Lipid panel - TSH - Hemoglobin A1c - Insulin, random - VITAMIN D 25 Hydroxy           Discussed  regular exercise, BP monitoring, weight control to achieve/maintain BMI less than 25 and discussed med and SE's. Recommended labs to assess and monitor clinical  status with further disposition pending results of labs. Over 30 minutes of exam, counseling, chart review was performed.

## 2018-01-06 ENCOUNTER — Other Ambulatory Visit: Payer: Self-pay | Admitting: Internal Medicine

## 2018-01-06 DIAGNOSIS — F411 Generalized anxiety disorder: Secondary | ICD-10-CM

## 2018-01-11 ENCOUNTER — Ambulatory Visit (INDEPENDENT_AMBULATORY_CARE_PROVIDER_SITE_OTHER): Payer: Medicare Other | Admitting: Orthopaedic Surgery

## 2018-01-11 ENCOUNTER — Ambulatory Visit (INDEPENDENT_AMBULATORY_CARE_PROVIDER_SITE_OTHER): Payer: Medicare Other

## 2018-01-11 ENCOUNTER — Encounter (INDEPENDENT_AMBULATORY_CARE_PROVIDER_SITE_OTHER): Payer: Self-pay | Admitting: Orthopaedic Surgery

## 2018-01-11 VITALS — BP 149/86 | HR 72 | Ht 63.0 in | Wt 156.0 lb

## 2018-01-11 DIAGNOSIS — M545 Low back pain: Secondary | ICD-10-CM | POA: Diagnosis not present

## 2018-01-11 MED ORDER — HYDROCODONE-ACETAMINOPHEN 5-325 MG PO TABS: ORAL_TABLET | ORAL | 0 refills | Status: DC
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
## 2018-01-17 ENCOUNTER — Ambulatory Visit: Payer: Medicare Other | Attending: Orthopaedic Surgery | Admitting: Physical Therapy

## 2018-01-17 ENCOUNTER — Other Ambulatory Visit: Payer: Self-pay

## 2018-01-17 ENCOUNTER — Encounter: Payer: Self-pay | Admitting: Physical Therapy

## 2018-01-17 DIAGNOSIS — M5442 Lumbago with sciatica, left side: Secondary | ICD-10-CM | POA: Insufficient documentation

## 2018-01-17 DIAGNOSIS — M6281 Muscle weakness (generalized): Secondary | ICD-10-CM | POA: Diagnosis not present
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
## 2018-01-17 NOTE — Patient Instructions (Addendum)
[email or web address]  [facility] [place], [place] [address] Directions  [phone]  Silver Sneakers - ask your insurance   Sleeping on Side    Place pillow between knees and feet. Use cervical support under neck and a roll around waist as needed.   Copyright  VHI. All rights reserved.  Posture - Sitting    Sit upright, head facing forward. Try using a roll to support lower back. Keep shoulders relaxed, and avoid rounded back. Keep hips level with knees. Avoid crossing legs for long periods.   Copyright  VHI. All rights reserved.  Posture - Standing    Good posture is important. Avoid slouching and forward head thrust. Maintain curve in low back and align ears over shoul- ders, hips over ankles. Elongate your waist and feel like a string is pulling you up.   Copyright  VHI. All rights reserved.  Getting Into / Out of Car    Lower self onto seat, scoot back, then bring in one leg at a time. Reverse sequence to get out.   Copyright  VHI. All rights reserved.  [facility] [address] [place], [place] [address] Phone # [phone] Fax [phone]

## 2018-01-26 ENCOUNTER — Encounter: Payer: Self-pay | Admitting: Physical Therapy

## 2018-01-26 ENCOUNTER — Ambulatory Visit: Payer: Medicare Other | Admitting: Physical Therapy

## 2018-01-26 DIAGNOSIS — M6281 Muscle weakness (generalized): Secondary | ICD-10-CM | POA: Diagnosis not present

## 2018-01-26 DIAGNOSIS — M5442 Lumbago with sciatica, left side: Secondary | ICD-10-CM | POA: Diagnosis not present

## 2018-01-26 NOTE — Therapy (Signed)
[facility] [address], [place], [place], [address] Phone: [phone]   Fax:  [phone]  Physical Therapy Treatment  Patient Details  Name: Rebekah Hamilton MRN: [id number] Date of Birth: Jan 03, 1946 Referring Provider: Dr. Joni Fears   Encounter Date: 01/26/2018  PT End of Session - 01/26/18 0927    Visit Number  2    Date for PT Re-Evaluation  03/14/18    Authorization Type  Medicare; [employer]    PT Start Time  3016    PT Stop Time  0934    PT Time Calculation (min)  47 min    Activity Tolerance  Patient tolerated treatment well;No increased pain    Behavior During Therapy  WFL for tasks assessed/performed       Past Medical History:  Diagnosis Date  . Allergy   . Anemia   . Anxiety   . Depression   . Hyperlipidemia   . Hypertension   . Lumbar stenosis   . Migraines   . Prediabetes     Past Surgical History:  Procedure Laterality Date  . BREAST LUMPECTOMY    . CHOLECYSTECTOMY  1999    There were no vitals filed for this visit.  Subjective Assessment - 01/26/18 0851    Subjective  Pt reports that she continues to have pain along the left hip down to the knee. She has been trying to sleep with the pillow between her knees which helped alot.     Pertinent History  Scoliosis and Stenosis    Patient Stated Goals  ways to alleviate pain; possible surgery on L5, S1;     Currently in Pain?  Yes    Pain Score  2     Pain Location  Hip    Pain Orientation  Left    Pain Descriptors / Indicators  Aching;Dull    Pain Type  Acute pain    Pain Radiating Towards  down to Lt knee     Pain Onset  1 to 4 weeks ago    Pain Frequency  Constant    Aggravating Factors   walking, stairs, weight on the Lt leg     Pain Relieving Factors  relaxing    Effect of Pain on Daily Activities  difficulty getting up in the mornings.                        [facility] Adult PT Treatment/Exercise -  01/26/18 0001      Exercises   Exercises  Knee/Hip      Knee/Hip Exercises: Supine   Bridges  Both;1 set;10 reps    Other Supine Knee/Hip Exercises  hooklying low trunk rotation x15 reps Lt/Rt; B knees to chest with LE on red physioball x20 reps      Modalities   Modalities  Cryotherapy      Cryotherapy   Number Minutes Cryotherapy  7 Minutes    Cryotherapy Location  Hip Lt hip    Type of Cryotherapy  Ice pack      Manual Therapy   Manual Therapy  Soft tissue mobilization    Manual therapy comments  Lt sidelying    Soft tissue mobilization  Lt glute med/TFL/vastus lateralis             PT Education - 01/26/18 0927    Education Details  ice parameters; HEP reviewed [id number]    Person(s) Educated  Patient    Methods  Explanation    Comprehension  Verbalized understanding;Returned demonstration       PT Short Term Goals - 01/17/18 1008      PT SHORT TERM GOAL #1   Title  independent with initial HEP and understand what community resources are available    Time  4    Period  Weeks    Status  New    Target Date  02/14/18      PT SHORT TERM GOAL #2   Title  Pain in left leg decreased >/= 25%    Time  4    Period  Weeks    Status  New    Target Date  02/14/18        PT Long Term Goals - 01/17/18 0837      PT LONG TERM GOAL #1   Title  independent with HEP including water exercise and possible gym    Time  8    Period  Weeks    Status  New    Target Date  03/14/18      PT LONG TERM GOAL #2   Title  walking with improved weight on left leg due to increased trunk strength for 15 minutes for 2 times per week    Time  8    Period  Weeks    Status  New    Target Date  03/14/18      PT LONG TERM GOAL #3   Title  pain with daily activties in left leg decreased >/= 50% due to improved posture and strength    Time  8    Period  Weeks    Status  New    Target Date  03/14/18      PT LONG TERM GOAL #4   Title  FOTO score </= 30% limitation    Time  8     Period  Weeks    Status  New    Target Date  03/14/18            Plan - 01/26/18 7062    Clinical Impression Statement  Pt arrived with reports of mild ache in her Lt hip and lateral thigh. Session focused on providing a HEP to address lumbar and hip mobility deficits. Pt had increased tenderness along the TFL and gluteals, so this was addressed during today's session. Pt was encouraged to apply ice as needed to assess treatment response. Ended session without increase in pain. Will continue with current POC to address pain, weakness and limited lumbar/hip mobility.     Rehab Potential  Excellent    Clinical Impairments Affecting Rehab Potential  None pertinent    PT Frequency  2x / week    PT Duration  8 weeks    PT Treatment/Interventions  Cryotherapy;Electrical Stimulation;Moist Heat;Traction;Therapeutic exercise;Therapeutic activities;Neuromuscular re-education;Patient/family education;Manual techniques;Passive range of motion;Dry needling    PT Next Visit Plan  f/u on response to ice; soft tissue work to lumbar/hip; back strengthening; body mechanincs; modalities for pain releif; bilateral hip strength    PT Home Exercise Plan  [email or web address]    Consulted and Agree with Plan of Care  Patient       Patient will benefit from skilled therapeutic intervention in order to improve the following deficits and impairments:  Pain, Increased fascial restricitons, Decreased mobility, Increased muscle spasms, Decreased activity tolerance, Decreased endurance, Decreased range of motion, Decreased strength  Visit Diagnosis: Acute left-sided low back pain with left-sided sciatica  Muscle weakness (generalized)  Problem List Patient Active Problem List   Diagnosis Date Noted  . CKD (chronic kidney disease), symptom management only, stage 2 (mild) 09/24/2017  . Encounter for Medicare annual wellness exam 06/02/2015  . Glaucoma 06/02/2015  . Vitamin D deficiency 10/21/2014  . Medication  management 12/01/2013  . Hyperlipidemia   . Hypertension   . Other abnormal glucose   . Migraines   . Allergy   . Anxiety   . Depression, major, recurrent, in remission ([place])   . Lumbar stenosis     10:23 AM,01/26/18 Sherol Dade PT, DPT [place] [facility] [place]  [facility] [address] [place], [place], [address] Phone: [phone]   Fax:  [phone]  Name: Rebekah Hamilton MRN: [id number] Date of Birth: Jun 23, 1946

## 2018-01-29 ENCOUNTER — Ambulatory Visit: Payer: Medicare Other | Admitting: Physical Therapy

## 2018-01-29 ENCOUNTER — Encounter: Payer: Self-pay | Admitting: Physical Therapy

## 2018-01-29 DIAGNOSIS — M6281 Muscle weakness (generalized): Secondary | ICD-10-CM | POA: Diagnosis not present

## 2018-01-29 DIAGNOSIS — M5442 Lumbago with sciatica, left side: Secondary | ICD-10-CM

## 2018-01-29 NOTE — Patient Instructions (Signed)
Access Code: [email or web address]  URL: [email or web address]  Date: 01/29/2018  Prepared by: Elly Modena   Exercises  Supine Double Knee to Chest - 15 reps - 3 sets - 1x daily - 7x weekly  Supine Lower Trunk Rotation - 15 reps - 3 sets - 1x daily - 7x weekly  Supine Bridge - 10 reps - 3 sets - 1x daily - 7x weekly  Hooklying Isometric Clamshell - 10 reps - 2 sets - 1x daily - 7x weekly

## 2018-01-30 NOTE — Therapy (Signed)
[facility] [address], [place], [place], [address] Phone: [phone]   Fax:  [phone]  Physical Therapy Treatment  Patient Details  Name: Rebekah Hamilton MRN: [id number] Date of Birth: Dec 19, 1945 Referring Provider: Dr. Joni Fears   Encounter Date: 01/29/2018  PT End of Session - 01/30/18 0718    Visit Number  3    Date for PT Re-Evaluation  03/14/18    Authorization Type  Medicare; [employer]    PT Start Time  0630    PT Stop Time  1658    PT Time Calculation (min)  42 min    Activity Tolerance  Patient tolerated treatment well;No increased pain    Behavior During Therapy  WFL for tasks assessed/performed       Past Medical History:  Diagnosis Date  . Allergy   . Anemia   . Anxiety   . Depression   . Hyperlipidemia   . Hypertension   . Lumbar stenosis   . Migraines   . Prediabetes     Past Surgical History:  Procedure Laterality Date  . BREAST LUMPECTOMY    . CHOLECYSTECTOMY  1999    There were no vitals filed for this visit.  Subjective Assessment - 01/29/18 1622    Subjective  Pt notes that really sore on Saturday. She has noticed the pain going down below her knee. No pain currently, but she did have some this morning when she was sitting for a while.     Pertinent History  Scoliosis and Stenosis    Patient Stated Goals  ways to alleviate pain; possible surgery on L5, S1;     Currently in Pain?  No/denies    Pain Onset  1 to 4 weeks ago                       [facility] Adult PT Treatment/Exercise - 01/30/18 0001      Knee/Hip Exercises: Aerobic   Nustep  L2 x5 min, PT present to discuss recent change in symptoms       Knee/Hip Exercises: Seated   Other Seated Knee/Hip Exercises  seated hip IR with yellow TB x10 reps each       Knee/Hip Exercises: Supine   Other Supine Knee/Hip Exercises  hip extension isometric hold x3 sec, x10 reps each     Other Supine  Knee/Hip Exercises  clams with red TB x10 reps each; alt bent knee heel tap from 90/90 position x10 reps each   Low trunk rotation in hooklying position x10 reps Lt/Rt     Manual Therapy   Manual Therapy  Taping    Kinesiotex  Facilitate Muscle      Kinesiotix   Facilitate Muscle   power strips in star pattern across low lumbar spine              PT Education - 01/29/18 1646    Education Details  Access Code: [email or web address]; implications for kinesiotape and care/wear instructions for home      Person(s) Educated  Patient    Methods  Explanation;Demonstration;Verbal cues    Comprehension  Verbalized understanding;Returned demonstration       PT Short Term Goals - 01/30/18 0720      PT SHORT TERM GOAL #1   Title  independent with initial HEP and understand what community resources are available    Time  4    Period  Weeks    Status  Achieved      PT SHORT TERM GOAL #2   Title  Pain in left leg decreased >/= 25%    Time  4    Period  Weeks    Status  On-going        PT Long Term Goals - 01/17/18 8250      PT LONG TERM GOAL #1   Title  independent with HEP including water exercise and possible gym    Time  8    Period  Weeks    Status  New    Target Date  03/14/18      PT LONG TERM GOAL #2   Title  walking with improved weight on left leg due to increased trunk strength for 15 minutes for 2 times per week    Time  8    Period  Weeks    Status  New    Target Date  03/14/18      PT LONG TERM GOAL #3   Title  pain with daily activties in left leg decreased >/= 50% due to improved posture and strength    Time  8    Period  Weeks    Status  New    Target Date  03/14/18      PT LONG TERM GOAL #4   Title  FOTO score </= 30% limitation    Time  8    Period  Weeks    Status  New    Target Date  03/14/18            Plan - 01/30/18 0718    Clinical Impression Statement  Pt with mild pain upon arrival today. She reports possible exacerbation of Lt hip pain  down the LLE after the addition of bridge exercise to her HEP. Therapist made adjustments for pain free completion at home. Continued with therex to promote hip and trunk strength, noting muscle shaking during supine exercises specifically. Ended session with application of kinesiotape to the low back and pt reported no pain end of today's session. Will continue with current POC to address muscle spasm, lack of lumbar/hip mobility and hip/trunk weakness.     Rehab Potential  Excellent    Clinical Impairments Affecting Rehab Potential  None pertinent    PT Frequency  2x / week    PT Duration  8 weeks    PT Treatment/Interventions  Cryotherapy;Electrical Stimulation;Moist Heat;Traction;Therapeutic exercise;Therapeutic activities;Neuromuscular re-education;Patient/family education;Manual techniques;Passive range of motion;Dry needling    PT Next Visit Plan  f/u on response to tape; soft tissue work to lumbar/hip; back strengthening; body mechanincs; modalities for pain releif; bilateral hip strength    PT Home Exercise Plan  [email or web address]    Consulted and Agree with Plan of Care  Patient       Patient will benefit from skilled therapeutic intervention in order to improve the following deficits and impairments:  Pain, Increased fascial restricitons, Decreased mobility, Increased muscle spasms, Decreased activity tolerance, Decreased endurance, Decreased range of motion, Decreased strength  Visit Diagnosis: Acute left-sided low back pain with left-sided sciatica  Muscle weakness (generalized)     Problem List Patient Active Problem List   Diagnosis Date Noted  . CKD (chronic kidney disease), symptom management only, stage 2 (mild) 09/24/2017  . Encounter for Medicare annual wellness exam 06/02/2015  . Glaucoma 06/02/2015  . Vitamin D deficiency 10/21/2014  . Medication management 12/01/2013  . Hyperlipidemia   . Hypertension   . Other abnormal glucose   .  Migraines   . Allergy   . Anxiety    . Depression, major, recurrent, in remission ([place])   . Lumbar stenosis     7:22 AM,01/30/18 Sherol Dade PT, DPT [place] [facility] [place]  [facility] [address], [place], [place], [address] Phone: [phone]   Fax:  [phone]  Name: Melek Pownall MRN: [id number] Date of Birth: 04/17/1946

## 2018-01-31 ENCOUNTER — Encounter: Payer: Self-pay | Admitting: Physical Therapy

## 2018-01-31 ENCOUNTER — Ambulatory Visit: Payer: Medicare Other | Admitting: Physical Therapy

## 2018-01-31 DIAGNOSIS — M6281 Muscle weakness (generalized): Secondary | ICD-10-CM

## 2018-01-31 DIAGNOSIS — M5442 Lumbago with sciatica, left side: Secondary | ICD-10-CM | POA: Diagnosis not present

## 2018-02-01 ENCOUNTER — Encounter: Payer: Self-pay | Admitting: Physical Therapy

## 2018-02-01 NOTE — Therapy (Signed)
[facility] [address], [place], [place], [address] Phone: [phone]   Fax:  [phone]  Physical Therapy Treatment  Patient Details  Name: Rebekah Hamilton MRN: [id number] Date of Birth: 1946/06/30 Referring Provider: Dr. Joni Fears   Encounter Date: 01/31/2018  PT End of Session - 01/31/18 1636    Visit Number  4    Date for PT Re-Evaluation  03/14/18    Authorization Type  Medicare; [employer]    PT Start Time  4166    PT Stop Time  1645    PT Time Calculation (min)  40 min    Activity Tolerance  Patient tolerated treatment well;No increased pain    Behavior During Therapy  WFL for tasks assessed/performed       Past Medical History:  Diagnosis Date  . Allergy   . Anemia   . Anxiety   . Depression   . Hyperlipidemia   . Hypertension   . Lumbar stenosis   . Migraines   . Prediabetes     Past Surgical History:  Procedure Laterality Date  . BREAST LUMPECTOMY    . CHOLECYSTECTOMY  1999    There were no vitals filed for this visit.  Subjective Assessment - 01/31/18 1633    Subjective  Pt states things are going well. No pain currently. She did not have any pain below the knee since her last session.     Pertinent History  Scoliosis and Stenosis    Patient Stated Goals  ways to alleviate pain; possible surgery on L5, S1;     Currently in Pain?  No/denies    Pain Onset  1 to 4 weeks ago                       [facility] Adult PT Treatment/Exercise - 02/01/18 0001      Knee/Hip Exercises: Seated   Sit to Sand  2 sets;without UE support;Other (comment);10 reps yellow TB around knees to decrease valgus, 2nd on foam pad       Knee/Hip Exercises: Supine   Other Supine Knee/Hip Exercises  bent knee 90/90 with alternating heel tap 2x5    Other Supine Knee/Hip Exercises  abdominal bracing with alternating UE flexion with yellow TB x15 reps each      Manual Therapy   Manual  Therapy  Joint mobilization;Soft tissue mobilization    Joint Mobilization  Grade III CPAs L4 to S2 x2 bouts    Soft tissue mobilization  trigger point release Lt glute max with noted twitch response    Kinesiotex  Facilitate Muscle      Kinesiotix   Facilitate Muscle   power strips in star pattern across low lumbar spine              PT Education - 01/31/18 1649    Education Details  kinesiotape application; addition to HEP with sit to stand     Person(s) Educated  Patient    Methods  Explanation;Demonstration;Handout    Comprehension  Verbalized understanding;Returned demonstration       PT Short Term Goals - 01/30/18 0720      PT SHORT TERM GOAL #1   Title  independent with initial HEP and understand what community resources are available    Time  4    Period  Weeks    Status  Achieved      PT SHORT TERM GOAL #2   Title  Pain in  left leg decreased >/= 25%    Time  4    Period  Weeks    Status  On-going        PT Long Term Goals - 01/17/18 5366      PT LONG TERM GOAL #1   Title  independent with HEP including water exercise and possible gym    Time  8    Period  Weeks    Status  New    Target Date  03/14/18      PT LONG TERM GOAL #2   Title  walking with improved weight on left leg due to increased trunk strength for 15 minutes for 2 times per week    Time  8    Period  Weeks    Status  New    Target Date  03/14/18      PT LONG TERM GOAL #3   Title  pain with daily activties in left leg decreased >/= 50% due to improved posture and strength    Time  8    Period  Weeks    Status  New    Target Date  03/14/18      PT LONG TERM GOAL #4   Title  FOTO score </= 30% limitation    Time  8    Period  Weeks    Status  New    Target Date  03/14/18            Plan - 02/01/18 0746    Clinical Impression Statement  Pt reports decreased pain following tape application last session, eliminating pain beyond the knee. Completed manual treatment to the  lumbar spine and sacrum and applied tape to the area with good results noted again this session. Also progressed trunk strengthening exercises with noted fatigue and muscle shaking of the hip and low abdominals. Will continue with current POC to decrease pain, improve strength and flexibility.     Rehab Potential  Excellent    Clinical Impairments Affecting Rehab Potential  None pertinent    PT Frequency  2x / week    PT Duration  8 weeks    PT Treatment/Interventions  Cryotherapy;Electrical Stimulation;Moist Heat;Traction;Therapeutic exercise;Therapeutic activities;Neuromuscular re-education;Patient/family education;Manual techniques;Passive range of motion;Dry needling    PT Next Visit Plan  f/u on response to mobilizations and tape; continue with soft tissue work to lumbar/hip; back strengthening; body mechanincs; bilateral hip strength    PT Home Exercise Plan  [email or web address]    Consulted and Agree with Plan of Care  Patient       Patient will benefit from skilled therapeutic intervention in order to improve the following deficits and impairments:  Pain, Increased fascial restricitons, Decreased mobility, Increased muscle spasms, Decreased activity tolerance, Decreased endurance, Decreased range of motion, Decreased strength  Visit Diagnosis: Acute left-sided low back pain with left-sided sciatica  Muscle weakness (generalized)     Problem List Patient Active Problem List   Diagnosis Date Noted  . CKD (chronic kidney disease), symptom management only, stage 2 (mild) 09/24/2017  . Encounter for Medicare annual wellness exam 06/02/2015  . Glaucoma 06/02/2015  . Vitamin D deficiency 10/21/2014  . Medication management 12/01/2013  . Hyperlipidemia   . Hypertension   . Other abnormal glucose   . Migraines   . Allergy   . Anxiety   . Depression, major, recurrent, in remission ([place])   . Lumbar stenosis     7:51 AM,02/01/18 Sherol Dade PT, DPT [place] [facility]  [facility] [address], [place], [place], [address] Phone: [phone]   Fax:  [phone]  Name: Rebekah Hamilton MRN: [id number] Date of Birth: 1945/12/20

## 2018-02-05 ENCOUNTER — Encounter: Payer: Self-pay | Admitting: Physical Therapy

## 2018-02-05 ENCOUNTER — Ambulatory Visit: Payer: Medicare Other | Admitting: Physical Therapy

## 2018-02-05 DIAGNOSIS — M6281 Muscle weakness (generalized): Secondary | ICD-10-CM | POA: Diagnosis not present

## 2018-02-05 DIAGNOSIS — M5442 Lumbago with sciatica, left side: Secondary | ICD-10-CM | POA: Diagnosis not present

## 2018-02-06 NOTE — Therapy (Signed)
[facility] [address], [place], [place], [address] Phone: [phone]   Fax:  [phone]  Physical Therapy Treatment  Patient Details  Name: Rebekah Hamilton MRN: [id number] Date of Birth: [birth year] Referring Provider: Dr. Joni Fears   Encounter Date: 02/05/2018  PT End of Session - 02/05/18 1657    Visit Number  5    Date for PT Re-Evaluation  03/14/18    Authorization Type  Medicare; [employer]    PT Start Time  1609    PT Stop Time  1655    PT Time Calculation (min)  46 min    Activity Tolerance  Patient tolerated treatment well;No increased pain    Behavior During Therapy  WFL for tasks assessed/performed       Past Medical History:  Diagnosis Date  . Allergy   . Anemia   . Anxiety   . Depression   . Hyperlipidemia   . Hypertension   . Lumbar stenosis   . Migraines   . Prediabetes     Past Surgical History:  Procedure Laterality Date  . BREAST LUMPECTOMY    . CHOLECYSTECTOMY  1999    There were no vitals filed for this visit.  Subjective Assessment - 02/05/18 1613    Subjective  Pt reports that she did great after her last session for a couple of days. Yesterday and today have been "bad days". No pain currently, unless walking.     Pertinent History  Scoliosis and Stenosis    Patient Stated Goals  ways to alleviate pain; possible surgery on L5, S1;     Currently in Pain?  No/denies if walking up to 4 or 5/10    Pain Onset  1 to 4 weeks ago                       [facility] Adult PT Treatment/Exercise - 02/06/18 0001      Knee/Hip Exercises: Stretches   Hip Flexor Stretch  2 reps;Both;30 seconds    Other Knee/Hip Stretches  Upper trap stretch seated with BUE grasp onto overhead bar x30 sec       Knee/Hip Exercises: Standing   Hip Extension  1 set;15 reps;Both    Extension Limitations  forearms resting on countertop      Knee/Hip Exercises: Seated   Marching   Both;1 set;10 reps    Marching Limitations  abdominal bracing     Sit to [employer]  2 sets;10 reps      Knee/Hip Exercises: Sidelying   Hip ABduction  10 reps;1 set;Limitations    Hip ABduction Limitations  therapist tactile cues for proper technique       Knee/Hip Exercises: Prone   Straight Leg Raises  Both;1 set;10 reps      Manual Therapy   Joint Mobilization  Grade III CPAs L4 to S2 x2 bouts    Soft tissue mobilization  STM Lt lumbar paraspinals and Lt glute max       Kinesiotix   Facilitate Muscle   3 power strips in star pattern across low lumbar spine              PT Education - 02/06/18 0756    Education Details  updated HEP    Person(s) Educated  Patient    Methods  Explanation;Verbal cues;Handout    Comprehension  Returned demonstration;Verbalized understanding       PT Short Term Goals - 02/05/18 1642  PT SHORT TERM GOAL #1   Title  independent with initial HEP and understand what community resources are available    Time  4    Period  Weeks    Status  Achieved      PT SHORT TERM GOAL #2   Title  Pain in left leg decreased >/= 25%    Baseline  10-15%    Time  4    Period  Weeks    Status  Partially Met        PT Long Term Goals - 01/17/18 4401      PT LONG TERM GOAL #1   Title  independent with HEP including water exercise and possible gym    Time  8    Period  Weeks    Status  New    Target Date  03/14/18      PT LONG TERM GOAL #2   Title  walking with improved weight on left leg due to increased trunk strength for 15 minutes for 2 times per week    Time  8    Period  Weeks    Status  New    Target Date  03/14/18      PT LONG TERM GOAL #3   Title  pain with daily activties in left leg decreased >/= 50% due to improved posture and strength    Time  8    Period  Weeks    Status  New    Target Date  03/14/18      PT LONG TERM GOAL #4   Title  FOTO score </= 30% limitation    Time  8    Period  Weeks    Status  New    Target Date   03/14/18            Plan - 02/05/18 1646    Clinical Impression Statement  Pt feels she is overall 15% improved from the start of PT. She continues to note pain in the Lt hip/sacral region, but has denied any symptoms below the knee. Completed therex to improve LE and trunk strength with increased difficulty completing hip abduction exercise on the Lt. Applied kinesiotape the the lumbar region, as pt continues to report relief from this. Will continue to progress LE and trunk strength in order to decrease pain and improve safety with daily activity.     Rehab Potential  Excellent    Clinical Impairments Affecting Rehab Potential  None pertinent    PT Frequency  2x / week    PT Duration  8 weeks    PT Treatment/Interventions  Cryotherapy;Electrical Stimulation;Moist Heat;Traction;Therapeutic exercise;Therapeutic activities;Neuromuscular re-education;Patient/family education;Manual techniques;Passive range of motion;Dry needling    PT Next Visit Plan  continue with soft tissue work to lumbar/hip; back strengthening; body mechanincs; bilateral hip strength    PT Home Exercise Plan  [id number]    Consulted and Agree with Plan of Care  Patient       Patient will benefit from skilled therapeutic intervention in order to improve the following deficits and impairments:  Pain, Increased fascial restricitons, Decreased mobility, Increased muscle spasms, Decreased activity tolerance, Decreased endurance, Decreased range of motion, Decreased strength  Visit Diagnosis: Muscle weakness (generalized)  Acute left-sided low back pain with left-sided sciatica     Problem List Patient Active Problem List   Diagnosis Date Noted  . CKD (chronic kidney disease), symptom management only, stage 2 (mild) 09/24/2017  . Encounter for Medicare annual wellness exam 06/02/2015  .  Glaucoma 06/02/2015  . Vitamin D deficiency 10/21/2014  . Medication management 12/01/2013  . Hyperlipidemia   . Hypertension    . Other abnormal glucose   . Migraines   . Allergy   . Anxiety   . Depression, major, recurrent, in remission ([place])   . Lumbar stenosis     9:47 AM,02/06/18 Sherol Dade PT, DPT [place] [facility] [place]  [facility] [address], [place], [place], [address] Phone: [phone]   Fax:  [phone]  Name: Rebekah Hamilton MRN: [id number] Date of Birth: 19-Sep-1945

## 2018-02-07 ENCOUNTER — Ambulatory Visit: Payer: Medicare Other | Admitting: Physical Therapy

## 2018-02-07 ENCOUNTER — Encounter: Payer: Self-pay | Admitting: Physical Therapy

## 2018-02-07 DIAGNOSIS — M5442 Lumbago with sciatica, left side: Secondary | ICD-10-CM

## 2018-02-07 DIAGNOSIS — M6281 Muscle weakness (generalized): Secondary | ICD-10-CM

## 2018-02-08 NOTE — Therapy (Signed)
[facility] [address], [place], [place], [address] Phone: [phone]   Fax:  [phone]  Physical Therapy Treatment  Patient Details  Name: Rebekah Hamilton MRN: [id number] Date of Birth: 10-01-1945 Referring Provider: Dr. Joni Fears   Encounter Date: 02/07/2018  PT End of Session - 02/08/18 0753    Visit Number  6    Date for PT Re-Evaluation  03/14/18    Authorization Type  Medicare; [employer]    PT Start Time  1613    PT Stop Time  1657    PT Time Calculation (min)  44 min    Activity Tolerance  Patient tolerated treatment well;No increased pain    Behavior During Therapy  WFL for tasks assessed/performed       Past Medical History:  Diagnosis Date  . Allergy   . Anemia   . Anxiety   . Depression   . Hyperlipidemia   . Hypertension   . Lumbar stenosis   . Migraines   . Prediabetes     Past Surgical History:  Procedure Laterality Date  . BREAST LUMPECTOMY    . CHOLECYSTECTOMY  1999    There were no vitals filed for this visit.  Subjective Assessment - 02/07/18 1612    Subjective  Pt states that things are going well. She has worsening pain in her buttock throughout the day at work. No pain currently.     Pertinent History  Scoliosis and Stenosis    Patient Stated Goals  ways to alleviate pain; possible surgery on L5, S1;     Currently in Pain?  No/denies nothing to rate, but mild discomfort Lt buttock     Pain Onset  1 to 4 weeks ago                       [facility] Adult PT Treatment/Exercise - 02/08/18 0001      Ambulation/Gait   Ambulation Distance (Feet)  20 Feet x5 trials      Exercises   Exercises  Other Exercises    Other Exercises   quad cat/cow x15 reps; Quadruped posterior hip rocking while maintaining hip neutral x10 reps       Knee/Hip Exercises: Seated   Other Seated Knee/Hip Exercises  attempted seated diagonal lifts, but pt unable. Completed seated  trunk stabilization and abdominal bracing with mirror feedback       Knee/Hip Exercises: Supine   Other Supine Knee/Hip Exercises  BUE chops and lifts x15 reps each direction with adductor squeeze using green TB      Knee/Hip Exercises: Sidelying   Hip ABduction  1 set;10 reps    Hip ABduction Limitations  therapist cuing and top UE horizontal adduction press into table      Knee/Hip Exercises: Prone   Hip Extension  Both;2 sets;10 reps      Manual Therapy   Joint Mobilization  posterior Rt iliac force with anterior sacral mobilization hold during ambulation; Lt posterior shear of ilium during active prone hip extension 2x10 reps with pain decreased during treatment              PT Education - 02/08/18 0753    Education Details  importance of stabilizing trunk during activity    Person(s) Educated  Patient    Methods  Explanation;Handout    Comprehension  Verbalized understanding       PT Short Term Goals - 02/05/18 1642  PT SHORT TERM GOAL #1   Title  independent with initial HEP and understand what community resources are available    Time  4    Period  Weeks    Status  Achieved      PT SHORT TERM GOAL #2   Title  Pain in left leg decreased >/= 25%    Baseline  10-15%    Time  4    Period  Weeks    Status  Partially Met        PT Long Term Goals - 01/17/18 3500      PT LONG TERM GOAL #1   Title  independent with HEP including water exercise and possible gym    Time  8    Period  Weeks    Status  New    Target Date  03/14/18      PT LONG TERM GOAL #2   Title  walking with improved weight on left leg due to increased trunk strength for 15 minutes for 2 times per week    Time  8    Period  Weeks    Status  New    Target Date  03/14/18      PT LONG TERM GOAL #3   Title  pain with daily activties in left leg decreased >/= 50% due to improved posture and strength    Time  8    Period  Weeks    Status  New    Target Date  03/14/18      PT LONG  TERM GOAL #4   Title  FOTO score </= 30% limitation    Time  8    Period  Weeks    Status  New    Target Date  03/14/18            Plan - 02/08/18 0753    Clinical Impression Statement  Pt continues to report intermittent pain and symptoms extending to the LLE. Completed manual techniques of mobilization with movement specifically during ambulation, noting decrease in pain with this while pressure to the sacrum was maintained. Also completed active prone hip extension with mobilization to the ilium decreasing pain on the Lt. Pt's lack of trunk stability appears to be contributing to her symptoms. She had difficulty completing seated trunk stabilization, requiring heavy verbal and visual feedback. Pt reported no increase in pain end of session.     Rehab Potential  Excellent    Clinical Impairments Affecting Rehab Potential  None pertinent    PT Frequency  2x / week    PT Duration  8 weeks    PT Treatment/Interventions  Cryotherapy;Electrical Stimulation;Moist Heat;Traction;Therapeutic exercise;Therapeutic activities;Neuromuscular re-education;Patient/family education;Manual techniques;Passive range of motion;Dry needling    PT Next Visit Plan  f/u with MD appt; continue with soft tissue work to lumbar/hip; trunk stabilization and strengthening; bilateral hip strength    PT Home Exercise Plan  [id number]    Consulted and Agree with Plan of Care  Patient       Patient will benefit from skilled therapeutic intervention in order to improve the following deficits and impairments:  Pain, Increased fascial restricitons, Decreased mobility, Increased muscle spasms, Decreased activity tolerance, Decreased endurance, Decreased range of motion, Decreased strength  Visit Diagnosis: Muscle weakness (generalized)  Acute left-sided low back pain with left-sided sciatica     Problem List Patient Active Problem List   Diagnosis Date Noted  . CKD (chronic kidney disease), symptom management only,  stage 2 (mild) 09/24/2017  .  Encounter for Medicare annual wellness exam 06/02/2015  . Glaucoma 06/02/2015  . Vitamin D deficiency 10/21/2014  . Medication management 12/01/2013  . Hyperlipidemia   . Hypertension   . Other abnormal glucose   . Migraines   . Allergy   . Anxiety   . Depression, major, recurrent, in remission ([place])   . Lumbar stenosis     7:59 AM,02/08/18 Sherol Dade PT, DPT [place] [facility] [place]  [facility] [address], [place], [place], [address] Phone: [phone]   Fax:  [phone]  Name: Fathima Bartl MRN: [id number] Date of Birth: 28-Oct-1945

## 2018-02-11 ENCOUNTER — Other Ambulatory Visit: Payer: Self-pay | Admitting: Adult Health

## 2018-02-12 ENCOUNTER — Ambulatory Visit (INDEPENDENT_AMBULATORY_CARE_PROVIDER_SITE_OTHER): Payer: Medicare Other | Admitting: Orthopaedic Surgery

## 2018-02-12 ENCOUNTER — Encounter (INDEPENDENT_AMBULATORY_CARE_PROVIDER_SITE_OTHER): Payer: Self-pay | Admitting: Orthopaedic Surgery

## 2018-02-12 VITALS — BP 142/86 | HR 66 | Ht 63.0 in | Wt 150.0 lb

## 2018-02-12 DIAGNOSIS — M5442 Lumbago with sciatica, left side: Secondary | ICD-10-CM | POA: Diagnosis not present

## 2018-02-12 MED ORDER — HYDROCODONE-ACETAMINOPHEN 5-325 MG PO TABS: ORAL_TABLET | ORAL | 0 refills | Status: DC
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
## 2018-02-14 ENCOUNTER — Encounter: Payer: Medicare Other | Admitting: Physical Therapy

## 2018-02-23 ENCOUNTER — Ambulatory Visit (INDEPENDENT_AMBULATORY_CARE_PROVIDER_SITE_OTHER): Payer: Medicare Other | Admitting: Internal Medicine

## 2018-02-23 ENCOUNTER — Encounter: Payer: Self-pay | Admitting: Internal Medicine

## 2018-02-23 VITALS — BP 146/82 | HR 76 | Temp 97.5°F | Resp 16 | Ht 63.0 in | Wt 156.0 lb

## 2018-02-23 DIAGNOSIS — I824Z1 Acute embolism and thrombosis of unspecified deep veins of right distal lower extremity: Secondary | ICD-10-CM | POA: Diagnosis not present

## 2018-02-23 NOTE — Progress Notes (Signed)
   Subjective:    Patient ID: Rebekah Hamilton, female    DOB: [birth year], 72 y.o.   MRN: [id number]  HPI  This very nice 72 yo Sep WF with HTN, HLD, PreDM and Vit D Def who has been on ERT lonterm presents with a 2 day hx/o swelling in her RLE Medication Sig  . ALPRAZolam  0.25 MG tablet Take 1/2 to 1 tablet 2 to 3 x / day ONLY if needed for Acute Anxiety Attacks  & please try to limit to 5 days / week to avoid addiction  . VITAMIN C 500 mg 2 (two) times daily.  Marland Kitchen aspirin 325 MG tablet Take 325 mg by mouth daily.  Marland Kitchen VITAMIN D Take by mouth. Takes 10000 to 12000 units daily.  Marland Kitchen LOTRISONE cream Apply 1 application topically 2 (two) times daily.  . DULoxetine  60 MG capsule Take 1 capsule (60 mg total) by mouth daily.  Marland Kitchen estradiol0.5 MG tablet TAKE 1 TABLET BY MOUTH DAILY  . fexofenadine  180 MG tablet Take 180 mg by mouth daily.  . NORCO 5-325 MG tablet TAKE 1-2 TABS 6 6-8 PRN  . pravastatin  40 MG tablet Take 1 tablet (40 mg total) by mouth daily.  . progesterone  100 MG capsule TAKE 1 CAPSULE BY MOUTH EVERY DAY  . Rizatriptan/MAXALT 10 MG tablet TAKE 1 TABLET BY MOUTH AS NEEDED FOR MIGRAINE, MAY REPEAT DOSE IN 2 HOURS IF NEEDED   Allergies  Allergen Reactions  . Augmentin [Amoxicillin-Pot Clavulanate] Diarrhea  . Prozac [Fluoxetine Hcl] Other (See Comments)    Disoriented  . Wellbutrin [Bupropion] Other (See Comments)    Insomnia/Nervousness   Past Medical History:  Diagnosis Date  . Allergy   . Anemia   . Anxiety   . Depression   . Hyperlipidemia   . Hypertension   . Lumbar stenosis   . Migraines   . Prediabetes    Review of Systems    10 point systems review negative except as above.    Objective:   Physical Exam  BP (!) 146/82   Pulse 76   Temp (!) 97.5 F (36.4 C)   Resp 16   Ht 5\' 3"  (1.6 m)   Wt 156 lb (70.8 kg)   BMI 27.63 kg/m   HEENT - WNL. Neck - supple.  Chest - Clear equal BS. Cor - Nl HS. RRR w/o sig MGR. PP 1(+).Asymetric edema with Rt>>lt distal  leg/ankles. Neg SLR & Homan's sign MS- FROM w/o deformities.  Gait Nl. Neuro -  Nl w/o focal abnormalities.    Assessment & Plan:   1. DVT, lower extremity, distal, acute, right (HCC)  - VAS [place] LOWER EXTREMITY VENOUS (DVT); Future  -Advised stop Estradiol & Progesterone, continue LD bASA 81 mg And start Xarelto starter pack 15 mg bid x 3 weeks then 20 mg qd x 1 week.  - Discussed meds/SE's with coag precautions and advised ER evaluation if developed CP or Dyspnea.

## 2018-02-23 NOTE — Patient Instructions (Signed)
Deep Vein Thrombosis Deep vein thrombosis (DVT) is a condition in which a blood clot forms in a deep vein, such as a lower leg, thigh, or arm vein. A clot is blood that has thickened into a gel or solid. This condition is dangerous. It can lead to serious and even life-threatening complications if the clot travels to the lungs and causes a blockage (pulmonary embolism). It can also damage veins in the leg. This can result in leg pain, swelling, discoloration, and sores (post-thrombotic syndrome). What are the causes? This condition may be caused by:  A slowdown of blood flow.  Damage to a vein.  A condition that makes blood clot more easily.  What increases the risk? The following factors may make you more likely to develop this condition:  Being overweight.  Being elderly, especially over age 60.  Sitting or lying down for more than four hours.  Lack of physical activity (sedentary lifestyle).  Being pregnant, giving birth, or having recently given birth.  Taking medicines that contain estrogen.  Smoking.  A history of any of the following: ? Blood clots or blood clotting disease. ? Peripheral vascular disease. ? Inflammatory bowel disease. ? Cancer. ? Heart disease. ? Genetic conditions that affect how blood clots. ? Neurological diseases that affect the legs (leg paresis). ? Injury. ? Major or lengthy surgery. ? A central line placed inside a large vein.  What are the signs or symptoms? Symptoms of this condition include:  Swelling, pain, or tenderness in an arm or leg.  Warmth, redness, or discoloration in an arm or leg.  If the clot is in your leg, symptoms may be more noticeable or worse when you stand or walk. Some people do not have any symptoms. How is this diagnosed? This condition is diagnosed with:  A medical history.  A physical exam.  Tests, such as: ? Blood tests. These are done to see how your blood clots. ? Imaging tests. These are done to  check for clots. Tests may include:  Ultrasound.  CT scan.  MRI.  X-ray.  Venogram. For this test, X-rays are taken after a dye is injected into a vein.  How is this treated? Treatment for this condition depends on the cause, your risk for bleeding or developing more clots, and any medical conditions you have. Treatment may include:  Taking blood thinners (also called anticoagulants). These medicines may be taken by mouth, injected under the skin, or injected through an IV tube (catheter). These medicines prevent clots from forming.  Injecting medicine that dissolves blood clots into the affected vein (catheter-directed thrombolysis).  Having surgery. Surgery may be done to: ? Remove the clot. ? Place a filter in a large vein to catch blood clots before they reach the lungs.  Some treatments may be continued for up to six months. Follow these instructions at home: If you are taking an oral blood thinner:  Take the medicine exactly as told by your health care provider. Some blood thinners need to be taken at the same time every day. Do not skip a dose.  Ask your health care provider about what foods and drugs interact with the medicine.  Ask about possible side effects. General instructions  Blood thinners can cause easy bruising and difficulty stopping bleeding. Because of this, if you are taking or were given a blood thinner: ? Hold pressure over cuts for longer than usual. ? Tell your dentist and other health care providers that you are taking blood thinners before   having any procedures that can cause bleeding. ? Avoid contact sports.  Take over-the-counter and prescription medicines only as told by your health care provider.  Return to your normal activities as told by your health care provider. Ask your health care provider what activities are safe for you.  Wear compression stockings if recommended by your health care provider.  Keep all follow-up visits as told by  your health care provider. This is important. How is this prevented? To lower your risk of developing this condition again:  For 30 or more minutes every day, do an activity that: ? Involves moving your arms and legs. ? Increases your heart rate.  When traveling for longer than four hours: ? Exercise your arms and legs every hour. ? Drink plenty of water. ? Avoid drinking alcohol.  Avoid sitting or lying for a long time without moving your legs.  Stay a healthy weight.  If you are a woman who is older than age 35, avoid unnecessary use of medicines that contain estrogen.  Do not use any products that contain nicotine or tobacco, such as cigarettes and e-cigarettes. This is especially important if you take estrogen medicines. If you need help quitting, ask your health care provider.  Contact a health care provider if:  You miss a dose of your blood thinner.  You have nausea, vomiting, or diarrhea that lasts for more than one day.  Your menstrual period is heavier than usual.  You have unusual bruising. Get help right away if:  You have new or increased pain, swelling, or redness in an arm or leg.  You have numbness or tingling in an arm or leg.  You have shortness of breath.  You have chest pain.  You have a rapid or irregular heartbeat.  You feel light-headed or dizzy.  You cough up blood.  There is blood in your vomit, stool, or urine.  You have a serious fall or accident, or you hit your head.  You have a severe headache or confusion.  You have a cut that will not stop bleeding. These symptoms may represent a serious problem that is an emergency. Do not wait to see if the symptoms will go away. Get medical help right away. Call your local emergency services (911 in the [place] Do not drive yourself to the hospital. Summary  DVT is a condition in which a blood clot forms in a deep vein, such as a lower leg, thigh, or arm vein.  Symptoms can include swelling,  warmth, pain, and redness in your leg or arm.  Treatment may include taking blood thinners, injecting medicine that dissolves blood clots,wearing compression stockings, or surgery.  If you are prescribed blood thinners, take them exactly as told. This information is not intended to replace advice given to you by your health care provider. Make sure you discuss any questions you have with your health care provider. Document Released: 08/01/2005 Document Revised: 09/03/2016 Document Reviewed: 09/03/2016 Elsevier Interactive Patient Education  2018 [employer]vier Inc[employer]  

## 2018-02-26 ENCOUNTER — Ambulatory Visit (HOSPITAL_COMMUNITY)
Admission: RE | Admit: 2018-02-26 | Discharge: 2018-02-26 | Disposition: A | Discharge status: Home / Self Care | Payer: Medicare Other | Source: Ambulatory Visit | Attending: Cardiovascular Disease | Admitting: Cardiovascular Disease

## 2018-02-26 ENCOUNTER — Ambulatory Visit: Payer: Medicare Other | Attending: Orthopaedic Surgery | Admitting: Physical Therapy

## 2018-02-26 ENCOUNTER — Telehealth: Payer: Self-pay | Admitting: Physical Therapy

## 2018-02-26 DIAGNOSIS — I824Z1 Acute embolism and thrombosis of unspecified deep veins of right distal lower extremity: Secondary | ICD-10-CM

## 2018-02-26 DIAGNOSIS — M5442 Lumbago with sciatica, left side: Secondary | ICD-10-CM | POA: Insufficient documentation

## 2018-02-26 DIAGNOSIS — M6281 Muscle weakness (generalized): Secondary | ICD-10-CM | POA: Insufficient documentation

## 2018-02-26 NOTE — Telephone Encounter (Signed)
No show. Called and spoke with pt who states she forgot about her appointment. She confirmed her upcoming appointment on Wednesday.  4:33 PM,02/26/18 [place], [place] at [place]

## 2018-02-27 ENCOUNTER — Telehealth: Payer: Self-pay

## 2018-02-27 ENCOUNTER — Other Ambulatory Visit: Payer: Self-pay | Admitting: Internal Medicine

## 2018-02-27 MED ORDER — DOXYCYCLINE HYCLATE 100 MG PO CAPS: ORAL_CAPSULE | ORAL | 0 refills | Status: DC

## 2018-02-27 NOTE — Telephone Encounter (Signed)
Pt is aware via MyChart to stop the XARALTO.  Please  advise.  Per pt ABX has been sent to pharmacy DOXY t BID- 2wks.   If not better or if sxs gets worse in a wk must come in office for visit.  July 16th 2019 by DD at 10:18am

## 2018-02-28 ENCOUNTER — Encounter: Payer: Self-pay | Admitting: Physical Therapy

## 2018-02-28 ENCOUNTER — Ambulatory Visit: Payer: Medicare Other | Admitting: Physical Therapy

## 2018-02-28 DIAGNOSIS — M6281 Muscle weakness (generalized): Secondary | ICD-10-CM | POA: Diagnosis not present

## 2018-02-28 DIAGNOSIS — M5442 Lumbago with sciatica, left side: Secondary | ICD-10-CM | POA: Diagnosis not present

## 2018-03-01 NOTE — Therapy (Signed)
[facility] [address], [place], [place], [address] Phone: [phone]   Fax:  [phone]  Physical Therapy Treatment  Patient Details  Name: Besse Miron MRN: [id number] Date of Birth: 28-Aug-1945 Referring Provider: Dr. Joni Fears   Encounter Date: 02/28/2018  PT End of Session - 02/28/18 1649    Visit Number  7    Date for PT Re-Evaluation  03/14/18    Authorization Type  Medicare; [employer]    PT Start Time  1619    PT Stop Time  1700    PT Time Calculation (min)  41 min    Activity Tolerance  Patient tolerated treatment well;No increased pain    Behavior During Therapy  WFL for tasks assessed/performed       Past Medical History:  Diagnosis Date  . Allergy   . Anemia   . Anxiety   . Depression   . Hyperlipidemia   . Hypertension   . Lumbar stenosis   . Migraines   . Prediabetes     Past Surgical History:  Procedure Laterality Date  . BREAST LUMPECTOMY    . CHOLECYSTECTOMY  1999    There were no vitals filed for this visit.  Subjective Assessment - 02/28/18 1621    Subjective  Pt reports that things have been crazy with her Rt ankle. She is being treated for cellulitis currently. Her Lt hip region will bother her in the mornings, but this doesn't last long once she gets up and moving.     Pertinent History  Scoliosis and Stenosis    Patient Stated Goals  ways to alleviate pain; possible surgery on L5, S1;     Currently in Pain?  No/denies    Pain Onset  1 to 4 weeks ago         [facility] PT Assessment - 02/28/18 0001      Assessment   Medical Diagnosis  M54.5 Acute midline low back pain, with sciatica presence unspecified    Onset Date/Surgical Date  01/02/18    Prior Therapy  none      Precautions   Precautions  None      Restrictions   Weight Bearing Restrictions  No      Home Environment   Living Environment  Private residence      Prior Function   Level of  Independence  Independent    Vocation  Part time employment    Leisure  walking but back limits      Cognition   Overall Cognitive Status  Within Functional Limits for tasks assessed      Observation/Other Assessments   Focus on Therapeutic Outcomes (FOTO)   42% limitation      Posture/Postural Control   Posture/Postural Control  Postural limitations    Postural Limitations  Rounded Shoulders;Forward head    Posture Comments  scoliosis; decreased weight on left      ROM / Strength   AROM / PROM / Strength  Strength      AROM   Overall AROM Comments  lumbar limited by 25%      Strength   Strength Assessment Site  Hip    Right/Left Hip  Right;Left    Right Hip Flexion  5/5    Right Hip Extension  4/5    Right Hip ABduction  3/5    Right Hip ADduction  --    Left Hip Flexion  5/5    Left Hip Extension  4/5    Left Hip ABduction  3/5    Left Hip ADduction  --      Palpation   Palpation comment  tenderness along lumbar paraspinals Lt and Rt       Special Tests    Special Tests  --    Hip Special Tests   Trendelenberg Test      Straight Leg Raise   Findings  --    Side   --    Comment  --      Trendelenburg Test   Side  Left    Comments  right hip drops      Transfers   Transfers  Not assessed                   [facility] Adult PT Treatment/Exercise - 03/01/18 0001      Transfers   Transfers  Not assessed      Posture/Postural Control   Posture/Postural Control  Postural limitations    Postural Limitations  Rounded Shoulders;Forward head    Posture Comments  scoliosis; decreased weight on left      Knee/Hip Exercises: Seated   Clamshell with TheraBand  Green x15 reps each     Other Seated Knee/Hip Exercises  seated adductor squeeze and BUE press into bolster with coordinated breathing x20 reps     Marching  Both;1 set;15 reps    Marching Limitations  abdominal bracing and breathing coordination      Knee/Hip Exercises: Supine   Other Supine  Knee/Hip Exercises  isometric hip flexion press with abdominal brace x10 reps each side     Other Supine Knee/Hip Exercises  BLE raise at 90/90 from bolster x10 reps; hooklying abdominal brace with alternating UE flexion using red TB x15 reps       Knee/Hip Exercises: Sidelying   Hip ABduction  Both;1 set;10 reps      Manual Therapy   Soft tissue mobilization  STM lumbar paraspinals Lt gluteals              PT Education - 02/28/18 1656    Education Details  technique with therex; encouraged pt to ease back into HEP    Person(s) Educated  Patient    Methods  Explanation;Verbal cues    Comprehension  Verbalized understanding;Returned demonstration       PT Short Term Goals - 02/05/18 1642      PT SHORT TERM GOAL #1   Title  independent with initial HEP and understand what community resources are available    Time  4    Period  Weeks    Status  Achieved      PT SHORT TERM GOAL #2   Title  Pain in left leg decreased >/= 25%    Baseline  10-15%    Time  4    Period  Weeks    Status  Partially Met        PT Long Term Goals - 01/17/18 6283      PT LONG TERM GOAL #1   Title  independent with HEP including water exercise and possible gym    Time  8    Period  Weeks    Status  New    Target Date  03/14/18      PT LONG TERM GOAL #2   Title  walking with improved weight on left leg due to increased trunk strength for 15 minutes for 2 times per week    Time  8  Period  Weeks    Status  New    Target Date  03/14/18      PT LONG TERM GOAL #3   Title  pain with daily activties in left leg decreased >/= 50% due to improved posture and strength    Time  8    Period  Weeks    Status  New    Target Date  03/14/18      PT LONG TERM GOAL #4   Title  FOTO score </= 30% limitation    Time  8    Period  Weeks    Status  New    Target Date  03/14/18            Plan - 02/28/18 1657    Clinical Impression Statement  Pt arrived after 3 weeks off of PT due to  issues with RLE swelling of unknown origin. Despite her time away from therapy, her strength has remained the same and her LLE symptoms have decreased slightly. Session focused on improving deep abdominal strength and endurance as well as discussing pt's current HEP to encourage continuation of these at home. Pt reports no pain during or following today's session. She would continue to benefit from skilled PT to further address remaining muscle spasm, improve hip strength and increase deep abdominal strength and control with activity.    Rehab Potential  Excellent    Clinical Impairments Affecting Rehab Potential  None pertinent    PT Frequency  2x / week    PT Duration  8 weeks    PT Treatment/Interventions  Cryotherapy;Electrical Stimulation;Moist Heat;Traction;Therapeutic exercise;Therapeutic activities;Neuromuscular re-education;Patient/family education;Manual techniques;Passive range of motion;Dry needling    PT Next Visit Plan  continue with soft tissue work to lumbar/hip; trunk stabilization and strengthening; bilateral hip strength    PT Home Exercise Plan  [id number]    Consulted and Agree with Plan of Care  Patient       Patient will benefit from skilled therapeutic intervention in order to improve the following deficits and impairments:  Pain, Increased fascial restricitons, Decreased mobility, Increased muscle spasms, Decreased activity tolerance, Decreased endurance, Decreased range of motion, Decreased strength  Visit Diagnosis: Muscle weakness (generalized)  Acute left-sided low back pain with left-sided sciatica     Problem List Patient Active Problem List   Diagnosis Date Noted  . CKD (chronic kidney disease), symptom management only, stage 2 (mild) 09/24/2017  . Encounter for Medicare annual wellness exam 06/02/2015  . Glaucoma 06/02/2015  . Vitamin D deficiency 10/21/2014  . Medication management 12/01/2013  . Hyperlipidemia   . Hypertension   . Other abnormal glucose    . Migraines   . Allergy   . Anxiety   . Depression, major, recurrent, in remission ([place])   . Lumbar stenosis     7:59 AM,03/01/18 Sherol Dade PT, DPT [place] [facility] [place]  [facility] [address] [place], [place], [address] Phone: [phone]   Fax:  [phone]  Name: Jaeleigh Monaco MRN: [id number] Date of Birth: 03/03/1946

## 2018-03-07 NOTE — Progress Notes (Signed)
Assessment and Plan:  Jeyda was seen today for follow-up.  Diagnoses and all orders for this visit:  Localized swelling of lower extremity Presentation today suggests vasculitis rather than infectious; will check labs, initiate prednisone/lasix for pitting edema, follow up in 10 days. Advised to monitor closely and call immediately if suddenly worsening prior to then.  -     CBC with Differential/Platelet -     C3 and C4 -     C-reactive protein -     Cyclic citrul peptide antibody, IgG -     Complement, total -     Anti-DNA antibody, double-stranded -     Anti-Smith antibody -     COMPLETE METABOLIC PANEL WITH GFR  Other orders -     predniSONE (DELTASONE) 20 MG tablet; 3 tablets daily with food for 3 days, 2 tabs daily for 3 days, 1 tab a day for 5 days. -     furosemide (LASIX) 20 MG tablet; Take 1 tablet (20 mg total) by mouth daily.  Further disposition pending results of labs. Discussed med's effects and SE's.   Over 15 minutes of exam, counseling, chart review, and critical decision making was performed.   Future Appointments  Date Time Provider [place]  03/13/2018  8:45 AM Sherol Dade, [place] [facility]  03/26/2018  8:15 AM Garald Balding, MD [facility] None  04/02/2018  9:00 AM Liane Comber, NP [facility] None    ------------------------------------------------------------------------------------------------------------------   HPI BP 126/68   Pulse 72   Temp (!) 97.5 F (36.4 C)   Ht 5\' 3"  (1.6 m)   Wt 154 lb (69.9 kg)   SpO2 98%   BMI 27.28 kg/m   72 y.o.female presents for recheck of RLE after she presented on 02/23/2018 with acute edema; DVT was strongly suspected and xarelto starter pack was given and vascular US was ordered, but returned negative. There was concern for possible cellulitis, and doxycycline 100 mg BID 10 day course was initiated. The patient resents with continued localised pitting edema to right lower leg, primarily from ankle to  mid calf, worse on medial aspect with some texturing to skin. She also has erythematous scattered rash. She reports edema becomes worse later in the day, and become pruritic occasionally. No improvement with antibiotic. Denies other symptoms, pain, fever/chills/fatigue, myalgias, arthralgias or known insect bites.   She has no personal or family hx of autoimmune disease.    Past Medical History:  Diagnosis Date  . Allergy   . Anemia   . Anxiety   . Depression   . Hyperlipidemia   . Hypertension   . Lumbar stenosis   . Migraines   . Prediabetes      Allergies  Allergen Reactions  . Augmentin [Amoxicillin-Pot Clavulanate] Diarrhea  . Prozac [Fluoxetine Hcl] Other (See Comments)    Disoriented  . Wellbutrin [Bupropion] Other (See Comments)    Insomnia/Nervousness    Current Outpatient Medications on File Prior to Visit  Medication Sig  . ALPRAZolam (XANAX) 0.25 MG tablet Take 1/2 to 1 tablet 2 to 3 x / day ONLY if needed for Acute Anxiety Attacks  & please try to limit to 5 days / week to avoid addiction  . Ascorbic Acid (VITAMIN C PO) 500 mg 2 (two) times daily.  Marland Kitchen aspirin 325 MG tablet Take 325 mg by mouth daily.  . Cholecalciferol (VITAMIN D PO) Take by mouth. Takes 10000 to 12000 units daily.  . clotrimazole-betamethasone (LOTRISONE) cream Apply 1 application topically 2 (  two) times daily.  Marland Kitchen doxycycline (VIBRAMYCIN) 100 MG capsule Take 1 capsule 2 x /day with food for infection  . DULoxetine (CYMBALTA) 60 MG capsule Take 1 capsule (60 mg total) by mouth daily.  Marland Kitchen estradiol (ESTRACE) 0.5 MG tablet TAKE 1 TABLET BY MOUTH DAILY  . fexofenadine (ALLEGRA) 180 MG tablet Take 180 mg by mouth daily.  Marland Kitchen HYDROcodone-acetaminophen (NORCO/VICODIN) 5-325 MG tablet TAKE 1-2 TABS 6 6-8 PRN  . HYDROcodone-acetaminophen (NORCO/VICODIN) 5-325 MG tablet 1-2 TABLETS EVERY 6-8 HOURS AS NEEDED  . pravastatin (PRAVACHOL) 40 MG tablet Take 1 tablet (40 mg total) by mouth daily.  . progesterone  (PROMETRIUM) 100 MG capsule TAKE 1 CAPSULE BY MOUTH EVERY DAY  . rizatriptan (MAXALT) 10 MG tablet TAKE 1 TABLET BY MOUTH AS NEEDED FOR MIGRAINE, MAY REPEAT DOSE IN 2 HOURS IF NEEDED   No current facility-administered medications on file prior to visit.     ROS: all negative except above.   Physical Exam:  BP 126/68   Pulse 72   Temp (!) 97.5 F (36.4 C)   Ht 5\' 3"  (1.6 m)   Wt 154 lb (69.9 kg)   SpO2 98%   BMI 27.28 kg/m   General Appearance: Well nourished, in no apparent distress. Eyes: conjunctiva no swelling or erythema ENT/Mouth: Hearing normal.  Neck: Supple, thyroid normal.  Respiratory: Respiratory effort normal, BS equal bilaterally without rales, rhonchi, wheezing or stridor.  Cardio: RRR with no MRGs. Brisk peripheral pulses without edema.  Abdomen: Soft, + BS.  Non tender, no guarding, rebound, hernias, masses. Lymphatics: Non tender without lymphadenopathy.  Musculoskeletal: Full ROM, 5/5 strength, normal gait.  Skin: Warm, dry; she has localized pitting edema to right lower leg, from ankle to mid calf sparing foot; worse to medial aspect with orange peel texture to skin present to this area; she also has scattered erythematous rash -? petichial Neuro: Cranial nerves intact. Normal muscle tone, no cerebellar symptoms. Sensation intact.  Psych: Awake and oriented X 3, normal affect, Insight and Judgment appropriate.     Izora Ribas, NP 11:54 AM [facility]

## 2018-03-08 ENCOUNTER — Ambulatory Visit (INDEPENDENT_AMBULATORY_CARE_PROVIDER_SITE_OTHER): Payer: Medicare Other | Admitting: Adult Health

## 2018-03-08 ENCOUNTER — Encounter: Payer: Self-pay | Admitting: Adult Health

## 2018-03-08 VITALS — BP 126/68 | HR 72 | Temp 97.5°F | Ht 63.0 in | Wt 154.0 lb

## 2018-03-08 DIAGNOSIS — M7989 Other specified soft tissue disorders: Secondary | ICD-10-CM

## 2018-03-08 MED ORDER — FUROSEMIDE 20 MG PO TABS: 20.0000 mg | ORAL_TABLET | Freq: Every day | ORAL | 11 refills | Status: DC

## 2018-03-08 MED ORDER — PREDNISONE 20 MG PO TABS: ORAL_TABLET | ORAL | 0 refills | Status: DC

## 2018-03-08 NOTE — Patient Instructions (Signed)
Vasculitis Vasculitis is swelling (inflammation) of the blood vessels. With vasculitis, the blood vessels can become thick, narrow, scarred, or weak, and enough blood may not be able to flow through them. This can cause damage to the muscles, kidneys, lungs, brain, and other parts of the body. There are many types of vasculitis. Some last only a short time while others last a long time. What are the causes? The exact cause is unknown, but vasculitis can develop when the body's immune system attacks its own blood vessels. This attack can be caused by:  An infection.  An immune system disease, such as lupus, rheumatoid arthritis, or scleroderma.  An allergic reaction to a medicine.  Cancer that affects blood cells, such as leukemia and lymphoma.  What increases the risk?  Being a smoker.  Being under stress.  Having a physical injury. What are the signs or symptoms? Symptoms vary depending on the type of vasculitis you have. Symptoms that are common to all types of vasculitis include:  Fever.  Poor appetite.  Weight loss.  Feeling very tired.  Having aches and pains.  Weakness.  Numbness in an area of your body.  Symptoms for specific types of vasculitis include:  Skin problems, such as sores, spots, or rashes.  Trouble seeing.  Trouble breathing.  Blood in your urine.  Headaches.  Stomach pain.  Stuffy or bloody nose.  How is this diagnosed? Your health care provider will ask about your symptoms and do a physical exam. You may have tests done, such as:  A complete blood count (CBC).  Erythrocyte sedimentation, also called sed rate test.  C-reactive protein (CRP).  Antineutrophil cytoplasmic antibodies (ANCA).  A urine test.  A biopsy of a blood vessel.  A nerve conduction study.  Imaging tests, such as: ? X-rays. ? A CT scan. ? An ultrasound. ? An MRI. ? Angiography.  How is this treated? Treatment will depend on the type of vasculitis you  have and how severe it is. Sometimes treatment is not needed. Treatment often includes:  Medicines.  Physical therapy or occupational therapy. This helps strengthen muscles that were weakened by the disease.  You will need to see your health care provider while you are being treated. During follow-up visits, your health care provider will:  Perform blood tests and bone density tests.  Check your blood pressure and blood sugar.  Check for side effects of any medicines you are taking.  Vasculitis cannot always be cured. Sometimes symptoms go away but the disease does not (the disease goes in remission). If symptoms return, increased treatment may be needed. Follow these instructions at home:  Take medicines only as directed by your health care provider.  Keep all follow-up visits as directed by your health care provider. This is important.  Exercise. Talk with your health care provider about what exercises are okay for you to do. Usually exercises that increase your heart rate (aerobic exercise), such as walking, are recommended. Aerobic exercise helps control your blood pressure and prevent bone loss.  Follow a healthy diet. Include healthy sources of protein, fruits, vegetables, and whole grains in your diet.  Learn as much as you can about vasculitis, and consider joining a support group. Understanding your condition and talking with others who have it may help you cope. Talk with your health care provider if you feel stressed, anxious, or depressed. Contact a health care provider if:  Your symptoms return, or you have new symptoms.  Your fever, fatigue, headache, or  weight loss gets worse.  You have signs of infection, such as fever, warmth, tenderness, redness, or swelling. Get help right away if:  Your vision gets worse.  Your pain does not go away, even after you take pain medicine.  You have chest or stomach pain.  You have trouble breathing.  One side of your face or  body suddenly becomes weak or numb.  Your nose bleeds.  There is blood in your urine. This information is not intended to replace advice given to you by your health care provider. Make sure you discuss any questions you have with your health care provider. Document Released: 05/28/2009 Document Revised: 01/07/2016 Document Reviewed: 09/25/2013 Elsevier Interactive Patient Education  [employer].

## 2018-03-12 LAB — COMPLETE METABOLIC PANEL WITH GFR
AG Ratio: 2.1 (calc) (ref 1.0–2.5)
ALBUMIN MSPROF: 4.2 g/dL (ref 3.6–5.1)
ALT: 15 U/L (ref 6–29)
AST: 18 U/L (ref 10–35)
Alkaline phosphatase (APISO): 51 U/L (ref 33–130)
BUN: 16 mg/dL (ref 7–25)
CALCIUM: 9.5 mg/dL (ref 8.6–10.4)
CO2: 29 mmol/L (ref 20–32)
CREATININE: 0.81 mg/dL (ref 0.60–0.93)
Chloride: 106 mmol/L (ref 98–110)
GFR, EST NON AFRICAN AMERICAN: 73 mL/min/{1.73_m2} (ref >=60)
GFR, Est African American: 85 mL/min/{1.73_m2} (ref >=60)
GLUCOSE: 93 mg/dL (ref 65–99)
Globulin: 2 g/dL (calc) (ref 1.9–3.7)
Potassium: 4.3 mmol/L (ref 3.5–5.3)
SODIUM: 142 mmol/L (ref 135–146)
Total Bilirubin: 0.5 mg/dL (ref 0.2–1.2)
Total Protein: 6.2 g/dL (ref 6.1–8.1)

## 2018-03-12 LAB — CBC WITH DIFFERENTIAL/PLATELET
BASOS PCT: 0.8 %
Basophils Absolute: 69 cells/uL (ref 0–200)
Eosinophils Absolute: 181 cells/uL (ref 15–500)
Eosinophils Relative: 2.1 %
HCT: 40.4 % (ref 35.0–45.0)
HEMOGLOBIN: 14 g/dL (ref 11.7–15.5)
Lymphs Abs: 2683 cells/uL (ref 850–3900)
MCH: 30.8 pg (ref 27.0–33.0)
MCHC: 34.7 g/dL (ref 32.0–36.0)
MCV: 89 fL (ref 80.0–100.0)
MONOS PCT: 8.6 %
MPV: 10.4 fL (ref 7.5–12.5)
NEUTROS ABS: 4928 {cells}/uL (ref 1500–7800)
Neutrophils Relative %: 57.3 %
Platelets: 341 10*3/uL (ref 140–400)
RBC: 4.54 10*6/uL (ref 3.80–5.10)
RDW: 12.3 % (ref 11.0–15.0)
Total Lymphocyte: 31.2 %
WBC mixed population: 740 cells/uL (ref 200–950)
WBC: 8.6 10*3/uL (ref 3.8–10.8)

## 2018-03-12 LAB — C-REACTIVE PROTEIN: CRP: 0.4 mg/L (ref <8.0)

## 2018-03-12 LAB — C3 AND C4
C3 COMPLEMENT: 118 mg/dL (ref 83–193)
C4 Complement: 28 mg/dL (ref 15–57)

## 2018-03-12 LAB — ANTI-DNA ANTIBODY, DOUBLE-STRANDED: ds DNA Ab: <1 IU/mL

## 2018-03-12 LAB — ANTI-SMITH ANTIBODY: ENA SM AB SER-ACNC: NEGATIVE AI

## 2018-03-12 LAB — CYCLIC CITRUL PEPTIDE ANTIBODY, IGG: Cyclic Citrullin Peptide Ab: <16 UNITS

## 2018-03-12 LAB — COMPLEMENT, TOTAL: Compl, Total (CH50): >60 U/mL — ABNORMAL HIGH (ref 31–60)

## 2018-03-13 ENCOUNTER — Encounter: Payer: Self-pay | Admitting: Physical Therapy

## 2018-03-13 ENCOUNTER — Ambulatory Visit: Payer: Medicare Other | Admitting: Physical Therapy

## 2018-03-13 DIAGNOSIS — M5442 Lumbago with sciatica, left side: Secondary | ICD-10-CM | POA: Diagnosis not present

## 2018-03-13 DIAGNOSIS — M6281 Muscle weakness (generalized): Secondary | ICD-10-CM

## 2018-03-13 NOTE — Therapy (Addendum)
[facility] [address] [place], [place], [address] Phone: [phone]   Fax:  [phone]  Physical Therapy Treatment/Discharge  Patient Details  Name: Rebekah Hamilton MRN: [id number] Date of Birth: 03-24-1946 Referring Provider: Joni Fears, MD   Encounter Date: 03/13/2018  PT End of Session - 03/13/18 0900    Visit Number  8    Date for PT Re-Evaluation  03/14/18    Authorization Type  Medicare; [employer]    PT Start Time  0845    PT Stop Time  0930    PT Time Calculation (min)  45 min    Activity Tolerance  Patient tolerated treatment well;No increased pain    Behavior During Therapy  WFL for tasks assessed/performed       Past Medical History:  Diagnosis Date  . Allergy   . Anemia   . Anxiety   . Depression   . Hyperlipidemia   . Hypertension   . Lumbar stenosis   . Migraines   . Prediabetes     Past Surgical History:  Procedure Laterality Date  . BREAST LUMPECTOMY    . CHOLECYSTECTOMY  1999    There were no vitals filed for this visit.  Subjective Assessment - 03/13/18 0848    Subjective  Pt reports that she has been on a steroid which has made her feel bad and has had trouble sleeping. She has not been able to complete her exercises because of this. Overall, her back does not bother her too much as long as she is able to reposition. She has had some issues with not being able to get to the bathroom in time, so she is going to speak with her referring physician about a possible referral for pelvic floor.     Pertinent History  Scoliosis and Stenosis    Patient Stated Goals  ways to alleviate pain; possible surgery on L5, S1;     Currently in Pain?  No/denies    Pain Onset  1 to 4 weeks ago         [facility] PT Assessment - 03/13/18 0001      Assessment   Medical Diagnosis  M54.5 Acute midline low back pain, with sciatica presence unspecified    Referring Provider  Joni Fears,  MD    Onset Date/Surgical Date  01/02/18    Next MD Visit  03/26/18    Prior Therapy  none      Precautions   Precautions  None      Restrictions   Weight Bearing Restrictions  No      Balance Screen   Has the patient fallen in the past 6 months  No    Has the patient had a decrease in activity level because of a fear of falling?   No    Is the patient reluctant to leave their home because of a fear of falling?   No      Home Film/video editor residence      Prior Function   Level of Independence  Independent    Vocation  Part time employment    Leisure  walking but back limits      Cognition   Overall Cognitive Status  Within Functional Limits for tasks assessed      Observation/Other Assessments   Focus on Therapeutic Outcomes (FOTO)   31% limited       Posture/Postural Control   Posture/Postural Control  Postural limitations    Postural Limitations  Rounded Shoulders;Forward head    Posture Comments  scoliosis; decreased weight on left      AROM   Overall AROM Comments  active lumbar flexion, extension and rotation limited 25% but pain free      Strength   Right Hip Flexion  5/5    Right Hip Extension  4/5    Right Hip ABduction  3+/5    Right Hip ADduction  --    Left Hip Flexion  5/5    Left Hip Extension  5/5    Left Hip ABduction  3/5    Left Hip ADduction  --      Palpation   Palpation comment  --      Special Tests    Special Tests  --    Hip Special Tests   Trendelenberg Test      Straight Leg Raise   Findings  --    Side   --    Comment  --      Trendelenburg Test   Side  Left    Comments  right hip drops      Transfers   Transfers  Not assessed                   [facility] Adult PT Treatment/Exercise - 03/13/18 0001      Knee/Hip Exercises: Standing   Other Standing Knee Exercises  Lt and Rt hip hike x10 reps each      Knee/Hip Exercises: Sidelying   Hip ABduction  Left;1 set;10 reps    Hip ABduction  Limitations  2# ankle weight    Clams  Lt and Rt x10 reps with UE press into table for additional trunk stablity              PT Education - 03/13/18 0930    Education Details  technique with therex and exercise progressions    Person(s) Educated  Patient    Methods  Explanation;Handout;Verbal cues;Demonstration    Comprehension  Verbalized understanding;Returned demonstration       PT Short Term Goals - 03/13/18 0932      PT SHORT TERM GOAL #1   Title  independent with initial HEP and understand what community resources are available    Time  4    Period  Weeks    Status  Achieved      PT SHORT TERM GOAL #2   Title  Pain in left leg decreased >/= 25%    Baseline  atleast 40%    Time  4    Period  Weeks    Status  Achieved        PT Long Term Goals - 03/13/18 0932      PT LONG TERM GOAL #1   Title  independent with HEP including water exercise and possible gym    Time  8    Period  Weeks    Status  Achieved      PT LONG TERM GOAL #2   Title  walking with improved weight on left leg due to increased trunk strength for 15 minutes for 2 times per week    Baseline  not recently due to issues with RLE swelling, but was doing this prior    Time  8    Period  Weeks    Status  Achieved      PT LONG TERM GOAL #3   Title  pain with daily activties in  left leg decreased >/= 50% due to improved posture and strength    Baseline  40%    Time  8    Period  Weeks    Status  Partially Met      PT LONG TERM GOAL #4   Title  FOTO score </= 30% limitation    Baseline  31% limited     Time  8    Period  Weeks    Status  Partially Met            Plan - 03/13/18 0931    Clinical Impression Statement  Pt was discharged this visit having met all but 2 of her short and long term goals since beginning PT. She has improved LE strength with remaining weakness of the hip abductors and noted trendelenburg on the Lt hip with standing activity. She does have limitations in  lumbar active ROM but this is pain free during today's re-evaluation. Pt has been having issues with RLE swelling recently, and has been undergoing testing for this. Due to her current focus on this, she has been unable to consistently keep up with her HEP. Therapist reviewed pt's HEP and made additions to address remaining limitations in strength that she can continue to complete on her own once she is finished with her steroid medication. Pt demonstrated good understanding at this time and plans to complete HEP moving forward.     Rehab Potential  Excellent    Clinical Impairments Affecting Rehab Potential  None pertinent    PT Frequency  2x / week    PT Duration  8 weeks    PT Treatment/Interventions  Cryotherapy;Electrical Stimulation;Moist Heat;Traction;Therapeutic exercise;Therapeutic activities;Neuromuscular re-education;Patient/family education;Manual techniques;Passive range of motion;Dry needling    PT Next Visit Plan  d/c with HEP    PT Home Exercise Plan  [email or web address]    Consulted and Agree with Plan of Care  Patient       Patient will benefit from skilled therapeutic intervention in order to improve the following deficits and impairments:  Pain, Increased fascial restricitons, Decreased mobility, Increased muscle spasms, Decreased activity tolerance, Decreased endurance, Decreased range of motion, Decreased strength  Visit Diagnosis: Muscle weakness (generalized)  Acute left-sided low back pain with left-sided sciatica     Problem List Patient Active Problem List   Diagnosis Date Noted  . CKD (chronic kidney disease), symptom management only, stage 2 (mild) 09/24/2017  . Encounter for Medicare annual wellness exam 06/02/2015  . Glaucoma 06/02/2015  . Vitamin D deficiency 10/21/2014  . Medication management 12/01/2013  . Hyperlipidemia   . Hypertension   . Other abnormal glucose   . Migraines   . Allergy   . Anxiety   . Depression, major, recurrent, in remission ([place])   .  Lumbar stenosis      PHYSICAL THERAPY DISCHARGE SUMMARY  Visits from Start of Care: 8  Current functional level related to goals / functional outcomes: See above for more details    Remaining deficits: See above for more details    Education / Equipment: See above for more details   Plan: Patient agrees to discharge.  Patient goals were met. Patient is being discharged due to meeting the stated rehab goals.  ?????         9:51 AM,03/13/18 Sherol Dade PT, [place] [facility] [address], [place], [place], [address] Phone: [phone]   Fax:  [phone]  Name: Rebekah Hamilton MRN: [id number] Date of Birth: September 21, 1945   *addendum made to adjust eval charge to re-eval charge.  9:58 AM,03/13/18 [place], [place] [facility] [place]

## 2018-03-13 NOTE — Patient Instructions (Signed)
Access Code: [email or web address]  URL: [email or web address]  Date: 03/13/2018  Prepared by: Elly Modena   Exercises  Supine Double Knee to Chest - 15 reps - 3 sets - 1x daily - 7x weekly  Supine Lower Trunk Rotation - 15 reps - 3 sets - 1x daily - 7x weekly  Supine Bridge - 10 reps - 3 sets - 1x daily - 7x weekly  Sidelying Hip Abduction - 10 reps - 2 sets - 1x daily - 7x weekly  Clamshell with Resistance - 10 reps - 2 sets - 1x daily - 7x weekly  Hooklying Isometric Hip Flexion - 10 reps - 3 sets - 1x daily - 7x weekly  Standing Hip Hiking - 10 reps - 3 sets - 1x daily - 7x weekly  Standing Hip Abduction - 10 reps - 3 sets - 1x daily - 7x weekly    [facility] [address] [place], [place] [address] Phone # [phone] Fax [phone]

## 2018-03-15 ENCOUNTER — Ambulatory Visit: Payer: Self-pay | Admitting: Adult Health

## 2018-03-26 ENCOUNTER — Ambulatory Visit (INDEPENDENT_AMBULATORY_CARE_PROVIDER_SITE_OTHER): Payer: Medicare Other | Admitting: Orthopaedic Surgery

## 2018-03-26 ENCOUNTER — Encounter (INDEPENDENT_AMBULATORY_CARE_PROVIDER_SITE_OTHER): Payer: Self-pay | Admitting: Orthopaedic Surgery

## 2018-03-26 VITALS — BP 148/87 | HR 72 | Ht 63.0 in | Wt 150.0 lb

## 2018-03-26 DIAGNOSIS — M48061 Spinal stenosis, lumbar region without neurogenic claudication: Secondary | ICD-10-CM | POA: Diagnosis not present
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
## 2018-03-27 ENCOUNTER — Other Ambulatory Visit: Payer: Self-pay | Admitting: *Deleted

## 2018-03-27 ENCOUNTER — Other Ambulatory Visit: Payer: Self-pay | Admitting: Physician Assistant

## 2018-03-27 MED ORDER — DULOXETINE HCL 60 MG PO CPEP: 60.0000 mg | ORAL_CAPSULE | Freq: Every day | ORAL | 1 refills | Status: DC
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
## 2018-04-02 ENCOUNTER — Ambulatory Visit (INDEPENDENT_AMBULATORY_CARE_PROVIDER_SITE_OTHER): Payer: Medicare Other | Admitting: Adult Health

## 2018-04-02 ENCOUNTER — Encounter: Payer: Self-pay | Admitting: Adult Health

## 2018-04-02 ENCOUNTER — Other Ambulatory Visit: Payer: Self-pay | Admitting: Adult Health

## 2018-04-02 VITALS — BP 138/80 | HR 72 | Temp 97.6°F | Resp 16 | Ht 62.5 in | Wt 156.2 lb

## 2018-04-02 DIAGNOSIS — Z136 Encounter for screening for cardiovascular disorders: Secondary | ICD-10-CM | POA: Diagnosis not present

## 2018-04-02 DIAGNOSIS — N3281 Overactive bladder: Secondary | ICD-10-CM

## 2018-04-02 DIAGNOSIS — Z79899 Other long term (current) drug therapy: Secondary | ICD-10-CM | POA: Diagnosis not present

## 2018-04-02 DIAGNOSIS — T7840XS Allergy, unspecified, sequela: Secondary | ICD-10-CM

## 2018-04-02 DIAGNOSIS — M48061 Spinal stenosis, lumbar region without neurogenic claudication: Secondary | ICD-10-CM

## 2018-04-02 DIAGNOSIS — N182 Chronic kidney disease, stage 2 (mild): Secondary | ICD-10-CM | POA: Diagnosis not present

## 2018-04-02 DIAGNOSIS — F419 Anxiety disorder, unspecified: Secondary | ICD-10-CM | POA: Diagnosis not present

## 2018-04-02 DIAGNOSIS — Z6826 Body mass index (BMI) 26.0-26.9, adult: Secondary | ICD-10-CM

## 2018-04-02 DIAGNOSIS — G43009 Migraine without aura, not intractable, without status migrainosus: Secondary | ICD-10-CM

## 2018-04-02 DIAGNOSIS — I10 Essential (primary) hypertension: Secondary | ICD-10-CM | POA: Diagnosis not present

## 2018-04-02 DIAGNOSIS — H409 Unspecified glaucoma: Secondary | ICD-10-CM

## 2018-04-02 DIAGNOSIS — Z0001 Encounter for general adult medical examination with abnormal findings: Secondary | ICD-10-CM

## 2018-04-02 DIAGNOSIS — F3340 Major depressive disorder, recurrent, in remission, unspecified: Secondary | ICD-10-CM

## 2018-04-02 DIAGNOSIS — E559 Vitamin D deficiency, unspecified: Secondary | ICD-10-CM | POA: Diagnosis not present

## 2018-04-02 DIAGNOSIS — R7309 Other abnormal glucose: Secondary | ICD-10-CM

## 2018-04-02 DIAGNOSIS — F411 Generalized anxiety disorder: Secondary | ICD-10-CM

## 2018-04-02 DIAGNOSIS — E782 Mixed hyperlipidemia: Secondary | ICD-10-CM | POA: Diagnosis not present

## 2018-04-02 DIAGNOSIS — Z0000 Encounter for general adult medical examination without abnormal findings: Secondary | ICD-10-CM | POA: Diagnosis not present

## 2018-04-02 MED ORDER — RIZATRIPTAN BENZOATE 10 MG PO TABS: ORAL_TABLET | ORAL | 3 refills | Status: DC

## 2018-04-02 MED ORDER — OXYBUTYNIN CHLORIDE ER 5 MG PO TB24: 5.0000 mg | ORAL_TABLET | Freq: Every day | ORAL | 1 refills | Status: DC

## 2018-04-02 MED ORDER — ALPRAZOLAM 0.25 MG PO TABS: ORAL_TABLET | ORAL | 0 refills | Status: DC

## 2018-04-02 NOTE — Patient Instructions (Addendum)
Take oxybutynin 5 mg ER tab once daily for over active bladder; I am starting at a low dose to try to limit side effects - if this medication is working and you are tolerating well but feel you need a higher dose, please let us know.    [facility]  7 a.m.-6:30 p.m., Monday 7 a.m.-5 p.m., Tuesday-Friday Schedule an appointment by calling [phone].    Know what a healthy weight is for you (roughly BMI <25) and aim to maintain this  Aim for 7+ servings of fruits and vegetables daily  65-80+ fluid ounces of water or unsweet tea for healthy kidneys  Limit to max 1 drink of alcohol per day; avoid smoking/tobacco  Limit animal fats in diet for cholesterol and heart health - choose grass fed whenever available  Avoid highly processed foods, and foods high in saturated/trans fats  Aim for low stress - take time to unwind and care for your mental health  Aim for 150 min of moderate intensity exercise weekly for heart health, and weights twice weekly for bone health  Aim for 7-9 hours of sleep daily      When it comes to diets, agreement about the perfect plan isn't easy to find, even among the experts. Experts at the [place] [employer] developed an idea known as the Healthy Eating Plate. Just imagine a plate divided into logical, healthy portions.  The emphasis is on diet quality:  Load up on vegetables and fruits - one-half of your plate: Aim for color and variety, and remember that potatoes don't count.  Go for whole grains - one-quarter of your plate: Whole wheat, barley, wheat berries, quinoa, oats, brown rice, and foods made with them. If you want pasta, go with whole wheat pasta.  Protein power - one-quarter of your plate: Fish, chicken, beans, and nuts are all healthy, versatile protein sources. Limit red meat.  The diet, however, does go beyond the plate, offering a few other suggestions.  Use healthy plant oils, such as  olive, canola, soy, corn, sunflower and peanut. Check the labels, and avoid partially hydrogenated oil, which have unhealthy trans fats.  If you're thirsty, drink water. Coffee and tea are good in moderation, but skip sugary drinks and limit milk and dairy products to one or two daily servings.  The type of carbohydrate in the diet is more important than the amount. Some sources of carbohydrates, such as vegetables, fruits, whole grains, and beans-are healthier than others.  Finally, stay active.    Oxybutynin tablets What is this medicine? OXYBUTYNIN (ox i BYOO ti nin) is used to treat overactive bladder. This medicine reduces the amount of bathroom visits. It may also help to control wetting accidents. This medicine may be used for other purposes; ask your health care provider or pharmacist if you have questions. COMMON BRAND NAME(S): Ditropan What should I tell my health care provider before I take this medicine? They need to know if you have any of these conditions: -autonomic neuropathy -dementia -difficulty passing urine -glaucoma -intestinal obstruction -kidney disease -liver disease -myasthenia gravis -Parkinson's disease -an unusual or allergic reaction to oxybutynin, other medicines, foods, dyes, or preservatives -pregnant or trying to get pregnant -breast-feeding How should I use this medicine? Take this medicine by mouth with a glass of water. Follow the directions on the prescription label. You can take this medicine with or without food. Take your medicine at regular intervals. Do not take your medicine more  often than directed. Talk to your pediatrician regarding the use of this medicine in children. Special care may be needed. While this drug may be prescribed for children as young as 5 years for selected conditions, precautions do apply. Overdosage: If you think you have taken too much of this medicine contact a poison control center or emergency room at once. NOTE:  This medicine is only for you. Do not share this medicine with others. What if I miss a dose? If you miss a dose, take it as soon as you can. If it is almost time for your next dose, take only that dose. Do not take double or extra doses. What may interact with this medicine? -antihistamines for allergy, cough and cold -atropine -certain medicines for bladder problems like oxybutynin, tolterodine -certain medicines for Parkinson's disease like benztropine, trihexyphenidyl -certain medicines for stomach problems like dicyclomine, hyoscyamine -certain medicines for travel sickness like scopolamine -clarithromycin -erythromycin -ipratropium -medicines for fungal infections, like fluconazole, itraconazole, ketoconazole or voriconazole This list may not describe all possible interactions. Give your health care provider a list of all the medicines, herbs, non-prescription drugs, or dietary supplements you use. Also tell them if you smoke, drink alcohol, or use illegal drugs. Some items may interact with your medicine. What should I watch for while using this medicine? It may take a few weeks to notice the full benefit from this medicine. You may need to limit your intake tea, coffee, caffeinated sodas, and alcohol. These drinks may make your symptoms worse. You may get drowsy or dizzy. Do not drive, use machinery, or do anything that needs mental alertness until you know how this medicine affects you. Do not stand or sit up quickly, especially if you are an older patient. This reduces the risk of dizzy or fainting spells. Alcohol may interfere with the effect of this medicine. Avoid alcoholic drinks. Your mouth may get dry. Chewing sugarless gum or sucking hard candy, and drinking plenty of water may help. Contact your doctor if the problem does not go away or is severe. This medicine may cause dry eyes and blurred vision. If you wear contact lenses, you may feel some discomfort. Lubricating drops may  help. See your eyecare professional if the problem does not go away or is severe. Avoid extreme heat. This medicine can cause you to sweat less than normal. Your body temperature could increase to dangerous levels, which may lead to heat stroke. What side effects may I notice from receiving this medicine? Side effects that you should report to your doctor or health care professional as soon as possible: -allergic reactions like skin rash, itching or hives, swelling of the face, lips, or tongue -agitation -breathing problems -confusion -fever -flushing (reddening of the skin) -hallucinations -memory loss -pain or difficulty passing urine -palpitations -unusually weak or tired Side effects that usually do not require medical attention (report to your doctor or health care professional if they continue or are bothersome): -constipation -headache -sexual difficulties (impotence) This list may not describe all possible side effects. Call your doctor for medical advice about side effects. You may report side effects to FDA at 1-800-FDA-1088. Where should I keep my medicine? Keep out of the reach of children. Store at room temperature between 15 and 30 degrees C (59 and 86 degrees F). Protect from moisture and humidity. Throw away any unused medicine after the expiration date. NOTE: This sheet is a summary. It may not cover all possible information. If you have questions about this medicine,  talk to your doctor, pharmacist, or health care provider.  2018 Elsevier/Gold Standard (2013-10-17 10:57:08)

## 2018-04-03 ENCOUNTER — Other Ambulatory Visit: Payer: Self-pay | Admitting: Adult Health

## 2018-04-03 DIAGNOSIS — N390 Urinary tract infection, site not specified: Secondary | ICD-10-CM

## 2018-04-03 MED ORDER — NITROFURANTOIN MONOHYD MACRO 100 MG PO CAPS: 100.0000 mg | ORAL_CAPSULE | Freq: Two times a day (BID) | ORAL | 0 refills | Status: DC

## 2018-04-04 LAB — CBC WITH DIFFERENTIAL/PLATELET
BASOS ABS: 40 {cells}/uL (ref 0–200)
BASOS PCT: 0.5 %
EOS ABS: 237 {cells}/uL (ref 15–500)
Eosinophils Relative: 3 %
HEMATOCRIT: 42.8 % (ref 35.0–45.0)
Hemoglobin: 14.2 g/dL (ref 11.7–15.5)
Lymphs Abs: 2022 cells/uL (ref 850–3900)
MCH: 30.3 pg (ref 27.0–33.0)
MCHC: 33.2 g/dL (ref 32.0–36.0)
MCV: 91.5 fL (ref 80.0–100.0)
MONOS PCT: 7.7 %
MPV: 10.2 fL (ref 7.5–12.5)
NEUTROS ABS: 4993 {cells}/uL (ref 1500–7800)
Neutrophils Relative %: 63.2 %
Platelets: 268 10*3/uL (ref 140–400)
RBC: 4.68 10*6/uL (ref 3.80–5.10)
RDW: 12.4 % (ref 11.0–15.0)
Total Lymphocyte: 25.6 %
WBC mixed population: 608 cells/uL (ref 200–950)
WBC: 7.9 10*3/uL (ref 3.8–10.8)

## 2018-04-04 LAB — COMPLETE METABOLIC PANEL WITH GFR
AG Ratio: 2.2 (calc) (ref 1.0–2.5)
ALT: 16 U/L (ref 6–29)
AST: 19 U/L (ref 10–35)
Albumin: 4.3 g/dL (ref 3.6–5.1)
Alkaline phosphatase (APISO): 49 U/L (ref 33–130)
BILIRUBIN TOTAL: 0.6 mg/dL (ref 0.2–1.2)
BUN: 12 mg/dL (ref 7–25)
CALCIUM: 9.6 mg/dL (ref 8.6–10.4)
CHLORIDE: 107 mmol/L (ref 98–110)
CO2: 29 mmol/L (ref 20–32)
Creat: 0.87 mg/dL (ref 0.60–0.93)
GFR, Est African American: 78 mL/min/{1.73_m2} (ref >=60)
GFR, Est Non African American: 67 mL/min/{1.73_m2} (ref >=60)
GLUCOSE: 105 mg/dL — AB (ref 65–99)
Globulin: 2 g/dL (calc) (ref 1.9–3.7)
Potassium: 5.3 mmol/L (ref 3.5–5.3)
Sodium: 143 mmol/L (ref 135–146)
TOTAL PROTEIN: 6.3 g/dL (ref 6.1–8.1)

## 2018-04-04 LAB — LIPID PANEL
CHOL/HDL RATIO: 2.8 (calc) (ref <5.0)
Cholesterol: 156 mg/dL (ref <200)
HDL: 56 mg/dL (ref >50)
LDL CHOLESTEROL (CALC): 81 mg/dL
NON-HDL CHOLESTEROL (CALC): 100 mg/dL (ref <130)
Triglycerides: 91 mg/dL (ref <150)

## 2018-04-04 LAB — MICROALBUMIN / CREATININE URINE RATIO
CREATININE, URINE: 184 mg/dL (ref 20–275)
MICROALB UR: 0.9 mg/dL
MICROALB/CREAT RATIO: 5 ug/mg{creat} (ref <30)

## 2018-04-04 LAB — URINE CULTURE
MICRO NUMBER: 90990272
Result:: NO GROWTH
SPECIMEN QUALITY:: ADEQUATE

## 2018-04-04 LAB — URINALYSIS W MICROSCOPIC + REFLEX CULTURE
Bilirubin Urine: NEGATIVE
Glucose, UA: NEGATIVE
Hgb urine dipstick: NEGATIVE
KETONES UR: NEGATIVE
Nitrites, Initial: NEGATIVE
Protein, ur: NEGATIVE
SPECIFIC GRAVITY, URINE: 1.025 (ref 1.001–1.03)

## 2018-04-04 LAB — CULTURE INDICATED

## 2018-04-04 LAB — HEMOGLOBIN A1C
EAG (MMOL/L): 6.3 (calc)
HEMOGLOBIN A1C: 5.6 %{Hb} (ref <5.7)
MEAN PLASMA GLUCOSE: 114 (calc)

## 2018-04-04 LAB — TSH: TSH: 1.84 m[IU]/L (ref 0.40–4.50)

## 2018-04-04 LAB — MAGNESIUM: Magnesium: 2.1 mg/dL (ref 1.5–2.5)

## 2018-04-10 DIAGNOSIS — H2513 Age-related nuclear cataract, bilateral: Secondary | ICD-10-CM | POA: Diagnosis not present

## 2018-04-10 DIAGNOSIS — H18893 Other specified disorders of cornea, bilateral: Secondary | ICD-10-CM | POA: Diagnosis not present

## 2018-04-10 DIAGNOSIS — H25013 Cortical age-related cataract, bilateral: Secondary | ICD-10-CM | POA: Diagnosis not present

## 2018-04-10 DIAGNOSIS — H01009 Unspecified blepharitis unspecified eye, unspecified eyelid: Secondary | ICD-10-CM | POA: Diagnosis not present

## 2018-04-11 ENCOUNTER — Other Ambulatory Visit: Payer: Self-pay

## 2018-04-11 MED ORDER — PREDNISONE 20 MG PO TABS: ORAL_TABLET | ORAL | 0 refills | Status: AC

## 2018-04-11 NOTE — Telephone Encounter (Signed)
Patient is requesting another round of Prednisone. Was told to call if she needed this again. Please advise.

## 2018-05-01 ENCOUNTER — Other Ambulatory Visit: Payer: Self-pay | Admitting: Internal Medicine

## 2018-05-01 DIAGNOSIS — Z1231 Encounter for screening mammogram for malignant neoplasm of breast: Secondary | ICD-10-CM

## 2018-05-06 ENCOUNTER — Other Ambulatory Visit: Payer: Self-pay | Admitting: Adult Health

## 2018-05-08 ENCOUNTER — Ambulatory Visit (INDEPENDENT_AMBULATORY_CARE_PROVIDER_SITE_OTHER): Payer: Medicare Other

## 2018-05-08 DIAGNOSIS — N390 Urinary tract infection, site not specified: Secondary | ICD-10-CM | POA: Diagnosis not present

## 2018-05-08 NOTE — Progress Notes (Signed)
Patient presents to the office for a nurse visit to have a urinalysis completed. Patient has completed antibiotics for her UTI and no longer has any signs or symptoms suggesting otherwise.

## 2018-05-09 ENCOUNTER — Ambulatory Visit (INDEPENDENT_AMBULATORY_CARE_PROVIDER_SITE_OTHER): Payer: Medicare Other | Admitting: Internal Medicine

## 2018-05-09 VITALS — BP 136/80 | HR 68 | Temp 97.5°F | Resp 16 | Ht 62.5 in | Wt 158.8 lb

## 2018-05-09 DIAGNOSIS — Z79899 Other long term (current) drug therapy: Secondary | ICD-10-CM | POA: Diagnosis not present

## 2018-05-09 DIAGNOSIS — I872 Venous insufficiency (chronic) (peripheral): Secondary | ICD-10-CM

## 2018-05-09 DIAGNOSIS — Z23 Encounter for immunization: Secondary | ICD-10-CM

## 2018-05-09 LAB — URINALYSIS W MICROSCOPIC + REFLEX CULTURE
Bilirubin Urine: NEGATIVE
Glucose, UA: NEGATIVE
Hgb urine dipstick: NEGATIVE
Ketones, ur: NEGATIVE
Leukocyte Esterase: NEGATIVE
Nitrites, Initial: NEGATIVE
PROTEIN: NEGATIVE
Specific Gravity, Urine: 1.026 (ref 1.001–1.03)
pH: <=5 (ref 5.0–8.0)

## 2018-05-09 LAB — NO CULTURE INDICATED

## 2018-05-09 MED ORDER — PREDNISONE 10 MG PO TABS: ORAL_TABLET | ORAL | 0 refills | Status: DC

## 2018-05-09 MED ORDER — HYDROCHLOROTHIAZIDE 25 MG PO TABS: ORAL_TABLET | ORAL | 1 refills | Status: DC

## 2018-05-09 MED ORDER — DOXYCYCLINE HYCLATE 100 MG PO CAPS: ORAL_CAPSULE | ORAL | 0 refills | Status: DC

## 2018-05-09 NOTE — Progress Notes (Signed)
Subjective:    Patient ID: Rebekah Hamilton, female    DOB: [birth year], 72 y.o.   MRN: [id number]  HPI    This very nice 72 yo separated WF was seen in July with c/o assym LE edema in the RLE. Venous dopplers were negative & patient was reassured and advised to stop her HRT. She returns again today with similar c/o and also warm tender swelling of the distal RLE & ankle. She also relartes hx of a son & brother with hereditary hemochromatosis.   Medication Sig  . ALPRAZolam  0.25 MG t Take 1/2 to 1 tablet 2 to 3 x / day ONLY if needed  . Ascorbic Acid (VITAMIN C PO) 500 mg 2  x daily.  Marland Kitchen aspirin 325 MG tablet Take  daily.  . Cholecalciferol (VITAMIN D PO) Takes 10000 to 12000 units daily.  Marland Kitchen LOTRISONE cream Apply topically 2 (two) times daily.  . DULoxetine  60 MG capsule Take 1 capsule  daily.  Marland Kitchen estradiol  0.5 MG tablet TAKE 1 TAB DAILY  . fexofenadine  180 MG tablet Take  daily.  Marland Kitchen oxybutynin-XL) 5 MG 24 hr TAKE 1 TAB EVERY NIGHT   . pravastatin  40 MG tablet Take 1 tablet at bedtime for Cholesterol  . Progesterone 100 MG capsule Take 1 capsule daily  . rizatriptan  10 MG tablet TAKE 1 TAB  AS NEEDED FOR MIGRAINE, MAY REPEAT DOSE IN 2 HRS IF NEEDED   Allergies  Allergen Reactions  . Augmentin [Amoxicillin-Pot Clavulanate] Diarrhea  . Prozac [Fluoxetine Hcl] Other (See Comments)    Disoriented  . Wellbutrin [Bupropion] Other (See Comments)    Insomnia/Nervousness   Past Medical History:  Diagnosis Date  . Allergy   . Anemia   . Anxiety   . Depression   . Hyperlipidemia   . Hypertension   . Lumbar stenosis   . Migraines   . Prediabetes    Review of Systems     10 point systems review negative except as above.    Objective:   Physical Exam  BP 136/80   Pulse 68   Temp (!) 97.5 F (36.4 C)   Resp 16   Ht 5' 2.5" (1.588 m)   Wt 158 lb 12.8 oz (72 kg)   BMI 28.58 kg/m   HEENT - WNL. Neck - supple.  Chest - Clear equal BS. Cor - Nl HS. RRR w/o sig MGR. PP 1(+).  Mild ankle edema assym to the Rt.  MS- FROM w/o deformities.  Gait Nl. Neuro -  Nl w/o focal abnormalities. SKin - Warm tender STS about the distal 1/3 RLE. Neg Homan's & calf compression.                                   Assessment & Plan:   1. Venous insufficiency of right leg  - COMPLETE METABOLIC PANEL WITH GFR  - hydrochlorothiazide (HYDRODIURIL) 25 MG tablet; Take 1 tablet daily for  fluid  Dispense: 90 tablet; Refill: 1  2. Acute stasis dermatitis of right lower extremity  - CBC with Differential/Platelet - Iron,Total/Total Iron Binding Cap  - doxycycline (VIBRAMYCIN) 100 MG capsule; Take 1 capsule 2 x /day for 1 week,  then 1 x /day for 2 weeks - Take with food  Dispense: 28 capsule  - predniSONE (DELTASONE) 10 MG tablet; 1 tab 3 x day for 3 days, then 2 x  day for 3 days, then 1 x day for 5 days  Dispense: 20 tablet; Refill: 0  3. Hereditary hemochromatosis ([place])  - Iron,Total/Total Iron Binding Cap - Ferritin - Hemochromatosis DNA-PCR(c282y,h63d)  4. Medication management  - CBC with Differential/Platelet - Iron,Total/Total Iron Binding Cap - COMPLETE METABOLIC PANEL WITH GFR - Ferritin - Hemochromatosis DNA-PCR(c282y,h63d)

## 2018-05-09 NOTE — Patient Instructions (Signed)
Stasis Dermatitis Stasis dermatitis is a long-term (chronic) skin condition that happens when veins can no longer pump blood back to the heart (poor circulation). This condition causes a red or brown scaly rash or sores (ulcers) from the pooling of blood (stasis). This condition usually affects the lower legs. It may affect one leg or both legs. Without treatment, severe stasis dermatitis can lead to other skin conditions and infections. What are the causes? This condition is caused by poor circulation. What increases the risk? This condition is more likely to develop in people who:  Are not very active.  Stand for long periods of time.  Have veins that have become enlarged and twisted (varicose veins).  Have leg veins that are not strong enough to send blood back to the heart (venous insufficiency).  Have had a blood clot.  Have been pregnant many times.  Have had vein surgery.  Are obese.  Have heart or kidney failure.  Are 50 years of age or older.  What are the signs or symptoms? Common early symptoms of this condition include:  Swelling in your ankle or leg. This might get better overnight but be worse again in the day.  Skin that looks thin on your ankle and leg.  Brown marks that develop slowly.  Skin that is easily irritated or cracked.  Red, swollen skin.  An achy or heavy feeling after you walk or stand for long periods of time.  Pain.  Later and more severe symptoms of this condition include:  Skin that looks shiny.  Small, open sores (ulcers). These are often red or purple.  Dry, cracking skin.  Skin that feels hard.  Severe itching.  A change in the shape or color of your lower legs.  Severe pain.  Difficulty walking.  How is this diagnosed? Your health care provider may suspect this condition from your symptoms and medical history. Your health care provider will also do a physical exam. You may need to see a health care provider who  specializes in skin diseases (dermatologist). You may also have tests to confirm the diagnosis, including:  Blood tests.  Imaging studies to check blood flow (Doppler ultrasound).  Allergy tests.  How is this treated? Treatment for this condition may include medicine, such as:  Corticosteroid creams and ointments.  Non-corticosteroid medicines applied to the skin (topical).  Medicine to reduce swelling in the legs (diuretics).  Antibiotics.  Medicine to relieve itching (antihistamines).  You may also have to wear:  Compression stockings or an elastic wrap to improve circulation.  A bandage (dressing).  A wrap that contains zinc and gelatin (Unna boot).  Follow these instructions at home: Skin Care  Moisturize your skin as told by your health care provider. Do not use moisturizers with fragrance. This can irritate your skin.  Apply cool compresses to the affected areas.  Do not scratch your skin.  Do not rub your skin dry after a bath or shower. Gently pat your skin dry.  Do not use scented soaps, detergents, or perfumes. Medicines  Take or use over-the-counter and prescription medicines only as told by your health care provider.  If you were prescribed an antibiotic medicine, take or use it as told by your health care provider. Do not stop taking or using the antibiotic even if your condition starts to improve. Lifestyle  Do not stand or sit in one position for long periods of time.  Do not cross your legs when you sit.  Raise (elevate) your   legs above the level of your heart when you are sitting or lying down.  Walk as told by your health care provider. Walking increases blood flow.  Wear comfortable, loose-fitting clothing. Circulation in your legs will be worse if you wear tight pants, belts, and waistbands. General instructions  Change and remove any dressing as told by your health care provider, if this applies.  Wear compression stockings as told by  your health care provider, if this applies. These stockings help to prevent blood clots and reduce swelling in your legs.  Wear the Unna boot as told by your health care provider, if this applies.  Keep all follow-up visits as told by your health care provider. This is important. Contact a health care provider if:  Your condition does not improve with treatment.  Your condition gets worse.  You have signs of infection in the affected area. Watch for: ? Swelling. ? Tenderness. ? Redness. ? Soreness. ? Warmth.  You have a fever. Get help right away if:  You notice red streaks coming from the affected area.  Your bone or joint underneath the affected area becomes painful after the skin has healed.  The affected area turns darker.  You feel a deep pain in your leg or groin.  You are short of breath. This information is not intended to replace advice given to you by your health care provider. Make sure you discuss any questions you have with your health care provider. Document Released: 11/10/2005 Document Revised: 03/29/2016 Document Reviewed: 12/17/2014 Elsevier Interactive Patient Education  2018 [employer]  

## 2018-05-12 ENCOUNTER — Encounter: Payer: Self-pay | Admitting: Internal Medicine

## 2018-05-14 LAB — COMPLETE METABOLIC PANEL WITH GFR
AG Ratio: 1.9 (calc) (ref 1.0–2.5)
ALBUMIN MSPROF: 4.1 g/dL (ref 3.6–5.1)
ALT: 19 U/L (ref 6–29)
AST: 21 U/L (ref 10–35)
Alkaline phosphatase (APISO): 48 U/L (ref 33–130)
BILIRUBIN TOTAL: 0.5 mg/dL (ref 0.2–1.2)
BUN: 14 mg/dL (ref 7–25)
CO2: 30 mmol/L (ref 20–32)
CREATININE: 0.92 mg/dL (ref 0.60–0.93)
Calcium: 9.8 mg/dL (ref 8.6–10.4)
Chloride: 105 mmol/L (ref 98–110)
GFR, EST AFRICAN AMERICAN: 73 mL/min/{1.73_m2} (ref >=60)
GFR, Est Non African American: 63 mL/min/{1.73_m2} (ref >=60)
GLUCOSE: 134 mg/dL — AB (ref 65–99)
Globulin: 2.2 g/dL (calc) (ref 1.9–3.7)
Potassium: 4.6 mmol/L (ref 3.5–5.3)
Sodium: 144 mmol/L (ref 135–146)
TOTAL PROTEIN: 6.3 g/dL (ref 6.1–8.1)

## 2018-05-14 LAB — CBC WITH DIFFERENTIAL/PLATELET
BASOS ABS: 43 {cells}/uL (ref 0–200)
Basophils Relative: 0.5 %
EOS PCT: 3.6 %
Eosinophils Absolute: 306 cells/uL (ref 15–500)
HEMATOCRIT: 44.3 % (ref 35.0–45.0)
Hemoglobin: 14.7 g/dL (ref 11.7–15.5)
Lymphs Abs: 1870 cells/uL (ref 850–3900)
MCH: 30.1 pg (ref 27.0–33.0)
MCHC: 33.2 g/dL (ref 32.0–36.0)
MCV: 90.6 fL (ref 80.0–100.0)
MPV: 9.8 fL (ref 7.5–12.5)
Monocytes Relative: 7 %
NEUTROS ABS: 5687 {cells}/uL (ref 1500–7800)
NEUTROS PCT: 66.9 %
Platelets: 269 10*3/uL (ref 140–400)
RBC: 4.89 10*6/uL (ref 3.80–5.10)
RDW: 12.3 % (ref 11.0–15.0)
Total Lymphocyte: 22 %
WBC mixed population: 595 cells/uL (ref 200–950)
WBC: 8.5 10*3/uL (ref 3.8–10.8)

## 2018-05-14 LAB — HEMOCHROMATOSIS DNA-PCR(C282Y,H63D)

## 2018-05-14 LAB — IRON, TOTAL/TOTAL IRON BINDING CAP
%SAT: 35 % (calc) (ref 16–45)
Iron: 111 ug/dL (ref 45–160)
TIBC: 318 mcg/dL (calc) (ref 250–450)

## 2018-05-14 LAB — FERRITIN: Ferritin: 89 ng/mL (ref 16–288)

## 2018-05-28 ENCOUNTER — Ambulatory Visit: Payer: Medicare Other

## 2018-05-30 ENCOUNTER — Ambulatory Visit (INDEPENDENT_AMBULATORY_CARE_PROVIDER_SITE_OTHER): Payer: Medicare Other | Admitting: Internal Medicine

## 2018-05-30 ENCOUNTER — Encounter: Payer: Self-pay | Admitting: Internal Medicine

## 2018-05-30 VITALS — BP 112/80 | HR 60 | Temp 97.4°F | Resp 16 | Ht 62.5 in | Wt 154.8 lb

## 2018-05-30 DIAGNOSIS — I872 Venous insufficiency (chronic) (peripheral): Secondary | ICD-10-CM

## 2018-05-30 NOTE — Progress Notes (Signed)
   Subjective:    Patient ID: Rebekah Hamilton, female    DOB: 30-Jun-1946, 72 y.o.   MRN: [id number]  HPI   Patient is a very nice 72 yo Sep WF with assymetric venous insufficiency of Rt >Lt leg and treated 3 weeks ago with Doxycycline and Prednisone taper. Also, started on HCTZ for the edema. Weight is down 4 # since last month. She reports the Doxycycline caused Nausea. She relates the swelling & redness of the rt distal leg improve, but return after she stops the prednisone.   Medication Sig  . ALPRAZolam (XANAX) 0.25 MG tablet Take 1/2 to 1 tablet 2 to 3 x / day ONLY if needed for Acute Anxiety Attacks  & please try to limit to 5 days / week to avoid addiction  . VITAMIN C 500 mg 2 (two) times daily.  Marland Kitchen aspirin 325 MG tablet Take 325 mg by mouth daily.  Marland Kitchen VITAMIN D Take by mouth. Takes 10000 to 12000 units daily.  Marland Kitchen LOTRISONE cream Apply 1 application topically 2 (two) times daily.  . DULoxetine 60 MG capsule Take 1 capsule (60 mg total) by mouth daily.  Marland Kitchen estradiol  0.5 MG tablet TAKE 1 TABLET BY MOUTH DAILY  . fexofenadine  180 MG tablet Take 180 mg by mouth daily.  . hydrochlorothiazide (HYDRODIURIL) 25 MG tablet Take 1 tablet daily for  fluid  . oxybutynin (DITROPAN-XL) 5 MG 24 hr tablet TAKE 1 TABLET BY MOUTH EVERY NIGHT AT BEDTIME FOR OVERACTIVE BLADDER  . pravastatin (PRAVACHOL) 40 MG tablet Take 1 tablet at bedtime for Cholesterol  . progesterone (PROMETRIUM) 100 MG capsule Take 1 capsule daily  . rizatriptan (MAXALT) 10 MG tablet TAKE 1 TABLET BY MOUTH AS NEEDED FOR MIGRAINE, MAY REPEAT DOSE IN 2 HOURS IF NEEDED   Review of Systems    10 point systems review negative except as above.    Objective:   Physical Exam  BP 112/80   Pulse 60   Temp (!) 97.4 F (36.3 C)   Resp 16   Ht 5' 2.5" (1.588 m)   Wt 154 lb 12.8 oz (70.2 kg)   BMI 27.86 kg/m   HEENT - WNL. Neck - supple.  Chest - Clear equal BS. Cor - Nl HS. RRR w/o sig MGR. PP 1(+). Trace -1+ pedal &  edema. MS-  FROM w/o deformities.  Gait Nl. Skin - with stasis pigmentation changes more pronounced of the Rt > Lt ankles & mild redness, but no angry redness or calor.  Neuro -  Nl w/o focal abnormalities.    Assessment & Plan:   1. Venous insufficiency of right leg  2. Acute stasis dermatitis of right lower extremity  - encouraged to continue the HCTZ and elevate legs when able    .

## 2018-05-30 NOTE — Patient Instructions (Signed)
Chronic Venous Insufficiency Chronic venous insufficiency, also called venous stasis, is a condition that prevents blood from being pumped effectively through the veins in your legs. Blood may no longer be pumped effectively from the legs back to the heart. This condition can range from mild to severe. With proper treatment, you should be able to continue with an active life.  What are the causes?  Chronic venous insufficiency occurs when the vein walls become stretched, weakened, or damaged, or when valves within the vein are damaged. Some common causes of this include:   High blood pressure inside the veins (venous hypertension).  Increased blood pressure in the leg veins from long periods of sitting or standing.  Inflammation of a vein (phlebitis) that causes a blood clot to form.   What increases the risk?  The following factors may make you more likely to develop this condition:  Having a family history of this condition.  Obesity.  Living without enough physical activity or exercise (sedentary lifestyle).  Smoking.  Having a job that requires long periods of standing or sitting in one place.  Being a certain age. Women in their 99s and 64s and men in their 35s are more likely to develop this condition.  What are the signs or symptoms?  Symptoms of this condition include:  Veins that are enlarged, bulging, or twisted (varicose veins).  Skin breakdown or ulcers.  Reddened or discolored skin on the front of the leg.  Brown, smooth, tight, and painful skin just above the ankle, usually on the inside of the leg (lipodermatosclerosis).  Swelling.  How is this diagnosed?  This condition may be diagnosed based on:  Your medical history.  A physical exam.  Tests, such as: ? A procedure that creates an image of a blood vessel and nearby organs and provides information about blood flow through the blood vessel (duplex ultrasound). ? A procedure that tests blood flow  (plethysmography). ? A procedure to look at the veins using X-ray and dye (venogram).  How is this treated? The goals of treatment are to help you return to an active life and to minimize pain or disability. Treatment depends on the severity of your condition, and it may include:  Wearing compression stockings. These can help relieve symptoms and help prevent your condition from getting worse. However, they do not cure the condition.  Sclerotherapy. This is a procedure involving an injection of a material that "dissolves" damaged veins.  Surgery. This may involve: ? Removing a diseased vein (vein stripping). ? Cutting off blood flow through the vein (laser ablation surgery). ? Repairing a valve.  Follow these instructions at home:  Wear compression stockings as told by your health care provider. These stockings help to prevent blood clots and reduce swelling in your legs.  Take over-the-counter and prescription medicines only as told by your health care provider.  Stay active by exercising, walking, or doing different activities. Ask your health care provider what activities are safe for you and how much exercise you need.  Drink enough fluid to keep your urine clear or pale yellow.  Do not use any products that contain nicotine or tobacco, such as cigarettes and e-cigarettes. If you need help quitting, ask your health care provider.  Keep all follow-up visits as told by your health care provider. This is important. Contact a health care provider if:  You have redness, swelling, or more pain in the affected area.  You see a red streak or line that extends up  or down from the affected area.  You have skin breakdown or a loss of skin in the affected area, even if the breakdown is small.  You get an injury in the affected area. Get help right away if:  You get an injury and an open wound in the affected area.  You have severe pain that does not get better with medicine.  You  have sudden numbness or weakness in the foot or ankle below the affected area, or you have trouble moving your foot or ankle.  You have a fever and you have worse or persistent symptoms.  You have chest pain.  You have shortness of breath. Summary  Chronic venous insufficiency, also called venous stasis, is a condition that prevents blood from being pumped effectively through the veins in your legs.  Chronic venous insufficiency occurs when the vein walls become stretched, weakened, or damaged, or when valves within the vein are damaged.  Treatment for this condition depends on how severe your condition is, and it may involve wearing compression stockings or having a procedure.  Make sure you stay active by exercising, walking, or doing different activities. Ask your health care provider what activities are safe for you and how much exercise you need.   How to Use Compression Stockings Compression stockings are elastic socks that squeeze the legs. They help to increase blood flow to the legs, decrease swelling in the legs, and reduce the chance of developing blood clots in the lower legs. Compression stockings are often used by people who:  Are recovering from surgery.  Have poor circulation in their legs.  Are prone to getting blood clots in their legs.  Have varicose veins.  Sit or stay in bed for long periods of time.  How to use compression stockings Before you put on your compression stockings:  Make sure that they are the correct size. If you do not know your size, ask your health care provider.  Make sure that they are clean, dry, and in good condition.  Check them for rips and tears. Do not put them on if they are ripped or torn.  Put your stockings on first thing in the morning, before you get out of bed. Keep them on for as long as your health care provider advises. When you are wearing your stockings:  Keep them as smooth as possible. Do not allow them to bunch  up. It is especially important to prevent the stockings from bunching up around your toes or behind your knees.  Do not roll the stockings downward and leave them rolled down. This can decrease blood flow to your leg.  Change them right away if they become wet or dirty.  When you take off your stockings, inspect your legs and feet. Anything that does not seem normal may require medical attention. Look for:  Open sores.  Red spots.  Swelling.  Information and tips  Do not stop wearing your compression stockings without talking to your health care provider first.  Wash your stockings every day with mild detergent in cold or warm water. Do not use bleach. Air-dry your stockings or dry them in a clothes dryer on low heat.  Replace your stockings every 3-6 months.  If skin moisturizing is part of your treatment plan, apply lotion or cream at night so that your skin will be dry when you put on the stockings in the morning. It is harder to put the stockings on when you have lotion on your legs  or feet. Contact a health care provider if: Remove your stockings and seek medical care if:  You have a feeling of pins and needles in your feet or legs.  You have any new changes in your skin.  You have skin lesions that are getting worse.  You have swelling or pain that is getting worse.  Get help right away if:  You have numbness or tingling in your lower legs that does not get better right after you take the stockings off.  Your toes or feet become cold and blue.  You develop open sores or red spots on your legs that do not go away.  You see or feel a warm spot on your leg.  You have new swelling or soreness in your leg.  You are short of breath or you have chest pain for no reason.  You have a rapid or irregular heartbeat.  You feel light-headed or dizzy.

## 2018-05-30 NOTE — Progress Notes (Signed)
Patient ID: Rebekah Hamilton, female   DOB: 24-Dec-1945, 72 y.o.   MRN: [id number]

## 2018-06-03 ENCOUNTER — Encounter: Payer: Self-pay | Admitting: Internal Medicine

## 2018-06-06 ENCOUNTER — Ambulatory Visit
Admission: RE | Admit: 2018-06-06 | Discharge: 2018-06-06 | Disposition: A | Discharge status: Home / Self Care | Payer: Medicare Other | Source: Ambulatory Visit | Attending: Internal Medicine | Admitting: Internal Medicine

## 2018-06-06 DIAGNOSIS — Z1231 Encounter for screening mammogram for malignant neoplasm of breast: Secondary | ICD-10-CM | POA: Diagnosis not present

## 2018-06-08 ENCOUNTER — Other Ambulatory Visit: Payer: Self-pay | Admitting: Internal Medicine

## 2018-06-08 DIAGNOSIS — G43009 Migraine without aura, not intractable, without status migrainosus: Secondary | ICD-10-CM

## 2018-06-08 MED ORDER — VERAPAMIL HCL ER 180 MG PO TBCR: EXTENDED_RELEASE_TABLET | ORAL | 2 refills | Status: DC

## 2018-06-08 MED ORDER — TOPIRAMATE 50 MG PO TABS: ORAL_TABLET | ORAL | 2 refills | Status: DC

## 2018-06-09 ENCOUNTER — Other Ambulatory Visit: Payer: Self-pay | Admitting: Adult Health

## 2018-06-20 ENCOUNTER — Ambulatory Visit (INDEPENDENT_AMBULATORY_CARE_PROVIDER_SITE_OTHER): Payer: Medicare Other | Admitting: Orthopedic Surgery

## 2018-06-20 ENCOUNTER — Ambulatory Visit (INDEPENDENT_AMBULATORY_CARE_PROVIDER_SITE_OTHER): Payer: Medicare Other

## 2018-06-20 ENCOUNTER — Encounter (INDEPENDENT_AMBULATORY_CARE_PROVIDER_SITE_OTHER): Payer: Self-pay | Admitting: Orthopedic Surgery

## 2018-06-20 VITALS — BP 115/67 | HR 82 | Resp 16 | Ht 63.5 in | Wt 150.0 lb

## 2018-06-20 DIAGNOSIS — M25552 Pain in left hip: Secondary | ICD-10-CM | POA: Diagnosis not present

## 2018-06-20 DIAGNOSIS — M5442 Lumbago with sciatica, left side: Secondary | ICD-10-CM | POA: Diagnosis not present

## 2018-06-20 MED ORDER — METHYLPREDNISOLONE ACETATE 40 MG/ML IJ SUSP
40.0000 mg | INTRAMUSCULAR | Status: AC | PRN
  Administered 2018-06-20: 40 mg via INTRA_ARTICULAR

## 2018-06-20 MED ORDER — BUPIVACAINE HCL 0.5 % IJ SOLN
2.0000 mL | INTRAMUSCULAR | Status: AC | PRN
  Administered 2018-06-20: 2 mL via INTRA_ARTICULAR

## 2018-06-20 MED ORDER — HYDROCODONE-ACETAMINOPHEN 5-325 MG PO TABS: 1.0000 | ORAL_TABLET | Freq: Four times a day (QID) | ORAL | 0 refills | Status: DC | PRN

## 2018-06-20 MED ORDER — LIDOCAINE HCL 1 % IJ SOLN
2.0000 mL | INTRAMUSCULAR | Status: AC | PRN
  Administered 2018-06-20: 2 mL
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
## 2018-06-20 NOTE — Progress Notes (Signed)
Assessment and Plan:  Rebekah Hamilton was seen today for follow-up.  Diagnoses and all orders for this visit:  Chronic venous insufficiency/ ? R baker's cyst Edema stable/improved with hctz 25 mg daily, hasn't initiated compression hose Discussed physiology possibly contributing to edema today, additionally noted possible small baker's cyst on R that may be contributing to unilateral edema, though this was not noted on LE [place] from 02/26/2018; discussed [place] to confirm today, but she declines as symptoms well managed, no pain. She is established with Dr. Durward Fortes for ortho concerns. She will call us back if having any worsening edema despite compression therapy which she will initiate. Defer any labs to upcoming visit later this month.   Further disposition pending results of labs. Discussed med's effects and SE's.   Over 15 minutes of exam, counseling, chart review, and critical decision making was performed.   Future Appointments  Date Time Provider [place]  06/21/2018  2:30 PM Liane Comber, NP [facility] None  06/28/2018 12:30 PM Debbe Odea, PT [facility]  07/02/2018  8:15 AM Garald Balding, MD [facility] None  07/05/2018  2:00 PM Liane Comber, NP [facility] None  10/09/2018  2:30 PM Unk Pinto, MD [facility] None  04/08/2019  9:00 AM Liane Comber, NP [facility] None    ------------------------------------------------------------------------------------------------------------------   HPI BP 136/88   Pulse 65   Temp (!) 97.2 F (36.2 C)   Ht 5' 3.5" (1.613 m)   Wt 154 lb 9.6 oz (70.1 kg)   SpO2 97%   BMI 26.96 kg/m   72 y.o.female presents for follow up for persistent recurrent RLE edema. She presented on 02/23/2018 with acute edema; DVT was strongly suspected and xarelto starter pack was given and vascular US was ordered, but returned negative. There was concern for possible cellulitis, and doxycycline 100 mg BID 10 day course was initiated but patient  continued to have localized pitting edema to right lower leg, primarily from ankle to mid calf, worse on medial aspect with some texturing to skin. We also noted erythematous scattered rash at 03/08/2018 visit and vasculitis workup was completed which was unremarkable. She reports edema becomes worse later in the day, and become pruritic occasionally. No improvement with antibiotic. She presents today with symptoms improved on hctz 25 mg. No erythema or warmth noted; she does have faint brown discoloration to right inner ankle/lower leg.   Denies other symptoms, pain, fever/chills/fatigue, myalgias, arthralgias or known insect bites.   She has no personal or family hx of autoimmune disease.    Past Medical History:  Diagnosis Date  . Allergy   . Anemia   . Anxiety   . Depression   . Hyperlipidemia   . Hypertension   . Lumbar stenosis   . Migraines   . Prediabetes      Allergies  Allergen Reactions  . Egg White (Diagnostic)   . Augmentin [Amoxicillin-Pot Clavulanate] Diarrhea  . Doxycycline Nausea Only  . Prozac [Fluoxetine Hcl] Other (See Comments)    Disoriented  . Wellbutrin [Bupropion] Other (See Comments)    Insomnia/Nervousness    Current Outpatient Medications on File Prior to Visit  Medication Sig  . ALPRAZolam (XANAX) 0.25 MG tablet Take 1/2 to 1 tablet 2 to 3 x / day ONLY if needed for Acute Anxiety Attacks  & please try to limit to 5 days / week to avoid addiction  . Ascorbic Acid (VITAMIN C PO) 500 mg 2 (two) times daily.  Marland Kitchen aspirin 325 MG tablet Take 325 mg  by mouth daily.  . Cholecalciferol (VITAMIN D PO) Take by mouth. Takes 10000 to 12000 units daily.  . clotrimazole-betamethasone (LOTRISONE) cream Apply 1 application topically 2 (two) times daily.  . DULoxetine (CYMBALTA) 60 MG capsule TAKE ONE CAPSULE BY MOUTH DAILY  . estradiol (ESTRACE) 0.5 MG tablet TAKE 1 TABLET BY MOUTH DAILY  . fexofenadine (ALLEGRA) 180 MG tablet Take 180 mg by mouth daily.  .  hydrochlorothiazide (HYDRODIURIL) 25 MG tablet Take 1 tablet daily for  fluid  . HYDROcodone-acetaminophen (NORCO) 5-325 MG tablet Take 1-2 tablets by mouth every 6 (six) hours as needed for moderate pain.  Marland Kitchen oxybutynin (DITROPAN-XL) 5 MG 24 hr tablet TAKE 1 TABLET BY MOUTH EVERY NIGHT AT BEDTIME FOR OVERACTIVE BLADDER (Patient not taking: Reported on 06/20/2018)  . pravastatin (PRAVACHOL) 40 MG tablet Take 1 tablet at bedtime for Cholesterol  . progesterone (PROMETRIUM) 100 MG capsule Take 1 capsule daily  . rizatriptan (MAXALT) 10 MG tablet TAKE 1 TABLET BY MOUTH AS NEEDED FOR MIGRAINE, MAY REPEAT DOSE IN 2 HOURS IF NEEDED  . topiramate (TOPAMAX) 50 MG tablet Take 1 tablet 2 x /day at Suppertime & Bedtime for Migraine Prevention (Patient not taking: Reported on 06/20/2018)  . verapamil (CALAN-SR) 180 MG CR tablet Take 1 tablet every morning after a  Breakfast  for Migraine  Prevention (Patient not taking: Reported on 06/20/2018)   No current facility-administered medications on file prior to visit.     ROS: all negative except above.   Physical Exam:  There were no vitals taken for this visit.  General Appearance: Well nourished, in no apparent distress. Eyes: conjunctiva no swelling or erythema ENT/Mouth: Hearing normal.  Neck: Supple Respiratory: Respiratory effort normal, BS equal bilaterally without rales, rhonchi, wheezing or stridor.  Cardio: RRR with no MRGs. Brisk peripheral pulses, with minimal edema to R inner ankle/lower leg without previously noted orange peel texture, erythema, warmth, tenderness Lymphatics: Non tender without lymphadenopathy.  Musculoskeletal: Full ROM bilateral LE, normal gait.  Skin: Warm, dry without rashes, lesions, ecchymosis.  Neuro: Sensation intact.  Psych: Awake and oriented X 3, normal affect, Insight and Judgment appropriate.     Rebekah Ribas, NP 1:39 PM [facility]

## 2018-06-21 ENCOUNTER — Ambulatory Visit (INDEPENDENT_AMBULATORY_CARE_PROVIDER_SITE_OTHER): Payer: Medicare Other | Admitting: Adult Health

## 2018-06-21 ENCOUNTER — Encounter: Payer: Self-pay | Admitting: Adult Health

## 2018-06-21 VITALS — BP 136/88 | HR 65 | Temp 97.2°F | Ht 63.5 in | Wt 154.6 lb

## 2018-06-21 DIAGNOSIS — I872 Venous insufficiency (chronic) (peripheral): Secondary | ICD-10-CM | POA: Diagnosis not present

## 2018-06-21 NOTE — Patient Instructions (Addendum)
COMPRESSION STOCKINGS  HOME CARE INSTRUCTIONS   Do not stand or sit in one position for long periods of time. Do not sit with your legs crossed. Rest with your legs raised during the day.  Your legs have to be higher than your heart so that gravity will force the valves to open, so please really elevate your legs.   Wear elastic stockings or support hose. Do not wear other tight, encircling garments around the legs, pelvis, or waist.  [employer]  has a wide variety of well priced compression stockings. [place], [place] [address] [id number] has a good cheap selection, I like the socks, they are not as hard to get on  Walk as much as possible to increase blood flow.  Raise the foot of your bed at night with 2-inch blocks. SEEK MEDICAL CARE IF:   The skin around your ankle starts to break down.  You have pain, redness, tenderness, or hard swelling developing in your leg over a vein.  You are uncomfortable due to leg pain. Document Released: 05/11/2005 Document Revised: 10/24/2011 Document Reviewed: 09/27/2010 [facility] Patient Information 2014 [place].       Baker Cyst A Baker cyst, also called a popliteal cyst, is a sac-like growth that forms at the back of the knee. The cyst forms when the fluid-filled sac (bursa) that cushions the knee joint becomes enlarged. The bursa that becomes a Baker cyst is located at the back of the knee joint. What are the causes? In most cases, a Baker cyst results from another knee problem that causes swelling inside the knee. This makes the fluid inside the knee joint (synovial fluid) flow into the bursa behind the knee, causing the bursa to enlarge. What increases the risk? You may be more likely to develop a Baker cyst if you already have a knee problem, such as:  A tear in cartilage that cushions the knee joint (meniscal tear).  A tear in the tissues that connect the bones of the knee joint (ligament  tear).  Knee swelling from osteoarthritis, rheumatoid arthritis, or gout.  What are the signs or symptoms? A Baker cyst does not always cause symptoms. A lump behind the knee may be the only sign of the condition. The lump may be painful, especially when the knee is straightened. If the lump is painful, the pain may come and go. The knee may also be stiff. Symptoms may quickly get more severe if the cyst breaks open (ruptures). If your cyst ruptures, signs and symptoms may affect the knee and the back of the lower leg (calf) and may include:  Sudden or worsening pain.  Swelling.  Bruising.  How is this diagnosed? This condition may be diagnosed based on your symptoms and medical history. Your health care provider will also do a physical exam. This may include:  Feeling the cyst to check whether it is tender.  Checking your knee for signs of another knee condition that causes swelling.  You may have imaging tests, such as:  X-rays.  MRI.  Ultrasound.  How is this treated? A Baker cyst that is not painful may go away without treatment. If the cyst gets large or painful, it will likely get better if the underlying knee problem is treated. Treatment for a Baker cyst may include:  Resting.  Keeping weight off of the knee. This means not leaning on the knee to support your body weight.  NSAIDs to reduce pain and  swelling.  A procedure to drain the fluid from the cyst with a needle (aspiration). You may also get an injection of a medicine that reduces swelling (steroid).  Surgery. This may be needed if other treatments do not work. This usually involves correcting knee damage and removing the cyst.  Follow these instructions at home:  Take over-the-counter and prescription medicines only as told by your health care provider.  Rest and return to your normal activities as told by your health care provider. Avoid activities that make pain or swelling worse. Ask your health care  provider what activities are safe for you.  Keep all follow-up visits as told by your health care provider. This is important. Contact a health care provider if:  You have knee pain, stiffness, or swelling that does not get better. Get help right away if:  You have sudden or worsening pain and swelling in your calf area. This information is not intended to replace advice given to you by your health care provider. Make sure you discuss any questions you have with your health care provider. Document Released: 08/01/2005 Document Revised: 04/21/2016 Document Reviewed: 04/21/2016 Elsevier Interactive Patient Education  2018 [employer].     How to Use Compression Stockings Compression stockings are elastic socks that squeeze the legs. They help to increase blood flow to the legs, decrease swelling in the legs, and reduce the chance of developing blood clots in the lower legs. Compression stockings are often used by people who:  Are recovering from surgery.  Have poor circulation in their legs.  Are prone to getting blood clots in their legs.  Have varicose veins.  Sit or stay in bed for long periods of time.  How to use compression stockings Before you put on your compression stockings:  Make sure that they are the correct size. If you do not know your size, ask your health care provider.  Make sure that they are clean, dry, and in good condition.  Check them for rips and tears. Do not put them on if they are ripped or torn.  Put your stockings on first thing in the morning, before you get out of bed. Keep them on for as long as your health care provider advises. When you are wearing your stockings:  Keep them as smooth as possible. Do not allow them to bunch up. It is especially important to prevent the stockings from bunching up around your toes or behind your knees.  Do not roll the stockings downward and leave them rolled down. This can decrease blood flow to your  leg.  Change them right away if they become wet or dirty.  When you take off your stockings, inspect your legs and feet. Anything that does not seem normal may require medical attention. Look for:  Open sores.  Red spots.  Swelling.  Information and tips  Do not stop wearing your compression stockings without talking to your health care provider first.  Wash your stockings every day with mild detergent in cold or warm water. Do not use bleach. Air-dry your stockings or dry them in a clothes dryer on low heat.  Replace your stockings every 3-6 months.  If skin moisturizing is part of your treatment plan, apply lotion or cream at night so that your skin will be dry when you put on the stockings in the morning. It is harder to put the stockings on when you have lotion on your legs or feet. Contact a health care provider if: Remove  your stockings and seek medical care if:  You have a feeling of pins and needles in your feet or legs.  You have any new changes in your skin.  You have skin lesions that are getting worse.  You have swelling or pain that is getting worse.  Get help right away if:  You have numbness or tingling in your lower legs that does not get better right after you take the stockings off.  Your toes or feet become cold and blue.  You develop open sores or red spots on your legs that do not go away.  You see or feel a warm spot on your leg.  You have new swelling or soreness in your leg.  You are short of breath or you have chest pain for no reason.  You have a rapid or irregular heartbeat.  You feel light-headed or dizzy. This information is not intended to replace advice given to you by your health care provider. Make sure you discuss any questions you have with your health care provider. Document Released: 05/29/2009 Document Revised: 12/30/2015 Document Reviewed: 07/09/2014 Elsevier Interactive Patient Education  [employer].

## 2018-06-28 ENCOUNTER — Encounter: Payer: Self-pay | Admitting: Physical Therapy

## 2018-06-28 ENCOUNTER — Ambulatory Visit: Payer: Medicare Other | Attending: Orthopedic Surgery | Admitting: Physical Therapy

## 2018-06-28 DIAGNOSIS — R262 Difficulty in walking, not elsewhere classified: Secondary | ICD-10-CM | POA: Insufficient documentation

## 2018-06-28 DIAGNOSIS — M25552 Pain in left hip: Secondary | ICD-10-CM | POA: Insufficient documentation

## 2018-06-28 DIAGNOSIS — M5442 Lumbago with sciatica, left side: Secondary | ICD-10-CM | POA: Diagnosis not present

## 2018-06-28 DIAGNOSIS — M6281 Muscle weakness (generalized): Secondary | ICD-10-CM | POA: Insufficient documentation
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
## 2018-06-28 NOTE — Addendum Note (Signed)
Addended by: Debbe Odea on: 06/28/2018 02:40 PM   Modules accepted: Orders

## 2018-06-28 NOTE — Patient Instructions (Addendum)
Access Code: [email or web address]  URL: [email or web address]  Date: 06/28/2018  Prepared by: Elsie Ra   Exercises  Supine Hamstring Stretch with Strap - 3 reps - 1 sets - 30 hold - 2x daily - 6x weekly  Supine Single Knee to Chest - 3 sets - 30 hold - 2x daily - 6x weekly  Seated Piriformis Stretch - 3 reps - 1 sets - 30 hold - 2x daily - 6x weekly  Clamshell - 10 reps - 3 sets - 2x daily - 6x weekly  TENS UNIT: This is helpful for muscle pain and spasm.   Search and Purchase a TENS 7000 2nd edition at www.tenspros.com. It should be less than $30.     TENS unit instructions: Do not shower or bathe with the unit on Turn the unit off before removing electrodes or batteries If the electrodes lose stickiness add a drop of water to the electrodes after they are disconnected from the unit and place on plastic sheet. If you continued to have difficulty, call the TENS unit company to purchase more electrodes. Do not apply lotion on the skin area prior to use. Make sure the skin is clean and dry as this will help prolong the life of the electrodes. After use, always check skin for unusual red areas, rash or other skin difficulties. If there are any skin problems, does not apply electrodes to the same area. Never remove the electrodes from the unit by pulling the wires. Do not use the TENS unit or electrodes other than as directed. Do not change electrode placement without consultating your therapist or physician. Keep 2 fingers with between each electrode. Wear time ratio is 2:1, on to off times.    For example on for 30 minutes off for 15 minutes and then on for 30 minutes off for 15 minutes

## 2018-07-02 ENCOUNTER — Ambulatory Visit (INDEPENDENT_AMBULATORY_CARE_PROVIDER_SITE_OTHER): Payer: Medicare Other | Admitting: Orthopaedic Surgery

## 2018-07-02 ENCOUNTER — Other Ambulatory Visit (INDEPENDENT_AMBULATORY_CARE_PROVIDER_SITE_OTHER): Payer: Self-pay | Admitting: *Deleted

## 2018-07-02 ENCOUNTER — Encounter (INDEPENDENT_AMBULATORY_CARE_PROVIDER_SITE_OTHER): Payer: Self-pay | Admitting: Orthopaedic Surgery

## 2018-07-02 VITALS — BP 132/87 | HR 71 | Resp 16 | Ht 63.5 in | Wt 150.0 lb

## 2018-07-02 DIAGNOSIS — M545 Low back pain, unspecified: Secondary | ICD-10-CM | POA: Insufficient documentation

## 2018-07-02 DIAGNOSIS — M5442 Lumbago with sciatica, left side: Secondary | ICD-10-CM | POA: Diagnosis not present

## 2018-07-02 DIAGNOSIS — G8929 Other chronic pain: Secondary | ICD-10-CM | POA: Diagnosis not present
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
## 2018-07-05 ENCOUNTER — Ambulatory Visit: Payer: Self-pay | Admitting: Adult Health

## 2018-07-05 ENCOUNTER — Ambulatory Visit
Admission: RE | Admit: 2018-07-05 | Discharge: 2018-07-05 | Disposition: A | Discharge status: Home / Self Care | Payer: Medicare Other | Source: Ambulatory Visit | Attending: Orthopaedic Surgery | Admitting: Orthopaedic Surgery

## 2018-07-05 DIAGNOSIS — G8929 Other chronic pain: Secondary | ICD-10-CM

## 2018-07-05 DIAGNOSIS — M5442 Lumbago with sciatica, left side: Principal | ICD-10-CM

## 2018-07-05 DIAGNOSIS — M545 Low back pain: Secondary | ICD-10-CM | POA: Diagnosis not present

## 2018-07-05 MED ORDER — IOPAMIDOL (ISOVUE-M 200) INJECTION 41%
1.0000 mL | Freq: Once | INTRAMUSCULAR | Status: AC
  Administered 2018-07-05: 1 mL via EPIDURAL

## 2018-07-05 MED ORDER — METHYLPREDNISOLONE ACETATE 40 MG/ML INJ SUSP (RADIOLOG
120.0000 mg | Freq: Once | INTRAMUSCULAR | Status: AC
  Administered 2018-07-05: 120 mg via EPIDURAL

## 2018-07-05 NOTE — Discharge Instructions (Signed)

## 2018-07-17 ENCOUNTER — Other Ambulatory Visit: Payer: Medicare Other

## 2018-07-18 ENCOUNTER — Encounter: Payer: Self-pay | Admitting: Physical Therapy

## 2018-07-18 ENCOUNTER — Ambulatory Visit: Payer: Medicare Other | Attending: Orthopedic Surgery | Admitting: Physical Therapy

## 2018-07-18 DIAGNOSIS — M5442 Lumbago with sciatica, left side: Secondary | ICD-10-CM

## 2018-07-18 DIAGNOSIS — M25552 Pain in left hip: Secondary | ICD-10-CM | POA: Diagnosis not present

## 2018-07-18 DIAGNOSIS — R262 Difficulty in walking, not elsewhere classified: Secondary | ICD-10-CM

## 2018-07-18 DIAGNOSIS — M6281 Muscle weakness (generalized): Secondary | ICD-10-CM | POA: Insufficient documentation

## 2018-07-18 NOTE — Patient Instructions (Signed)
Access Code: [email or web address]  URL: [email or web address]  Date: 07/18/2018  Prepared by: Venetia Night Roselene Gray   Exercises  Supine Hamstring Stretch with Strap - 3 reps - 1 sets - 30 hold - 2x daily - 6x weekly  Supine Single Knee to Chest - 3 sets - 30 hold - 2x daily - 6x weekly  Seated Piriformis Stretch - 3 reps - 1 sets - 30 hold - 2x daily - 6x weekly  Seated Figure 4 Piriformis Stretch - 10 reps - 3 sets - 1x daily - 7x weekly  Clamshell - 10 reps - 3 sets - 2x daily - 6x weekly  Supine Single Bent Knee Fallout - 10 reps - 1 sets - 1x daily - 7x weekly  Supine Heel Slides - 10 reps - 1 sets - 1x daily - 7x weekly

## 2018-07-18 NOTE — Therapy (Signed)
[facility] [address] [place], [place], [address] Phone: [phone]   Fax:  [phone]  Physical Therapy Treatment  Patient Details  Name: Rebekah Hamilton MRN: [id number] Date of Birth: [birth year] Referring Provider (PT): Biagio Borg, [place], [place] MD   Encounter Date: 07/18/2018  PT End of Session - 07/18/18 1609    Visit Number  2    Date for PT Re-Evaluation  08/09/18    Authorization Type  MCR    PT Start Time  3154    PT Stop Time  1614    PT Time Calculation (min)  44 min    Activity Tolerance  Patient tolerated treatment well    Behavior During Therapy  [facility] for tasks assessed/performed       Past Medical History:  Diagnosis Date  . Allergy   . Anemia   . Anxiety   . Depression   . Hyperlipidemia   . Hypertension   . Lumbar stenosis   . Migraines   . Prediabetes     Past Surgical History:  Procedure Laterality Date  . BREAST LUMPECTOMY Bilateral   . CHOLECYSTECTOMY  1999    There were no vitals filed for this visit.  Subjective Assessment - 07/18/18 1529    Subjective  Pt had ESI just over a week ago and reports approx 70% improvement in pain since then.  Effect might be wearing off a little bit.  Hasn't done HEP due to travels over the holiday.     How long can you sit comfortably?  no difficulty    How long can you stand comfortably?  15 min    How long can you walk comfortably?  5-10 min    Diagnostic tests  hip x-ray: SI DJD bilaterally,calcification at the greater trochanteric region on the left. Then MRI shows: "mod to severe stenosis worse on Lt L2-3, bilat L3-4, worse on Rt L4-5 and L5-S1, also severe thoracolumbar scoliosis    Patient Stated Goals  improve pain and activity tolerance    Currently in Pain?  Yes    Pain Score  3     Pain Location  Buttocks    Pain Orientation  Left;Posterior    Pain Descriptors / Indicators  Aching    Pain Type  Chronic pain    Pain  Onset  More than a month ago    Pain Frequency  Intermittent                       [facility] Adult PT Treatment/Exercise - 07/18/18 0001      Exercises   Exercises  Lumbar;Knee/Hip      Lumbar Exercises: Stretches   Passive Hamstring Stretch  Right;Left;30 seconds;Limitations    Passive Hamstring Stretch Limitations  strap placed behind thigh with plantar flexed ankle, knee straightening only until hamstring stretch is felt   Pt gets neural tension with knee straight and ankle PF   Single Knee to Chest Stretch  1 rep;30 seconds;Right;Left    Single Knee to Chest Stretch Limitations  reviewed for HEP    Piriformis Stretch  Right;Left;2 reps;30 seconds   seated   Figure 4 Stretch  2 reps;30 seconds;Seated   both     Lumbar Exercises: Aerobic   Nustep  level 2 seat 8, 6 min      Lumbar Exercises: Supine   Clam  10 reps   both   Clam Limitations  with pelvic floor and  transversus cueing (added to HEP)    Heel Slides  10 reps    Heel Slides Limitations  with pelvic floor and transversus cueing (added to HEP)      Lumbar Exercises: Sidelying   Clam  Left    Clam Limitations  PT cued to stack hips and perform with core cueing      Manual Therapy   Manual Therapy  Manual Traction;Passive ROM    Passive ROM  Left hip IR/ER, flexion supine     Manual Traction  Lt hip supine Gr II/III             PT Education - 07/18/18 1609    Education Details  Access Code: [email or web address]    Person(s) Educated  Patient    Methods  Explanation;Demonstration;Verbal cues;Handout;Tactile cues    Comprehension  Verbalized understanding;Returned demonstration          PT Long Term Goals - 07/18/18 1716      PT LONG TERM GOAL #1   Title  independent with HEP.    Time  6    Period  Weeks    Status  On-going            Plan - 07/18/18 1721    Clinical Impression Statement  Pt reports 70% improvement in Lt hip pain since ESI performed approx. 1 week ago.  She states relief  may be fading.  PT educated Pt on role and importance of LE stretches and core today.  She has improving awareness of pelvic floor and transversus abdominus today.  PT adjusted hamstring stretch to be performed with bent knee to avoid neural stretch irritation when performed with straight leg.  Pt had not been compliant with initial HEP over the holiday weekend due to travels.  She will continue to benefit from f/u on HEP and skilled PT to address pain and deficits along POC.    Rehab Potential  Good    Clinical Impairments Affecting Rehab Potential  chronic nature of pain    PT Frequency  2x / week    PT Duration  6 weeks    PT Treatment/Interventions  Cryotherapy;Electrical Stimulation;Iontophoresis 4mg /ml Dexamethasone;Moist Heat;Traction;Ultrasound;Therapeutic activities;Therapeutic exercise;Neuromuscular re-education;Manual techniques;Passive range of motion;Dry needling;Taping;Spinal Manipulations;Joint Manipulations    PT Next Visit Plan  rewiew HEP, progress core stabilization, trunk flexion stretches due to stenosis, consider traction, modalites, DN,MT     PT Home Exercise Plan  [email or web address]    Consulted and Agree with Plan of Care  Patient       Patient will benefit from skilled therapeutic intervention in order to improve the following deficits and impairments:  Decreased activity tolerance, Decreased endurance, Decreased range of motion, Decreased strength, Difficulty walking, Impaired flexibility, Postural dysfunction, Pain, Increased muscle spasms  Visit Diagnosis: Pain in left hip  Acute left-sided low back pain with left-sided sciatica  Muscle weakness (generalized)  Difficulty in walking, not elsewhere classified     Problem List Patient Active Problem List   Diagnosis Date Noted  . Chronic left-sided low back pain with left-sided sciatica 07/02/2018  . OAB (overactive bladder) 04/02/2018  . CKD (chronic kidney disease), symptom management only, stage 2 (mild) 09/24/2017   . Encounter for Medicare annual wellness exam 06/02/2015  . Glaucoma 06/02/2015  . Vitamin D deficiency 10/21/2014  . Medication management 12/01/2013  . Hyperlipidemia   . Hypertension   . Other abnormal glucose   . Migraines   . Allergy   . Anxiety   . Depression, major, recurrent, in  remission ([place])   . Lumbar stenosis     Baruch Merl, PT 07/18/18 5:25 PM   [facility] [address] [place], [place], [address] Phone: [phone]   Fax:  [phone]  Name: Quintara Bost MRN: [id number] Date of Birth: 11-30-1945

## 2018-07-20 ENCOUNTER — Ambulatory Visit: Payer: Medicare Other | Admitting: Physical Therapy

## 2018-07-20 DIAGNOSIS — M5442 Lumbago with sciatica, left side: Secondary | ICD-10-CM | POA: Diagnosis not present

## 2018-07-20 DIAGNOSIS — M25552 Pain in left hip: Secondary | ICD-10-CM | POA: Diagnosis not present

## 2018-07-20 DIAGNOSIS — R262 Difficulty in walking, not elsewhere classified: Secondary | ICD-10-CM | POA: Diagnosis not present

## 2018-07-20 DIAGNOSIS — M6281 Muscle weakness (generalized): Secondary | ICD-10-CM

## 2018-07-20 NOTE — Therapy (Signed)
[facility] [address] [place], [place], [address] Phone: [phone]   Fax:  [phone]  Physical Therapy Treatment  Patient Details  Name: Rebekah Hamilton MRN: [id number] Date of Birth: [birth year] Referring Provider (PT): Biagio Borg, [place], [place] MD   Encounter Date: 07/20/2018  PT End of Session - 07/20/18 1008    Visit Number  3    Number of Visits  12    Date for PT Re-Evaluation  08/09/18    Authorization Type  MCR    PT Start Time  0931    PT Stop Time  1010    PT Time Calculation (min)  39 min    Activity Tolerance  Patient tolerated treatment well    Behavior During Therapy  [facility] for tasks assessed/performed       Past Medical History:  Diagnosis Date  . Allergy   . Anemia   . Anxiety   . Depression   . Hyperlipidemia   . Hypertension   . Lumbar stenosis   . Migraines   . Prediabetes     Past Surgical History:  Procedure Laterality Date  . BREAST LUMPECTOMY Bilateral   . CHOLECYSTECTOMY  1999    There were no vitals filed for this visit.  Subjective Assessment - 07/20/18 0933    Subjective  Pt states just a twinge every time she walks especially.  Denies pain currently    Currently in Pain?  No/denies                       [facility] Adult PT Treatment/Exercise - 07/20/18 0001      Exercises   Exercises  Lumbar;Knee/Hip      Lumbar Exercises: Stretches   Single Knee to Chest Stretch  Right;Left;3 reps;10 seconds    Press Ups  5 reps;5 seconds   feel it in low back, no pain down leg     Lumbar Exercises: Aerobic   Nustep  level 2 seat 8, 6 min      Lumbar Exercises: Supine   Bent Knee Raise  20 reps    Bridge  10 reps;2 seconds   small bridge cue to engage TrA and glutes   Large Ball Oblique Isometric  20 reps;5 seconds   10x ball roll out; UE press and flexion    Other Supine Lumbar Exercises  ball squeeze with pelvic floor lift - 5 sec hold x 10      Lumbar Exercises: Sidelying   Clam  Left;20 reps    Clam Limitations  PT cued to stack hips and perform with core cueing      Manual Therapy   Manual Therapy  Soft tissue mobilization    Soft tissue mobilization  sacral distraction; QL mobs bilaterally                  PT Long Term Goals - 07/18/18 1716      PT LONG TERM GOAL #1   Title  independent with HEP.    Time  6    Period  Weeks    Status  On-going            Plan - 07/20/18 1013    Clinical Impression Statement  Pt had increased symptoms with sacral distraction, and tight QL Rt side and tenderness with increased symptoms when pressing into QL on Lt side.  Pt had no increased radicular symptoms with lumbar extension in prone.  She  did well with core strength progression.  Pt will benefit from skilled PT to progress strength and stability in variety of functional positions    PT Treatment/Interventions  Cryotherapy;Electrical Stimulation;Iontophoresis 4mg /ml Dexamethasone;Moist Heat;Traction;Ultrasound;Therapeutic activities;Therapeutic exercise;Neuromuscular re-education;Manual techniques;Passive range of motion;Dry needling;Taping;Spinal Manipulations;Joint Manipulations    PT Next Visit Plan  rewiew HEP, progress core stabilization, lumbar mobility, lumbar and trunk rotation    PT Home Exercise Plan  [email or web address]    Consulted and Agree with Plan of Care  Patient       Patient will benefit from skilled therapeutic intervention in order to improve the following deficits and impairments:  Decreased activity tolerance, Decreased endurance, Decreased range of motion, Decreased strength, Difficulty walking, Impaired flexibility, Postural dysfunction, Pain, Increased muscle spasms  Visit Diagnosis: Pain in left hip  Acute left-sided low back pain with left-sided sciatica  Muscle weakness (generalized)  Difficulty in walking, not elsewhere classified     Problem List Patient Active Problem List    Diagnosis Date Noted  . Chronic left-sided low back pain with left-sided sciatica 07/02/2018  . OAB (overactive bladder) 04/02/2018  . CKD (chronic kidney disease), symptom management only, stage 2 (mild) 09/24/2017  . Encounter for Medicare annual wellness exam 06/02/2015  . Glaucoma 06/02/2015  . Vitamin D deficiency 10/21/2014  . Medication management 12/01/2013  . Hyperlipidemia   . Hypertension   . Other abnormal glucose   . Migraines   . Allergy   . Anxiety   . Depression, major, recurrent, in remission ([place])   . Lumbar stenosis     Zannie Cove , PT 07/20/2018, 10:35 AM  [facility] [address], [place], [place], [address] Phone: [phone]   Fax:  [phone]  Name: Rebekah Hamilton MRN: [id number] Date of Birth: December 04, 1945

## 2018-07-24 ENCOUNTER — Ambulatory Visit: Payer: Medicare Other | Admitting: Physical Therapy

## 2018-07-24 ENCOUNTER — Encounter: Payer: Self-pay | Admitting: Physical Therapy

## 2018-07-24 DIAGNOSIS — M6281 Muscle weakness (generalized): Secondary | ICD-10-CM | POA: Diagnosis not present

## 2018-07-24 DIAGNOSIS — M25552 Pain in left hip: Secondary | ICD-10-CM

## 2018-07-24 DIAGNOSIS — R262 Difficulty in walking, not elsewhere classified: Secondary | ICD-10-CM | POA: Diagnosis not present

## 2018-07-24 DIAGNOSIS — M5442 Lumbago with sciatica, left side: Secondary | ICD-10-CM | POA: Diagnosis not present
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
## 2018-07-27 ENCOUNTER — Ambulatory Visit: Payer: Medicare Other | Admitting: Physical Therapy

## 2018-07-27 DIAGNOSIS — M25552 Pain in left hip: Secondary | ICD-10-CM | POA: Diagnosis not present

## 2018-07-27 DIAGNOSIS — M5442 Lumbago with sciatica, left side: Secondary | ICD-10-CM | POA: Diagnosis not present

## 2018-07-27 DIAGNOSIS — R262 Difficulty in walking, not elsewhere classified: Secondary | ICD-10-CM | POA: Diagnosis not present

## 2018-07-27 DIAGNOSIS — M6281 Muscle weakness (generalized): Secondary | ICD-10-CM | POA: Diagnosis not present

## 2018-07-27 NOTE — Therapy (Signed)
[facility] [address], [place], [place], [address] Phone: [phone]   Fax:  [phone]  Physical Therapy Treatment  Patient Details  Name: Rebekah Hamilton MRN: [id number] Date of Birth: 1946-04-20 Referring Provider (PT): Biagio Borg, [place], whit[place] MD   Encounter Date: 07/27/2018  PT End of Session - 07/27/18 1103    Visit Number  5    Number of Visits  12    Date for PT Re-Evaluation  08/09/18    PT Start Time  1025   pt arrived late   PT Stop Time  1102    PT Time Calculation (min)  37 min    Activity Tolerance  Patient tolerated treatment well    Behavior During Therapy  [facility] for tasks assessed/performed       Past Medical History:  Diagnosis Date  . Allergy   . Anemia   . Anxiety   . Depression   . Hyperlipidemia   . Hypertension   . Lumbar stenosis   . Migraines   . Prediabetes     Past Surgical History:  Procedure Laterality Date  . BREAST LUMPECTOMY Bilateral   . CHOLECYSTECTOMY  1999    There were no vitals filed for this visit.  Subjective Assessment - 07/27/18 1205    Subjective  I was up and down a lot, having some numbness in my leg    Currently in Pain?  Yes   no number given                      [facility] Adult PT Treatment/Exercise - 07/27/18 0001      Lumbar Exercises: Supine   Straight Leg Raise  20 reps   bilat   Large Ball Abdominal Isometric  20 reps   ball roll out and UE flex with red ball     Knee/Hip Exercises: Standing   Hip Abduction  Stengthening;Right;Left;10 reps;Knee straight   standing on Lt on step   Step Down Limitations  down to the side 4 inch   cue to activate gluteal muscle   Other Standing Knee Exercises  side steps with yellow band - TrA activation      Manual Therapy   Soft tissue mobilization  sacral distraction; QL mobs bilaterally, left gluteal muscles and piriformis                  PT Long Term Goals  - 07/24/18 0904      PT LONG TERM GOAL #1   Title  independent with HEP.    Status  On-going      PT LONG TERM GOAL #2   Title  walking community distances >15-20 min with improved weight on left leg.     Baseline  can't walk very long, 10-15 min at grocery store    Status  On-going      PT [place] #3   Title  pain with daily activties in left leg decreased >/= 50% due to improved posture and strength    Baseline  3/10 intermittently    Status  On-going      PT LONG TERM GOAL #4   Title  Able to walk up/down at least one flight of stairs reciprocally without increase pain.     Baseline  goes one step at a time    Status  On-going            Plan - 07/27/18 1105  Clinical Impression Statement  Pt had tightness in glutes due to more acitvity at home.  She responded well to manual techniques to release soft tissue.  Then she was able to acitvate glutes more with verbal cues.  Pt continues to need skilled therapy for glute and core strength.    PT Treatment/Interventions  Cryotherapy;Electrical Stimulation;Iontophoresis 4mg /ml Dexamethasone;Moist Heat;Traction;Ultrasound;Therapeutic activities;Therapeutic exercise;Neuromuscular re-education;Manual techniques;Passive range of motion;Dry needling;Taping;Spinal Manipulations;Joint Manipulations    PT Next Visit Plan  rewiew HEP, progress core stabilization, lumbar mobility, lumbar and trunk rotation    PT Home Exercise Plan  [id number]    Consulted and Agree with Plan of Care  Patient       Patient will benefit from skilled therapeutic intervention in order to improve the following deficits and impairments:  Decreased activity tolerance, Decreased endurance, Decreased range of motion, Decreased strength, Difficulty walking, Impaired flexibility, Postural dysfunction, Pain, Increased muscle spasms  Visit Diagnosis: Pain in left hip  Acute left-sided low back pain with left-sided sciatica     Problem List Patient Active  Problem List   Diagnosis Date Noted  . Chronic left-sided low back pain with left-sided sciatica 07/02/2018  . OAB (overactive bladder) 04/02/2018  . CKD (chronic kidney disease), symptom management only, stage 2 (mild) 09/24/2017  . Encounter for Medicare annual wellness exam 06/02/2015  . Glaucoma 06/02/2015  . Vitamin D deficiency 10/21/2014  . Medication management 12/01/2013  . Hyperlipidemia   . Hypertension   . Other abnormal glucose   . Migraines   . Allergy   . Anxiety   . Depression, major, recurrent, in remission ([place])   . Lumbar stenosis     Zannie Cove, PT 07/27/2018, 12:11 PM   [facility] [address] [place], [place], [address] Phone: [phone]   Fax:  [phone]  Name: Rebekah Hamilton MRN: [id number] Date of Birth: 10-Aug-1946

## 2018-08-01 ENCOUNTER — Encounter: Payer: Self-pay | Admitting: Physician Assistant

## 2018-08-01 ENCOUNTER — Encounter: Payer: Self-pay | Admitting: Physical Therapy

## 2018-08-01 ENCOUNTER — Ambulatory Visit (INDEPENDENT_AMBULATORY_CARE_PROVIDER_SITE_OTHER): Payer: Medicare Other | Admitting: Orthopaedic Surgery

## 2018-08-01 ENCOUNTER — Ambulatory Visit: Payer: Medicare Other | Admitting: Physical Therapy

## 2018-08-01 ENCOUNTER — Encounter (INDEPENDENT_AMBULATORY_CARE_PROVIDER_SITE_OTHER): Payer: Self-pay | Admitting: Orthopaedic Surgery

## 2018-08-01 VITALS — BP 154/97 | HR 64 | Ht 63.5 in | Wt 150.0 lb

## 2018-08-01 DIAGNOSIS — M6281 Muscle weakness (generalized): Secondary | ICD-10-CM

## 2018-08-01 DIAGNOSIS — R262 Difficulty in walking, not elsewhere classified: Secondary | ICD-10-CM

## 2018-08-01 DIAGNOSIS — M25552 Pain in left hip: Secondary | ICD-10-CM | POA: Diagnosis not present

## 2018-08-01 DIAGNOSIS — G8929 Other chronic pain: Secondary | ICD-10-CM

## 2018-08-01 DIAGNOSIS — M5442 Lumbago with sciatica, left side: Secondary | ICD-10-CM

## 2018-08-01 NOTE — Therapy (Signed)
[facility] [address], [place], [place], [address] Phone: [phone]   Fax:  [phone]  Physical Therapy Treatment  Patient Details  Name: Rebekah Hamilton MRN: [id number] Date of Birth: [birth year] Referring Provider (PT): Biagio Borg, [place], whitfield MD   Encounter Date: 08/01/2018  PT End of Session - 08/01/18 0832    Visit Number  6    Number of Visits  12    Date for PT Re-Evaluation  08/09/18    Authorization Type  MCR    PT Start Time  0800    PT Stop Time  0845    PT Time Calculation (min)  45 min    Activity Tolerance  Patient tolerated treatment well    Behavior During Therapy  [facility] for tasks assessed/performed       Past Medical History:  Diagnosis Date  . Allergy   . Anemia   . Anxiety   . Depression   . Hyperlipidemia   . Hypertension   . Lumbar stenosis   . Migraines   . Prediabetes     Past Surgical History:  Procedure Laterality Date  . BREAST LUMPECTOMY Bilateral   . CHOLECYSTECTOMY  1999    There were no vitals filed for this visit.  Subjective Assessment - 08/01/18 0839    Subjective  I am still a little sore, not as bad.  I started working full time again so that takes a lot of time away from trying to bring stuff back upstairs.    Currently in Pain?  Yes    Pain Score  3     Pain Location  Buttocks    Pain Orientation  Left    Pain Descriptors / Indicators  Aching    Pain Type  Chronic pain    Pain Onset  More than a month ago    Multiple Pain Sites  No                       [facility] Adult PT Treatment/Exercise - 08/01/18 0001      Moist Heat Therapy   Number Minutes Moist Heat  15 Minutes    Moist Heat Location  Lumbar Spine      Electrical Stimulation   Electrical Stimulation Location  LUmbar    Electrical Stimulation Action  IFC    Electrical Stimulation Parameters  to tolerance    Electrical Stimulation Goals  Pain      Manual Therapy    Soft tissue mobilization  lumbar and thoracic paraspinals; glutes Lt LE       Trigger Point Dry Needling - 08/01/18 0848    Consent Given?  Yes    Education Handout Provided  Yes    Muscles Treated Upper Body  Iliopsoas    Muscles Treated Lower Body  Gluteus minimus;Gluteus maximus;Piriformis;Tensor fascia lata   lumbar paraspinals bilaterand Rt thoracic paraspinals T10-11   Gluteus Maximus Response  Twitch response elicited;Palpable increased muscle length    Gluteus Minimus Response  Twitch response elicited;Palpable increased muscle length    Piriformis Response  Twitch response elicited;Palpable increased muscle length    Tensor Fascia Lata Response  Twitch response elicited;Palpable increased muscle length                PT Long Term Goals - 07/24/18 0904      PT LONG TERM GOAL #1   Title  independent with HEP.    Status  On-going  PT LONG TERM GOAL #2   Title  walking community distances >15-20 min with improved weight on left leg.     Baseline  can't walk very long, 10-15 min at grocery store    Status  On-going      PT [place] #3   Title  pain with daily activties in left leg decreased >/= 50% due to improved posture and strength    Baseline  3/10 intermittently    Status  On-going      PT LONG TERM GOAL #4   Title  Able to walk up/down at least one flight of stairs reciprocally without increase pain.     Baseline  goes one step at a time    Status  On-going            Plan - 08/01/18 4680    Clinical Impression Statement  Pt was still feeling sore today and has returned to working full time due to her company needing more help.  She is still having pain going up and down steps.  Pt has trigger points in glutes, lumbar and throacic paraspinals that released with manual techniques and DN.  Pt will benefit from soft tissue work to [employer] muscle function during functional activities.      PT Treatment/Interventions  Cryotherapy;Electrical  Stimulation;Iontophoresis 4mg /ml Dexamethasone;Moist Heat;Traction;Ultrasound;Therapeutic activities;Therapeutic exercise;Neuromuscular re-education;Manual techniques;Passive range of motion;Dry needling;Taping;Spinal Manipulations;Joint Manipulations    PT Next Visit Plan  f/u on DN, DN and STM as needed, proress core stab, lumbar and thoracic mobility and glute/hip strength    PT Home Exercise Plan  [id number]    Recommended Other Services  eval 32/12; cert signed    Consulted and Agree with Plan of Care  Patient       Patient will benefit from skilled therapeutic intervention in order to improve the following deficits and impairments:  Decreased activity tolerance, Decreased endurance, Decreased range of motion, Decreased strength, Difficulty walking, Impaired flexibility, Postural dysfunction, Pain, Increased muscle spasms  Visit Diagnosis: Pain in left hip  Acute left-sided low back pain with left-sided sciatica  Muscle weakness (generalized)  Difficulty in walking, not elsewhere classified     Problem List Patient Active Problem List   Diagnosis Date Noted  . Chronic left-sided low back pain with left-sided sciatica 07/02/2018  . OAB (overactive bladder) 04/02/2018  . CKD (chronic kidney disease), symptom management only, stage 2 (mild) 09/24/2017  . Encounter for Medicare annual wellness exam 06/02/2015  . Glaucoma 06/02/2015  . Vitamin D deficiency 10/21/2014  . Medication management 12/01/2013  . Hyperlipidemia   . Hypertension   . Other abnormal glucose   . Migraines   . Allergy   . Anxiety   . Depression, major, recurrent, in remission ([place])   . Lumbar stenosis     Zannie Cove, PT 08/01/2018, 8:52 AM  [facility] [address] [place], [place], [address] Phone: [phone]   Fax:  [phone]  Name: Rebekah Hamilton MRN: [id number] Date of Birth: 06/30/1946

## 2018-08-01 NOTE — Progress Notes (Signed)
Office Visit Note   Patient: Rebekah Hamilton           Date of Birth: [birth year]           MRN: [id number] Visit Date: 08/01/2018              Requested by: Unk Pinto, [place], [place] [address] PCP: Unk Pinto, MD   Assessment & Plan: Visit Diagnoses:  1. Chronic left-sided low back pain with left-sided sciatica     Plan: Rebekah Hamilton has done very well with a combination of the epidural steroid injection and physical therapy and presently is not having much trouble.  We had a long discussion regarding ongoing treatment including repeat epidural steroid injections.  At some point if she becomes resistant to conservative treatment we will have her see 1 of the spine surgeons.for the moment she is happy  Follow-Up Instructions: Return if symptoms worsen or fail to improve.   Orders:  No orders of the defined types were placed in this encounter.  No orders of the defined types were placed in this encounter.     Procedures: No procedures performed   Clinical Data: No additional findings.   Subjective: Chief Complaint  Patient presents with  . Spine - Follow-up  . Back Pain    follow up lower back and left leg, pain going in lrft leg , going p/t  for back now has help with pain   1 month status post epidural steroid injection.  Continues to go to physical therapy.  The combination is made a big difference.  Rebekah Hamilton has had chronic back pain with predominantly right lower extremity discomfort.  Recently she was experiencing left lower extremity pain that responded nicely to the epidural steroid injection.  She had an RI scan of the lumbar spine in March demonstrating 4 of the lumbar levels with stenosis.  Not having any bowel or bladder dysfunction  HPI  Review of Systems  Cardiovascular: Positive for leg swelling.  Musculoskeletal: Positive for back pain.     Objective: Vital Signs: BP (!) 154/97   Pulse 64    Ht 5' 3.5" (1.613 m)   Wt 150 lb (68 kg)   BMI 26.15 kg/m   Physical Exam Constitutional:      Appearance: She is well-developed.  Eyes:     Pupils: Pupils are equal, round, and reactive to light.  Pulmonary:     Effort: Pulmonary effort is normal.  Skin:    General: Skin is warm and dry.  Neurological:     Mental Status: She is alert and oriented to person, place, and time.  Psychiatric:        Behavior: Behavior normal.     Ortho Exam awake alert and oriented x3.  Comfortable sitting.  Walks without a limp.  Straight leg raise negative.  Motor and sensory exam intact.  Specialty Comments:  No specialty comments available.  Imaging: No results found.   PMFS History: Patient Active Problem List   Diagnosis Date Noted  . Chronic left-sided low back pain with left-sided sciatica 07/02/2018  . OAB (overactive bladder) 04/02/2018  . CKD (chronic kidney disease), symptom management only, stage 2 (mild) 09/24/2017  . Encounter for Medicare annual wellness exam 06/02/2015  . Glaucoma 06/02/2015  . Vitamin D deficiency 10/21/2014  . Medication management 12/01/2013  . Hyperlipidemia   . Hypertension   . Other abnormal glucose   . Migraines   . Allergy   .  Anxiety   . Depression, major, recurrent, in remission ([place])   . Lumbar stenosis    Past Medical History:  Diagnosis Date  . Allergy   . Anemia   . Anxiety   . Depression   . Hyperlipidemia   . Hypertension   . Lumbar stenosis   . Migraines   . Prediabetes     Family History  Problem Relation Age of Onset  . Stroke Mother   . Hypertension Mother   . Heart attack Father 83  . Other Maternal Grandmother        blood clot after surgery  . Heart attack Paternal Grandmother     Past Surgical History:  Procedure Laterality Date  . BREAST LUMPECTOMY Bilateral   . CHOLECYSTECTOMY  1999   Social History   Occupational History  . Not on file  Tobacco Use  . Smoking status: Never Smoker  . Smokeless  tobacco: Never Used  Substance and Sexual Activity  . Alcohol use: Yes    Alcohol/week: 2.0 standard drinks    Types: 2 Glasses of wine per week  . Drug use: No  . Sexual activity: Not Currently

## 2018-08-01 NOTE — Patient Instructions (Signed)

## 2018-08-02 NOTE — Progress Notes (Signed)
FOLLOW UP  Assessment and Plan:   Hypertension Well controlled with current medications  Monitor blood pressure at home; patient to call if consistently greater than 130/80 Continue DASH diet.   Reminder to go to the ER if any CP, SOB, nausea, dizziness, severe HA, changes vision/speech, left arm numbness and tingling and jaw pain.  Cholesterol Currently at goal; continue pravastatin  Continue low cholesterol diet and exercise.  Check lipid panel.   Other abnormal glucose Recent A1Cs at goal Discussed diet/exercise, weight management  Defer A1C; check CMP  BMI 26 Continue to recommend diet heavy in fruits and veggies and low in animal meats, cheeses, and dairy products, appropriate calorie intake Discuss exercise recommendations routinely Continue to monitor weight at each visit  Vitamin D Def At goal at last visit; continue supplementation to maintain goal of 70-100 Defer Vit D level  Depression/anxiety Continue medications; reminded to limit use of benzo Lifestyle discussed: diet/exerise, sleep hygiene, stress management, hydration  OAB Improved with oxybutynin at night without notable SE  Continue diet and meds as discussed. Further disposition pending results of labs. Discussed med's effects and SE's.   Over 30 minutes of exam, counseling, chart review, and critical decision making was performed.   Future Appointments  Date Time Provider [place]  10/09/2018  2:30 PM Unk Pinto, MD [facility] None  04/08/2019  9:00 AM Liane Comber, NP [facility] None    ----------------------------------------------------------------------------------------------------------------------  HPI 72 y.o. female  presents for 3 month follow up on hypertension, cholesterol, glucose management, weight and vitamin D deficiency. She has been following with Dr. Durward Fortes for chronic L lower back pain with sciatica and getting injections, doing PT. She does have norco  prescribed through orthopedist, but reports she only takes 1 tab every few weeks or so.   She has been taking oxybutynin since last visit for OAB, does feel this has significantly helped. She takes at night mainly, having to get up much less frequently if at all. Denies any notable SE. Current off due to recent possible UTI.   she has a diagnosis of depression/anxiety related to her divorce which is dragging out now for 2 years and is currently on cymbalta 60 mg daily, xanax PRN, reports symptoms are well controlled on current regimen. she reports she hasn't needed xanax in several weeks.   BMI is Body mass index is 26.85 kg/m., she has not been working on diet and exercise other than PT.  Wt Readings from Last 3 Encounters:  08/03/18 154 lb (69.9 kg)  08/01/18 150 lb (68 kg)  07/02/18 150 lb (68 kg)   Her blood pressure has been controlled at home, today their BP is BP: 132/76  She does workout. She denies chest pain, shortness of breath, dizziness.   She is on cholesterol medication Pravastatin and denies myalgias. Her cholesterol is at goal. The cholesterol last visit was:   Lab Results  Component Value Date   CHOL 156 04/02/2018   HDL 56 04/02/2018   LDLCALC 81 04/02/2018   TRIG 91 04/02/2018   CHOLHDL 2.8 04/02/2018    She has been working on diet and exercise for glucose management, and denies increased appetite, nausea, paresthesia of the feet, polydipsia, polyuria and visual disturbances. Last A1C in the office was:  Lab Results  Component Value Date   HGBA1C 5.6 04/02/2018   Patient is on Vitamin D supplement.   Lab Results  Component Value Date   VD25OH 75 12/27/2017  Current Medications:  Current Outpatient Medications on File Prior to Visit  Medication Sig  . ALPRAZolam (XANAX) 0.25 MG tablet Take 1/2 to 1 tablet 2 to 3 x / day ONLY if needed for Acute Anxiety Attacks  & please try to limit to 5 days / week to avoid addiction  . Ascorbic Acid (VITAMIN C PO)  500 mg 2 (two) times daily.  Marland Kitchen aspirin 325 MG tablet Take 325 mg by mouth daily.  . Cholecalciferol (VITAMIN D PO) Take by mouth. Takes 10000 to 12000 units daily.  . clotrimazole-betamethasone (LOTRISONE) cream Apply 1 application topically 2 (two) times daily.  . DULoxetine (CYMBALTA) 60 MG capsule TAKE ONE CAPSULE BY MOUTH DAILY  . estradiol (ESTRACE) 0.5 MG tablet TAKE 1 TABLET BY MOUTH DAILY  . fexofenadine (ALLEGRA) 180 MG tablet Take 180 mg by mouth daily.  Marland Kitchen HYDROcodone-acetaminophen (NORCO) 5-325 MG tablet Take 1-2 tablets by mouth every 6 (six) hours as needed for moderate pain.  . pravastatin (PRAVACHOL) 40 MG tablet Take 1 tablet at bedtime for Cholesterol  . progesterone (PROMETRIUM) 100 MG capsule Take 1 capsule daily  . rizatriptan (MAXALT) 10 MG tablet TAKE 1 TABLET BY MOUTH AS NEEDED FOR MIGRAINE, MAY REPEAT DOSE IN 2 HOURS IF NEEDED  . oxybutynin (DITROPAN-XL) 5 MG 24 hr tablet TAKE 1 TABLET BY MOUTH EVERY NIGHT AT BEDTIME FOR OVERACTIVE BLADDER (Patient not taking: Reported on 08/03/2018)   No current facility-administered medications on file prior to visit.      Allergies:  Allergies  Allergen Reactions  . Augmentin [Amoxicillin-Pot Clavulanate] Diarrhea  . Doxycycline Nausea Only  . Egg White (Diagnostic) Nausea Only  . Prozac [Fluoxetine Hcl] Other (See Comments)    Disoriented  . Wellbutrin [Bupropion] Other (See Comments)    Insomnia/Nervousness     Medical History:  Past Medical History:  Diagnosis Date  . Allergy   . Anemia   . Anxiety   . Depression   . Hyperlipidemia   . Hypertension   . Lumbar stenosis   . Migraines   . Prediabetes    Family history- Reviewed and unchanged Social history- Reviewed and unchanged   Review of Systems:  Review of Systems  Constitutional: Negative for malaise/fatigue and weight loss.  HENT: Negative for hearing loss and tinnitus.   Eyes: Negative for blurred vision and double vision.  Respiratory: Negative  for cough, shortness of breath and wheezing.   Cardiovascular: Negative for chest pain, palpitations, orthopnea, claudication and leg swelling.  Gastrointestinal: Negative for abdominal pain, blood in stool, constipation, diarrhea, heartburn, melena, nausea and vomiting.  Genitourinary: Negative.   Musculoskeletal: Negative for joint pain and myalgias.  Skin: Negative for rash.  Neurological: Negative for dizziness, tingling, sensory change, weakness and headaches.  Endo/Heme/Allergies: Negative for polydipsia.  Psychiatric/Behavioral: Negative.   All other systems reviewed and are negative.     Physical Exam: BP 132/76   Pulse 71   Temp 98.2 F (36.8 C)   Ht 5' 3.5" (1.613 m)   Wt 154 lb (69.9 kg)   SpO2 96%   BMI 26.85 kg/m  Wt Readings from Last 3 Encounters:  08/03/18 154 lb (69.9 kg)  08/01/18 150 lb (68 kg)  07/02/18 150 lb (68 kg)   General Appearance: Well nourished, in no apparent distress. Eyes: PERRLA, EOMs, conjunctiva no swelling or erythema Sinuses: No Frontal/maxillary tenderness ENT/Mouth: Ext aud canals clear, TMs without erythema, bulging. No erythema, swelling, or exudate on post pharynx.  Tonsils not swollen or erythematous.  Hearing normal.  Neck: Supple, thyroid normal.  Respiratory: Respiratory effort normal, BS equal bilaterally without rales, rhonchi, wheezing or stridor.  Cardio: RRR with no MRGs. Brisk peripheral pulses without edema.  Abdomen: Soft, + BS.  Non tender, no guarding, rebound, hernias, masses. Lymphatics: Non tender without lymphadenopathy.  Musculoskeletal: Full ROM, 5/5 strength, Normal gait Skin: Warm, dry without rashes, lesions, ecchymosis.  Neuro: Cranial nerves intact. No cerebellar symptoms.  Psych: Awake and oriented X 3, normal affect, Insight and Judgment appropriate.    Izora Ribas, NP 9:49 AM [facility]

## 2018-08-03 ENCOUNTER — Ambulatory Visit (INDEPENDENT_AMBULATORY_CARE_PROVIDER_SITE_OTHER): Payer: Medicare Other | Admitting: Orthopaedic Surgery

## 2018-08-03 ENCOUNTER — Ambulatory Visit (INDEPENDENT_AMBULATORY_CARE_PROVIDER_SITE_OTHER): Payer: Medicare Other | Admitting: Adult Health

## 2018-08-03 ENCOUNTER — Ambulatory Visit: Payer: Medicare Other | Admitting: Physical Therapy

## 2018-08-03 ENCOUNTER — Encounter: Payer: Self-pay | Admitting: Adult Health

## 2018-08-03 VITALS — BP 132/76 | HR 71 | Temp 98.2°F | Ht 63.5 in | Wt 154.0 lb

## 2018-08-03 DIAGNOSIS — M5442 Lumbago with sciatica, left side: Secondary | ICD-10-CM

## 2018-08-03 DIAGNOSIS — R262 Difficulty in walking, not elsewhere classified: Secondary | ICD-10-CM

## 2018-08-03 DIAGNOSIS — I10 Essential (primary) hypertension: Secondary | ICD-10-CM | POA: Diagnosis not present

## 2018-08-03 DIAGNOSIS — Z6826 Body mass index (BMI) 26.0-26.9, adult: Secondary | ICD-10-CM | POA: Diagnosis not present

## 2018-08-03 DIAGNOSIS — Z79899 Other long term (current) drug therapy: Secondary | ICD-10-CM

## 2018-08-03 DIAGNOSIS — F419 Anxiety disorder, unspecified: Secondary | ICD-10-CM | POA: Diagnosis not present

## 2018-08-03 DIAGNOSIS — M6281 Muscle weakness (generalized): Secondary | ICD-10-CM | POA: Diagnosis not present

## 2018-08-03 DIAGNOSIS — F3340 Major depressive disorder, recurrent, in remission, unspecified: Secondary | ICD-10-CM | POA: Diagnosis not present

## 2018-08-03 DIAGNOSIS — E559 Vitamin D deficiency, unspecified: Secondary | ICD-10-CM | POA: Diagnosis not present

## 2018-08-03 DIAGNOSIS — M25552 Pain in left hip: Secondary | ICD-10-CM | POA: Diagnosis not present

## 2018-08-03 DIAGNOSIS — R7309 Other abnormal glucose: Secondary | ICD-10-CM | POA: Diagnosis not present

## 2018-08-03 DIAGNOSIS — E782 Mixed hyperlipidemia: Secondary | ICD-10-CM | POA: Diagnosis not present

## 2018-08-03 DIAGNOSIS — N182 Chronic kidney disease, stage 2 (mild): Secondary | ICD-10-CM | POA: Diagnosis not present

## 2018-08-03 IMAGING — MR MR LUMBAR SPINE W/O CM
4 of 5 series · 24 of 48 positions shown · non-contrast
Comparison: 02/22/2014

CLINICAL DATA: Chronic low back pain due to scoliosis. Pain extends
to the right leg with numbness and tingling.

EXAM:
MRI LUMBAR SPINE WITHOUT CONTRAST
TECHNIQUE: Multiplanar, multisequence MR imaging of the lumbar spine was
performed. No intravenous contrast was administered.

[Series 3: T2 · sagittal · 4.0mm · 0.55mm/px · 6 of 18 slices shown (1 of 2)]
[im 1/18]
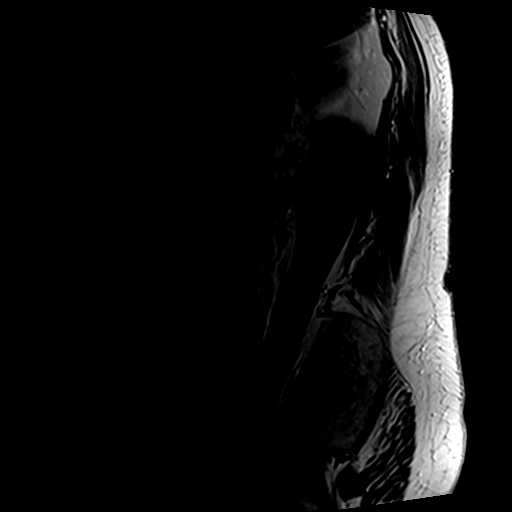
[im 4/18]
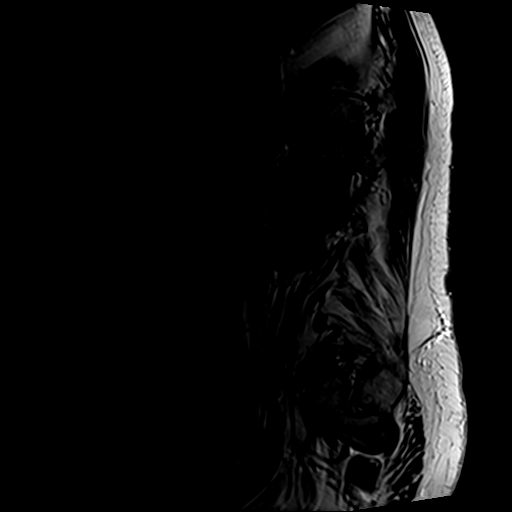
[im 7/18]
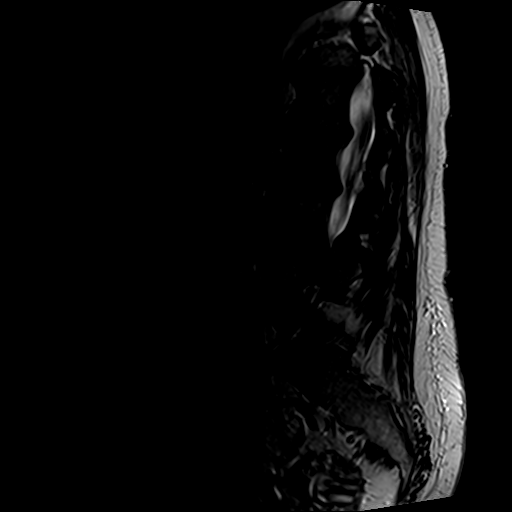
[im 11/18]
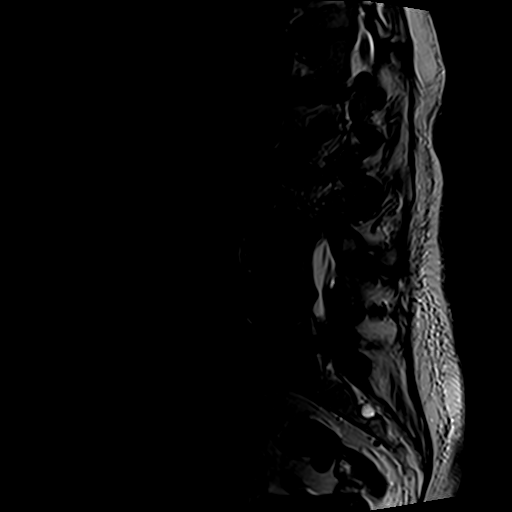
[im 14/18]
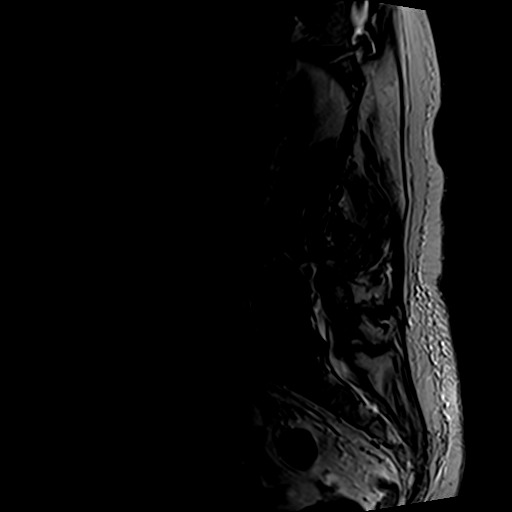
[im 18/18]
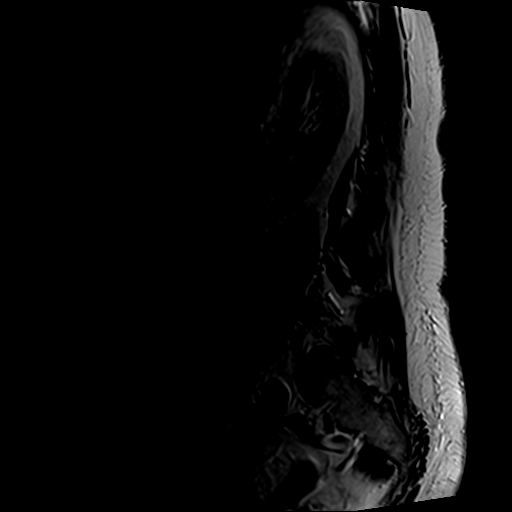

[Series 4: T1 · sagittal · 4.0mm · 0.55mm/px · 7 of 18 slices shown (1 of 2)]
[im 1/18]
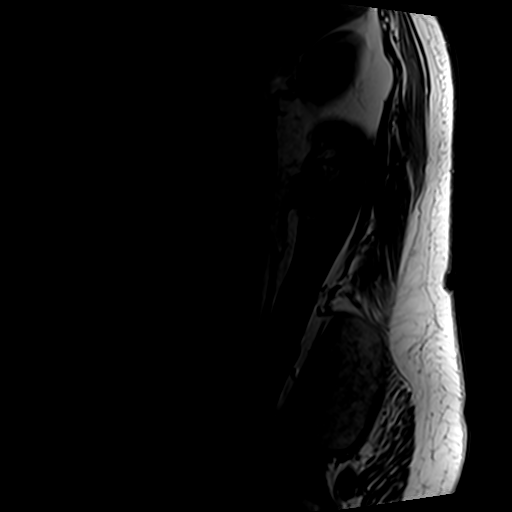
[im 3/18]
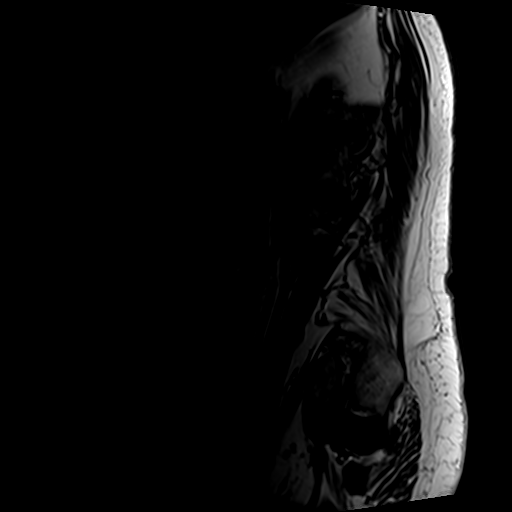
[im 6/18]
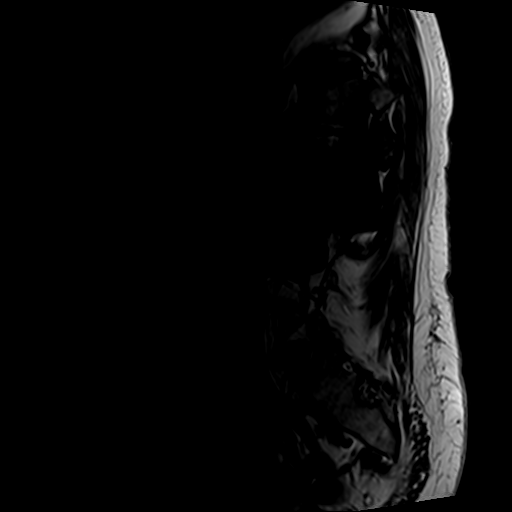
[im 9/18]
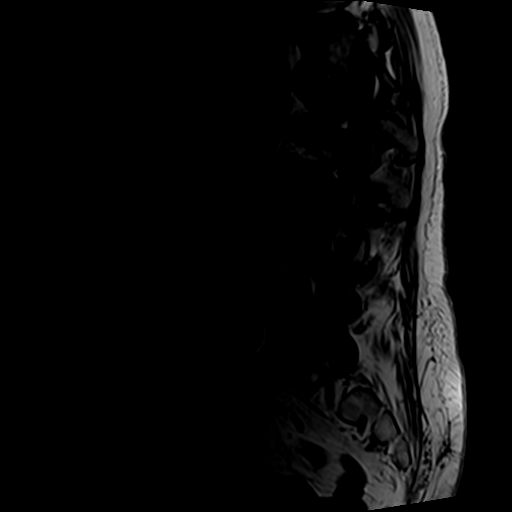
[im 12/18]
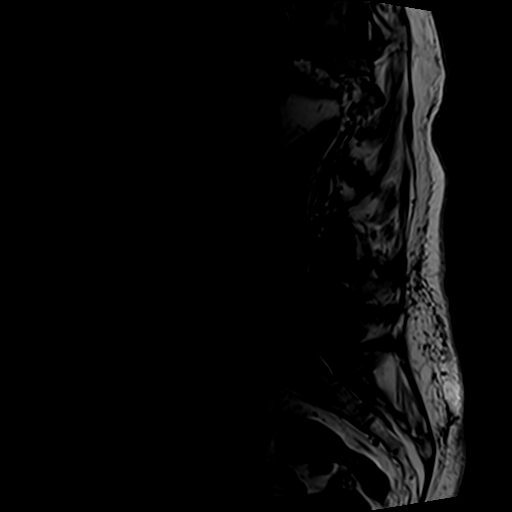
[im 15/18]
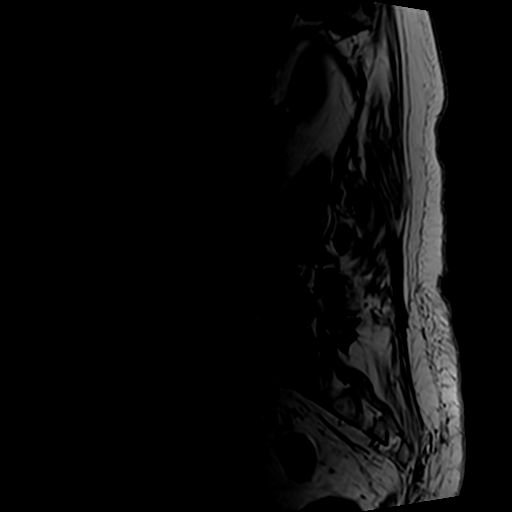
[im 18/18]
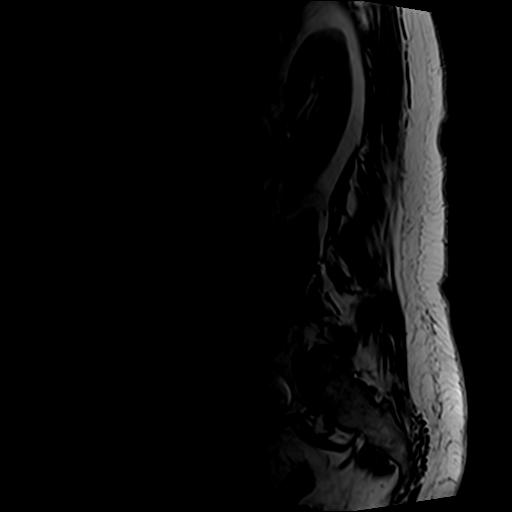

[Series 6: T2 · axial · 4.0mm · 0.70mm/px · z∈[-65,+124]mm · 8 of 37 slices shown (2 of 2)]
[im 1/37]
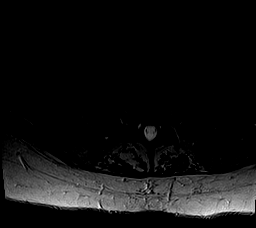
[im 6/37]
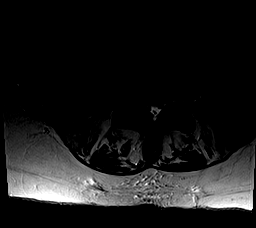
[im 12/37]
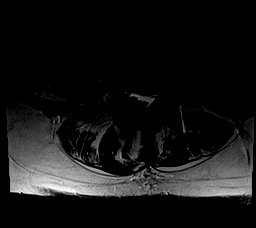
[im 17/37]
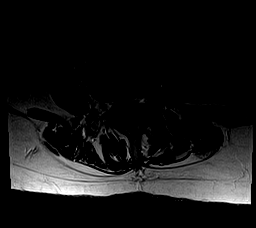
[im 20/37]
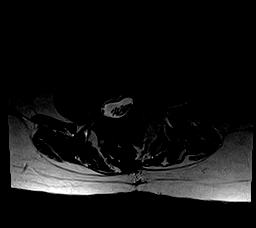
[im 25/37]
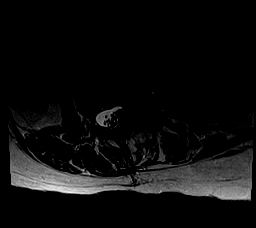
[im 31/37]
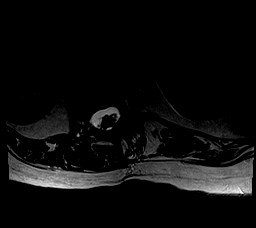
[im 37/37]
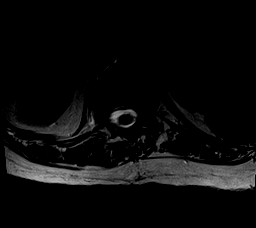

[Series 7: T1 · axial · 4.0mm · 0.35mm/px · z∈[-40,+93]mm · 3 of 37 slices shown (2 of 2)]
[im 6/37]
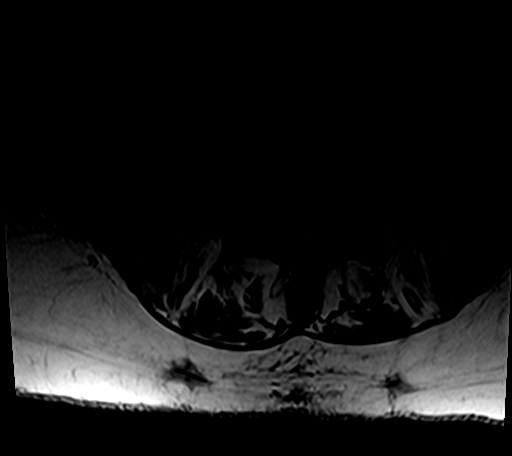
[im 20/37]
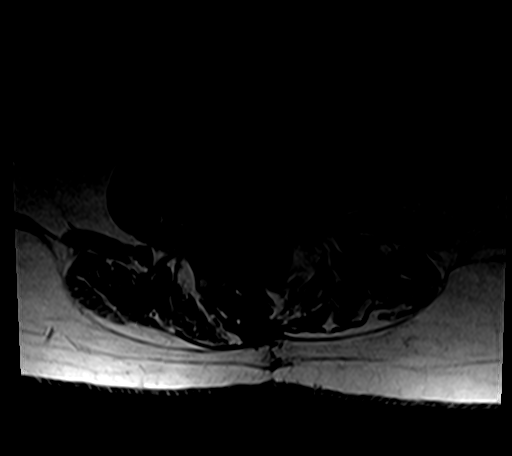
[im 31/37]
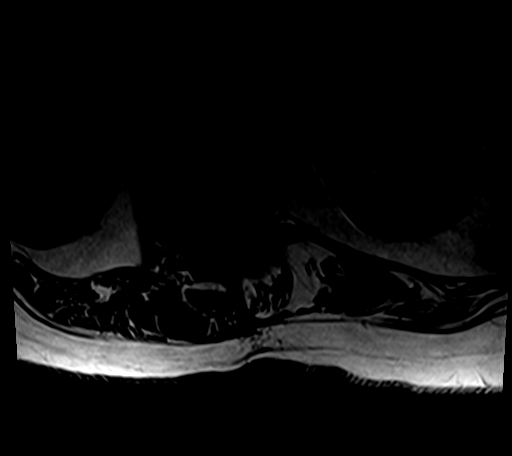

[24 of 48 positions shown; findings below may reference images not displayed]

FINDINGS: Segmentation:  5 lumbar type vertebral bodies.

Alignment: Thoracolumbar curvature convex to the right with the apex
at L2.

Vertebrae:  No spinal fracture or primary bone lesion.

Conus medullaris: Extends to the L1-2 level and appears normal.

Paraspinal and other soft tissues: Negative

Disc levels:

T12-L1:  Disc bulge more towards the left.  No neural compression.

L1-2: Disc bulge more towards the left. Mild facet arthritis on the
left. Mild narrowing of left lateral recess but no visible neural
compression. Mild discogenic edema of the endplates on the left
which could contribute to back pain.

L2-3: Endplate osteophytes and bulging of the disc more towards the
left. Facet degeneration worse on the left. Mild stenosis of the
left lateral recess and intervertebral foramen on the left.

L3-4: Bulging of the disc. Bilateral facet and ligamentous
hypertrophy. Multifactorial spinal stenosis at this level that could
cause neural compression on either or both sides.

L4-5: Bulging of the disc more towards the right. Facet arthropathy
worse on the right. Right lateral recess stenosis that could cause
neural compression.

L5-S1: Endplate osteophytes and chronic disc herniation more
pronounced towards the right. Facet arthropathy worse on the right.
Stenosis of the subarticular lateral recess on the right in the
intervertebral foramen on the right likely to cause neural
compression.

Compared to the study of 0498, the right-sided degenerative changes
at L5-S[DATE] have worsened slightly. The stenosis at L3-4 also
appears slightly more pronounced.
IMPRESSION: Worsening of degenerative change on the right at L5-S1. Endplate
osteophytes and protruding disc and right-sided facet arthropathy.
Lateral recess and foraminal stenosis on the right that could
compress the right L5 and S1 nerve roots.

L4-5: Right lateral recess stenosis due to bulging of the disc and
right-sided predominant facet arthropathy could cause neural
compression.

L3-4: Multifactorial stenosis because of endplate osteophytes,
bulging of the disc and facet arthropathy could cause neural
compression on either or both sides. Slight worsening since the
study of 0498.

L1-2 and L2-3: Left-sided predominant degenerative changes without
apparent progression.

## 2018-08-03 NOTE — Patient Instructions (Signed)
Access Code: [email or web address]  URL: [email or web address]  Date: 08/03/2018  Prepared by: Lovett Calender   Exercises  Supine Hamstring Stretch with Strap - 3 reps - 1 sets - 30 hold - 2x daily - 6x weekly  Supine Single Knee to Chest - 3 sets - 30 hold - 2x daily - 6x weekly  Seated Piriformis Stretch - 3 reps - 1 sets - 30 hold - 2x daily - 6x weekly  Seated Figure 4 Piriformis Stretch - 10 reps - 3 sets - 1x daily - 7x weekly  Clamshell - 10 reps - 3 sets - 2x daily - 6x weekly  Supine Single Bent Knee Fallout - 10 reps - 1 sets - 1x daily - 7x weekly  Supine Heel Slides - 10 reps - 1 sets - 1x daily - 7x weekly  Wall Push Up - 10 reps - 1 sets - 3x daily - 7x weekly
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
## 2018-08-04 ENCOUNTER — Other Ambulatory Visit: Payer: Self-pay | Admitting: Internal Medicine

## 2018-08-04 LAB — CBC WITH DIFFERENTIAL/PLATELET
ABSOLUTE MONOCYTES: 740 {cells}/uL (ref 200–950)
BASOS ABS: 44 {cells}/uL (ref 0–200)
BASOS PCT: 0.5 %
EOS ABS: 104 {cells}/uL (ref 15–500)
Eosinophils Relative: 1.2 %
HCT: 43.5 % (ref 35.0–45.0)
Hemoglobin: 14.7 g/dL (ref 11.7–15.5)
Lymphs Abs: 1844 cells/uL (ref 850–3900)
MCH: 31.1 pg (ref 27.0–33.0)
MCHC: 33.8 g/dL (ref 32.0–36.0)
MCV: 92 fL (ref 80.0–100.0)
MONOS PCT: 8.5 %
MPV: 10.4 fL (ref 7.5–12.5)
NEUTROS PCT: 68.6 %
Neutro Abs: 5968 cells/uL (ref 1500–7800)
PLATELETS: 321 10*3/uL (ref 140–400)
RBC: 4.73 10*6/uL (ref 3.80–5.10)
RDW: 13.1 % (ref 11.0–15.0)
TOTAL LYMPHOCYTE: 21.2 %
WBC: 8.7 10*3/uL (ref 3.8–10.8)

## 2018-08-04 LAB — COMPLETE METABOLIC PANEL WITH GFR
AG Ratio: 2 (calc) (ref 1.0–2.5)
ALBUMIN MSPROF: 4.4 g/dL (ref 3.6–5.1)
ALT: 19 U/L (ref 6–29)
AST: 19 U/L (ref 10–35)
Alkaline phosphatase (APISO): 45 U/L (ref 33–130)
BUN: 12 mg/dL (ref 7–25)
CALCIUM: 9.8 mg/dL (ref 8.6–10.4)
CO2: 28 mmol/L (ref 20–32)
Chloride: 103 mmol/L (ref 98–110)
Creat: 0.9 mg/dL (ref 0.60–0.93)
GFR, EST AFRICAN AMERICAN: 75 mL/min/{1.73_m2} (ref >=60)
GFR, EST NON AFRICAN AMERICAN: 64 mL/min/{1.73_m2} (ref >=60)
GLUCOSE: 82 mg/dL (ref 65–99)
Globulin: 2.2 g/dL (calc) (ref 1.9–3.7)
Potassium: 3.7 mmol/L (ref 3.5–5.3)
Sodium: 140 mmol/L (ref 135–146)
TOTAL PROTEIN: 6.6 g/dL (ref 6.1–8.1)
Total Bilirubin: 0.6 mg/dL (ref 0.2–1.2)

## 2018-08-04 LAB — TSH: TSH: 4.22 mIU/L (ref 0.40–4.50)

## 2018-08-04 LAB — LIPID PANEL
Cholesterol: 141 mg/dL (ref <200)
HDL: 56 mg/dL (ref >50)
LDL Cholesterol (Calc): 67 mg/dL (calc)
NON-HDL CHOLESTEROL (CALC): 85 mg/dL (ref <130)
TRIGLYCERIDES: 92 mg/dL (ref <150)
Total CHOL/HDL Ratio: 2.5 (calc) (ref <5.0)

## 2018-08-04 LAB — MAGNESIUM: Magnesium: 2 mg/dL (ref 1.5–2.5)

## 2018-08-16 ENCOUNTER — Encounter: Payer: Self-pay | Admitting: Physical Therapy

## 2018-08-16 ENCOUNTER — Ambulatory Visit: Payer: Medicare Other | Attending: Orthopedic Surgery | Admitting: Physical Therapy

## 2018-08-16 DIAGNOSIS — M25552 Pain in left hip: Secondary | ICD-10-CM | POA: Insufficient documentation

## 2018-08-16 DIAGNOSIS — R262 Difficulty in walking, not elsewhere classified: Secondary | ICD-10-CM | POA: Insufficient documentation

## 2018-08-16 DIAGNOSIS — M5442 Lumbago with sciatica, left side: Secondary | ICD-10-CM | POA: Diagnosis not present

## 2018-08-16 DIAGNOSIS — M6281 Muscle weakness (generalized): Secondary | ICD-10-CM | POA: Diagnosis not present

## 2018-08-16 NOTE — Therapy (Signed)
[facility] [address], [place], [place], [address] Phone: [phone]   Fax:  [phone]  Physical Therapy Treatment  Patient Details  Name: Rebekah Hamilton MRN: [id number] Date of Birth: 06-25-1946 Referring Provider (PT): Biagio Borg, [place], [place] MD   Encounter Date: 08/16/2018  PT End of Session - 08/16/18 1443    Visit Number  8    Number of Visits  10    Date for PT Re-Evaluation  09/14/18    Authorization Type  MCR    PT Start Time  1443    PT Stop Time  1524    PT Time Calculation (min)  41 min    Activity Tolerance  Patient tolerated treatment well    Behavior During Therapy  [facility] for tasks assessed/performed       Past Medical History:  Diagnosis Date  . Allergy   . Anemia   . Anxiety   . Depression   . Hyperlipidemia   . Hypertension   . Lumbar stenosis   . Migraines   . Prediabetes     Past Surgical History:  Procedure Laterality Date  . BREAST LUMPECTOMY Bilateral   . CHOLECYSTECTOMY  1999    There were no vitals filed for this visit.  Subjective Assessment - 08/16/18 1448    Subjective  Up and down stairs is painful and I notice when carrying something because I can't use the banister    Currently in Pain?  Yes    Pain Score  1     Pain Location  Hip    Pain Orientation  Left    Pain Descriptors / Indicators  Aching    Pain Onset  More than a month ago    Pain Frequency  Intermittent    Multiple Pain Sites  No                       [facility] Adult PT Treatment/Exercise - 08/16/18 0001      Lumbar Exercises: Stretches   Lower Trunk Rotation  5 reps;10 seconds      Lumbar Exercises: Supine   Clam  20 reps    Clam Limitations  green single leg 20x each side    Bent Knee Raise  20 reps    Dead Bug  20 reps   bent knee raise with UE flexion using 1lb     Moist Heat Therapy   Number Minutes Moist Heat  12 Minutes    Moist Heat Location  Lumbar Spine       Electrical Stimulation   Electrical Stimulation Location  left glute    Electrical Stimulation Action  IFC    Electrical Stimulation Parameters  to tolerance 80m    Electrical Stimulation Goals  Pain      Manual Therapy   Soft tissue mobilization  left gluteals and piriformis; lumbar paraspinals bilat       Trigger Point Dry Needling - 08/16/18 1519    Consent Given?  Yes    Muscles Treated Lower Body  Gluteus minimus;Gluteus maximus;Piriformis   lumbar multifidi Rt side; glutes Lt side only   Gluteus Maximus Response  Twitch response elicited;Palpable increased muscle length    Gluteus Minimus Response  Twitch response elicited;Palpable increased muscle length    Piriformis Response  Twitch response elicited;Palpable increased muscle length                PT Long Term Goals - 08/03/18  5400      PT LONG TERM GOAL #1   Title  independent with HEP.    Status  On-going    Target Date  09/14/18      PT LONG TERM GOAL #2   Title  walking community distances >15-20 min with improved weight on left leg.     Baseline  can't walk very long, 10-15 min at grocery store    Status  On-going    Target Date  09/14/18      PT LONG TERM GOAL #3   Title  pain with daily activties in left leg decreased >/= 50% due to improved posture and strength    Baseline  15-20% less    Time  6    Period  Weeks    Status  On-going    Target Date  09/14/18      PT LONG TERM GOAL #4   Title  Able to walk up/down at least one flight of stairs reciprocally without increase pain.     Baseline  walking reciprocally sometimes, can do one flight no pain    Time  6    Period  Weeks    Status  Partially Met    Target Date  09/14/18            Plan - 08/16/18 1527    Clinical Impression Statement  Pt still feeling improvement overall.  She had slight plateau since traveling.  She is also back at work and sitting more. Pt responded well from manual treatment today with release to muscle  spasms in glutes and piriformis.  Pt will benefit from skilled PT to continue working towards functional goals by increased LE and hip strength for functional activities.    PT Treatment/Interventions  Cryotherapy;Electrical Stimulation;Iontophoresis 49m/ml Dexamethasone;Moist Heat;Traction;Ultrasound;Therapeutic activities;Therapeutic exercise;Neuromuscular re-education;Manual techniques;Passive range of motion;Dry needling;Taping;Spinal Manipulations;Joint Manipulations    PT Next Visit Plan  f/u on DN #2, progress core and hip strength and stability    PT Home Exercise Plan  [email or web address]    Consulted and Agree with Plan of Care  Patient       Patient will benefit from skilled therapeutic intervention in order to improve the following deficits and impairments:  Decreased activity tolerance, Decreased endurance, Decreased range of motion, Decreased strength, Difficulty walking, Impaired flexibility, Postural dysfunction, Pain, Increased muscle spasms  Visit Diagnosis: Pain in left hip  Acute left-sided low back pain with left-sided sciatica  Muscle weakness (generalized)  Difficulty in walking, not elsewhere classified     Problem List Patient Active Problem List   Diagnosis Date Noted  . Chronic left-sided low back pain with left-sided sciatica 07/02/2018  . OAB (overactive bladder) 04/02/2018  . CKD (chronic kidney disease), symptom management only, stage 2 (mild) 09/24/2017  . Encounter for Medicare annual wellness exam 06/02/2015  . Glaucoma 06/02/2015  . Vitamin D deficiency 10/21/2014  . Medication management 12/01/2013  . Hyperlipidemia   . Hypertension   . Other abnormal glucose   . Migraines   . Allergy   . Anxiety   . Depression, major, recurrent, in remission ([place]   . Lumbar stenosis     [name] PT 08/16/2018, 3:29 PM  [facility] [address] [place] [address]Phone: [phone]   Fax:  [phone] Name: Rebekah RenvilleMRN: [id number] of Birth: 707/25/1947

## 2018-08-21 ENCOUNTER — Ambulatory Visit: Payer: Medicare Other | Admitting: Physical Therapy

## 2018-08-21 ENCOUNTER — Encounter: Payer: Self-pay | Admitting: Physical Therapy

## 2018-08-21 DIAGNOSIS — M5442 Lumbago with sciatica, left side: Secondary | ICD-10-CM

## 2018-08-21 DIAGNOSIS — M6281 Muscle weakness (generalized): Secondary | ICD-10-CM

## 2018-08-21 DIAGNOSIS — R262 Difficulty in walking, not elsewhere classified: Secondary | ICD-10-CM | POA: Diagnosis not present

## 2018-08-21 DIAGNOSIS — M25552 Pain in left hip: Secondary | ICD-10-CM

## 2018-08-21 NOTE — Therapy (Signed)
[facility] [address] [place], [place], [address] Phone: [phone]   Fax:  [phone]  Physical Therapy Treatment  Patient Details  Name: Rebekah Hamilton MRN: [id number] Date of Birth: 1946-03-24 Referring Provider (PT): Biagio Borg, [place], [place] MD   Encounter Date: 08/21/2018  PT End of Session - 08/21/18 0803    Visit Number  9    Number of Visits  10    Date for PT Re-Evaluation  09/14/18    Authorization Type  MCR    PT Start Time  0801    PT Stop Time  0841    PT Time Calculation (min)  40 min    Activity Tolerance  Patient tolerated treatment well    Behavior During Therapy  [facility] for tasks assessed/performed       Past Medical History:  Diagnosis Date  . Allergy   . Anemia   . Anxiety   . Depression   . Hyperlipidemia   . Hypertension   . Lumbar stenosis   . Migraines   . Prediabetes     Past Surgical History:  Procedure Laterality Date  . BREAST LUMPECTOMY Bilateral   . CHOLECYSTECTOMY  1999    There were no vitals filed for this visit.  Subjective Assessment - 08/21/18 0805    Subjective  I feel like my leg is weak getting up the stairs especially that first step    Patient Stated Goals  improve pain and activity tolerance    Currently in Pain?  No/denies                       [facility] Adult PT Treatment/Exercise - 08/21/18 0001      Lumbar Exercises: Stretches   Active Hamstring Stretch  Left;2 reps;20 seconds    Figure 4 Stretch  2 reps;30 seconds;Seated      Lumbar Exercises: Machines for Strengthening   Leg Press  50 lb bilat x 20; 20lb x 20 Lt LE      Knee/Hip Exercises: Aerobic   Recumbent Bike  L2 x 5 min; L3 x 1 min      Knee/Hip Exercises: Standing   Hip Flexion  Stengthening;Right;Left;10 reps;Knee straight   yellow band   Hip ADduction  Strengthening;Right;Left;10 reps   yellow band   Hip Abduction  Stengthening;Right;Left;10 reps;Knee  straight   yellow band   Lateral Step Up  Right;Left;10 reps;Step Height: 6";Hand Hold: 2    Forward Step Up  Right;Left;10 reps;Step Height: 6";Hand Hold: 2      Knee/Hip Exercises: Supine   Other Supine Knee/Hip Exercises  hip flex, abd, ext, add - 10x each Lt 1.5 lb                  PT Long Term Goals - 08/21/18 0804      PT LONG TERM GOAL #1   Title  independent with HEP.      PT LONG TERM GOAL #2   Title  walking community distances >15-20 min with improved weight on left leg.     Baseline  can't walk very long, 10-15 min at grocery store, walk a couple minutes up and down the hallway            Plan - 08/21/18 0808    Clinical Impression Statement  Pt is having no pain today.  She was able to perform exercises and demonstrates muscle fatigue but monitored for pain throughout.  Step  ups produced some tingling down left leg but subsided after exercises.  Pt will benefit from strengthening with skilled PT and progress according to POC.    PT Treatment/Interventions  Cryotherapy;Electrical Stimulation;Iontophoresis 4mg /ml Dexamethasone;Moist Heat;Traction;Ultrasound;Therapeutic activities;Therapeutic exercise;Neuromuscular re-education;Manual techniques;Passive range of motion;Dry needling;Taping;Spinal Manipulations;Joint Manipulations    PT Next Visit Plan  core and hip strength progress as tolerated    PT Home Exercise Plan  [email or web address]    Consulted and Agree with Plan of Care  Patient       Patient will benefit from skilled therapeutic intervention in order to improve the following deficits and impairments:  Decreased activity tolerance, Decreased endurance, Decreased range of motion, Decreased strength, Difficulty walking, Impaired flexibility, Postural dysfunction, Pain, Increased muscle spasms  Visit Diagnosis: Pain in left hip  Acute left-sided low back pain with left-sided sciatica  Muscle weakness (generalized)  Difficulty in walking, not elsewhere  classified     Problem List Patient Active Problem List   Diagnosis Date Noted  . Chronic left-sided low back pain with left-sided sciatica 07/02/2018  . OAB (overactive bladder) 04/02/2018  . CKD (chronic kidney disease), symptom management only, stage 2 (mild) 09/24/2017  . Encounter for Medicare annual wellness exam 06/02/2015  . Glaucoma 06/02/2015  . Vitamin D deficiency 10/21/2014  . Medication management 12/01/2013  . Hyperlipidemia   . Hypertension   . Other abnormal glucose   . Migraines   . Allergy   . Anxiety   . Depression, major, recurrent, in remission ([place])   . Lumbar stenosis     Zannie Cove, PT 08/21/2018, 11:11 AM  [facility] [address] [place], [place], [address] Phone: [phone]   Fax:  [phone]  Name: Rebekah Hamilton MRN: [id number] Date of Birth: [birth year]

## 2018-08-29 ENCOUNTER — Ambulatory Visit: Payer: Medicare Other | Admitting: Physical Therapy

## 2018-08-29 DIAGNOSIS — M6281 Muscle weakness (generalized): Secondary | ICD-10-CM | POA: Diagnosis not present

## 2018-08-29 DIAGNOSIS — M25552 Pain in left hip: Secondary | ICD-10-CM

## 2018-08-29 DIAGNOSIS — M5442 Lumbago with sciatica, left side: Secondary | ICD-10-CM | POA: Diagnosis not present

## 2018-08-29 DIAGNOSIS — R262 Difficulty in walking, not elsewhere classified: Secondary | ICD-10-CM

## 2018-08-29 NOTE — Therapy (Signed)
[facility] [address], [place], [place], [address] Phone: [phone]   Fax:  [phone]  Physical Therapy Treatment Progress Note Reporting Period 06/28/18 to 08/29/18  See note below for Objective Data and Assessment of Progress/Goals.      Patient Details  Name: Rebekah Hamilton MRN: [id number] Date of Birth: 07-24-1946 Referring Provider (PT): Biagio Borg, [place], whitfield MD   Encounter Date: 08/29/2018  PT End of Session - 08/29/18 0801    Visit Number  10    Number of Visits  10    Date for PT Re-Evaluation  09/14/18    Authorization Type  MCR    PT Start Time  0801    PT Stop Time  0842    PT Time Calculation (min)  41 min    Activity Tolerance  Patient tolerated treatment well    Behavior During Therapy  [facility] for tasks assessed/performed       Past Medical History:  Diagnosis Date  . Allergy   . Anemia   . Anxiety   . Depression   . Hyperlipidemia   . Hypertension   . Lumbar stenosis   . Migraines   . Prediabetes     Past Surgical History:  Procedure Laterality Date  . BREAST LUMPECTOMY Bilateral   . CHOLECYSTECTOMY  1999    There were no vitals filed for this visit.  Subjective Assessment - 08/29/18 0804    Subjective  My leg has been feeling pretty good.  My legs still feel weak and going upstairs still hurts.      Diagnostic tests  hip x-ray: SI DJD bilaterally,calcification at the greater trochanteric region on the left. Then MRI shows: "mod to severe stenosis worse on Lt L2-3, bilat L3-4, worse on Rt L4-5 and L5-S1, also severe thoracolumbar scoliosis    Patient Stated Goals  improve pain and activity tolerance    Currently in Pain?  No/denies                       [facility] Adult PT Treatment/Exercise - 08/29/18 0001      Lumbar Exercises: Stretches   Active Hamstring Stretch  Left;2 reps;20 seconds    Other Lumbar Stretch Exercise  quad stretch in prone,  adductor stretch supine - 2 x 30 sec hold      Lumbar Exercises: Supine   Advanced Lumbar Stabilization Limitations  90 deg toe taps; SLR circles both ways      Knee/Hip Exercises: Aerobic   Recumbent Bike  L1 x 9 min; L3 x 1 min   PT present for status update     Knee/Hip Exercises: Supine   Other Supine Knee/Hip Exercises  hip flex, abd, ext, add - 20x each bil 2lb                  PT Long Term Goals - 08/29/18 3295      PT LONG TERM GOAL #1   Title  independent with HEP.    Status  On-going      PT LONG TERM GOAL #2   Title  walking community distances >15-20 min with improved weight on left leg.     Baseline  not walking because of weather and it is too dark, I was able to walk around [place] for 30 minutes    Status  Achieved      PT LONG TERM GOAL #3   Title  pain with daily  activties in left leg decreased >/= 50% due to improved posture and strength    Baseline  40% less pain    Status  Partially Met      PT LONG TERM GOAL #4   Title  Able to walk up/down at least one flight of stairs reciprocally without increase pain.     Baseline  can walk reciprocally most of the time, feels weak though    Status  Partially Met            Plan - 08/29/18 0824    Clinical Impression Statement  Pt has met and partially met all of her long term goals. Pt if feeling 40% improved at this time.  She is able to walk for 30 minutes comfortably.  PT focused on mat exercises today due to patient experiencing a migrain HA today.  She continues to have some difficutly with stairs and will benefit from skilled PT to work towards functional goals and strength for maximum function    PT Treatment/Interventions  Cryotherapy;Electrical Stimulation;Iontophoresis 13m/ml Dexamethasone;Moist Heat;Traction;Ultrasound;Therapeutic activities;Therapeutic exercise;Neuromuscular re-education;Manual techniques;Passive range of motion;Dry needling;Taping;Spinal Manipulations;Joint Manipulations     PT Next Visit Plan  core and hip strength progress as tolerated    PT Home Exercise Plan  [email or web address]    Consulted and Agree with Plan of Care  Patient       Patient will benefit from skilled therapeutic intervention in order to improve the following deficits and impairments:  Decreased activity tolerance, Decreased endurance, Decreased range of motion, Decreased strength, Difficulty walking, Impaired flexibility, Postural dysfunction, Pain, Increased muscle spasms  Visit Diagnosis: Pain in left hip  Acute left-sided low back pain with left-sided sciatica  Muscle weakness (generalized)  Difficulty in walking, not elsewhere classified     Problem List Patient Active Problem List   Diagnosis Date Noted  . Chronic left-sided low back pain with left-sided sciatica 07/02/2018  . OAB (overactive bladder) 04/02/2018  . CKD (chronic kidney disease), symptom management only, stage 2 (mild) 09/24/2017  . Encounter for Medicare annual wellness exam 06/02/2015  . Glaucoma 06/02/2015  . Vitamin D deficiency 10/21/2014  . Medication management 12/01/2013  . Hyperlipidemia   . Hypertension   . Other abnormal glucose   . Migraines   . Allergy   . Anxiety   . Depression, major, recurrent, in remission ([place]   . Lumbar stenosis     [name] PT 08/29/2018, 8:46 AM  [facility] [address] [place] [address]Phone: [phone]  Fax:  [phone] Name: JInez StantzMRN: [id number] of Birth: 730-Nov-1947

## 2018-08-31 ENCOUNTER — Ambulatory Visit: Payer: Medicare Other | Admitting: Physical Therapy

## 2018-08-31 ENCOUNTER — Encounter: Payer: Self-pay | Admitting: Physical Therapy

## 2018-08-31 DIAGNOSIS — M6281 Muscle weakness (generalized): Secondary | ICD-10-CM | POA: Diagnosis not present

## 2018-08-31 DIAGNOSIS — R262 Difficulty in walking, not elsewhere classified: Secondary | ICD-10-CM

## 2018-08-31 DIAGNOSIS — M25552 Pain in left hip: Secondary | ICD-10-CM

## 2018-08-31 DIAGNOSIS — M5442 Lumbago with sciatica, left side: Secondary | ICD-10-CM

## 2018-08-31 NOTE — Therapy (Signed)
[facility] [address], [place], [place], [address] Phone: [phone]   Fax:  [phone]  Physical Therapy Treatment  Patient Details  Name: Rebekah Hamilton MRN: [id number] Date of Birth: 21-Jul-1946 Referring Provider (PT): Biagio Borg, [place], whit[place] MD   Encounter Date: 08/31/2018  PT End of Session - 08/31/18 0803    Visit Number  11    Number of Visits  20    Date for PT Re-Evaluation  09/14/18    Authorization Type  MCR    PT Start Time  0801    PT Stop Time  0843    PT Time Calculation (min)  42 min    Activity Tolerance  Patient tolerated treatment well    Behavior During Therapy  [facility] for tasks assessed/performed       Past Medical History:  Diagnosis Date  . Allergy   . Anemia   . Anxiety   . Depression   . Hyperlipidemia   . Hypertension   . Lumbar stenosis   . Migraines   . Prediabetes     Past Surgical History:  Procedure Laterality Date  . BREAST LUMPECTOMY Bilateral   . CHOLECYSTECTOMY  1999    There were no vitals filed for this visit.  Subjective Assessment - 08/31/18 0804    Subjective  I have a little pain today, that is typical in the morning.    Diagnostic tests  hip x-ray: SI DJD bilaterally,calcification at the greater trochanteric region on the left. Then MRI shows: "mod to severe stenosis worse on Lt L2-3, bilat L3-4, worse on Rt L4-5 and L5-S1, also severe thoracolumbar scoliosis    Currently in Pain?  Yes    Pain Score  4     Pain Location  Hip    Pain Orientation  Left    Pain Descriptors / Indicators  Aching    Pain Type  Chronic pain    Pain Onset  More than a month ago    Pain Frequency  Intermittent                       [facility] Adult PT Treatment/Exercise - 08/31/18 0001      Lumbar Exercises: Stretches   Single Knee to Chest Stretch  Right;Left;2 reps;30 seconds    Figure 4 Stretch  2 reps;30 seconds;Seated      Lumbar Exercises:  Aerobic   Nustep  level 2 seat 7, 6 min      Lumbar Exercises: Machines for Strengthening   Leg Press  50 lb bilat x 20; 60lb bil x 10; 20lb x 20 Lt LE 1plate +15 Lt LE x 20      Lumbar Exercises: Supine   Dead Bug  20 reps   modified with 2lb for UE and LE     Knee/Hip Exercises: Standing   Forward Step Up  Right;Left;2 sets;10 reps;Step Height: 4"    Step Down  Right;Left;2 sets;10 reps;Step Height: 4"      Knee/Hip Exercises: Supine   Other Supine Knee/Hip Exercises  hip flex, abd, - 20x each bil 2lb                  PT Long Term Goals - 08/29/18 6962      PT LONG TERM GOAL #1   Title  independent with HEP.    Status  On-going      PT LONG TERM GOAL #2   Title  walking community distances >15-20 min with improved weight on left leg.     Baseline  not walking because of weather and it is too dark, I was able to walk around [place] for 30 minutes    Status  Achieved      PT LONG TERM GOAL #3   Title  pain with daily activties in left leg decreased >/= 50% due to improved posture and strength    Baseline  40% less pain    Status  Partially Met      PT LONG TERM GOAL #4   Title  Able to walk up/down at least one flight of stairs reciprocally without increase pain.     Baseline  can walk reciprocally most of the time, feels weak though    Status  Partially Met            Plan - 08/31/18 0904    Clinical Impression Statement  Pt was feeling better today.  No increased pain with steps and able to add resistence to leg press today.  Pt needed cues to stabilize pelvis and use glutes when doing step down.  Pt will continue to benefit from skilled PT to work on core and hip strength for improved function such as up and down stairs without pain.    PT Treatment/Interventions  Cryotherapy;Electrical Stimulation;Iontophoresis 10m/ml Dexamethasone;Moist Heat;Traction;Ultrasound;Therapeutic activities;Therapeutic exercise;Neuromuscular re-education;Manual  techniques;Passive range of motion;Dry needling;Taping;Spinal Manipulations;Joint Manipulations    PT Next Visit Plan  core and hip strength progress as tolerated    PT Home Exercise Plan  [email or web address]    Consulted and Agree with Plan of Care  Patient       Patient will benefit from skilled therapeutic intervention in order to improve the following deficits and impairments:  Decreased activity tolerance, Decreased endurance, Decreased range of motion, Decreased strength, Difficulty walking, Impaired flexibility, Postural dysfunction, Pain, Increased muscle spasms  Visit Diagnosis: Pain in left hip  Acute left-sided low back pain with left-sided sciatica  Muscle weakness (generalized)  Difficulty in walking, not elsewhere classified     Problem List Patient Active Problem List   Diagnosis Date Noted  . Chronic left-sided low back pain with left-sided sciatica 07/02/2018  . OAB (overactive bladder) 04/02/2018  . CKD (chronic kidney disease), symptom management only, stage 2 (mild) 09/24/2017  . Encounter for Medicare annual wellness exam 06/02/2015  . Glaucoma 06/02/2015  . Vitamin D deficiency 10/21/2014  . Medication management 12/01/2013  . Hyperlipidemia   . Hypertension   . Other abnormal glucose   . Migraines   . Allergy   . Anxiety   . Depression, major, recurrent, in remission ([place]   . Lumbar stenosis     [name] PT 08/31/2018, 9:07 AM  [facility] [address] [place] [address]Phone: [phone]  Fax:  [phone] Name: Rebekah DaudelinMRN: [id number] of Birth: 7October 24, 1947

## 2018-09-05 ENCOUNTER — Ambulatory Visit: Payer: Medicare Other | Admitting: Physical Therapy

## 2018-09-05 ENCOUNTER — Encounter: Payer: Self-pay | Admitting: Physical Therapy

## 2018-09-05 DIAGNOSIS — M6281 Muscle weakness (generalized): Secondary | ICD-10-CM

## 2018-09-05 DIAGNOSIS — M5442 Lumbago with sciatica, left side: Secondary | ICD-10-CM | POA: Diagnosis not present

## 2018-09-05 DIAGNOSIS — R262 Difficulty in walking, not elsewhere classified: Secondary | ICD-10-CM | POA: Diagnosis not present

## 2018-09-05 DIAGNOSIS — M25552 Pain in left hip: Secondary | ICD-10-CM | POA: Diagnosis not present

## 2018-09-05 NOTE — Patient Instructions (Signed)
Access Code: [email or web address]  URL: [email or web address]  Date: 09/05/2018  Prepared by: Lovett Calender   Exercises  Seated Piriformis Stretch - 3 reps - 1 sets - 30 hold - 2x daily - 6x weekly  Seated Figure 4 Piriformis Stretch - 10 reps - 3 sets - 1x daily - 7x weekly  Seated Hamstring Stretch - 3 reps - 1 sets - 30 sec hold - 1x daily - 7x weekly  Standing Hamstring Stretch with Step - 3 reps - 1 sets - 30 sec hold - 1x daily - 7x weekly  Sit to Stand with Arm Swing - 10 reps - 1 sets - 3x daily - 7x weekly  Wall Push Up - 10 reps - 1 sets - 3x daily - 7x weekly

## 2018-09-05 NOTE — Therapy (Signed)
[facility] [address] [place], [place], [address] Phone: [phone]   Fax:  [phone]  Physical Therapy Treatment  Patient Details  Name: Rebekah Hamilton MRN: [id number] Date of Birth: [birth year] Referring Provider (PT): Biagio Borg, [place], whit[place] MD   Encounter Date: 09/05/2018  PT End of Session - 09/05/18 0809    Visit Number  12    Date for PT Re-Evaluation  09/14/18    Authorization Type  MCR    PT Start Time  0802    PT Stop Time  0841    PT Time Calculation (min)  39 min    Activity Tolerance  Patient tolerated treatment well    Behavior During Therapy  [facility] for tasks assessed/performed       Past Medical History:  Diagnosis Date  . Allergy   . Anemia   . Anxiety   . Depression   . Hyperlipidemia   . Hypertension   . Lumbar stenosis   . Migraines   . Prediabetes     Past Surgical History:  Procedure Laterality Date  . BREAST LUMPECTOMY Bilateral   . CHOLECYSTECTOMY  1999    There were no vitals filed for this visit.  Subjective Assessment - 09/05/18 0804    Subjective  i have been sitting in the chair a lot so I have been doing the streches.  I had to work    Diagnostic tests  hip x-ray: SI DJD bilaterally,calcification at the greater trochanteric region on the left. Then MRI shows: "mod to severe stenosis worse on Lt L2-3, bilat L3-4, worse on Rt L4-5 and L5-S1, also severe thoracolumbar scoliosis    Currently in Pain?  No/denies                       [facility] Adult PT Treatment/Exercise - 09/05/18 0001      Lumbar Exercises: Stretches   Active Hamstring Stretch  Left;2 reps;20 seconds   standing at the steps     Lumbar Exercises: Aerobic   Nustep  level 2 seat 7, 6 min   PT present to get status and review HEP     Lumbar Exercises: Machines for Strengthening   Leg Press  60lb bil x 20;  bil 1plate +15 G31; bil 2x 10       Lumbar Exercises: Standing   Other Standing Lumbar Exercises  wall push up with cues to keep TrA engaged      Knee/Hip Exercises: Standing   Hip Abduction  Stengthening;Right;Left;Knee straight;10 reps   2lb; fatigues and felt some cramping - eased after stretches   Hip Extension  Stengthening;Right;Left;20 reps;Knee straight   2lb   Forward Step Up  Right;Left;Step Height: 8";5 reps   fatigues after 5-6 reps; no pain but increased hip drop     Knee/Hip Exercises: Seated   Hamstring Curl  Strengthening;Right;Left;2 sets;10 reps   green band   Sit to Sand  20 reps;without UE support   squat with chair behind; cue butt back and knees behind toes            PT Education - 09/05/18 0841    Education Details   Access Code: [email or web address]     Person(s) Educated  Patient    Methods  Explanation;Demonstration;Handout;Verbal cues    Comprehension  Verbalized understanding;Returned demonstration          PT Long Term Goals - 08/29/18 0807      PT LONG  TERM GOAL #1   Title  independent with HEP.    Status  On-going      PT LONG TERM GOAL #2   Title  walking community distances >15-20 min with improved weight on left leg.     Baseline  not walking because of weather and it is too dark, I was able to walk around [place] for 30 minutes    Status  Achieved      PT LONG TERM GOAL #3   Title  pain with daily activties in left leg decreased >/= 50% due to improved posture and strength    Baseline  40% less pain    Status  Partially Met      PT LONG TERM GOAL #4   Title  Able to walk up/down at least one flight of stairs reciprocally without increase pain.     Baseline  can walk reciprocally most of the time, feels weak though    Status  Partially Met            Plan - 09/05/18 0843    Clinical Impression Statement  Pt was able to increase resistance on the leg press.  She did well with sit to stand and felt like she would be able to add that to her day at work.  Patient is limited due to very busy work  schedule at this time so PT needing to problem solve with her on how to maximize her time to do HEP.  Pt felt better after treatment today.  She will continue to benefit from skilled PT to address strength impairments    PT Treatment/Interventions  Cryotherapy;Electrical Stimulation;Iontophoresis 69m/ml Dexamethasone;Moist Heat;Traction;Ultrasound;Therapeutic activities;Therapeutic exercise;Neuromuscular re-education;Manual techniques;Passive range of motion;Dry needling;Taping;Spinal Manipulations;Joint Manipulations    PT Next Visit Plan  core and hip strength progress as tolerated; f/u with revised HEP    PT Home Exercise Plan  [email or web address]    Consulted and Agree with Plan of Care  Patient       Patient will benefit from skilled therapeutic intervention in order to improve the following deficits and impairments:  Decreased activity tolerance, Decreased endurance, Decreased range of motion, Decreased strength, Difficulty walking, Impaired flexibility, Postural dysfunction, Pain, Increased muscle spasms  Visit Diagnosis: Pain in left hip  Acute left-sided low back pain with left-sided sciatica  Muscle weakness (generalized)  Difficulty in walking, not elsewhere classified     Problem List Patient Active Problem List   Diagnosis Date Noted  . Chronic left-sided low back pain with left-sided sciatica 07/02/2018  . OAB (overactive bladder) 04/02/2018  . CKD (chronic kidney disease), symptom management only, stage 2 (mild) 09/24/2017  . Encounter for Medicare annual wellness exam 06/02/2015  . Glaucoma 06/02/2015  . Vitamin D deficiency 10/21/2014  . Medication management 12/01/2013  . Hyperlipidemia   . Hypertension   . Other abnormal glucose   . Migraines   . Allergy   . Anxiety   . Depression, major, recurrent, in remission ([place]   . Lumbar stenosis     [name] PT 09/05/2018, 8:57 AM  [facility] [address]  [place] [address]Phone: [phone]  Fax:  [phone] Name: Rebekah LewterMRN: [id number] of Birth: [birth year]

## 2018-09-07 ENCOUNTER — Ambulatory Visit: Payer: Medicare Other | Admitting: Physical Therapy

## 2018-09-07 ENCOUNTER — Encounter: Payer: Self-pay | Admitting: Physical Therapy

## 2018-09-07 DIAGNOSIS — M5442 Lumbago with sciatica, left side: Secondary | ICD-10-CM

## 2018-09-07 DIAGNOSIS — M25552 Pain in left hip: Secondary | ICD-10-CM

## 2018-09-07 DIAGNOSIS — M6281 Muscle weakness (generalized): Secondary | ICD-10-CM | POA: Diagnosis not present

## 2018-09-07 DIAGNOSIS — R262 Difficulty in walking, not elsewhere classified: Secondary | ICD-10-CM | POA: Diagnosis not present

## 2018-09-07 NOTE — Therapy (Signed)
[facility] [address] [place], [place], [address] Phone: [phone]   Fax:  [phone]  Physical Therapy Treatment  Patient Details  Name: Rebekah Hamilton MRN: [id number] Date of Birth: [birth year] Referring Provider (PT): Biagio Borg, [place], whit[place] MD   Encounter Date: 09/07/2018  PT End of Session - 09/07/18 0806    Visit Number  13    Number of Visits  20    Date for PT Re-Evaluation  09/14/18    Authorization Type  MCR    PT Start Time  0803    PT Stop Time  0841    PT Time Calculation (min)  38 min    Activity Tolerance  Patient tolerated treatment well    Behavior During Therapy  [facility] for tasks assessed/performed       Past Medical History:  Diagnosis Date  . Allergy   . Anemia   . Anxiety   . Depression   . Hyperlipidemia   . Hypertension   . Lumbar stenosis   . Migraines   . Prediabetes     Past Surgical History:  Procedure Laterality Date  . BREAST LUMPECTOMY Bilateral   . CHOLECYSTECTOMY  1999    There were no vitals filed for this visit.  Subjective Assessment - 09/07/18 0829    Subjective  I am feeling sore.  Pt reports muscle soreness and no hip or leg pain    Patient Stated Goals  improve pain and activity tolerance    Currently in Pain?  No/denies                       [facility] Adult PT Treatment/Exercise - 09/07/18 0001      Lumbar Exercises: Machines for Strengthening   Leg Press  65lb bil 2x 20;  single leg 65lb 1x 10    seat 6     Knee/Hip Exercises: Standing   Hip Flexion  Stengthening;Right;Left;20 reps;Knee bent   2 lb   Hip Abduction  Stengthening;Right;Left;Knee straight;10 reps   2lb; fatigues and felt some cramping - cued to keep hip neut   Hip Extension  Stengthening;Right;Left;20 reps;Knee straight   2lb     Knee/Hip Exercises: Seated   Clamshell with TheraBand  Green   30x   Hamstring Curl  Strengthening;Right;Left;2 sets;10 reps    green band   Sit to Sand  20 reps;without UE support   squat with chair behind; butt back and knees behind toes     Knee/Hip Exercises: Supine   Other Supine Knee/Hip Exercises  hip flex, add, abd - 20x each bil 2lb                  PT Long Term Goals - 09/07/18 0831      PT LONG TERM GOAL #1   Title  independent with HEP    Status  On-going      PT LONG TERM GOAL #2   Title  walking community distances >15-20 min with improved weight on left leg.     Baseline  not walking because of weather and it is too dark, I was able to walk around [place] for 30 minutes    Status  Achieved      PT LONG TERM GOAL #3   Title  pain with daily activties in left leg decreased >/= 50% due to improved posture and strength    Baseline  at least 50%    Status  Achieved      PT LONG TERM GOAL #4   Title  Able to walk up/down at least one flight of stairs reciprocally without increase pain.     Baseline  the first step is still hard, I can walk reciprocally all the time now    Status  Achieved            Plan - 09/07/18 0847    Clinical Impression Statement  Pt met long term pain goal.  She is doing well and making progress with ability to now do strengthening without increased pain.  Pt was able to add more strengthening to her HEP.  She understands the need to continue to strengthen her LE on her own and is expected to transition to HEP within the next week.    PT Treatment/Interventions  Cryotherapy;Electrical Stimulation;Iontophoresis 44m/ml Dexamethasone;Moist Heat;Traction;Ultrasound;Therapeutic activities;Therapeutic exercise;Neuromuscular re-education;Manual techniques;Passive range of motion;Dry needling;Taping;Spinal Manipulations;Joint Manipulations    PT Next Visit Plan  FOTO, goals, HEP - discharge likely    PLunaand Agree with Plan of Care  Patient       Patient will benefit from skilled therapeutic intervention in order to  improve the following deficits and impairments:  Decreased activity tolerance, Decreased endurance, Decreased range of motion, Decreased strength, Difficulty walking, Impaired flexibility, Postural dysfunction, Pain, Increased muscle spasms  Visit Diagnosis: Pain in left hip  Acute left-sided low back pain with left-sided sciatica  Muscle weakness (generalized)  Difficulty in walking, not elsewhere classified     Problem List Patient Active Problem List   Diagnosis Date Noted  . Chronic left-sided low back pain with left-sided sciatica 07/02/2018  . OAB (overactive bladder) 04/02/2018  . CKD (chronic kidney disease), symptom management only, stage 2 (mild) 09/24/2017  . Encounter for Medicare annual wellness exam 06/02/2015  . Glaucoma 06/02/2015  . Vitamin D deficiency 10/21/2014  . Medication management 12/01/2013  . Hyperlipidemia   . Hypertension   . Other abnormal glucose   . Migraines   . Allergy   . Anxiety   . Depression, major, recurrent, in remission ([place]   . Lumbar stenosis     [name] PT 09/07/2018, 8:55 AM  [facility] [address] [place] [address]Phone: [phone]  Fax:  [phone] Name: Rebekah McdonnellMRN: [id number] of Birth: 705-18-1947

## 2018-09-10 NOTE — Addendum Note (Signed)
Addended by: Izora Ribas on: 09/10/2018 01:30 PM   Modules accepted: Level of Service

## 2018-09-12 ENCOUNTER — Encounter: Payer: Self-pay | Admitting: Physical Therapy

## 2018-09-12 ENCOUNTER — Ambulatory Visit: Payer: Medicare Other | Admitting: Physical Therapy

## 2018-09-12 DIAGNOSIS — M5442 Lumbago with sciatica, left side: Secondary | ICD-10-CM

## 2018-09-12 DIAGNOSIS — M6281 Muscle weakness (generalized): Secondary | ICD-10-CM | POA: Diagnosis not present

## 2018-09-12 DIAGNOSIS — M25552 Pain in left hip: Secondary | ICD-10-CM | POA: Diagnosis not present

## 2018-09-12 DIAGNOSIS — R262 Difficulty in walking, not elsewhere classified: Secondary | ICD-10-CM

## 2018-09-12 NOTE — Therapy (Signed)
[facility] [address], [address] [place], [place], [address] Phone: [phone]   Fax:  [phone]  Physical Therapy Treatment  Patient Details  Name: Rebekah Hamilton MRN: [id number] Date of Birth: 11/02/1945 Referring Provider (PT): Rebekah Petrarca, PA, whitfield MD   Encounter Date: 09/12/2018  PT End of Session - 09/12/18 0815    Visit Number  14    Number of Visits  20    Date for PT Re-Evaluation  09/14/18    Authorization Type  MCR    PT Start Time  0804    PT Stop Time  0844    PT Time Calculation (min)  40 min    Activity Tolerance  Patient tolerated treatment well    Behavior During Therapy  WFL for tasks assessed/performed       Past Medical History:  Diagnosis Date  . Allergy   . Anemia   . Anxiety   . Depression   . Hyperlipidemia   . Hypertension   . Lumbar stenosis   . Migraines   . Prediabetes     Past Surgical History:  Procedure Laterality Date  . BREAST LUMPECTOMY Bilateral   . CHOLECYSTECTOMY  1999    There were no vitals filed for this visit.  Subjective Assessment - 09/12/18 0818    Subjective  I still have pain in the morning and I was walking around for about an hour on Sunday and leg and back were hurting by mid-adfternoon.    Currently in Pain?  No/denies                       [facility] Adult PT Treatment/Exercise - 09/12/18 0001      Lumbar Exercises: Aerobic   Nustep  level 2 seat 7, 6 min   PT present to get status and review HEP     Lumbar Exercises: Machines for Strengthening   Leg Press  65lb bil 2x 20;  single leg 50lb 2x 10    seat 6   Other Lumbar Machine Exercise  sports cord side stepping 15# to the Rt and 20# to the left      Knee/Hip Exercises: Supine   Straight Leg Raises  Strengthening;Right;Left;2 sets;10 reps   2#     Knee/Hip Exercises: Sidelying   Hip ABduction  Strengthening;Right;Left;10 reps   2#   Hip ADduction   Strengthening;Right;Left;10 reps   2#     Knee/Hip Exercises: Prone   Hip Extension  Strengthening;Right;Left;10 reps                  PT Long Term Goals - 09/12/18 0852      PT LONG TERM GOAL #1   Title  independent with HEP    Status  Achieved      PT LONG TERM GOAL #2   Title  walking community distances >15-20 min with improved weight on left leg.     Baseline  walked for an hour on Sunday    Status  Achieved      PT LONG TERM GOAL #3   Title  pain with daily activties in left leg decreased >/= 50% due to improved posture and strength    Baseline  at least 50%    Status  Achieved      PT LONG TERM GOAL #4   Title  Able to walk up/down at least one flight of stairs reciprocally without increase pain.       Status  Achieved            Plan - 09/12/18 0841    Clinical Impression Statement  Pt met all goals.  She is still feeling pain at times when she is on her feet for long periods of time.  Pt is now independent with HEP and is recommended to continue with HEP and [facility]    PT Treatment/Interventions  Cryotherapy;Electrical Stimulation;Iontophoresis 44m/ml Dexamethasone;Moist Heat;Traction;Ultrasound;Therapeutic activities;Therapeutic exercise;Neuromuscular re-education;Manual techniques;Passive range of motion;Dry needling;Taping;Spinal Manipulations;Joint Manipulations    Consulted and Agree with Plan of Care  Patient       Patient will benefit from skilled therapeutic intervention in order to improve the following deficits and impairments:  Decreased activity tolerance, Decreased endurance, Decreased range of motion, Decreased strength, Difficulty walking, Impaired flexibility, Postural dysfunction, Pain, Increased muscle spasms  Visit Diagnosis: Pain in left hip  Acute left-sided low back pain with left-sided sciatica  Muscle weakness (generalized)  Difficulty in walking, not elsewhere  classified     Problem List Patient Active Problem List   Diagnosis Date Noted  . Chronic left-sided low back pain with left-sided sciatica 07/02/2018  . OAB (overactive bladder) 04/02/2018  . CKD (chronic kidney disease), symptom management only, stage 2 (mild) 09/24/2017  . Encounter for Medicare annual wellness exam 06/02/2015  . Glaucoma 06/02/2015  . Vitamin D deficiency 10/21/2014  . Medication management 12/01/2013  . Hyperlipidemia   . Hypertension   . Other abnormal glucose   . Migraines   . Allergy   . Anxiety   . Depression, major, recurrent, in remission ([place]   . Lumbar stenosis     JZannie Hamilton PT 09/12/2018, 8:54 AM  [facility] [address] [place] [id number]: [phone]  Fax:  [phone] Name: Rebekah RamseurMRN: [id number] of Birth: [birth year] PHYSICAL THERAPY DISCHARGE SUMMARY  Visits from Start of Care: 14  Current functional level related to goals / functional outcomes: See above   Remaining deficits: See above   Education / Equipment: HEP  Plan: Patient agrees to discharge.  Patient goals were met. Patient is being discharged due to meeting the stated rehab goals.  ?????     JGoogle PT 09/12/18 8:55 AM

## 2018-09-17 ENCOUNTER — Telehealth: Payer: Self-pay

## 2018-09-17 NOTE — Telephone Encounter (Signed)
Spoke to Ms. Rebekah Hamilton about the 12-week, twice weekly PREP at [facility].  She is interested in an evening class at [facility].  We will be in contact in late Feb/early March when new classes start.

## 2018-10-03 ENCOUNTER — Telehealth (INDEPENDENT_AMBULATORY_CARE_PROVIDER_SITE_OTHER): Payer: Self-pay | Admitting: Orthopaedic Surgery

## 2018-10-03 NOTE — Telephone Encounter (Signed)
Sent!

## 2018-10-03 NOTE — Telephone Encounter (Signed)
Rebekah Hamilton from Outpatient Physical Therapy at [facility] called requesting Rebekah Hamilton or Dr. Durward Hamilton sign off of Plan of Care in Epic for date of service 08/03/18.    If you have any questions, please call back at [phone]

## 2018-10-03 NOTE — Telephone Encounter (Signed)
See below . Thanks

## 2018-10-09 ENCOUNTER — Ambulatory Visit: Payer: Self-pay | Admitting: Internal Medicine

## 2018-11-01 ENCOUNTER — Other Ambulatory Visit: Payer: Self-pay | Admitting: Adult Health

## 2018-11-01 NOTE — Progress Notes (Signed)
MEDICARE ANNUAL WELLNESS VISIT AND FOLLOW UP  Assessment:   Diagnoses and all orders for this visit:  Encounter for Medicare annual wellness exam Patient will ask GYN about ordering follow up DEXA scan  Essential hypertension At goal; continue medication Monitor blood pressure at home; call if consistently over 130/80 Continue DASH diet.   Reminder to go to the ER if any CP, SOB, nausea, dizziness, severe HA, changes vision/speech, left arm numbness and tingling and jaw pain.  Migraine without aura and without status migrainosus, not intractable Improved from previous - has mild HA once a week but does not progress to full migraines; continue medications Follow up neuro - [facility]  Mixed hyperlipidemia At goal; continue medications Continue low cholesterol diet and exercise.  Check lipid panel.  -     Lipid panel -     TSH  Other abnormal glucose Last A1Cs at goal  Discussed disease and risks Discussed diet/exercise, weight management  Defer A1C; check CMP  Allergic state, sequela Continue allergy medications as needed; rotate agent q 6 months, hygiene discussed  Depression, major, recurrent, in remission ([place]) with anxiety Improved with celexa to 60 mg; xanax PRN; having some recurrence, doesn't tolerate other meds well, she will follow up with counseling which has been very helpful in the past Restart on low dose trazodone (50 mg only due to risk of SS) and evaluate benefit Lifestyle discussed: diet/exerise, sleep hygiene, stress management, hydration  Spinal stenosis of lumbar region Continue follow up with ortho as recommended Will follow up for steroid injections  Medication management -     CBC with Differential/Platelet -     CMP/GFR  Vitamin D deficiency At goal at recent check; continue to recommend supplementation for goal of 70-100 Defer vitamin D level  Glaucoma, unspecified glaucoma type, unspecified laterality Very mild - currently under  observation Continue follow up with ophthalmology  CKD (chronic kidney disease), symptom management only, stage 2 (mild) Increase fluids, avoid NSAIDS, monitor sugars, will monitor -     CMP WITH GFR  OAB Improved with medications   R cerumen impaction - stop using Qtips, irrigation used in the office without complications, use OTC drops/oil at home to prevent reoccurence   Over 40 minutes of exam, counseling, chart review and critical decision making was performed Future Appointments  Date Time Provider [place]  04/09/2019 11:00 AM Unk Pinto, MD [facility] None     Plan:   During the course of the visit the patient was educated and counseled about appropriate screening and preventive services including:    Pneumococcal vaccine   Prevnar 13  Influenza vaccine  Td vaccine  Screening electrocardiogram  Bone densitometry screening  Colorectal cancer screening  Diabetes screening  Glaucoma screening  Nutrition counseling   Advanced directives: requested   Subjective:  Rebekah Hamilton is a 73 y.o. female who presents for Medicare Annual Wellness Visit and 3 month follow up.   She reports she is depressed r/t divorce dragging out with husband for past 2 years, family member she has been living with has transitioned to hospice.  she has a diagnosis of major depression with anxiety and is currently on cymbalta 60 mg daily. she is also prescribed xanax PRN, uses 1/2 tab of 0.25 mg PRN rarely (makes her very drowsy). She was followed by Dr. Dorian Furnace for counseling but hasn't followed up recently.   Followed by [facility] annually for estrogen/progesterone supplementation. She is on oxybutynin for OAB and reports  improved symptoms.   She is followed by Dr. Durward Fortes for lumbar pain/sciatica, recently much improved with PT and local injections.   BMI is Body mass index is 26.61 kg/m., she has not been working on diet and  exercise. Wt Readings from Last 3 Encounters:  11/05/18 152 lb 9.6 oz (69.2 kg)  08/03/18 154 lb (69.9 kg)  08/01/18 150 lb (68 kg)    Her blood pressure has been controlled at home, today their BP is BP: 122/74 She does not workout. She denies chest pain, shortness of breath, dizziness.   She is on cholesterol medication (pravastatin 40 mg daily) and denies myalgias. Her cholesterol is at goal. The cholesterol last visit was:   Lab Results  Component Value Date   CHOL 141 08/03/2018   HDL 56 08/03/2018   LDLCALC 67 08/03/2018   TRIG 92 08/03/2018   CHOLHDL 2.5 08/03/2018    She has been working on diet and exercise for glucose management, and denies increased appetite, nausea, paresthesia of the feet, polydipsia, polyuria, visual disturbances and vomiting. Last A1C in the office was:  Lab Results  Component Value Date   HGBA1C 5.6 04/02/2018   Last GFR demonstrates stage 2 CKD: Lab Results  Component Value Date   [facility] 64 08/03/2018   Patient is on Vitamin D supplement and at goal of 70:    Lab Results  Component Value Date   VD25OH 75 12/27/2017      Medication Review: Current Outpatient Medications on File Prior to Visit  Medication Sig Dispense Refill  . ALPRAZolam (XANAX) 0.25 MG tablet Take 1/2 to 1 tablet 2 to 3 x / day ONLY if needed for Acute Anxiety Attacks  & please try to limit to 5 days / week to avoid addiction 90 tablet 0  . Ascorbic Acid (VITAMIN C PO) 500 mg 2 (two) times daily.    Marland Kitchen aspirin 325 MG tablet Take 325 mg by mouth daily.    . Cholecalciferol (VITAMIN D PO) Take by mouth. Takes 10000 to 12000 units daily.    . clotrimazole-betamethasone (LOTRISONE) cream Apply 1 application topically 2 (two) times daily. (Patient taking differently: Apply 1 application topically as needed. ) 15 g 2  . DULoxetine (CYMBALTA) 60 MG capsule TAKE ONE CAPSULE BY MOUTH DAILY 90 capsule 1  . estradiol (ESTRACE) 0.5 MG tablet TAKE 1 TABLET BY MOUTH DAILY 90 tablet 3   . fexofenadine (ALLEGRA) 180 MG tablet Take 180 mg by mouth daily.    Marland Kitchen oxybutynin (DITROPAN-XL) 5 MG 24 hr tablet TAKE 1 TABLET BY MOUTH EVERY NIGHT AT BEDTIME FOR OVERACTIVE BLADDER 90 tablet 1  . pravastatin (PRAVACHOL) 40 MG tablet TAKE 1 TABLET BY MOUTH AT BEDTIME FOR CHOLESTEROL 90 tablet 1  . progesterone (PROMETRIUM) 100 MG capsule TAKE ONE CAPSULE BY MOUTH DAILY 90 capsule 3  . rizatriptan (MAXALT) 10 MG tablet TAKE 1 TABLET BY MOUTH AS NEEDED FOR MIGRAINE, MAY REPEAT DOSE IN 2 HOURS IF NEEDED 12 tablet 3   No current facility-administered medications on file prior to visit.     Allergies  Allergen Reactions  . Augmentin [Amoxicillin-Pot Clavulanate] Diarrhea  . Doxycycline Nausea Only  . Egg White (Diagnostic) Nausea Only  . Prozac [Fluoxetine Hcl] Other (See Comments)    Disoriented  . Wellbutrin [Bupropion] Other (See Comments)    Insomnia/Nervousness    Current Problems (verified) Patient Active Problem List   Diagnosis Date Noted  . Chronic left-sided low back pain with left-sided sciatica 07/02/2018  .  OAB (overactive bladder) 04/02/2018  . CKD (chronic kidney disease), symptom management only, stage 2 (mild) 09/24/2017  . Encounter for Medicare annual wellness exam 06/02/2015  . Glaucoma 06/02/2015  . Vitamin D deficiency 10/21/2014  . Medication management 12/01/2013  . Hyperlipidemia   . Hypertension   . Other abnormal glucose   . Migraines   . Allergy   . Anxiety   . Depression, major, recurrent, in remission ([place])   . Lumbar stenosis     Screening Tests Immunization History  Administered Date(s) Administered  . Influenza, High Dose Seasonal PF 05/29/2014, 06/02/2015, 06/16/2017, 05/09/2018  . Influenza,inj,quad, With Preservative 08/12/2013, 07/04/2016  . Pneumococcal Conjugate-13 04/07/2015  . Pneumococcal Polysaccharide-23 05/29/2013  . Td 08/15/2005, 07/04/2016   Prior vaccinations: TD or Tdap: 2017  Influenza: 2019 Pneumococcal:  2014 Prevnar13: 2016 Shingles/Zostavax: 2016  Preventative care: Last colonoscopy: 2006 Cologuard: 11/2017 Last mammogram: 05/2018, via OBGYN Last pap smear/pelvic exam: 2017 negative  [place] AB 2008 MRI lumbar 01/2017 CXR 2008 DEXA: 2000, declines  Names of Other Physician/Practitioners you currently use: 1. [facility] here for primary care 2. Dr. Herbert Deaner, eye doctor, last visit 2019  3. Dr. Evelene Croon, dentist, last visit 2020   Patient Care Team: Unk Pinto, MD as PCP - General (Internal Medicine) Garald Balding, MD as Consulting Physician (Orthopedic Surgery) Laurence Spates, MD as Consulting Physician (Gastroenterology) Avon Gully, NP as Nurse Practitioner (Obstetrics and Gynecology)  SURGICAL HISTORY She  has a past surgical history that includes Cholecystectomy (1999) and Breast lumpectomy (Bilateral). FAMILY HISTORY Her family history includes Heart attack in her paternal grandmother; Heart attack (age of onset: 54) in her father; Hypertension in her mother; Other in her maternal grandmother; Stroke in her mother. SOCIAL HISTORY She  reports that she has never smoked. She has never used smokeless tobacco. She reports current alcohol use of about 2.0 standard drinks of alcohol per week. She reports that she does not use drugs.   MEDICARE WELLNESS OBJECTIVES: Physical activity: Current Exercise Habits: Home exercise routine, Type of exercise: stretching, Time (Minutes): 10, Frequency (Times/Week): 2, Weekly Exercise (Minutes/Week): 20, Intensity: Mild, Exercise limited by: orthopedic condition(s)(sciatica) Cardiac risk factors: Cardiac Risk Factors include: advanced age (>86men, >57 women);dyslipidemia;hypertension;sedentary lifestyle Depression/mood screen:   Depression screen [facility] 2/9 11/05/2018  Decreased Interest 2  Down, Depressed, Hopeless 2  PHQ - 2 Score 4  Altered sleeping 1  Tired, decreased energy 1  Change in  appetite 0  Feeling bad or failure about yourself  0  Trouble concentrating 0  Moving slowly or fidgety/restless 0  Suicidal thoughts 0  PHQ-9 Score 6  Difficult doing work/chores Somewhat difficult    ADLs:  In your present state of health, do you have any difficulty performing the following activities: 11/05/2018 06/03/2018  Hearing? N N  Vision? N N  Difficulty concentrating or making decisions? N N  Walking or climbing stairs? N N  Dressing or bathing? N N  Doing errands, shopping? N N  Some recent data might be hidden     Cognitive Testing  Alert? Yes  Normal Appearance?Yes  Oriented to person? Yes  Place? Yes   Time? Yes  Recall of three objects?  Yes  Can perform simple calculations? Yes  Displays appropriate judgment?Yes  Can read the correct time from a watch face?Yes  EOL planning: Does Patient Have a Medical Advance Directive?: No Would patient like information on creating a medical advance directive?: Yes (MAU/Ambulatory/Procedural Areas - Information given)  Review of Systems  Constitutional: Negative for malaise/fatigue and weight loss.  HENT: Negative for hearing loss and tinnitus.   Eyes: Negative for blurred vision and double vision.  Respiratory: Negative for cough, shortness of breath and wheezing.   Cardiovascular: Negative for chest pain, palpitations, orthopnea, claudication and leg swelling.  Gastrointestinal: Negative for abdominal pain, blood in stool, constipation, diarrhea, heartburn, melena, nausea and vomiting.  Genitourinary: Negative.   Musculoskeletal: Negative for falls, joint pain and myalgias.  Skin: Negative for rash.  Neurological: Negative for dizziness, tingling, sensory change, weakness and headaches.  Endo/Heme/Allergies: Negative for polydipsia.  Psychiatric/Behavioral: Positive for depression. Negative for memory loss, substance abuse and suicidal ideas. The patient is not nervous/anxious and does not have insomnia.   All other  systems reviewed and are negative.    Objective:     Today's Vitals   11/05/18 1357  BP: 122/74  Pulse: 69  Temp: (!) 97.3 F (36.3 C)  SpO2: 98%  Weight: 152 lb 9.6 oz (69.2 kg)  Height: 5' 3.5" (1.613 m)   Body mass index is 26.61 kg/m.  General appearance: alert, no distress, WD/WN, female HEENT: normocephalic, sclerae anicteric, L TM pearly, R obstructed by soft wax, nares patent, no discharge or erythema, pharynx normal Oral cavity: MMM, no lesions Neck: supple, no lymphadenopathy, no thyromegaly, no masses Heart: RRR, normal S1, S2, no murmurs Lungs: CTA bilaterally, no wheezes, rhonchi, or rales Abdomen: +bs, soft, non tender, non distended, no masses, no hepatomegaly, no splenomegaly Musculoskeletal: nontender, no swelling, no obvious deformity Extremities: no edema, no cyanosis, no clubbing Pulses: 2+ symmetric, upper and lower extremities, normal cap refill Neurological: alert, oriented x 3, CN2-12 intact, strength normal upper extremities and lower extremities, sensation normal throughout, DTRs 2+ throughout, no cerebellar signs, gait normal Psychiatric: normal affect, behavior normal, pleasant   Medicare Attestation I have personally reviewed: The patient's medical and social history Their use of alcohol, tobacco or illicit drugs Their current medications and supplements The patient's functional ability including ADLs,fall risks, home safety risks, cognitive, and hearing and visual impairment Diet and physical activities Evidence for depression or mood disorders  The patient's weight, height, BMI, and visual acuity have been recorded in the chart.  I have made referrals, counseling, and provided education to the patient based on review of the above and I have provided the patient with a written personalized care plan for preventive services.     Izora Ribas, NP   11/05/2018

## 2018-11-01 NOTE — Patient Instructions (Addendum)
Rebekah Hamilton , Thank you for taking time to come for your Medicare Wellness Visit. I appreciate your ongoing commitment to your health goals. Please review the following plan we discussed and let me know if I can assist you in the future.   These are the goals we discussed: Goals    . Exercise 150 min/wk Moderate Activity       This is a list of the screening recommended for you and due dates:  Health Maintenance  Topic Date Due  . DEXA scan (bone density measurement)  02/13/2012  . Mammogram  06/06/2020  . Cologuard (Stool DNA test)  10/26/2020  . Tetanus Vaccine  07/04/2026  . Flu Shot  Completed  .  Hepatitis C: One time screening is recommended by Center for Disease Control  (CDC) for  adults born from [birth year] through 1964.   Completed  . Pneumonia vaccines  Completed    Try trazodone 50 mg at night prior to bedtime - call or message in a few weeks and let me know if this is helpful   Sciatica Rehab Ask your health care provider which exercises are safe for you. Do exercises exactly as told by your health care provider and adjust them as directed. It is normal to feel mild stretching, pulling, tightness, or discomfort as you do these exercises, but you should stop right away if you feel sudden pain or your pain gets worse.Do not begin these exercises until told by your health care provider. Stretching and range of motion exercises These exercises warm up your muscles and joints and improve the movement and flexibility of your hips and your back. These exercises also help to relieve pain, numbness, and tingling. Exercise A: Sciatic nerve glide 1. Sit in a chair with your head facing down toward your chest. Place your hands behind your back. Let your shoulders slump forward. 2. Slowly straighten one of your knees while you tilt your head back as if you are looking toward the ceiling. Only straighten your leg as far as you can without making your symptoms worse. 3. Hold for  __________ seconds. 4. Slowly return to the starting position. 5. Repeat with your other leg. Repeat __________ times. Complete this exercise __________ times a day. Exercise B: Knee to chest with hip adduction and internal rotation  1. Lie on your back on a firm surface with both legs straight. 2. Bend one of your knees and move it up toward your chest until you feel a gentle stretch in your lower back and buttock. Then, move your knee toward the shoulder that is on the opposite side from your leg. ? Hold your leg in this position by holding onto the front of your knee. 3. Hold for __________ seconds. 4. Slowly return to the starting position. 5. Repeat with your other leg. Repeat __________ times. Complete this exercise __________ times a day. Exercise C: Prone extension on elbows  1. Lie on your abdomen on a firm surface. A bed may be too soft for this exercise. 2. Prop yourself up on your elbows. 3. Use your arms to help lift your chest up until you feel a gentle stretch in your abdomen and your lower back. ? This will place some of your body weight on your elbows. If this is uncomfortable, try stacking pillows under your chest. ? Your hips should stay down, against the surface that you are lying on. Keep your hip and back muscles relaxed. 4. Hold for __________ seconds. 5.  Slowly relax your upper body and return to the starting position. Repeat __________ times. Complete this exercise __________ times a day. Strengthening exercises These exercises build strength and endurance in your back. Endurance is the ability to use your muscles for a long time, even after they get tired. Exercise D: Pelvic tilt 1. Lie on your back on a firm surface. Bend your knees and keep your feet flat. 2. Tense your abdominal muscles. Tip your pelvis up toward the ceiling and flatten your lower back into the floor. ? To help with this exercise, you may place a small towel under your lower back and try to  push your back into the towel. 3. Hold for __________ seconds. 4. Let your muscles relax completely before you repeat this exercise. Repeat __________ times. Complete this exercise __________ times a day. Exercise E: Alternating arm and leg raises  1. Get on your hands and knees on a firm surface. If you are on a hard floor, you may want to use padding to cushion your knees, such as an exercise mat. 2. Line up your arms and legs. Your hands should be below your shoulders, and your knees should be below your hips. 3. Lift your left leg behind you. At the same time, raise your right arm and straighten it in front of you. ? Do not lift your leg higher than your hip. ? Do not lift your arm higher than your shoulder. ? Keep your abdominal and back muscles tight. ? Keep your hips facing the ground. ? Do not arch your back. ? Keep your balance carefully, and do not hold your breath. 4. Hold for __________ seconds. 5. Slowly return to the starting position and repeat with your right leg and your left arm. Repeat __________ times. Complete this exercise __________ times a day. Posture and body mechanics  Body mechanics refers to the movements and positions of your body while you do your daily activities. Posture is part of body mechanics. Good posture and healthy body mechanics can help to relieve stress in your body's tissues and joints. Good posture means that your spine is in its natural S-curve position (your spine is neutral), your shoulders are pulled back slightly, and your head is not tipped forward. The following are general guidelines for applying improved posture and body mechanics to your everyday activities. Standing   When standing, keep your spine neutral and your feet about hip-width apart. Keep a slight bend in your knees. Your ears, shoulders, and hips should line up.  When you do a task in which you stand in one place for a long time, place one foot up on a stable object that is  2-4 inches (5-10 cm) high, such as a footstool. This helps keep your spine neutral. Sitting   When sitting, keep your spine neutral and keep your feet flat on the floor. Use a footrest, if necessary, and keep your thighs parallel to the floor. Avoid rounding your shoulders, and avoid tilting your head forward.  When working at a desk or a computer, keep your desk at a height where your hands are slightly lower than your elbows. Slide your chair under your desk so you are close enough to maintain good posture.  When working at a computer, place your monitor at a height where you are looking straight ahead and you do not have to tilt your head forward or downward to look at the screen. Resting   When lying down and resting, avoid positions that are  most painful for you.  If you have pain with activities such as sitting, bending, stooping, or squatting (flexion-based activities), lie in a position in which your body does not bend very much. For example, avoid curling up on your side with your arms and knees near your chest (fetal position).  If you have pain with activities such as standing for a long time or reaching with your arms (extension-based activities), lie with your spine in a neutral position and bend your knees slightly. Try the following positions: ? Lying on your side with a pillow between your knees. ? Lying on your back with a pillow under your knees. Lifting   When lifting objects, keep your feet at least shoulder-width apart and tighten your abdominal muscles.  Bend your knees and hips and keep your spine neutral. It is important to lift using the strength of your legs, not your back. Do not lock your knees straight out.  Always ask for help to lift heavy or awkward objects. This information is not intended to replace advice given to you by your health care provider. Make sure you discuss any questions you have with your health care provider. Document Released: 08/01/2005  Document Revised: 04/07/2016 Document Reviewed: 04/17/2015 Elsevier Interactive Patient Education  2019 [employer].

## 2018-11-02 ENCOUNTER — Other Ambulatory Visit: Payer: Self-pay | Admitting: Internal Medicine

## 2018-11-05 ENCOUNTER — Other Ambulatory Visit: Payer: Self-pay

## 2018-11-05 ENCOUNTER — Encounter: Payer: Self-pay | Admitting: Adult Health

## 2018-11-05 ENCOUNTER — Ambulatory Visit (INDEPENDENT_AMBULATORY_CARE_PROVIDER_SITE_OTHER): Payer: Medicare Other | Admitting: Adult Health

## 2018-11-05 VITALS — BP 122/74 | HR 69 | Temp 97.3°F | Ht 63.5 in | Wt 152.6 lb

## 2018-11-05 DIAGNOSIS — F419 Anxiety disorder, unspecified: Secondary | ICD-10-CM

## 2018-11-05 DIAGNOSIS — H409 Unspecified glaucoma: Secondary | ICD-10-CM

## 2018-11-05 DIAGNOSIS — M5442 Lumbago with sciatica, left side: Secondary | ICD-10-CM

## 2018-11-05 DIAGNOSIS — Z79899 Other long term (current) drug therapy: Secondary | ICD-10-CM | POA: Diagnosis not present

## 2018-11-05 DIAGNOSIS — R7309 Other abnormal glucose: Secondary | ICD-10-CM | POA: Diagnosis not present

## 2018-11-05 DIAGNOSIS — I10 Essential (primary) hypertension: Secondary | ICD-10-CM | POA: Diagnosis not present

## 2018-11-05 DIAGNOSIS — E782 Mixed hyperlipidemia: Secondary | ICD-10-CM | POA: Diagnosis not present

## 2018-11-05 DIAGNOSIS — N182 Chronic kidney disease, stage 2 (mild): Secondary | ICD-10-CM | POA: Diagnosis not present

## 2018-11-05 DIAGNOSIS — G43009 Migraine without aura, not intractable, without status migrainosus: Secondary | ICD-10-CM

## 2018-11-05 DIAGNOSIS — R6889 Other general symptoms and signs: Secondary | ICD-10-CM | POA: Diagnosis not present

## 2018-11-05 DIAGNOSIS — Z0001 Encounter for general adult medical examination with abnormal findings: Secondary | ICD-10-CM

## 2018-11-05 DIAGNOSIS — E559 Vitamin D deficiency, unspecified: Secondary | ICD-10-CM | POA: Diagnosis not present

## 2018-11-05 DIAGNOSIS — H6121 Impacted cerumen, right ear: Secondary | ICD-10-CM | POA: Diagnosis not present

## 2018-11-05 DIAGNOSIS — F3340 Major depressive disorder, recurrent, in remission, unspecified: Secondary | ICD-10-CM

## 2018-11-05 DIAGNOSIS — G8929 Other chronic pain: Secondary | ICD-10-CM

## 2018-11-05 DIAGNOSIS — T7840XS Allergy, unspecified, sequela: Secondary | ICD-10-CM

## 2018-11-05 DIAGNOSIS — N3281 Overactive bladder: Secondary | ICD-10-CM | POA: Diagnosis not present

## 2018-11-05 DIAGNOSIS — Z0000 Encounter for general adult medical examination without abnormal findings: Secondary | ICD-10-CM

## 2018-11-05 DIAGNOSIS — M48061 Spinal stenosis, lumbar region without neurogenic claudication: Secondary | ICD-10-CM

## 2018-11-05 MED ORDER — TRAZODONE HCL 150 MG PO TABS: ORAL_TABLET | ORAL | 0 refills | Status: DC

## 2018-11-06 LAB — COMPLETE METABOLIC PANEL WITH GFR
AG RATIO: 1.8 (calc) (ref 1.0–2.5)
ALBUMIN MSPROF: 4.5 g/dL (ref 3.6–5.1)
ALT: 18 U/L (ref 6–29)
AST: 23 U/L (ref 10–35)
Alkaline phosphatase (APISO): 57 U/L (ref 37–153)
BUN/Creatinine Ratio: 19 (calc) (ref 6–22)
BUN: 19 mg/dL (ref 7–25)
CALCIUM: 9.8 mg/dL (ref 8.6–10.4)
CO2: 32 mmol/L (ref 20–32)
CREATININE: 0.98 mg/dL — AB (ref 0.60–0.93)
Chloride: 99 mmol/L (ref 98–110)
GFR, Est African American: 67 mL/min/{1.73_m2} (ref >=60)
GFR, Est Non African American: 58 mL/min/{1.73_m2} — ABNORMAL LOW (ref >=60)
Globulin: 2.5 g/dL (calc) (ref 1.9–3.7)
Glucose, Bld: 108 mg/dL — ABNORMAL HIGH (ref 65–99)
Potassium: 3.6 mmol/L (ref 3.5–5.3)
Sodium: 142 mmol/L (ref 135–146)
Total Bilirubin: 0.4 mg/dL (ref 0.2–1.2)
Total Protein: 7 g/dL (ref 6.1–8.1)

## 2018-11-06 LAB — CBC WITH DIFFERENTIAL/PLATELET
Absolute Monocytes: 861 cells/uL (ref 200–950)
Basophils Absolute: 65 cells/uL (ref 0–200)
Basophils Relative: 0.6 %
Eosinophils Absolute: 283 cells/uL (ref 15–500)
Eosinophils Relative: 2.6 %
HCT: 43.6 % (ref 35.0–45.0)
Hemoglobin: 14.7 g/dL (ref 11.7–15.5)
Lymphs Abs: 3292 cells/uL (ref 850–3900)
MCH: 30.2 pg (ref 27.0–33.0)
MCHC: 33.7 g/dL (ref 32.0–36.0)
MCV: 89.5 fL (ref 80.0–100.0)
MPV: 10.8 fL (ref 7.5–12.5)
Monocytes Relative: 7.9 %
Neutro Abs: 6398 cells/uL (ref 1500–7800)
Neutrophils Relative %: 58.7 %
Platelets: 390 10*3/uL (ref 140–400)
RBC: 4.87 10*6/uL (ref 3.80–5.10)
RDW: 12.1 % (ref 11.0–15.0)
Total Lymphocyte: 30.2 %
WBC: 10.9 10*3/uL — ABNORMAL HIGH (ref 3.8–10.8)

## 2018-11-06 LAB — LIPID PANEL
Cholesterol: 152 mg/dL (ref <200)
HDL: 54 mg/dL (ref >=50)
LDL Cholesterol (Calc): 76 mg/dL (calc)
Non-HDL Cholesterol (Calc): 98 mg/dL (calc) (ref <130)
TRIGLYCERIDES: 138 mg/dL (ref <150)
Total CHOL/HDL Ratio: 2.8 (calc) (ref <5.0)

## 2018-11-06 LAB — HEMOGLOBIN A1C
Hgb A1c MFr Bld: 5.8 % of total Hgb — ABNORMAL HIGH (ref <5.7)
Mean Plasma Glucose: 120 (calc)
eAG (mmol/L): 6.6 (calc)

## 2018-11-06 LAB — TSH: TSH: 2.92 mIU/L (ref 0.40–4.50)

## 2019-01-11 DIAGNOSIS — H04123 Dry eye syndrome of bilateral lacrimal glands: Secondary | ICD-10-CM | POA: Diagnosis not present

## 2019-01-11 DIAGNOSIS — H25013 Cortical age-related cataract, bilateral: Secondary | ICD-10-CM | POA: Diagnosis not present

## 2019-01-11 DIAGNOSIS — H35013 Changes in retinal vascular appearance, bilateral: Secondary | ICD-10-CM | POA: Diagnosis not present

## 2019-01-11 DIAGNOSIS — H2513 Age-related nuclear cataract, bilateral: Secondary | ICD-10-CM | POA: Diagnosis not present

## 2019-01-29 ENCOUNTER — Other Ambulatory Visit: Payer: Self-pay

## 2019-01-29 ENCOUNTER — Encounter: Payer: Self-pay | Admitting: Orthopaedic Surgery

## 2019-01-29 ENCOUNTER — Ambulatory Visit (INDEPENDENT_AMBULATORY_CARE_PROVIDER_SITE_OTHER): Payer: Medicare Other | Admitting: Orthopaedic Surgery

## 2019-01-29 VITALS — BP 131/70 | HR 85 | Resp 16 | Ht 63.0 in | Wt 154.6 lb

## 2019-01-29 DIAGNOSIS — M5442 Lumbago with sciatica, left side: Secondary | ICD-10-CM

## 2019-01-29 DIAGNOSIS — M48062 Spinal stenosis, lumbar region with neurogenic claudication: Secondary | ICD-10-CM | POA: Diagnosis not present

## 2019-01-29 DIAGNOSIS — G8929 Other chronic pain: Secondary | ICD-10-CM

## 2019-01-29 NOTE — Progress Notes (Signed)
Office Visit Note   Patient: Rebekah Hamilton           Date of Birth: 04-Jun-1946           MRN: [id number] Visit Date: 01/29/2019              Requested by: Unk Pinto, [address] [place],  [place] [address] PCP: Unk Pinto, MD   Assessment & Plan: Visit Diagnoses:  1. Spinal stenosis of lumbar region with neurogenic claudication   2. Chronic left-sided low back pain with left-sided sciatica     Plan:  #1: Obtain appoint with Dr. Louanne Skye for evaluation.  We are concerned of the fact that she is losing some rectal function. #2: She has been using a fair amount of ibuprofen which may be the reason for her diarrhea. #3: Discussed with her the use of Voltaren gel on her lower lumbar spine which may be of benefit for her pain.  We will obtain a tube of this.  Needs to stop the ibuprofen.  Follow-Up Instructions: We will make an appointment for her to see Dr. Louanne Skye for his evaluation on an urgent basis..  We are concerned the fact that she is having some loss of stool.  Also the fact that she is not improving and regressing.  Face-to-face time spent with patient was greater than 30 minutes.  Greater than 50% of the time was spent in counseling and coordination of care.  Orders:  No orders of the defined types were placed in this encounter.  No orders of the defined types were placed in this encounter.     Procedures: No procedures performed   Clinical Data: No additional findings.   Subjective: Chief Complaint  Patient presents with  . Lower Back - Pain   HPI Patient presents today for lower back pain. She was here last on 08/01/18. She said that she continues to have the same back pain. Her pain radiates down her left leg. She said that she has some tingling in her distal left leg. Her left leg feels weaker than the right. She takes Ibuprofen as needed for pain. No previous back surgery. She has had epidurals in the past with a  benefit.  These were done at [facility].  She is also noted recent diarrhea and also the loss of stool at times.  Denies any perianal numbness.  Previous MRI from March 2019 revealed:  Moderate to moderately severe central canal stenosis at L2-3 and is worse toward the left and in the left lateral recess.  Moderately severe left foraminal narrowing and moderately severe to severe central canal and bilateral lateral recess stenosis at L3-4.  Moderate to moderately severe central canal stenosis and narrowing in the right lateral recess at L4-5.  Right worse than left subarticular recess narrowing at L5-S1. Severe right foraminal narrowing is also present at this level.  Severe convex right thoracolumbar scoliosis.  Her symptoms are worsening now.  She has returned to work for financial reasons and this is adding to her symptoms.  Review of Systems  Constitutional: Negative for fatigue.  HENT: Negative for ear pain.   Eyes: Negative for pain.  Respiratory: Negative for shortness of breath.   Cardiovascular: Positive for leg swelling.  Gastrointestinal: Positive for diarrhea. Negative for constipation.  Endocrine: Negative for cold intolerance and heat intolerance.  Genitourinary: Negative for difficulty urinating.  Musculoskeletal: Negative for joint swelling.  Skin: Negative for rash.  Allergic/Immunologic: Negative for food  allergies.  Neurological: Positive for weakness.  Hematological: Does not bruise/bleed easily.  Psychiatric/Behavioral: Negative for sleep disturbance.     Objective: Vital Signs: BP 131/70 (BP Location: Left Arm, Patient Position: Sitting, Cuff Size: Normal)   Pulse 85   Resp 16   Ht 5\' 3"  (1.6 m)   Wt 154 lb 9.6 oz (70.1 kg)   BMI 27.39 kg/m   Physical Exam Constitutional:      Appearance: She is well-developed.  Eyes:     Pupils: Pupils are equal, round, and reactive to light.  Pulmonary:     Effort: Pulmonary effort is normal.   Skin:    General: Skin is warm and dry.  Neurological:     Mental Status: She is alert and oriented to person, place, and time.  Psychiatric:        Behavior: Behavior normal.     Ortho Exam  She has decreased range of motion of the lumbar spine.  Negative straight leg raising bilaterally.  She states her sensation is intact bilaterally and equally to light touch.  Her strength is good bilateral and equal too.  Deep tendon reflexes were absent in the left knee and ankle.  The right knee did have a trace to 1+.  Did not obtain one in the right Achilles.   Specialty Comments:  No specialty comments available.  Imaging: No results found.   PMFS History: Current Outpatient Medications  Medication Sig Dispense Refill  . ALPRAZolam (XANAX) 0.25 MG tablet Take 1/2 to 1 tablet 2 to 3 x / day ONLY if needed for Acute Anxiety Attacks  & please try to limit to 5 days / week to avoid addiction 90 tablet 0  . Ascorbic Acid (VITAMIN C PO) 500 mg 2 (two) times daily.    Marland Kitchen aspirin 325 MG tablet Take 325 mg by mouth daily.    . Cholecalciferol (VITAMIN D PO) Take by mouth. Takes 10000 to 12000 units daily.    . clotrimazole-betamethasone (LOTRISONE) cream Apply 1 application topically 2 (two) times daily. (Patient taking differently: Apply 1 application topically as needed. ) 15 g 2  . DULoxetine (CYMBALTA) 60 MG capsule TAKE ONE CAPSULE BY MOUTH DAILY 90 capsule 1  . estradiol (ESTRACE) 0.5 MG tablet TAKE 1 TABLET BY MOUTH DAILY 90 tablet 3  . fexofenadine (ALLEGRA) 180 MG tablet Take 180 mg by mouth daily.    Marland Kitchen oxybutynin (DITROPAN-XL) 5 MG 24 hr tablet TAKE 1 TABLET BY MOUTH EVERY NIGHT AT BEDTIME FOR OVERACTIVE BLADDER (Patient taking differently: as needed. ) 90 tablet 1  . pravastatin (PRAVACHOL) 40 MG tablet TAKE 1 TABLET BY MOUTH AT BEDTIME FOR CHOLESTEROL 90 tablet 1  . progesterone (PROMETRIUM) 100 MG capsule TAKE ONE CAPSULE BY MOUTH DAILY 90 capsule 3  . rizatriptan (MAXALT) 10 MG  tablet TAKE 1 TABLET BY MOUTH AS NEEDED FOR MIGRAINE, MAY REPEAT DOSE IN 2 HOURS IF NEEDED 12 tablet 3  . traZODone (DESYREL) 150 MG tablet Take 1/3 tab 1 hour before bedtime if needed for sleep (Patient not taking: Reported on 01/29/2019) 30 tablet 0   No current facility-administered medications for this visit.     Patient Active Problem List   Diagnosis Date Noted  . Chronic left-sided low back pain with left-sided sciatica 07/02/2018  . OAB (overactive bladder) 04/02/2018  . CKD (chronic kidney disease), symptom management only, stage 2 (mild) 09/24/2017  . Encounter for Medicare annual wellness exam 06/02/2015  . Glaucoma 06/02/2015  . Vitamin D  deficiency 10/21/2014  . Medication management 12/01/2013  . Hyperlipidemia   . Hypertension   . Other abnormal glucose   . Migraines   . Allergy   . Anxiety   . Depression, major, recurrent, in remission ([place])   . Lumbar stenosis    Past Medical History:  Diagnosis Date  . Allergy   . Anemia   . Anxiety   . Depression   . Hyperlipidemia   . Hypertension   . Lumbar stenosis   . Migraines   . Prediabetes     Family History  Problem Relation Age of Onset  . Stroke Mother   . Hypertension Mother   . Heart attack Father 79  . Other Maternal Grandmother        blood clot after surgery  . Heart attack Paternal Grandmother     Past Surgical History:  Procedure Laterality Date  . BREAST LUMPECTOMY Bilateral   . CHOLECYSTECTOMY  1999   Social History   Occupational History  . Not on file  Tobacco Use  . Smoking status: Never Smoker  . Smokeless tobacco: Never Used  Substance and Sexual Activity  . Alcohol use: Yes    Alcohol/week: 2.0 standard drinks    Types: 2 Glasses of wine per week  . Drug use: No  . Sexual activity: Not Currently

## 2019-01-31 ENCOUNTER — Other Ambulatory Visit: Payer: Self-pay | Admitting: Internal Medicine

## 2019-02-03 DIAGNOSIS — Z20828 Contact with and (suspected) exposure to other viral communicable diseases: Secondary | ICD-10-CM | POA: Diagnosis not present

## 2019-02-05 ENCOUNTER — Ambulatory Visit: Payer: Medicare Other | Admitting: Adult Health

## 2019-02-08 ENCOUNTER — Other Ambulatory Visit: Payer: Self-pay | Admitting: Internal Medicine

## 2019-02-08 DIAGNOSIS — Z20828 Contact with and (suspected) exposure to other viral communicable diseases: Secondary | ICD-10-CM

## 2019-02-08 DIAGNOSIS — Z20822 Contact with and (suspected) exposure to covid-19: Secondary | ICD-10-CM

## 2019-02-12 ENCOUNTER — Other Ambulatory Visit: Payer: Self-pay

## 2019-02-12 ENCOUNTER — Other Ambulatory Visit: Payer: Medicare Other

## 2019-02-12 DIAGNOSIS — Z20822 Contact with and (suspected) exposure to covid-19: Secondary | ICD-10-CM

## 2019-02-12 DIAGNOSIS — Z20828 Contact with and (suspected) exposure to other viral communicable diseases: Secondary | ICD-10-CM

## 2019-02-13 LAB — SAR COV2 SEROLOGY (COVID19)AB(IGG),IA: SARS CoV2 AB IGG: POSITIVE — AB

## 2019-02-27 ENCOUNTER — Ambulatory Visit (INDEPENDENT_AMBULATORY_CARE_PROVIDER_SITE_OTHER): Payer: Medicare Other | Admitting: Specialist

## 2019-02-27 ENCOUNTER — Encounter: Payer: Self-pay | Admitting: Specialist

## 2019-02-27 ENCOUNTER — Other Ambulatory Visit: Payer: Self-pay

## 2019-02-27 ENCOUNTER — Ambulatory Visit: Payer: Self-pay

## 2019-02-27 VITALS — BP 161/93 | HR 63 | Ht 63.0 in | Wt 154.0 lb

## 2019-02-27 DIAGNOSIS — M48062 Spinal stenosis, lumbar region with neurogenic claudication: Secondary | ICD-10-CM | POA: Diagnosis not present

## 2019-02-27 DIAGNOSIS — M5416 Radiculopathy, lumbar region: Secondary | ICD-10-CM

## 2019-02-27 DIAGNOSIS — M4156 Other secondary scoliosis, lumbar region: Secondary | ICD-10-CM | POA: Diagnosis not present

## 2019-02-27 MED ORDER — CELECOXIB 200 MG PO CAPS: ORAL_CAPSULE | ORAL | 0 refills | Status: DC

## 2019-02-27 MED ORDER — GABAPENTIN 100 MG PO CAPS: 100.0000 mg | ORAL_CAPSULE | Freq: Every day | ORAL | 3 refills | Status: DC

## 2019-02-27 NOTE — Progress Notes (Signed)
Office Visit Note   Patient: Rebekah Hamilton           Date of Birth: Apr 24, 1946           MRN: [id number] Visit Date: 02/27/2019              Requested by: Unk Pinto, [address] [place],  [place] [address] PCP: Unk Pinto, MD   Assessment & Plan: Visit Diagnoses:  1. Spinal stenosis of lumbar region with neurogenic claudication   2. Other secondary scoliosis, lumbar region   3. Lumbar radiculopathy     Plan:Plan: Avoid bending, stooping and avoid lifting weights greater than 10 lbs. Avoid prolong standing and walking. Avoid frequent bending and stooping  No lifting greater than 10 lbs. May use ice or moist heat for pain. Weight loss is of benefit. Handicap license is approved. Will call [facility] ant their secretary/Assistant will call to arrange for epidural steroid injection left  transforamenal L3-4 for spinal stenosis Flexion exercises, stationary bicycle and pool walking exercises.  Follow-Up Instructions: No follow-ups on file.   Orders:  Orders Placed This Encounter  Procedures   XR Lumbar Spine 2-3 Views   No orders of the defined types were placed in this encounter.     Procedures: No procedures performed   Clinical Data: No additional findings.   Subjective: Chief Complaint  Patient presents with   Lower Back - Pain    73 year old female with history of back injury and pain that radiates down the left leg anterior left thigh and into the anterolateral leg below the knee. She has pain with standing and walking. 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 She had first husband pass away nearly 30 years ago after moving to [place]. She remarried and has been married about 23 years. The children daughter and son and they have moved. Her second marriage is nearly 23 years and she is separate. She has pain with standing and walking. She  has trouble with walking around the block and leans on cart at the grocery store and tends to stoop. Uses a pillow with driving and on the road to visit family in [place]. Sitting improves the pain. No bowel or bladder difficulty, is having some bowel urgency and has to get to the bathroom. Goes up and down stairs With one leg at a time. Going up stairs is worse the first step is worse. In the AM not to much stiffness.  Straightening post riding in the car. The leg pain is worsening and is the concerning problem. She has had ESIs last Year  2-3 a year by [facility] for her for pinching of the nerves. She has previous injection by Dr.Newton and she had opposite leg numbness and feelings of weakness and it lasted about 1/2 hours.   Review of Systems  Constitutional: Negative.  Negative for activity change, appetite change, chills, diaphoresis, fatigue, fever and unexpected weight change.  HENT: Positive for congestion. Negative for dental problem, drooling, ear discharge, ear pain, facial swelling, hearing loss, mouth sores, nosebleeds, postnasal drip, rhinorrhea, sinus pressure, sinus pain, sneezing, sore throat, tinnitus, trouble swallowing and voice change.   Eyes: Negative.  Negative for photophobia, pain, discharge, redness, itching and visual disturbance.  Respiratory: Negative.  Negative for apnea, cough, choking, chest tightness, shortness of breath, wheezing and stridor.   Cardiovascular: Positive for leg swelling. Negative for chest  pain and palpitations.  Gastrointestinal: Negative.  Negative for abdominal distention, abdominal pain, anal bleeding, blood in stool, constipation, diarrhea, nausea, rectal pain and vomiting.  Endocrine: Negative for cold intolerance, heat intolerance, polydipsia, polyphagia and polyuria.  Genitourinary: Positive for urgency. Negative for difficulty urinating, dyspareunia, dysuria, enuresis, flank pain, frequency and hematuria.  Musculoskeletal: Positive for back pain and  gait problem. Negative for arthralgias, joint swelling, myalgias, neck pain and neck stiffness.  Skin: Negative for color change, pallor, rash and wound.     Objective: Vital Signs: BP (!) 161/93 (BP Location: Left Arm, Patient Position: Sitting)    Pulse 63    Ht 5\' 3"  (1.6 m)    Wt 154 lb (69.9 kg)    BMI 27.28 kg/m   Physical Exam Constitutional:      Appearance: She is well-developed.  HENT:     Head: Normocephalic and atraumatic.  Eyes:     Pupils: Pupils are equal, round, and reactive to light.  Neck:     Musculoskeletal: Normal range of motion and neck supple.  Pulmonary:     Effort: Pulmonary effort is normal.     Breath sounds: Normal breath sounds.  Abdominal:     General: Bowel sounds are normal.     Palpations: Abdomen is soft.  Skin:    General: Skin is warm and dry.  Neurological:     Mental Status: She is alert and oriented to person, place, and time.  Psychiatric:        Behavior: Behavior normal.        Thought Content: Thought content normal.        Judgment: Judgment normal.     Back Exam   Tenderness  The patient is experiencing tenderness in the lumbar.  Range of Motion  Extension: abnormal  Flexion: abnormal  Lateral bend right: abnormal  Lateral bend left: abnormal  Rotation right: abnormal   Muscle Strength  Right Quadriceps:  5/5  Left Quadriceps:  5/5  Right Hamstrings:  5/5  Left Hamstrings:  5/5   Tests  Straight leg raise right: negative Straight leg raise left: negative  Reflexes  Patellar: 0/4 Achilles: 2/4 Babinski's sign: normal   Other  Toe walk: normal Heel walk: normal Sensation: normal Gait: normal  Erythema: no back redness Scars: absent  Comments:  Weak left foot DF 4/5, Left quad strength is normal  Left knee reflex is absent right is 2+ Ankles 2+ and symmetric      Specialty Comments:  No specialty comments available.  Imaging: No results found.   PMFS History: Patient Active Problem List    Diagnosis Date Noted   Chronic left-sided low back pain with left-sided sciatica 07/02/2018   OAB (overactive bladder) 04/02/2018   CKD (chronic kidney disease), symptom management only, stage 2 (mild) 09/24/2017   Encounter for Medicare annual wellness exam 06/02/2015   Glaucoma 06/02/2015   Vitamin D deficiency 10/21/2014   Medication management 12/01/2013   Hyperlipidemia    Hypertension    Other abnormal glucose    Migraines    Allergy    Anxiety    Depression, major, recurrent, in remission ([place])    Lumbar stenosis    Past Medical History:  Diagnosis Date   Allergy    Anemia    Anxiety    Depression    Hyperlipidemia    Hypertension    Lumbar stenosis    Migraines    Prediabetes     Family History  Problem Relation Age  of Onset   Stroke Mother    Hypertension Mother    Heart attack Father 29   Other Maternal Grandmother        blood clot after surgery   Heart attack Paternal Grandmother     Past Surgical History:  Procedure Laterality Date   BREAST LUMPECTOMY Bilateral    CHOLECYSTECTOMY  1999   Social History   Occupational History   Not on file  Tobacco Use   Smoking status: Never Smoker   Smokeless tobacco: Never Used  Substance and Sexual Activity   Alcohol use: Yes    Alcohol/week: 2.0 standard drinks    Types: 2 Glasses of wine per week   Drug use: No   Sexual activity: Not Currently

## 2019-02-27 NOTE — Patient Instructions (Addendum)
Plan: Avoid bending, stooping and avoid lifting weights greater than 10 lbs. Avoid prolong standing and walking. Avoid frequent bending and stooping  No lifting greater than 10 lbs. May use ice or moist heat for pain. Weight loss is of benefit. Handicap license is approved. Will call [facility] ant their secretary/Assistant will call to arrange for epidural steroid injection left  transforamenal L3-4 for spinal stenosis Flexion exercises, stationary bicycle and pool walking exercises.

## 2019-03-01 ENCOUNTER — Other Ambulatory Visit: Payer: Self-pay | Admitting: Specialist

## 2019-03-01 DIAGNOSIS — M48062 Spinal stenosis, lumbar region with neurogenic claudication: Secondary | ICD-10-CM

## 2019-03-06 ENCOUNTER — Other Ambulatory Visit: Payer: Self-pay | Admitting: Internal Medicine

## 2019-03-13 ENCOUNTER — Other Ambulatory Visit: Payer: Self-pay

## 2019-03-13 ENCOUNTER — Ambulatory Visit
Admission: RE | Admit: 2019-03-13 | Discharge: 2019-03-13 | Disposition: A | Discharge status: Home / Self Care | Payer: Medicare Other | Source: Ambulatory Visit | Attending: Specialist | Admitting: Specialist

## 2019-03-13 DIAGNOSIS — M48062 Spinal stenosis, lumbar region with neurogenic claudication: Secondary | ICD-10-CM

## 2019-03-13 DIAGNOSIS — M545 Low back pain: Secondary | ICD-10-CM | POA: Diagnosis not present

## 2019-03-13 MED ORDER — IOPAMIDOL (ISOVUE-M 200) INJECTION 41%
1.0000 mL | Freq: Once | INTRAMUSCULAR | Status: AC
  Administered 2019-03-13: 1 mL via EPIDURAL

## 2019-03-13 MED ORDER — METHYLPREDNISOLONE ACETATE 40 MG/ML INJ SUSP (RADIOLOG
120.0000 mg | Freq: Once | INTRAMUSCULAR | Status: AC
  Administered 2019-03-13: 120 mg via EPIDURAL

## 2019-03-13 NOTE — Discharge Instructions (Signed)

## 2019-03-27 ENCOUNTER — Ambulatory Visit: Payer: Medicare Other | Admitting: Specialist

## 2019-04-08 ENCOUNTER — Encounter: Payer: Self-pay | Admitting: Adult Health

## 2019-04-09 ENCOUNTER — Encounter: Payer: Self-pay | Admitting: Internal Medicine

## 2019-04-11 ENCOUNTER — Encounter: Payer: Self-pay | Admitting: Specialist

## 2019-04-11 ENCOUNTER — Ambulatory Visit (INDEPENDENT_AMBULATORY_CARE_PROVIDER_SITE_OTHER): Payer: Medicare Other | Admitting: Specialist

## 2019-04-11 VITALS — BP 178/87 | HR 67 | Ht 63.0 in | Wt 154.0 lb

## 2019-04-11 DIAGNOSIS — M48062 Spinal stenosis, lumbar region with neurogenic claudication: Secondary | ICD-10-CM | POA: Diagnosis not present

## 2019-04-11 DIAGNOSIS — M5442 Lumbago with sciatica, left side: Secondary | ICD-10-CM

## 2019-04-11 DIAGNOSIS — G8929 Other chronic pain: Secondary | ICD-10-CM | POA: Diagnosis not present

## 2019-04-11 MED ORDER — METHOCARBAMOL 500 MG PO TABS: 500.0000 mg | ORAL_TABLET | Freq: Two times a day (BID) | ORAL | 0 refills | Status: DC | PRN

## 2019-04-11 NOTE — Progress Notes (Signed)
Office Visit Note   Patient: Rebekah Hamilton           Date of Birth: 08-Nov-1945           MRN: [id number] Visit Date: 04/11/2019              Requested by: Unk Pinto, [address] [place],  [place] [id number] PCP: Unk Pinto, MD   Assessment & Plan: Visit Diagnoses:  1. Spinal stenosis of lumbar region with neurogenic claudication   2. Chronic left-sided low back pain with left-sided sciatica     Plan: Since patient is doing well after left L3-4 transforaminal ESI I will have her follow-up with Dr. Louanne Skye in 6 weeks for recheck.  I did give her a prescription for Robaxin 500 mg 1 tab p.o. every 12 hours as needed to be taken as needed for spasms.  If she has a flareup of her low back pain and left lower extremity radiculopathy she will call to let us know and we will see about possibly scheduling another injection.  All questions answered.  Follow-Up Instructions: Return in about 6 weeks (around 05/23/2019) for with Dr Louanne Skye recheck back.   Orders:  No orders of the defined types were placed in this encounter.  Meds ordered this encounter  Medications   methocarbamol (ROBAXIN) 500 MG tablet    Sig: Take 1 tablet (500 mg total) by mouth every 12 (twelve) hours as needed for muscle spasms.    Dispense:  30 tablet    Refill:  0      Procedures: No procedures performed   Clinical Data: No additional findings.   Subjective: Chief Complaint  Patient presents with   Lower Back - Follow-up    She had a left L3 TF injection at [facility] on 03/13/2019, she states that it did great for her and she is pleased with how well it worked    HPI 73 year old white female returns after having left L3 transforaminal ESI March 13, 2019.  Patient states that this is given her excellent relief.  She is pleased with the result.  She does describe having ongoing neurogenic claudication symptoms but the sciatica pain is gone.  She is able to get  around better.  Objective: Vital Signs: BP (!) 178/87 (BP Location: Left Arm, Patient Position: Sitting)    Pulse 67    Ht 5\' 3"  (1.6 m)    Wt 154 lb (69.9 kg)    BMI 27.28 kg/m   Physical Exam Very pleasant white female alert and oriented in no acute distress.  Ambulating well.  Negative logroll bilateral hips.  Negative straight leg raise. Ortho Exam  Specialty Comments:  No specialty comments available.  Imaging: No results found.   PMFS History: Patient Active Problem List   Diagnosis Date Noted   Chronic left-sided low back pain with left-sided sciatica 07/02/2018   OAB (overactive bladder) 04/02/2018   CKD (chronic kidney disease), symptom management only, stage 2 (mild) 09/24/2017   Encounter for Medicare annual wellness exam 06/02/2015   Glaucoma 06/02/2015   Vitamin D deficiency 10/21/2014   Medication management 12/01/2013   Hyperlipidemia    Hypertension    Other abnormal glucose    Migraines    Allergy    Anxiety    Depression, major, recurrent, in remission ([facility])    Lumbar stenosis    Past Medical History:  Diagnosis Date   Allergy    Anemia    Anxiety  Depression    Hyperlipidemia    Hypertension    Lumbar stenosis    Migraines    Prediabetes     Family History  Problem Relation Age of Onset   Stroke Mother    Hypertension Mother    Heart attack Father 42   Other Maternal Grandmother        blood clot after surgery   Heart attack Paternal Grandmother     Past Surgical History:  Procedure Laterality Date   BREAST LUMPECTOMY Bilateral    CHOLECYSTECTOMY  1999   Social History   Occupational History   Not on file  Tobacco Use   Smoking status: Never Smoker   Smokeless tobacco: Never Used  Substance and Sexual Activity   Alcohol use: Yes    Alcohol/week: 2.0 standard drinks    Types: 2 Glasses of wine per week   Drug use: No   Sexual activity: Not Currently

## 2019-05-01 ENCOUNTER — Other Ambulatory Visit: Payer: Self-pay | Admitting: Adult Health

## 2019-05-06 ENCOUNTER — Encounter: Payer: Self-pay | Admitting: Adult Health

## 2019-05-07 ENCOUNTER — Encounter: Payer: Self-pay | Admitting: Internal Medicine

## 2019-05-07 DIAGNOSIS — I872 Venous insufficiency (chronic) (peripheral): Secondary | ICD-10-CM | POA: Insufficient documentation

## 2019-05-07 DIAGNOSIS — Z8619 Personal history of other infectious and parasitic diseases: Secondary | ICD-10-CM | POA: Insufficient documentation

## 2019-05-07 DIAGNOSIS — Z8616 Personal history of COVID-19: Secondary | ICD-10-CM | POA: Insufficient documentation

## 2019-05-07 NOTE — Patient Instructions (Signed)

## 2019-05-07 NOTE — Progress Notes (Signed)
Comprehensive Evaluation &  Examination     This very nice 73 y.o.  Sep WF presents for a  comprehensive evaluation and management of multiple medical co-morbidities.  Patient has been followed for labile HTN, HLD, Prediabetes  and Vitamin D Deficiency.     In June, after exposure at church,  patient tested (+) Positive for Covid-19 and had minimal 1 day sx's/o dry cough.      Patient has been monitored expectantly for labile HTN since 2018. Patient's BP has been controlled at home and patient denies any cardiac symptoms as chest pain, palpitations, shortness of breath, dizziness or ankle swelling. Today's BP is at goal - 128/70.      Patient's hyperlipidemia is controlled with diet and medications. Patient denies myalgias or other medication SE's. Last lipids were at goal:  Lab Results  Component Value Date   CHOL 152 11/05/2018   HDL 54 11/05/2018   LDLCALC 76 11/05/2018   TRIG 138 11/05/2018   CHOLHDL 2.8 11/05/2018      Patient has hx/o prediabetes (A1c 5.8% / Mar 2015)  and patient denies reactive hypoglycemic symptoms, visual blurring, diabetic polys or paresthesias. Last A1c was near goal:  Lab Results  Component Value Date   HGBA1C 5.8 (H) 11/05/2018      Finally, patient has history of Vitamin D Deficiency ("36" / Mar 2016)   and last Vitamin D was at goal:   Lab Results  Component Value Date   VD25OH 30 12/27/2017   Current Outpatient Medications on File Prior to Visit  Medication Sig  . ALPRAZolam (XANAX) 0.25 MG tablet Take 1/2 to 1 tablet 2 to 3 x / day ONLY if needed for Acute Anxiety Attacks  & please try to limit to 5 days / week to avoid addiction  . Ascorbic Acid (VITAMIN C PO) 500 mg 2 (two) times daily.  Marland Kitchen aspirin 325 MG tablet Take 325 mg by mouth daily.  . Cholecalciferol (VITAMIN D PO) Take by mouth. Takes 10000 to 12000 units daily.  . clotrimazole-betamethasone (LOTRISONE) cream Apply 1 application topically 2 (two) times daily. (Patient taking  differently: Apply 1 application topically as needed. )  . DULoxetine (CYMBALTA) 60 MG capsule TAKE 1 CAPSULE BY MOUTH EVERY DAY  . estradiol (ESTRACE) 0.5 MG tablet TAKE 1 TABLET BY MOUTH EVERY DAY  . fexofenadine (ALLEGRA) 180 MG tablet Take 180 mg by mouth daily.  . pravastatin (PRAVACHOL) 40 MG tablet Take 1 tablet at Bedtime for Cholesterol  . progesterone (PROMETRIUM) 100 MG capsule TAKE ONE CAPSULE BY MOUTH DAILY  . rizatriptan (MAXALT) 10 MG tablet TAKE 1 TABLET BY MOUTH AS NEEDED FOR MIGRAINE, MAY REPEAT DOSE IN 2 HOURS IF NEEDED  . traZODone (DESYREL) 150 MG tablet Take 1/3 tab 1 hour before bedtime if needed for sleep   No current facility-administered medications on file prior to visit.    Allergies  Allergen Reactions  . Augmentin [Amoxicillin-Pot Clavulanate] Diarrhea  . Doxycycline Nausea Only  . Egg White (Diagnostic) Nausea Only  . Prozac [Fluoxetine Hcl] Other (See Comments)    Disoriented  . Wellbutrin [Bupropion] Other (See Comments)    Insomnia/Nervousness   Past Medical History:  Diagnosis Date  . Allergy   . Anemia   . Anxiety   . Depression   . Hyperlipidemia   . Hypertension   . Lumbar stenosis   . Migraines   . Prediabetes    Health Maintenance  Topic Date Due  . DEXA SCAN  02/13/2012  . INFLUENZA VACCINE  03/16/2019  . MAMMOGRAM  06/07/2019  . Fecal DNA (Cologuard)  10/26/2020  . TETANUS/TDAP  07/04/2026  . Hepatitis C Screening  Completed  . PNA vac Low Risk Adult  Completed   Immunization History  Administered Date(s) Administered  . Influenza, High Dose Seasonal PF 05/29/2014, 06/02/2015, 06/16/2017, 05/09/2018  . Influenza,inj,quad, With Preservative 08/12/2013, 07/04/2016  . Pneumococcal Conjugate-13 04/07/2015  . Pneumococcal Polysaccharide-23 05/29/2013  . Td 08/15/2005, 07/04/2016   Cologard - 10/26/2017 - Negative - Recc 3 yr f/u due Mar 2022  Last MGM - 06/06/2018  Past Surgical History:  Procedure Laterality Date  . BREAST  LUMPECTOMY Bilateral   . CHOLECYSTECTOMY  1999   Family History  Problem Relation Age of Onset  . Stroke Mother   . Hypertension Mother   . Heart attack Father 68  . Other Maternal Grandmother        blood clot after surgery  . Heart attack Paternal Grandmother    Social History   Tobacco Use  . Smoking status: Never Smoker  . Smokeless tobacco: Never Used  Substance Use Topics  . Alcohol use: Yes    Alcohol/week: 2.0 standard drinks    Types: 2 Glasses of wine per week  . Drug use: No    ROS Constitutional: Denies fever, chills, weight loss/gain, headaches, insomnia,  night sweats, and change in appetite. Does c/o fatigue. Eyes: Denies redness, blurred vision, diplopia, discharge, itchy, watery eyes.  ENT: Denies discharge, congestion, post nasal drip, epistaxis, sore throat, earache, hearing loss, dental pain, Tinnitus, Vertigo, Sinus pain, snoring.  Cardio: Denies chest pain, palpitations, irregular heartbeat, syncope, dyspnea, diaphoresis, orthopnea, PND, claudication, edema Respiratory: denies cough, dyspnea, DOE, pleurisy, hoarseness, laryngitis, wheezing.  Gastrointestinal: Denies dysphagia, heartburn, reflux, water brash, pain, cramps, nausea, vomiting, bloating, diarrhea, constipation, hematemesis, melena, hematochezia, jaundice, hemorrhoids Genitourinary: Denies dysuria, frequency, urgency, nocturia, hesitancy, discharge, hematuria, flank pain Breast: Breast lumps, nipple discharge, bleeding.  Musculoskeletal: Denies arthralgia, myalgia, stiffness, Jt. Swelling, pain, limp, and strain/sprain. Denies falls. Skin: Denies puritis, rash, hives, warts, acne, eczema, changing in skin lesion Neuro: No weakness, tremor, incoordination, spasms, paresthesia, pain Psychiatric: Denies confusion, memory loss, sensory loss. Denies Depression. Endocrine: Denies change in weight, skin, hair change, nocturia, and paresthesia, diabetic polys, visual blurring, hyper / hypo glycemic  episodes.  Heme/Lymph: No excessive bleeding, bruising, enlarged lymph nodes.  Physical Exam  BP 128/70   Pulse 80   Temp 97.7 F (36.5 C)   Resp 16   Ht 5\' 2"  (1.575 m)   Wt 152 lb 12.8 oz (69.3 kg)   BMI 27.95 kg/m   General Appearance: Well nourished, well groomed and in no apparent distress.  Eyes: PERRLA, EOMs, conjunctiva no swelling or erythema, normal fundi and vessels. Sinuses: No frontal/maxillary tenderness ENT/Mouth: EACs patent / TMs  nl. Nares clear without erythema, swelling, mucoid exudates. Oral hygiene is good. No erythema, swelling, or exudate. Tongue normal, non-obstructing. Tonsils not swollen or erythematous. Hearing normal.  Neck: Supple, thyroid not palpable. No bruits, nodes or JVD. Respiratory: Respiratory effort normal.  BS equal and clear bilateral without rales, rhonci, wheezing or stridor. Cardio: Heart sounds are normal with regular rate and rhythm and no murmurs, rubs or gallops. Peripheral pulses are normal and equal bilaterally without edema. No aortic or femoral bruits. Chest: symmetric with normal excursions and percussion. Breasts: Symmetric, without lumps, nipple discharge, retractions, or fibrocystic changes.  Abdomen: Flat, soft with bowel sounds active. Nontender, no guarding, rebound, hernias,  masses, or organomegaly.  Lymphatics: Non tender without lymphadenopathy.  Genitourinary:  Musculoskeletal: Full ROM all peripheral extremities, joint stability, 5/5 strength, and normal gait. Skin: Warm and dry without rashes, lesions, cyanosis, clubbing or  ecchymosis.  Neuro: Cranial nerves intact, reflexes equal bilaterally. Normal muscle tone, no cerebellar symptoms. Sensation intact.  Pysch: Alert and oriented X 3, normal affect, Insight and Judgment appropriate.   Assessment and Plan  1. Essential hypertension  - EKG 12-Lead - Urinalysis, Routine w reflex microscopic - Microalbumin / creatinine urine ratio - CBC with Differential/Platelet  - COMPLETE METABOLIC PANEL WITH GFR - Magnesium - TSH  2. Hyperlipidemia, mixed  - EKG 12-Lead - Lipid panel - TSH  3. Abnormal glucose  - EKG 12-Lead - Hemoglobin A1c - Insulin, random  4. Vitamin D deficiency  - VITAMIN D 25 Hydroxyl  5. Chronic venous insufficiency   6. History of 2019 novel coronavirus disease (COVID-19)   7. Screening for colorectal cancer  - POC Hemoccult Bld/Stl (3-Cd Home Screen); Future  8. Screening for ischemic heart disease  - EKG 12-Lead  9. Family history of ischemic heart disease  - EKG 12-Lead  10. Migraine without aura   11. Medication management  - Urinalysis, Routine w reflex microscopic - Microalbumin / creatinine urine ratio - CBC with Differential/Platelet - COMPLETE METABOLIC PANEL WITH GFR - Magnesium - Lipid panel - TSH - Hemoglobin A1c - Insulin, random - VITAMIN D 25 Hydroxyl         Patient was counseled in prudent diet to achieve/maintain BMI less than 25 for weight control, BP monitoring, regular exercise and medications. Discussed med's effects and SE's. Screening labs and tests as requested with regular follow-up as recommended. Over 40 minutes of exam, counseling, chart review and high complex critical decision making was performed.   Kirtland Bouchard, MD

## 2019-05-08 ENCOUNTER — Other Ambulatory Visit: Payer: Self-pay

## 2019-05-08 ENCOUNTER — Ambulatory Visit (INDEPENDENT_AMBULATORY_CARE_PROVIDER_SITE_OTHER): Payer: Medicare Other | Admitting: Internal Medicine

## 2019-05-08 VITALS — BP 128/70 | HR 80 | Temp 97.7°F | Resp 16 | Ht 62.0 in | Wt 152.8 lb

## 2019-05-08 DIAGNOSIS — E782 Mixed hyperlipidemia: Secondary | ICD-10-CM

## 2019-05-08 DIAGNOSIS — I10 Essential (primary) hypertension: Secondary | ICD-10-CM

## 2019-05-08 DIAGNOSIS — F3340 Major depressive disorder, recurrent, in remission, unspecified: Secondary | ICD-10-CM | POA: Diagnosis not present

## 2019-05-08 DIAGNOSIS — Z79899 Other long term (current) drug therapy: Secondary | ICD-10-CM

## 2019-05-08 DIAGNOSIS — I872 Venous insufficiency (chronic) (peripheral): Secondary | ICD-10-CM | POA: Diagnosis not present

## 2019-05-08 DIAGNOSIS — R7309 Other abnormal glucose: Secondary | ICD-10-CM

## 2019-05-08 DIAGNOSIS — Z8616 Personal history of COVID-19: Secondary | ICD-10-CM

## 2019-05-08 DIAGNOSIS — Z1211 Encounter for screening for malignant neoplasm of colon: Secondary | ICD-10-CM

## 2019-05-08 DIAGNOSIS — G43009 Migraine without aura, not intractable, without status migrainosus: Secondary | ICD-10-CM

## 2019-05-08 DIAGNOSIS — Z8249 Family history of ischemic heart disease and other diseases of the circulatory system: Secondary | ICD-10-CM | POA: Diagnosis not present

## 2019-05-08 DIAGNOSIS — Z136 Encounter for screening for cardiovascular disorders: Secondary | ICD-10-CM | POA: Diagnosis not present

## 2019-05-08 DIAGNOSIS — E559 Vitamin D deficiency, unspecified: Secondary | ICD-10-CM

## 2019-05-08 DIAGNOSIS — Z8619 Personal history of other infectious and parasitic diseases: Secondary | ICD-10-CM

## 2019-05-08 MED ORDER — BUPROPION HCL ER (XL) 150 MG PO TB24: ORAL_TABLET | ORAL | 3 refills | Status: DC

## 2019-05-09 DIAGNOSIS — Z23 Encounter for immunization: Secondary | ICD-10-CM | POA: Diagnosis not present

## 2019-05-09 LAB — CBC WITH DIFFERENTIAL/PLATELET
Absolute Monocytes: 930 cells/uL (ref 200–950)
Basophils Absolute: 56 cells/uL (ref 0–200)
Basophils Relative: 0.5 %
Eosinophils Absolute: 146 cells/uL (ref 15–500)
Eosinophils Relative: 1.3 %
HCT: 44.2 % (ref 35.0–45.0)
Hemoglobin: 14.8 g/dL (ref 11.7–15.5)
Lymphs Abs: 2934 cells/uL (ref 850–3900)
MCH: 30.6 pg (ref 27.0–33.0)
MCHC: 33.5 g/dL (ref 32.0–36.0)
MCV: 91.5 fL (ref 80.0–100.0)
MPV: 10.4 fL (ref 7.5–12.5)
Monocytes Relative: 8.3 %
Neutro Abs: 7134 cells/uL (ref 1500–7800)
Neutrophils Relative %: 63.7 %
Platelets: 341 10*3/uL (ref 140–400)
RBC: 4.83 10*6/uL (ref 3.80–5.10)
RDW: 13 % (ref 11.0–15.0)
Total Lymphocyte: 26.2 %
WBC: 11.2 10*3/uL — ABNORMAL HIGH (ref 3.8–10.8)

## 2019-05-09 LAB — URINALYSIS, ROUTINE W REFLEX MICROSCOPIC
Bilirubin Urine: NEGATIVE
Glucose, UA: NEGATIVE
Hgb urine dipstick: NEGATIVE
Ketones, ur: NEGATIVE
Leukocytes,Ua: NEGATIVE
Nitrite: NEGATIVE
Protein, ur: NEGATIVE
Specific Gravity, Urine: 1.022 (ref 1.001–1.03)
pH: <=5 (ref 5.0–8.0)

## 2019-05-09 LAB — COMPLETE METABOLIC PANEL WITH GFR
AG Ratio: 1.9 (calc) (ref 1.0–2.5)
ALT: 23 U/L (ref 6–29)
AST: 27 U/L (ref 10–35)
Albumin: 4.3 g/dL (ref 3.6–5.1)
Alkaline phosphatase (APISO): 49 U/L (ref 37–153)
BUN/Creatinine Ratio: 12 (calc) (ref 6–22)
BUN: 12 mg/dL (ref 7–25)
CO2: 28 mmol/L (ref 20–32)
Calcium: 9.8 mg/dL (ref 8.6–10.4)
Chloride: 105 mmol/L (ref 98–110)
Creat: 0.99 mg/dL — ABNORMAL HIGH (ref 0.60–0.93)
GFR, Est African American: 66 mL/min/{1.73_m2} (ref >=60)
GFR, Est Non African American: 57 mL/min/{1.73_m2} — ABNORMAL LOW (ref >=60)
Globulin: 2.3 g/dL (calc) (ref 1.9–3.7)
Glucose, Bld: 66 mg/dL (ref 65–99)
Potassium: 4.8 mmol/L (ref 3.5–5.3)
Sodium: 143 mmol/L (ref 135–146)
Total Bilirubin: 0.5 mg/dL (ref 0.2–1.2)
Total Protein: 6.6 g/dL (ref 6.1–8.1)

## 2019-05-09 LAB — LIPID PANEL
Cholesterol: 152 mg/dL (ref <200)
HDL: 50 mg/dL (ref >=50)
LDL Cholesterol (Calc): 79 mg/dL (calc)
Non-HDL Cholesterol (Calc): 102 mg/dL (calc) (ref <130)
Total CHOL/HDL Ratio: 3 (calc) (ref <5.0)
Triglycerides: 132 mg/dL (ref <150)

## 2019-05-09 LAB — INSULIN, RANDOM: Insulin: 10.7 u[IU]/mL

## 2019-05-09 LAB — HEMOGLOBIN A1C
Hgb A1c MFr Bld: 5.5 % of total Hgb (ref <5.7)
Mean Plasma Glucose: 111 (calc)
eAG (mmol/L): 6.2 (calc)

## 2019-05-09 LAB — MAGNESIUM: Magnesium: 2.2 mg/dL (ref 1.5–2.5)

## 2019-05-09 LAB — VITAMIN D 25 HYDROXY (VIT D DEFICIENCY, FRACTURES): Vit D, 25-Hydroxy: 81 ng/mL (ref 30–100)

## 2019-05-09 LAB — MICROALBUMIN / CREATININE URINE RATIO
Creatinine, Urine: 204 mg/dL (ref 20–275)
Microalb Creat Ratio: 5 mcg/mg creat (ref <30)
Microalb, Ur: 1.1 mg/dL

## 2019-05-09 LAB — TSH: TSH: 2.24 mIU/L (ref 0.40–4.50)

## 2019-05-15 ENCOUNTER — Other Ambulatory Visit: Payer: Self-pay | Admitting: Adult Health

## 2019-05-23 ENCOUNTER — Ambulatory Visit (INDEPENDENT_AMBULATORY_CARE_PROVIDER_SITE_OTHER): Payer: Medicare Other | Admitting: Specialist

## 2019-05-23 ENCOUNTER — Encounter: Payer: Self-pay | Admitting: Specialist

## 2019-05-23 VITALS — BP 137/85 | HR 81 | Ht 63.0 in | Wt 154.0 lb

## 2019-05-23 DIAGNOSIS — M4156 Other secondary scoliosis, lumbar region: Secondary | ICD-10-CM | POA: Diagnosis not present

## 2019-05-23 DIAGNOSIS — M21611 Bunion of right foot: Secondary | ICD-10-CM | POA: Diagnosis not present

## 2019-05-23 DIAGNOSIS — M205X1 Other deformities of toe(s) (acquired), right foot: Secondary | ICD-10-CM

## 2019-05-23 DIAGNOSIS — M48062 Spinal stenosis, lumbar region with neurogenic claudication: Secondary | ICD-10-CM

## 2019-05-23 MED ORDER — DICLOFENAC SODIUM 1 % TD GEL: 2.0000 g | Freq: Four times a day (QID) | TRANSDERMAL | 3 refills | Status: DC

## 2019-05-23 NOTE — Progress Notes (Signed)
Office Visit Note   Patient: Rebekah Hamilton           Date of Birth: [birth year]           MRN: [id number] Visit Date: 05/23/2019              Requested by: Unk Pinto, [address] [place],  [place] [id number] PCP: Unk Pinto, MD   Assessment & Plan: Visit Diagnoses:  1. Spinal stenosis of lumbar region with neurogenic claudication   2. Other secondary scoliosis, lumbar region   3. Bunion of great toe of right foot   4. Acquired claw toe of right foot     Plan: Avoid bending, stooping and avoid lifting weights greater than 10 lbs. Avoid prolong standing and walking. Avoid frequent bending and stooping  No lifting greater than 10 lbs. May use ice or moist heat for pain. Weight loss is of benefit. Handicap license is approved. [facility] will call to arrange for epidural steroid injection  Extra depth and extra width shoes with a good arch support is recommended.    Follow-Up Instructions: No follow-ups on file.   Orders:  Orders Placed This Encounter  Procedures  . Ambulatory referral to Interventional Radiology   No orders of the defined types were placed in this encounter.     Procedures: No procedures performed   Clinical Data: Findings:  CLINICAL DATA:  Low back pain radiating into the left thigh with numbness and tingling. No known injury. Symptoms are chronic.  EXAM: MRI LUMBAR SPINE WITHOUT CONTRAST  TECHNIQUE: Multiplanar, multisequence MR imaging of the lumbar spine was performed. No intravenous contrast was administered.  COMPARISON:  MRI lumbar spine 02/11/2017.  FINDINGS: Segmentation:  Standard.  Alignment: Severe convex right thoracolumbar scoliosis with the apex at L1-2 is unchanged. No listhesis.  Vertebrae: No fracture or worrisome lesion. A few small hemangiomas are seen. Degenerative endplate signal change eccentric to the left L1-2 and L2-3 is noted.   Conus medullaris and cauda equina: Conus extends to the L1-2 level. Conus and cauda equina appear normal.  Paraspinal and other soft tissues: T2 hyperintense lesions in the right kidney most consistent with cysts are unchanged.  Disc levels:  T10-11 is imaged in the sagittal plane only. Disc bulge eccentric to the right and bilateral facet arthropathy are seen. The ventral thecal sac appears effaced. The left foramen is open. Moderate right foraminal narrowing is noted.  T11-12: Moderate bilateral facet degenerative disease. Shallow disc bulge to the left. The central canal and foramina appear open.  T12-L1: Central and eccentric left disc protrusion indents the ventral thecal sac but the central canal and foramina appear open.  L1-2: Disc bulge toward the left and mild-to-moderate facet degenerative change, worse on the left. Mild narrowing in the left lateral recess is identified. The foramina are open. The appearance is unchanged.  L2-3: Moderate to advanced facet degenerative change is worse on the left. Shallow disc bulge to the left is seen and there is some ligamentum flavum thickening. Moderate to moderately severe central canal stenosis is worse toward the left and in particular in the left lateral recess. The foramina are open. The appearance is unchanged.  L3-4: Advanced facet degenerative change, bulky ligamentum flavum thickening and a broad-based central protrusion cause moderately severe to severe central canal and bilateral lateral recess stenosis. The right foramen is open. Moderately severe left foraminal narrowing is present. The appearance is unchanged.  L4-5: Moderate bilateral facet  arthropathy appears worse on the right. There is ligamentum flavum thickening, worse on the right. Shallow broad-based right paracentral protrusion causes moderate to moderately severe central canal stenosis and narrowing in the right lateral recess. The foramina  appear open.  L5-S1: Broad-based disc bulge and endplate spur eccentrically prominent to the right. There is some ligamentum flavum thickening and facet arthropathy. Moderately severe central canal stenosis is present and there is narrowing in the subarticular recesses, worse on the right. The left foramen is open. Severe right foraminal narrowing is present.  IMPRESSION: No change in the appearance of the lumbar spine since the most recent examination.  Moderate to moderately severe central canal stenosis at L2-3 is worse toward the left and in the left lateral recess.  Moderately severe left foraminal narrowing and moderately severe to severe central canal and bilateral lateral recess stenosis at L3-4.  Moderate to moderately severe central canal stenosis and narrowing in the right lateral recess at L4-5.  Right worse than left subarticular recess narrowing at L5-S1. Severe right foraminal narrowing is also present at this level.  Severe convex right thoracolumbar scoliosis.   Electronically Signed   By: Inge Rise M.D.   On: 10/19/2017 10:22    Subjective: Chief Complaint  Patient presents with  . Lower Back - Follow-up    Had a Left L3-4 TF injection @ [facility], states that it has done well, and she states that she is noticing the pain returning.    73 year old female with L3-4 lumbar spinal stenosis and lumbar scoliosis. She did well with ESI, pain seems to be recurring she had the injection. No bowel or bladder difficulty. Pain with going up stairs, has steps to get into the house. Feels wobbly some with walking. The pain is in the left leg and she is using lidocaine patches to decrease the pain.  The sitting seems to increase the pain and there is weakness compared with the other right leg, aching pain left lateral hip and trochanter but sometimes it is in the left anterior hip and groin.    Review of Systems  Constitutional: Negative.   Negative for chills, diaphoresis, fatigue, fever and unexpected weight change.  HENT: Negative.  Negative for congestion, dental problem, drooling, ear discharge, ear pain, facial swelling, hearing loss, mouth sores, nosebleeds, postnasal drip, rhinorrhea, sinus pressure, sinus pain, sneezing, sore throat, tinnitus, trouble swallowing and voice change.   Eyes: Negative.  Negative for photophobia, pain, discharge, redness, itching and visual disturbance.  Respiratory: Negative.  Negative for apnea, cough, choking, chest tightness, shortness of breath, wheezing and stridor.   Cardiovascular: Negative.  Negative for chest pain, palpitations and leg swelling.  Gastrointestinal: Negative.  Negative for abdominal distention, abdominal pain, anal bleeding, blood in stool, constipation, diarrhea, nausea, rectal pain and vomiting.  Endocrine: Negative for cold intolerance, heat intolerance, polydipsia, polyphagia and polyuria.  Genitourinary: Negative.  Negative for difficulty urinating, dyspareunia, dysuria, enuresis, flank pain and frequency.  Musculoskeletal: Positive for back pain. Negative for arthralgias, gait problem, joint swelling, myalgias, neck pain and neck stiffness.  Skin: Negative.  Negative for color change, pallor, rash and wound.  Allergic/Immunologic: Negative for environmental allergies, food allergies and immunocompromised state.  Neurological: Positive for weakness and numbness. Negative for dizziness, tremors, seizures, syncope, facial asymmetry, speech difficulty, light-headedness and headaches.  Hematological: Negative for adenopathy. Does not bruise/bleed easily.  Psychiatric/Behavioral: Negative for agitation, behavioral problems, confusion, decreased concentration, dysphoric mood, hallucinations, self-injury, sleep disturbance and suicidal ideas. The patient  is not nervous/anxious and is not hyperactive.      Objective: Vital Signs: BP 137/85 (BP Location: Left Arm, Patient  Position: Sitting)   Pulse 81   Ht 5\' 3"  (1.6 m)   Wt 154 lb (69.9 kg)   BMI 27.28 kg/m   Physical Exam Constitutional:      Appearance: She is well-developed.  HENT:     Head: Normocephalic and atraumatic.  Eyes:     Pupils: Pupils are equal, round, and reactive to light.  Neck:     Musculoskeletal: Normal range of motion and neck supple.  Pulmonary:     Effort: Pulmonary effort is normal.     Breath sounds: Normal breath sounds.  Abdominal:     General: Bowel sounds are normal.     Palpations: Abdomen is soft.  Skin:    General: Skin is warm and dry.  Neurological:     Mental Status: She is alert and oriented to person, place, and time.  Psychiatric:        Behavior: Behavior normal.        Thought Content: Thought content normal.        Judgment: Judgment normal.     Right Ankle Exam   Comments:  Right great toe with bunion and there is clawing of the 2nd toe with some cross over deformity and some rotation of the right second toe. A callus underlying the 2nd MTP joint.   Back Exam   Tenderness  The patient is experiencing tenderness in the lumbar.  Range of Motion  Extension: abnormal  Flexion: abnormal  Lateral bend right: normal  Lateral bend left: normal  Rotation right: normal  Rotation left: normal   Muscle Strength  Right Quadriceps:  5/5  Left Quadriceps:  5/5  Right Hamstrings:  5/5  Left Hamstrings:  5/5   Tests  Straight leg raise right: negative Straight leg raise left: negative  Reflexes  Patellar: normal Achilles: normal Biceps: normal Babinski's sign: normal   Other  Toe walk: normal Heel walk: normal Sensation: normal Gait: normal       Specialty Comments:  No specialty comments available.  Imaging: No results found.   PMFS History: Patient Active Problem List   Diagnosis Date Noted  . Chronic venous insufficiency 05/07/2019  . History of 2019 novel coronavirus disease (COVID-19) 05/07/2019  . Chronic  left-sided low back pain with left-sided sciatica 07/02/2018  . OAB (overactive bladder) 04/02/2018  . CKD (chronic kidney disease), symptom management only, stage 2 (mild) 09/24/2017  . Encounter for Medicare annual wellness exam 06/02/2015  . Glaucoma 06/02/2015  . Vitamin D deficiency 10/21/2014  . Medication management 12/01/2013  . Hyperlipidemia, mixed   . Essential hypertension   . Abnormal glucose   . Migraines   . Allergy   . Anxiety   . Depression, major, recurrent, in remission ([place])   . Lumbar stenosis    Past Medical History:  Diagnosis Date  . Allergy   . Anemia   . Anxiety   . Depression   . Hyperlipidemia   . Hypertension   . Lumbar stenosis   . Migraines   . Prediabetes     Family History  Problem Relation Age of Onset  . Stroke Mother   . Hypertension Mother   . Heart attack Father 19  . Other Maternal Grandmother        blood clot after surgery  . Heart attack Paternal Grandmother     Past Surgical History:  Procedure Laterality Date  . BREAST LUMPECTOMY Bilateral   . CHOLECYSTECTOMY  1999   Social History   Occupational History  . Not on file  Tobacco Use  . Smoking status: Never Smoker  . Smokeless tobacco: Never Used  Substance and Sexual Activity  . Alcohol use: Yes    Alcohol/week: 2.0 standard drinks    Types: 2 Glasses of wine per week  . Drug use: No  . Sexual activity: Not Currently

## 2019-05-23 NOTE — Patient Instructions (Addendum)
Plan: Avoid bending, stooping and avoid lifting weights greater than 10 lbs. Avoid prolong standing and walking. Avoid frequent bending and stooping  No lifting greater than 10 lbs. May use ice or moist heat for pain. Weight loss is of benefit. Handicap license is approved. [facility] will call to arrange for epidural steroid injection  Extra depth and extra width shoes with a good arch support is recommended.

## 2019-05-30 ENCOUNTER — Other Ambulatory Visit: Payer: Self-pay | Admitting: Specialist

## 2019-05-30 DIAGNOSIS — M48062 Spinal stenosis, lumbar region with neurogenic claudication: Secondary | ICD-10-CM

## 2019-06-04 ENCOUNTER — Ambulatory Visit
Admission: RE | Admit: 2019-06-04 | Discharge: 2019-06-04 | Disposition: A | Discharge status: Home / Self Care | Payer: Medicare Other | Source: Ambulatory Visit | Attending: Specialist | Admitting: Specialist

## 2019-06-04 DIAGNOSIS — M48062 Spinal stenosis, lumbar region with neurogenic claudication: Secondary | ICD-10-CM

## 2019-06-04 DIAGNOSIS — M545 Low back pain: Secondary | ICD-10-CM | POA: Diagnosis not present

## 2019-06-04 MED ORDER — METHYLPREDNISOLONE ACETATE 40 MG/ML INJ SUSP (RADIOLOG
120.0000 mg | Freq: Once | INTRAMUSCULAR | Status: AC
  Administered 2019-06-04: 120 mg via EPIDURAL

## 2019-06-04 MED ORDER — IOPAMIDOL (ISOVUE-M 200) INJECTION 41%
1.0000 mL | Freq: Once | INTRAMUSCULAR | Status: AC
  Administered 2019-06-04: 1 mL via EPIDURAL

## 2019-06-19 ENCOUNTER — Ambulatory Visit (INDEPENDENT_AMBULATORY_CARE_PROVIDER_SITE_OTHER): Payer: Medicare Other | Admitting: Internal Medicine

## 2019-06-19 ENCOUNTER — Other Ambulatory Visit: Payer: Self-pay

## 2019-06-19 VITALS — BP 144/82 | HR 76 | Temp 97.2°F | Resp 16 | Ht 62.0 in | Wt 151.6 lb

## 2019-06-19 DIAGNOSIS — F3340 Major depressive disorder, recurrent, in remission, unspecified: Secondary | ICD-10-CM | POA: Diagnosis not present

## 2019-06-19 DIAGNOSIS — I10 Essential (primary) hypertension: Secondary | ICD-10-CM

## 2019-06-19 NOTE — Progress Notes (Signed)
   Subjective:    Patient ID: Rebekah Hamilton, female    DOB: 06/28/1946, 73 y.o.   MRN: [id number]  HPI   Patient returns for 6 week f/u after adding Bupropion 150 mg XL and report a feeling of improved mood altho still "battling" with estranged spouse on a divorce settlement. Apparently feeling much better wrt mood and w/o SE's.   Medication Sig  . ALPRAZolam (XANAX) 0.25 MG tablet Take 1/2 to 1 tablet 2 to 3 x / day ONLY if needed for Acute Anxiety Attacks  & please try to limit to 5 days / week to avoid addiction  . Ascorbic Acid (VITAMIN C PO) 500 mg 2 (two) times daily.  Marland Kitchen aspirin 325 MG tablet Take 325 mg by mouth daily.  Marland Kitchen buOPion (WELLBUTRIN XL) 150 MG 24 hr tablet Take 1 tablet every Morning for Mood, Focus & Concentration  . Cholecalciferol (VITAMIN D PO) Take by mouth. Takes 10000 to 12000 units daily.  . clotrimazole-betamethasone (LOTRISONE) cream Apply 1 application topically 2 (two) times daily. (Patient taking differently: Apply 1 application topically as needed. )  . diclofenac sodium (VOLTAREN) 1 % GEL Apply 2 g topically 4 (four) times daily.  . DULoxetine (CYMBALTA) 60 MG capsule TAKE 1 CAPSULE BY MOUTH EVERY DAY  . estradiol (ESTRACE) 0.5 MG tablet TAKE 1 TABLET BY MOUTH EVERY DAY  . fexofenadine (ALLEGRA) 180 MG tablet Take 180 mg by mouth daily.  . pravastatin (PRAVACHOL) 40 MG tablet Take 1 tablet at Bedtime for Cholesterol  . progesterone (PROMETRIUM) 100 MG capsule TAKE ONE CAPSULE BY MOUTH DAILY  . rizatriptan (MAXALT) 10 MG tablet Take 1 tablet for Migraine & may repeat 1 x in 2 hrs - ((Maximum 2 tablets  /24 hours))  . traZODone (DESYREL) 150 MG tablet Take 1/3 tab 1 hour before bedtime if needed for sleep   Allergies  Allergen Reactions  . Augmentin [Amoxicillin-Pot Clavulanate] Diarrhea  . Doxycycline Nausea Only  . Egg White (Diagnostic) Nausea Only  . Prozac [Fluoxetine Hcl] Other (See Comments)    Disoriented   Past Medical History:  Diagnosis  Date  . Allergy   . Anemia   . Anxiety   . Depression   . Hyperlipidemia   . Hypertension   . Lumbar stenosis   . Migraines   . Prediabetes    Past Surgical History:  Procedure Laterality Date  . BREAST LUMPECTOMY Bilateral   . CHOLECYSTECTOMY  1999   Review of Systems  10 point systems review negative except as above.    Objective:   Physical Exam  BP (!) 144/82   Pulse 76   Temp (!) 97.2 F (36.2 C)   Resp 16   Ht 5\' 2"  (1.575 m)   Wt 151 lb 9.6 oz (68.8 kg)   BMI 27.73 kg/m   HEENT - WNL. Neck - supple.  Chest - Clear equal BS. Cor - Nl HS. RRR w/o sig MGR. PP 1(+). No edema. MS- FROM w/o deformities.  Gait Nl. Neuro -  Nl w/o focal abnormalities. A & O x 3. No Ideations. Insight is Normal.    Assessment & Plan:   1. Essential hypertension  2. Depression, major, recurrent, in remission ([place])  - advised increase Wellbutrin150 XL to 2 tabs  (300 mg) and call for Rx for 300 mg XL when needed

## 2019-06-22 ENCOUNTER — Encounter: Payer: Self-pay | Admitting: Internal Medicine

## 2019-07-18 ENCOUNTER — Ambulatory Visit: Payer: Medicare Other | Admitting: Specialist

## 2019-07-21 DIAGNOSIS — Z20828 Contact with and (suspected) exposure to other viral communicable diseases: Secondary | ICD-10-CM | POA: Diagnosis not present

## 2019-07-30 ENCOUNTER — Other Ambulatory Visit: Payer: Self-pay | Admitting: *Deleted

## 2019-07-30 MED ORDER — PROGESTERONE MICRONIZED 100 MG PO CAPS: ORAL_CAPSULE | ORAL | 3 refills | Status: DC

## 2019-08-07 NOTE — Progress Notes (Signed)
FOLLOW UP  Assessment and Plan:   Hypertension Fairly controlled with current medications  Monitor blood pressure at home; patient to call if consistently greater than 130/80 Continue DASH diet.   Reminder to go to the ER if any CP, SOB, nausea, dizziness, severe HA, changes vision/speech, left arm numbness and tingling and jaw pain.  Cholesterol Currently at goal; continue pravastatin  Continue low cholesterol diet and exercise.  Check lipid panel.   Other abnormal glucose Recent A1Cs at goal Discussed diet/exercise, weight management  Defer A1C; check CMP  BMI 27 Continue to recommend diet heavy in fruits and veggies and low in animal meats, cheeses, and dairy products, appropriate calorie intake Discuss exercise recommendations routinely Continue to monitor weight at each visit  Vitamin D Def At goal at last visit; continue supplementation to maintain goal of 70-100 Defer Vit D level  Major depression/situational stres/ anxiety Continue Cymbalta for mood and pain; using xanax sparingly; discussed buspirone for current anxiety breakthrough and situational stress; initiate 5-10 mg TID, follow up 4-6 weeks or sooner if any concerns or SE Lifestyle discussed: diet/exerise, sleep hygiene, stress management, hydration  OAB Improved, off of meds, monitor  Chronic lumbar pain with sciatica Following with Dr. Louanne Skye; encouraged ongoing exercise, PT, water aerobics when appropriate with pandemic; trying to avoid surgery  Continue diet and meds as discussed. Further disposition pending results of labs. Discussed med's effects and SE's.   Over 30 minutes of exam, counseling, chart review, and critical decision making was performed.   Future Appointments  Date Time Provider [place]  08/19/2019  1:45 PM Jessy Oto, MD [facility] None  11/11/2019  2:00 PM Liane Comber, NP [facility] None  06/04/2020  2:00 PM Unk Pinto, MD [facility] None     ----------------------------------------------------------------------------------------------------------------------  HPI 73 y.o. female  presents for 3 month follow up on hypertension, cholesterol, glucose management, weight and vitamin D deficiency.   She has been following with Dr. Durward Fortes, now Dr. Gerilyn Pilgrim for chronic L lower back pain with sciatica, initially right, now more on left, and getting injections, did PT, trying to avoid surgery. On muscle relaxer; was given gabapentin 100 mg but couldn't tolerate.   She was on oxybutynin OAB recently improved and off of medications.   she has a diagnosis of depression/anxiety related to her divorce which is dragging out now for several years, then husband passed away unexpectedly, family trouble with step kids. She reports severe anxiety and stress, feels like she can't think. Was started on Wellbutrin and trazodone but stopped as didn't feel this helped/felt "out of it." xanax PRN, . she reports using xanax sparingly; will take 1/2 tab only; none in a few weeks; hasn't refilled since 2019.   BMI is Body mass index is 27.25 kg/m., she has not been working on diet and exercise other than PT, limited by back pain.  Wt Readings from Last 3 Encounters:  08/12/19 149 lb (67.6 kg)  06/19/19 151 lb 9.6 oz (68.8 kg)  05/23/19 154 lb (69.9 kg)   Her blood pressure has been controlled at home, today their BP is BP: 138/80  She does workout. She denies chest pain, shortness of breath, dizziness.   She is on cholesterol medication Pravastatin and denies myalgias. Her cholesterol is at goal. The cholesterol last visit was:   Lab Results  Component Value Date   CHOL 152 05/08/2019   HDL 50 05/08/2019   LDLCALC 79 05/08/2019   TRIG 132 05/08/2019   CHOLHDL 3.0 05/08/2019  She has been working on diet and exercise for glucose management, and denies increased appetite, nausea, paresthesia of the feet, polydipsia, polyuria and visual  disturbances. Last A1C in the office was:  Lab Results  Component Value Date   HGBA1C 5.5 05/08/2019   Patient is on Vitamin D supplement.   Lab Results  Component Value Date   VD25OH 55 05/08/2019        Current Medications:  Current Outpatient Medications on File Prior to Visit  Medication Sig  . ALPRAZolam (XANAX) 0.25 MG tablet Take 1/2 to 1 tablet 2 to 3 x / day ONLY if needed for Acute Anxiety Attacks  & please try to limit to 5 days / week to avoid addiction  . Ascorbic Acid (VITAMIN C PO) 500 mg 2 (two) times daily.  Marland Kitchen aspirin 325 MG tablet Take 325 mg by mouth daily.  . Cholecalciferol (VITAMIN D PO) Take by mouth. Takes 10000 to 12000 units daily.  . clotrimazole-betamethasone (LOTRISONE) cream Apply 1 application topically 2 (two) times daily. (Patient taking differently: Apply 1 application topically as needed. )  . diclofenac sodium (VOLTAREN) 1 % GEL Apply 2 g topically 4 (four) times daily.  . DULoxetine (CYMBALTA) 60 MG capsule TAKE 1 CAPSULE BY MOUTH EVERY DAY  . estradiol (ESTRACE) 0.5 MG tablet TAKE 1 TABLET BY MOUTH EVERY DAY  . fexofenadine (ALLEGRA) 180 MG tablet Take 180 mg by mouth daily.  . pravastatin (PRAVACHOL) 40 MG tablet Take 1 tablet at Bedtime for Cholesterol  . progesterone (PROMETRIUM) 100 MG capsule TAKE ONE CAPSULE BY MOUTH DAILY  . rizatriptan (MAXALT) 10 MG tablet Take 1 tablet for Migraine & may repeat 1 x in 2 hrs - ((Maximum 2 tablets  /24 hours))  . zinc gluconate 50 MG tablet Take 50 mg by mouth daily.  Marland Kitchen buPROPion (WELLBUTRIN XL) 150 MG 24 hr tablet Take 1 tablet every Morning for Mood, Focus & Concentration (Patient not taking: Reported on 08/12/2019)  . traZODone (DESYREL) 150 MG tablet Take 1/3 tab 1 hour before bedtime if needed for sleep (Patient not taking: Reported on 08/12/2019)   No current facility-administered medications on file prior to visit.     Allergies:  Allergies  Allergen Reactions  . Augmentin [Amoxicillin-Pot  Clavulanate] Diarrhea  . Doxycycline Nausea Only  . Egg White (Diagnostic) Nausea Only  . Prozac [Fluoxetine Hcl] Other (See Comments)    Disoriented     Medical History:  Past Medical History:  Diagnosis Date  . Allergy   . Anemia   . Anxiety   . Depression   . Hyperlipidemia   . Hypertension   . Lumbar stenosis   . Migraines   . Prediabetes    Family history- Reviewed and unchanged Social history- Reviewed and unchanged   Review of Systems:  Review of Systems  Constitutional: Negative for malaise/fatigue and weight loss.  HENT: Negative for hearing loss and tinnitus.   Eyes: Negative for blurred vision and double vision.  Respiratory: Negative for cough, shortness of breath and wheezing.   Cardiovascular: Negative for chest pain, palpitations, orthopnea, claudication and leg swelling.  Gastrointestinal: Negative for abdominal pain, blood in stool, constipation, diarrhea, heartburn, melena, nausea and vomiting.  Genitourinary: Negative.   Musculoskeletal: Positive for back pain (lower back with left sciatica). Negative for falls, joint pain and myalgias.  Skin: Negative for rash.  Neurological: Negative for dizziness, tingling, sensory change, weakness and headaches.  Endo/Heme/Allergies: Negative for polydipsia.  Psychiatric/Behavioral: Positive for depression. Negative for  hallucinations, substance abuse and suicidal ideas. The patient is nervous/anxious. The patient does not have insomnia.   All other systems reviewed and are negative.   Physical Exam: BP 138/80   Pulse 68   Temp (!) 97.5 F (36.4 C)   Wt 149 lb (67.6 kg)   SpO2 99%   BMI 27.25 kg/m  Wt Readings from Last 3 Encounters:  08/12/19 149 lb (67.6 kg)  06/19/19 151 lb 9.6 oz (68.8 kg)  05/23/19 154 lb (69.9 kg)   General Appearance: Well nourished, in no apparent distress. Eyes: PERRLA, EOMs, conjunctiva no swelling or erythema Sinuses: No Frontal/maxillary tenderness ENT/Mouth: Ext aud canals  clear, TMs without erythema, bulging. No erythema, swelling, or exudate on post pharynx.  Tonsils not swollen or erythematous. Hearing normal.  Neck: Supple, thyroid normal.  Respiratory: Respiratory effort normal, BS equal bilaterally without rales, rhonchi, wheezing or stridor.  Cardio: RRR with no MRGs. Brisk peripheral pulses without edema.  Abdomen: Soft, + BS.  Non tender, no guarding, rebound, hernias, masses. Lymphatics: Non tender without lymphadenopathy.  Musculoskeletal: Full ROM, 5/5 strength, Normal gait, neg straight leg raise Skin: Warm, dry without rashes, lesions, ecchymosis.  Neuro: Cranial nerves intact. No cerebellar symptoms. LE sensation intact. Symmetrically diminished patellar/achilles reflexes.  Psych: Awake and oriented X 3, depressed affect, Insight and Judgment appropriate.    Izora Ribas, NP 9:56 AM [facility]

## 2019-08-12 ENCOUNTER — Encounter: Payer: Self-pay | Admitting: Adult Health

## 2019-08-12 ENCOUNTER — Other Ambulatory Visit: Payer: Self-pay

## 2019-08-12 ENCOUNTER — Ambulatory Visit (INDEPENDENT_AMBULATORY_CARE_PROVIDER_SITE_OTHER): Payer: Medicare Other | Admitting: Adult Health

## 2019-08-12 VITALS — BP 138/80 | HR 68 | Temp 97.5°F | Wt 149.0 lb

## 2019-08-12 DIAGNOSIS — N182 Chronic kidney disease, stage 2 (mild): Secondary | ICD-10-CM

## 2019-08-12 DIAGNOSIS — Z6827 Body mass index (BMI) 27.0-27.9, adult: Secondary | ICD-10-CM

## 2019-08-12 DIAGNOSIS — R7309 Other abnormal glucose: Secondary | ICD-10-CM

## 2019-08-12 DIAGNOSIS — E782 Mixed hyperlipidemia: Secondary | ICD-10-CM

## 2019-08-12 DIAGNOSIS — I872 Venous insufficiency (chronic) (peripheral): Secondary | ICD-10-CM

## 2019-08-12 DIAGNOSIS — E559 Vitamin D deficiency, unspecified: Secondary | ICD-10-CM | POA: Diagnosis not present

## 2019-08-12 DIAGNOSIS — I10 Essential (primary) hypertension: Secondary | ICD-10-CM | POA: Diagnosis not present

## 2019-08-12 DIAGNOSIS — F3340 Major depressive disorder, recurrent, in remission, unspecified: Secondary | ICD-10-CM

## 2019-08-12 DIAGNOSIS — F411 Generalized anxiety disorder: Secondary | ICD-10-CM

## 2019-08-12 DIAGNOSIS — F419 Anxiety disorder, unspecified: Secondary | ICD-10-CM

## 2019-08-12 DIAGNOSIS — Z79899 Other long term (current) drug therapy: Secondary | ICD-10-CM | POA: Diagnosis not present

## 2019-08-12 MED ORDER — BUSPIRONE HCL 10 MG PO TABS: ORAL_TABLET | ORAL | 1 refills | Status: DC

## 2019-08-12 MED ORDER — ALPRAZOLAM 0.25 MG PO TABS: ORAL_TABLET | ORAL | 0 refills | Status: DC

## 2019-08-12 NOTE — Patient Instructions (Addendum)
Start buspirone 1/2 tab (5 mg) three times a day for 2 weeks - gradually increase up to 10 mg three times a day if needed for anxiety     Stress Stress is a normal reaction to life events. Stress is what you feel when life demands more than you are used to, or more than you think you can handle. Some stress can be useful, such as studying for a test or meeting a deadline at work. Stress that occurs too often or for too long can cause problems. It can affect your emotional health and interfere with relationships and normal daily activities. Too much stress can weaken your body's defense system (immune system) and increase your risk for physical illness. If you already have a medical problem, stress can make it worse. What are the causes? All sorts of life events can cause stress. An event that causes stress for one person may not be stressful for another person. Major life events, whether positive or negative, commonly cause stress. Examples include:  Losing a job or starting a new job.  Losing a loved one.  Moving to a new town or home.  Getting married or divorced.  Having a baby.  Injury or illness. Less obvious life events can also cause stress, especially if they occur day after day or in combination with each other. Examples include:  Working long hours.  Driving in traffic.  Caring for children.  Being in debt.  Being in a difficult relationship. What are the signs or symptoms? Stress can cause emotional symptoms, including:  Anxiety. This is feeling worried, afraid, on edge, overwhelmed, or out of control.  Anger, including irritation or impatience.  Depression. This is feeling sad, down, helpless, or guilty.  Trouble focusing, remembering, or making decisions. Stress can cause physical symptoms, including:  Aches and pains. These may affect your head, neck, back, stomach, or other areas of your body.  Tight muscles or a clenched jaw.  Low energy.  Trouble  sleeping. Stress can cause unhealthy behaviors, including:  Eating to feel better (overeating) or skipping meals.  Working too much or putting off tasks.  Smoking, drinking alcohol, or using drugs to feel better. How is this diagnosed? Stress is diagnosed through an assessment by your health care provider. He or she may diagnose this condition based on:  Your symptoms and any stressful life events.  Your medical history.  Tests to rule out other causes of your symptoms. Depending on your condition, your health care provider may refer you to a specialist for further evaluation. How is this treated?  Stress management techniques are the recommended treatment for stress. Medicine is not typically recommended for the treatment of stress. Techniques to reduce your reaction to stressful life events include:  Stress identification. Monitor yourself for symptoms of stress and identify what causes stress for you. These skills may help you to avoid or prepare for stressful events.  Time management. Set your priorities, keep a calendar of events, and learn to say "no." Taking these actions can help you avoid making too many commitments. Techniques for coping with stress include:  Rethinking the problem. Try to think realistically about stressful events rather than ignoring them or overreacting. Try to find the positives in a stressful situation rather than focusing on the negatives.  Exercise. Physical exercise can release both physical and emotional tension. The key is to find a form of exercise that you enjoy and do it regularly.  Relaxation techniques. These relax the body and  mind. The key is to find one or more that you enjoy and use the technique(s) regularly. Examples include: ? Meditation, deep breathing, or progressive relaxation techniques. ? Yoga or tai chi. ? Biofeedback, mindfulness techniques, or journaling. ? Listening to music, being out in nature, or participating in other  hobbies.  Practicing a healthy lifestyle. Eat a balanced diet, drink plenty of water, limit or avoid caffeine, and get plenty of sleep.  Having a strong support network. Spend time with family, friends, or other people you enjoy being around. Express your feelings and talk things over with someone you trust. Counseling or talk therapy with a mental health professional may be helpful if you are having trouble managing stress on your own. Follow these instructions at home: Lifestyle   Avoid drugs.  Do not use any products that contain nicotine or tobacco, such as cigarettes and e-cigarettes. If you need help quitting, ask your health care provider.  Limit alcohol intake to no more than 1 drink a day for nonpregnant women and 2 drinks a day for men. One drink equals 12 oz of beer, 5 oz of wine, or 1 oz of hard liquor.  Do not use alcohol or drugs to relax.  Eat a balanced diet that includes fresh fruits and vegetables, whole grains, lean meats, fish, eggs, and beans, and low-fat dairy. Avoid processed foods and foods high in added fat, sugar, and salt.  Exercise at least 30 minutes on 5 or more days each week.  Get 7-8 hours of sleep each night. General instructions   Practice stress management techniques as discussed with your health care provider.  Drink enough fluid to keep your urine clear or pale yellow.  Take over-the-counter and prescription medicines only as told by your health care provider.  Keep all follow-up visits as told by your health care provider. This is important. Contact a health care provider if:  Your symptoms get worse.  You have new symptoms.  You feel overwhelmed by your problems and can no longer manage them on your own. Get help right away if:  You have thoughts of hurting yourself or others. If you ever feel like you may hurt yourself or others, or have thoughts about taking your own life, get help right away. You can go to your nearest emergency  department or call:  Your local emergency services (911 in the [place]  A suicide crisis helpline, such as the [place] at 785 813 9231. This is open 24 hours a day. Summary  Stress is a normal reaction to life events. It can cause problems if it happens too often or for too long.  Practicing stress management techniques is the best way to treat stress.  Counseling or talk therapy with a mental health professional may be helpful if you are having trouble managing stress on your own. This information is not intended to replace advice given to you by your health care provider. Make sure you discuss any questions you have with your health care provider. Document Released: 01/25/2001 Document Revised: 07/14/2017 Document Reviewed: 09/21/2016 Elsevier Patient Education  [employer].    Buspirone tablets What is this medicine? BUSPIRONE (byoo SPYE rone) is used to treat anxiety disorders. This medicine may be used for other purposes; ask your health care provider or pharmacist if you have questions. COMMON BRAND NAME(S): BuSpar What should I tell my health care provider before I take this medicine? They need to know if you have any of these conditions:  kidney or liver disease  an unusual or allergic reaction to buspirone, other medicines, foods, dyes, or preservatives  pregnant or trying to get pregnant  breast-feeding How should I use this medicine? Take this medicine by mouth with a glass of water. Follow the directions on the prescription label. You may take this medicine with or without food. To ensure that this medicine always works the same way for you, you should take it either always with or always without food. Take your doses at regular intervals. Do not take your medicine more often than directed. Do not stop taking except on the advice of your doctor or health care professional. Talk to your pediatrician regarding the use of this medicine  in children. Special care may be needed. Overdosage: If you think you have taken too much of this medicine contact a poison control center or emergency room at once. NOTE: This medicine is only for you. Do not share this medicine with others. What if I miss a dose? If you miss a dose, take it as soon as you can. If it is almost time for your next dose, take only that dose. Do not take double or extra doses. What may interact with this medicine? Do not take this medicine with any of the following medications:  linezolid  MAOIs like Carbex, Eldepryl, Marplan, Nardil, and Parnate  methylene blue  procarbazine This medicine may also interact with the following medications:  diazepam  digoxin  diltiazem  erythromycin  grapefruit juice  haloperidol  medicines for mental depression or mood problems  medicines for seizures like carbamazepine, phenobarbital and phenytoin  nefazodone  other medications for anxiety  rifampin  ritonavir  some antifungal medicines like itraconazole, ketoconazole, and voriconazole  verapamil  warfarin This list may not describe all possible interactions. Give your health care provider a list of all the medicines, herbs, non-prescription drugs, or dietary supplements you use. Also tell them if you smoke, drink alcohol, or use illegal drugs. Some items may interact with your medicine. What should I watch for while using this medicine? Visit your doctor or health care professional for regular checks on your progress. It may take 1 to 2 weeks before your anxiety gets better. You may get drowsy or dizzy. Do not drive, use machinery, or do anything that needs mental alertness until you know how this drug affects you. Do not stand or sit up quickly, especially if you are an older patient. This reduces the risk of dizzy or fainting spells. Alcohol can make you more drowsy and dizzy. Avoid alcoholic drinks. What side effects may I notice from receiving  this medicine? Side effects that you should report to your doctor or health care professional as soon as possible:  blurred vision or other vision changes  chest pain  confusion  difficulty breathing  feelings of hostility or anger  muscle aches and pains  numbness or tingling in hands or feet  ringing in the ears  skin rash and itching  vomiting  weakness Side effects that usually do not require medical attention (report to your doctor or health care professional if they continue or are bothersome):  disturbed dreams, nightmares  headache  nausea  restlessness or nervousness  sore throat and nasal congestion  stomach upset This list may not describe all possible side effects. Call your doctor for medical advice about side effects. You may report side effects to FDA at 1-800-FDA-1088. Where should I keep my medicine? Keep out of the reach of children.  Store at room temperature below 30 degrees C (86 degrees F). Protect from light. Keep container tightly closed. Throw away any unused medicine after the expiration date. NOTE: This sheet is a summary. It may not cover all possible information. If you have questions about this medicine, talk to your doctor, pharmacist, or health care provider.  2020 Elsevier/Gold Standard (2010-03-11 18:06:11)

## 2019-08-13 ENCOUNTER — Other Ambulatory Visit: Payer: Self-pay | Admitting: Adult Health

## 2019-08-13 LAB — CBC WITH DIFFERENTIAL/PLATELET
Absolute Monocytes: 688 cells/uL (ref 200–950)
Basophils Absolute: 56 cells/uL (ref 0–200)
Basophils Relative: 0.6 %
Eosinophils Absolute: 130 cells/uL (ref 15–500)
Eosinophils Relative: 1.4 %
HCT: 42.9 % (ref 35.0–45.0)
Hemoglobin: 14.2 g/dL (ref 11.7–15.5)
Lymphs Abs: 1934 cells/uL (ref 850–3900)
MCH: 30.6 pg (ref 27.0–33.0)
MCHC: 33.1 g/dL (ref 32.0–36.0)
MCV: 92.5 fL (ref 80.0–100.0)
MPV: 10.4 fL (ref 7.5–12.5)
Monocytes Relative: 7.4 %
Neutro Abs: 6491 cells/uL (ref 1500–7800)
Neutrophils Relative %: 69.8 %
Platelets: 330 10*3/uL (ref 140–400)
RBC: 4.64 10*6/uL (ref 3.80–5.10)
RDW: 12.6 % (ref 11.0–15.0)
Total Lymphocyte: 20.8 %
WBC: 9.3 10*3/uL (ref 3.8–10.8)

## 2019-08-13 LAB — COMPLETE METABOLIC PANEL WITH GFR
AG Ratio: 2 (calc) (ref 1.0–2.5)
ALT: 22 U/L (ref 6–29)
AST: 22 U/L (ref 10–35)
Albumin: 4.3 g/dL (ref 3.6–5.1)
Alkaline phosphatase (APISO): 50 U/L (ref 37–153)
BUN/Creatinine Ratio: 17 (calc) (ref 6–22)
BUN: 16 mg/dL (ref 7–25)
CO2: 28 mmol/L (ref 20–32)
Calcium: 9.4 mg/dL (ref 8.6–10.4)
Chloride: 107 mmol/L (ref 98–110)
Creat: 0.95 mg/dL — ABNORMAL HIGH (ref 0.60–0.93)
GFR, Est African American: 69 mL/min/{1.73_m2} (ref >=60)
GFR, Est Non African American: 60 mL/min/{1.73_m2} (ref >=60)
Globulin: 2.1 g/dL (calc) (ref 1.9–3.7)
Glucose, Bld: 95 mg/dL (ref 65–99)
Potassium: 4.3 mmol/L (ref 3.5–5.3)
Sodium: 145 mmol/L (ref 135–146)
Total Bilirubin: 0.4 mg/dL (ref 0.2–1.2)
Total Protein: 6.4 g/dL (ref 6.1–8.1)

## 2019-08-13 LAB — LIPID PANEL
Cholesterol: 155 mg/dL (ref <200)
HDL: 57 mg/dL (ref >=50)
LDL Cholesterol (Calc): 80 mg/dL (calc)
Non-HDL Cholesterol (Calc): 98 mg/dL (calc) (ref <130)
Total CHOL/HDL Ratio: 2.7 (calc) (ref <5.0)
Triglycerides: 94 mg/dL (ref <150)

## 2019-08-13 LAB — TSH: TSH: 1.61 mIU/L (ref 0.40–4.50)

## 2019-08-19 ENCOUNTER — Other Ambulatory Visit: Payer: Self-pay

## 2019-08-19 ENCOUNTER — Encounter: Payer: Self-pay | Admitting: Specialist

## 2019-08-19 ENCOUNTER — Ambulatory Visit (INDEPENDENT_AMBULATORY_CARE_PROVIDER_SITE_OTHER): Payer: Medicare Other | Admitting: Specialist

## 2019-08-19 DIAGNOSIS — M4807 Spinal stenosis, lumbosacral region: Secondary | ICD-10-CM

## 2019-08-19 DIAGNOSIS — M4316 Spondylolisthesis, lumbar region: Secondary | ICD-10-CM | POA: Diagnosis not present

## 2019-08-19 MED ORDER — TIZANIDINE HCL 2 MG PO CAPS: 2.0000 mg | ORAL_CAPSULE | Freq: Three times a day (TID) | ORAL | 0 refills | Status: DC | PRN

## 2019-08-19 NOTE — Patient Instructions (Signed)
Plan: Repeat MRI lumbar spine due to ineffective left L3 ESI.  Avoid bending, stooping and avoid lifting weights greater than 10 lbs. Avoid prolong standing and walking. Avoid frequent bending and stooping  No lifting greater than 10 lbs. May use ice or moist heat for pain. Weight loss is of benefit. Handicap license is approved. Tizanidine 2 mg up to TID for muscular spasm.

## 2019-08-19 NOTE — Progress Notes (Signed)
Office Visit Note   Patient: Rebekah Hamilton           Date of Birth: 1946-06-21           MRN: [id number] Visit Date: 08/19/2019              Requested by: Unk Pinto, [address] [place],  [place] [address] PCP: Unk Pinto, MD   Assessment & Plan: Visit Diagnoses:  1. Spinal stenosis of lumbosacral region   2. Spondylolisthesis of lumbar region     Plan: Repeat MRI lumbar spine due to ineffective left L3 ESI.  Avoid bending, stooping and avoid lifting weights greater than 10 lbs. Avoid prolong standing and walking. Avoid frequent bending and stooping  No lifting greater than 10 lbs. May use ice or moist heat for pain. Weight loss is of benefit. Handicap license is approved. Tizanidine 2 mg up to TID for muscular spasm.  Hemp CBD capsules, [email or web address] 5,000-7,000 mg per bottle, 60 capsules per bottle, take one capsule twice a day.  Follow-Up Instructions: No follow-ups on file.  Follow-Up Instructions: No follow-ups on file.   Orders:  No orders of the defined types were placed in this encounter.  No orders of the defined types were placed in this encounter.     Procedures: No procedures performed   Clinical Data: Findings:  CLINICAL DATA: Low back pain radiating into the left thigh with numbness and tingling. No known injury. Symptoms are chronic.  EXAM: MRI LUMBAR SPINE WITHOUT CONTRAST  TECHNIQUE: Multiplanar, multisequence MR imaging of the lumbar spine was performed. No intravenous contrast was administered.  COMPARISON: MRI lumbar spine 02/11/2017.  FINDINGS: Segmentation: Standard.  Alignment: Severe convex right thoracolumbar scoliosis with the apex at L1-2 is unchanged. No listhesis.  Vertebrae: No fracture or worrisome lesion. A few small hemangiomas are seen. Degenerative endplate signal change eccentric to the left L1-2 and L2-3 is noted.  Conus medullaris and cauda equina: Conus extends to the  L1-2 level. Conus and cauda equina appear normal.  Paraspinal and other soft tissues: T2 hyperintense lesions in the right kidney most consistent with cysts are unchanged.  Disc levels:  T10-11 is imaged in the sagittal plane only. Disc bulge eccentric to the right and bilateral facet arthropathy are seen. The ventral thecal sac appears effaced. The left foramen is open. Moderate right foraminal narrowing is noted.  T11-12: Moderate bilateral facet degenerative disease. Shallow disc bulge to the left. The central canal and foramina appear open.  T12-L1: Central and eccentric left disc protrusion indents the ventral thecal sac but the central canal and foramina appear open.  L1-2: Disc bulge toward the left and mild-to-moderate facet degenerative change, worse on the left. Mild narrowing in the left lateral recess is identified. The foramina are open. The appearance is unchanged.  L2-3: Moderate to advanced facet degenerative change is worse on the left. Shallow disc bulge to the left is seen and there is some ligamentum flavum thickening. Moderate to moderately severe central canal stenosis is worse toward the left and in particular in the left lateral recess. The foramina are open. The appearance is unchanged.  L3-4: Advanced facet degenerative change, bulky ligamentum flavum thickening and a broad-based central protrusion cause moderately severe to severe central canal and bilateral lateral recess stenosis. The right foramen is open. Moderately severe left foraminal narrowing is present. The appearance is unchanged.  L4-5: Moderate bilateral facet arthropathy appears worse on the right. There is ligamentum flavum thickening,  worse on the right. Shallow broad-based right paracentral protrusion causes moderate to moderately severe central canal stenosis and narrowing in the right lateral recess. The foramina appear open.  L5-S1: Broad-based disc bulge and endplate spur  eccentrically prominent to the right. There is some ligamentum flavum thickening and facet arthropathy. Moderately severe central canal stenosis is present and there is narrowing in the subarticular recesses, worse on the right. The left foramen is open. Severe right foraminal narrowing is present.  IMPRESSION: No change in the appearance of the lumbar spine since the most recent examination.  Moderate to moderately severe central canal stenosis at L2-3 is worse toward the left and in the left lateral recess.  Moderately severe left foraminal narrowing and moderately severe to severe central canal and bilateral lateral recess stenosis at L3-4.  Moderate to moderately severe central canal stenosis and narrowing in the right lateral recess at L4-5.  Right worse than left subarticular recess narrowing at L5-S1. Severe right foraminal narrowing is also present at this level.  Severe convex right thoracolumbar scoliosis.   Electronically Signed By: Inge Rise M.D. On: 10/19/2017 10:22    Subjective: Chief Complaint  Patient presents with  . Lower Back - Follow-up    L3 nerve root block and transforaminal injection at [facility] on 06/04/2019. Did not help as much as the first injection she received.     74 year old female with lumbar spinal stenosis with recent ESI transforamenal ESI L3 nerve root. The only difference was that previous  Injections did hurt and this injection did not cause as much pain.    Review of Systems  Constitutional: Negative.   HENT: Positive for congestion and sinus pressure.   Eyes: Negative.   Respiratory: Negative.  Negative for apnea, cough, choking, chest tightness, shortness of breath and wheezing.   Cardiovascular: Negative.  Negative for chest pain, palpitations and leg swelling.  Gastrointestinal: Negative.  Negative for abdominal distention, abdominal pain, anal bleeding, blood in stool, constipation, diarrhea, nausea, rectal  pain and vomiting.  Endocrine: Negative.  Negative for cold intolerance, heat intolerance, polydipsia, polyphagia and polyuria.  Genitourinary: Negative.   Musculoskeletal: Negative.  Negative for arthralgias, back pain and gait problem.  Skin: Negative.  Negative for color change, pallor, rash and wound.  Allergic/Immunologic: Negative.  Negative for environmental allergies, food allergies and immunocompromised state.  Neurological: Positive for weakness. Negative for dizziness, tremors, seizures, syncope, facial asymmetry, speech difficulty, light-headedness, numbness and headaches.  Hematological: Negative.  Negative for adenopathy. Does not bruise/bleed easily.  Psychiatric/Behavioral: Negative.  Negative for agitation, behavioral problems, confusion, decreased concentration, dysphoric mood, hallucinations, self-injury, sleep disturbance and suicidal ideas. The patient is not nervous/anxious and is not hyperactive.      Objective: Vital Signs: BP (!) 150/78 (BP Location: Left Arm, Patient Position: Sitting)   Pulse 73   Ht 5\' 2"  (1.575 m)   Wt 154 lb (69.9 kg)   BMI 28.17 kg/m   Physical Exam Constitutional:      Appearance: She is well-developed.  HENT:     Head: Normocephalic and atraumatic.  Eyes:     Pupils: Pupils are equal, round, and reactive to light.  Pulmonary:     Effort: Pulmonary effort is normal.     Breath sounds: Normal breath sounds.  Abdominal:     General: Bowel sounds are normal.     Palpations: Abdomen is soft.  Musculoskeletal:     Cervical back: Normal range of motion and neck supple.  Lumbar back: Positive right straight leg raise test and positive left straight leg raise test.  Skin:    General: Skin is warm and dry.  Neurological:     Mental Status: She is alert and oriented to person, place, and time.  Psychiatric:        Behavior: Behavior normal.        Thought Content: Thought content normal.        Judgment: Judgment normal.      Back Exam   Tenderness  The patient is experiencing tenderness in the lumbar.  Range of Motion  Extension: abnormal  Flexion: normal  Lateral bend right: abnormal  Lateral bend left: abnormal  Rotation right: abnormal  Rotation left: abnormal   Muscle Strength  Right Quadriceps:  5/5  Left Quadriceps:  4/5  Right Hamstrings:  5/5  Left Hamstrings:  5/5   Tests  Straight leg raise right: positive Straight leg raise left: positive  Reflexes  Patellar: 0/4 Achilles: 0/4 Babinski's sign: normal   Other  Toe walk: normal Heel walk: normal Sensation: normal Gait: normal  Erythema: no back redness Scars: absent      Specialty Comments:  No specialty comments available.  Imaging: No results found.   PMFS History: Patient Active Problem List   Diagnosis Date Noted  . Chronic venous insufficiency 05/07/2019  . History of 2019 novel coronavirus disease (COVID-19) 05/07/2019  . Chronic left-sided low back pain with left-sided sciatica 07/02/2018  . OAB (overactive bladder) 04/02/2018  . CKD (chronic kidney disease), symptom management only, stage 2 (mild) 09/24/2017  . Encounter for Medicare annual wellness exam 06/02/2015  . Glaucoma 06/02/2015  . Vitamin D deficiency 10/21/2014  . Medication management 12/01/2013  . Hyperlipidemia, mixed   . Essential hypertension   . Abnormal glucose   . Migraines   . Allergy   . Anxiety   . Depression, major, recurrent, in remission ([place])   . Lumbar stenosis    Past Medical History:  Diagnosis Date  . Allergy   . Anemia   . Anxiety   . Depression   . Hyperlipidemia   . Hypertension   . Lumbar stenosis   . Migraines   . Prediabetes     Family History  Problem Relation Age of Onset  . Stroke Mother   . Hypertension Mother   . Heart attack Father 18  . Other Maternal Grandmother        blood clot after surgery  . Heart attack Paternal Grandmother     Past Surgical History:  Procedure Laterality  Date  . BREAST LUMPECTOMY Bilateral   . CHOLECYSTECTOMY  1999   Social History   Occupational History  . Not on file  Tobacco Use  . Smoking status: Never Smoker  . Smokeless tobacco: Never Used  Substance and Sexual Activity  . Alcohol use: Yes    Alcohol/week: 2.0 standard drinks    Types: 2 Glasses of wine per week  . Drug use: No  . Sexual activity: Not Currently

## 2019-08-22 ENCOUNTER — Other Ambulatory Visit: Payer: Self-pay | Admitting: Specialist

## 2019-08-25 ENCOUNTER — Other Ambulatory Visit: Payer: Medicare Other

## 2019-08-27 ENCOUNTER — Other Ambulatory Visit: Payer: Medicare Other

## 2019-08-29 NOTE — Progress Notes (Signed)
Assessment and Plan:  Rebekah Hamilton was seen today for follow-up.  Diagnoses and all orders for this visit:  Moderate episode of recurrent major depressive disorder ([place]) Anxiety Patient reports fairly managed by current regimen; continue medications Stress management techniques discussed, increase water, good sleep hygiene discussed, increase exercise, and increase veggies.   Decreased urine output ? Acute urinary retention vs prerenal etiology vs insufficient intake nonpalpable bladder, appears euvolemic, non-edematous, non-distended abd- benign exam Check labs for infection, renal functions  Consider KUB ultrasound Consider renal artery ultrasound  Will send in flomax after discussion with Dr. Melford Aase for her to take tonight Advised keep a close log of fluid input and urine output- hat given to measure The patient was advised to call immediately if she has any concerning symptoms in the interval. The patient voices understanding of current treatment options and is in agreement with the current care plan.The patient knows to call the clinic with any problems, questions or concerns or go to the ER if any further progression of symptoms, if no urine output overnight   -     CBC with Differential/Platelet -     COMPLETE METABOLIC PANEL WITH GFR -     Urinalysis w microscopic + reflex cultur  Medication management -     CBC with Differential/Platelet -     COMPLETE METABOLIC PANEL WITH GFR -     Urinalysis w microscopic + reflex cultur  Hypertension Recent hx of labile BPs trending up;  Will pursue evaluation for decreased urine output as per above; monitor BPs closely  Will avoid initiating medication until clarified Sharren Bridge will pursue renal artery ultrasound tomorrow pending labs  Monitor blood pressure at home; call if consistently over 130/80 despite resolution of above sx Continue DASH diet.   Reminder to go to the ER if any CP, SOB, nausea, dizziness, severe HA, changes vision/speech,  left arm numbness and tingling and jaw pain.   Further disposition pending results of labs. Discussed med's effects and SE's.   Over 30 minutes of exam, counseling, chart review, and critical decision making was performed.   Future Appointments  Date Time Provider Esto  09/11/2019  8:30 AM Jessy Oto, MD [facility] None  11/11/2019  2:00 PM Liane Comber, NP [facility] None  06/04/2020  2:00 PM Unk Pinto, MD [facility] None    ------------------------------------------------------------------------------------------------------------------   HPI BP (!) 170/88   Pulse 64   Temp (!) 97.5 F (36.4 C)   Wt 149 lb 12.8 oz (67.9 kg)   SpO2 96%   BMI 27.40 kg/m   74 y.o.female presents for 4 week follow up on anxiety/recurrent major depression after med adjustment.    she has a diagnosis of recurrent major depression/anxiety related to her divorce which is dragging out now for several years, then husband passed away unexpectedly, family trouble with step kids. She reports severe anxiety and stress, feels like she can't think. Was started on Wellbutrin and trazodone in addition to cymbalta but stopped as didn't feel this helped/felt "out of it." xanax PRN, . she reports using xanax sparingly; will take 1/2 tab only; none in a few weeks; hasn't refilled since 2019.  After discussion of options, buspar was initiated, she is taking just PRN, taking 1/2 tab as needed 3-4 days a week and does note benefit with this during the day. She reports she is doing fairly overall.   She reports today 2 weeks of decreased urinary output, no accompaniments, noted that was only going to the  bathroom a few times a day, just a small amount, not getting up at night to urinate as she normally would. She reports she took an Azo and this seemed to help and urinated a large amount. She reports barely urinated again yesterday, only some mild dribbling today. She does endorse lower abdominal  pressure. Estimates about 1/2 cup output today.   She does have remote hx of UTI, no hx of renal stones. Denies recent hematuria, clots. Denies flank pain, fever/chills, dysuria; does have urge to urinate but not necessarily urgency. Denies frequency.,    She is following with Dr. Louanne Skye for lumbar/left hip pain and pending MRI; denies extramity radicular pain.   She is not on BP medication, doesn't check BP at home though does have access to cuff, today their BP is BP: (!) 170/88 similar by manual recheck by provider.  She denies chest pain, shortness of breath, dizziness.  Wt Readings from Last 3 Encounters:  09/02/19 149 lb 12.8 oz (67.9 kg)  08/19/19 154 lb (69.9 kg)  08/12/19 149 lb (67.6 kg)     Past Medical History:  Diagnosis Date  . Allergy   . Anemia   . Anxiety   . Depression   . Hyperlipidemia   . Hypertension   . Lumbar stenosis   . Migraines   . Prediabetes      Allergies  Allergen Reactions  . Augmentin [Amoxicillin-Pot Clavulanate] Diarrhea  . Doxycycline Nausea Only  . Egg White (Diagnostic) Nausea Only  . Prozac [Fluoxetine Hcl] Other (See Comments)    Disoriented    Current Outpatient Medications on File Prior to Visit  Medication Sig  . ALPRAZolam (XANAX) 0.25 MG tablet Take 1/2 to 1 tablet 2 to 3 x / day ONLY if needed for Acute Anxiety Attacks  & please try to limit to 5 days / week to avoid addiction  . Ascorbic Acid (VITAMIN C PO) 500 mg 2 (two) times daily.  Marland Kitchen aspirin 325 MG tablet Take 325 mg by mouth daily.  . busPIRone (BUSPAR) 10 MG tablet Take 1/2-1 tab three times a day daily for anxiety and mood.  . Cholecalciferol (VITAMIN D PO) Take by mouth. Takes 10000 to 12000 units daily.  . clotrimazole-betamethasone (LOTRISONE) cream Apply 1 application topically 2 (two) times daily. (Patient taking differently: Apply 1 application topically as needed. )  . diclofenac sodium (VOLTAREN) 1 % GEL Apply 2 g topically 4 (four) times daily.  . DULoxetine  (CYMBALTA) 60 MG capsule TAKE 1 CAPSULE BY MOUTH EVERY DAY  . estradiol (ESTRACE) 0.5 MG tablet TAKE 1 TABLET BY MOUTH EVERY DAY  . fexofenadine (ALLEGRA) 180 MG tablet Take 180 mg by mouth daily.  . pravastatin (PRAVACHOL) 40 MG tablet Take 1 tablet at Bedtime for Cholesterol  . progesterone (PROMETRIUM) 100 MG capsule TAKE ONE CAPSULE BY MOUTH DAILY  . rizatriptan (MAXALT) 10 MG tablet Take 1 tablet for Migraine & may repeat 1 x in 2 hrs - ((Maximum 2 tablets  /24 hours))  . tizanidine (ZANAFLEX) 2 MG capsule Take 1 capsule (2 mg total) by mouth 3 (three) times daily as needed for muscle spasms.  Marland Kitchen zinc gluconate 50 MG tablet Take 50 mg by mouth daily.   No current facility-administered medications on file prior to visit.    ROS: all negative except above.   Physical Exam:  BP (!) 170/88   Pulse 64   Temp (!) 97.5 F (36.4 C)   Wt 149 lb 12.8 oz (67.9 kg)  SpO2 96%   BMI 27.40 kg/m   General Appearance: Well nourished, in no apparent distress. Eyes: PERRLA, conjunctiva no swelling or erythema ENT/Mouth: Mask in place; Hearing normal.  Neck: Supple, thyroid normal.  Respiratory: Respiratory effort normal, BS equal bilaterally without rales, rhonchi, wheezing or stridor.  Cardio: RRR with no MRGs. Brisk peripheral pulses without edema.  Abdomen: Soft, + BS.  Mild vague generalized lower abdominal tenderness, bladder non-palpable, no guarding, rebound, hernias, palpable masses. Lymphatics: Non tender without lymphadenopathy.  Musculoskeletal: Full ROM, 5/5 strength, normal gait. She has mild kyphosis with scoliosis  Skin: Warm, dry without rashes, lesions, ecchymosis.  Neuro: Cranial nerves intact. Normal muscle tone, no cerebellar symptoms. Sensation intact.  Psych: Awake and oriented X 3, normal affect, Insight and Judgment appropriate.     Izora Ribas, NP 4:47 PM [facility]

## 2019-09-01 ENCOUNTER — Ambulatory Visit
Admission: RE | Admit: 2019-09-01 | Discharge: 2019-09-01 | Disposition: A | Discharge status: Home / Self Care | Payer: Medicare Other | Source: Ambulatory Visit | Attending: Specialist | Admitting: Specialist

## 2019-09-01 ENCOUNTER — Other Ambulatory Visit: Payer: Self-pay

## 2019-09-01 DIAGNOSIS — M4316 Spondylolisthesis, lumbar region: Secondary | ICD-10-CM

## 2019-09-01 DIAGNOSIS — M48061 Spinal stenosis, lumbar region without neurogenic claudication: Secondary | ICD-10-CM | POA: Diagnosis not present

## 2019-09-01 DIAGNOSIS — M4807 Spinal stenosis, lumbosacral region: Secondary | ICD-10-CM

## 2019-09-02 ENCOUNTER — Other Ambulatory Visit: Payer: Self-pay

## 2019-09-02 ENCOUNTER — Ambulatory Visit (INDEPENDENT_AMBULATORY_CARE_PROVIDER_SITE_OTHER): Payer: Medicare Other | Admitting: Adult Health

## 2019-09-02 ENCOUNTER — Other Ambulatory Visit: Payer: Self-pay | Admitting: Adult Health

## 2019-09-02 ENCOUNTER — Encounter: Payer: Self-pay | Admitting: Adult Health

## 2019-09-02 VITALS — BP 168/92 | HR 64 | Temp 97.5°F | Wt 149.8 lb

## 2019-09-02 DIAGNOSIS — F419 Anxiety disorder, unspecified: Secondary | ICD-10-CM | POA: Diagnosis not present

## 2019-09-02 DIAGNOSIS — R34 Anuria and oliguria: Secondary | ICD-10-CM

## 2019-09-02 DIAGNOSIS — F331 Major depressive disorder, recurrent, moderate: Secondary | ICD-10-CM | POA: Diagnosis not present

## 2019-09-02 DIAGNOSIS — Z79899 Other long term (current) drug therapy: Secondary | ICD-10-CM | POA: Diagnosis not present

## 2019-09-02 DIAGNOSIS — I10 Essential (primary) hypertension: Secondary | ICD-10-CM | POA: Diagnosis not present

## 2019-09-02 MED ORDER — TAMSULOSIN HCL 0.4 MG PO CAPS: ORAL_CAPSULE | ORAL | 0 refills | Status: DC

## 2019-09-02 NOTE — Patient Instructions (Addendum)
Please to the to ER if you stop passing urine, develop fever/chills, severe abdominal/flank pain, any sudden concerning symptoms tonight  We will likely plan to get some ultrasounds in the morning    Acute Urinary Retention, Female  Acute urinary retention means that you cannot pee (urinate) at all, or that you pee too little and your bladder is not emptied completely. If it is not treated, it can lead to kidney damage or other serious problems. Follow these instructions at home:  Take over-the-counter and prescription medicines only as told by your doctor. Ask your doctor what medicines you should stay away from. Do not take any medicine unless your doctor says it is okay to do so.  If you were sent home with a tube that drains pee from the bladder (catheter), take care of it as told by your doctor.  Drink enough fluid to keep your pee clear or pale yellow.  If you were given an antibiotic, take it as told by your doctor. Do not stop taking the antibiotic even if you start to feel better.  Do not use any products that contain nicotine or tobacco, such as cigarettes and e-cigarettes. If you need help quitting, ask your doctor.  Watch for changes in your symptoms. Tell your doctor about them.  If told, keep track of any changes in your blood pressure at home. Tell your doctor about them.  Keep all follow-up visits as told by your doctor. This is important. Contact a doctor if:  You have spasms or you leak pee when you have spasms. Get help right away if:  You have chills or a fever.  You have blood in your pee.  You have a tube that drains the bladder and: ? The tube stops draining pee. ? The tube falls out. Summary  Acute urinary retention means that you cannot pee at all, or that you pee too little and your bladder is not emptied completely. If it is not treated, it can result in kidney damage or other serious problems.  If you were sent home with a tube that drains pee  from the bladder, take care of it as told by your doctor.  Pay attention to any changes in your symptoms. Tell your doctor about them. This information is not intended to replace advice given to you by your health care provider. Make sure you discuss any questions you have with your health care provider. Document Revised: 07/14/2017 Document Reviewed: 09/02/2016 Elsevier Patient Education  [place].       Renal Artery Stenosis Renal artery stenosis (RAS) is a narrowing of the artery that carries blood to the kidneys. It can affect one or both kidneys. The kidneys filter waste and extra fluid from the blood. Waste and fluid are then removed when a person passes urine. The kidneys also make an important chemical messenger (hormone) called renin. Renin helps regulate blood pressure. The first sign of RAS may be high blood pressure. Other symptoms can develop over time. What are the causes? A common cause of this condition is plaque buildup in your arteries (atherosclerosis). The plaques that cause this are made up of:  Fat.  Cholesterol.  Calcium.  Other substances. As these substances build up in your renal artery, the blood supply to your kidneys slows. The lack of blood and oxygen causes the signs and symptoms of RAS. A much less common cause of RAS is a disease called fibromuscular dysplasia. This disease causes abnormal cell growth that narrows  the renal artery. It is not related to atherosclerosis. It occurs mostly in women who are 50-68 years old. It may be passed down through families.  What increases the risk? You are more likely to develop this condition if you:  Are a man who is at least 74 years old.  Are a woman who is at least 73 years old.  Have high blood pressure.  Have high cholesterol.  Are a smoker.  Abuse alcohol.  Have diabetes or prediabetes.  Are overweight or obese.  Have a family history of early heart disease. What are the signs or  symptoms? RAS usually develops slowly. You may not have any signs or symptoms at first. Early signs may include:  Development of high blood pressure.  A sudden increase in existing high blood pressure.  No longer responding to medicine that used to control your blood pressure. Later signs and symptoms are due to kidney damage. They may include:  Feeling tired (fatigue).  Shortness of breath.  Swollen legs and feet.  Dry skin.  Headaches.  Muscle cramps.  Loss of appetite.  Nausea or vomiting. How is this diagnosed? This condition may be diagnosed based on:  Your symptoms and medical history. Your health care provider may suspect RAS based on changes in your blood pressure and your risk factors.  A physical exam. During the exam, your health care provider will use a stethoscope to listen for a whooshing sound (bruit) that can occur where the renal artery is blocking blood flow.  Various tests. These may include: ? Blood and urine tests to check your kidney function. ? Imaging tests of your kidneys, such as:  A test that uses sound waves to create an image of your kidneys and the blood flow to your kidneys (ultrasound).  A test in which dye is injected into one of your blood vessels so images can be taken as the dye flows through your renal arteries (angiogram). This can be done using X-rays, a CT scan (computed tomography angiogram, CTA), or a type of MRI (magnetic resonance angiogram, MRA). How is this treated? Making lifestyle changes to reduce your risk factors is the first treatment option for early RAS. If the blood flow to one of your kidneys is cut by more than half, you may need medicine to:  Lower your blood pressure. This is the main medical treatment for RAS. You may need more than one type of medicine for this. The types that work best for people with RAS are: ? ACE inhibitors. ? Angiotensin receptor blockers.  Reduce fluid in the body (diuretics).  Lower  your cholesterol (statins). If medicine is not enough to control RAS, you may need surgery. This may involve:  Threading a tube with an inflatable balloon into the renal artery to force it to open (angioplasty).  Removing plaque from inside the artery (endarterectomy). Follow these instructions at home:  Lifestyle  Make any lifestyle changes recommended by your health care provider. This may include: ? Working with a dietitian to maintain a heart-healthy diet. This type of diet is low in saturated fat, salt, and added sugar. ? Starting an exercise program as directed by your health care provider. ? Maintaining a healthy weight. ? Quitting smoking. ? Not abusing alcohol. General instructions  Take over-the-counter and prescription medicines only as told by your health care provider.  Keep all follow-up visits as told by your health care provider. This is important. Contact a health care provider if:  Your symptoms of  RAS are not getting better.  Your symptoms are changing or getting worse. Get help right away if you have:  Very bad pain in your back or abdomen.  Blood in your urine. Summary  Renal artery stenosis (RAS) is a narrowing of the artery that carries blood to the kidneys. It can affect one or both kidneys.  RAS usually develops slowly. You may not have any signs or symptoms at first, but high blood pressure that is difficult to control is a key symptom.  Making lifestyle changes to reduce your risk factors is the first treatment option for early RAS. If the blood flow to one of your kidneys is cut by more than half, you may need medicines to help manage your cholesterol and blood pressure. This information is not intended to replace advice given to you by your health care provider. Make sure you discuss any questions you have with your health care provider. Document Revised: 08/28/2017 Document Reviewed: 08/28/2017 Elsevier Patient Education  2020 [employer].

## 2019-09-03 ENCOUNTER — Other Ambulatory Visit: Payer: Self-pay | Admitting: Adult Health

## 2019-09-03 DIAGNOSIS — R10819 Abdominal tenderness, unspecified site: Secondary | ICD-10-CM

## 2019-09-03 DIAGNOSIS — R34 Anuria and oliguria: Secondary | ICD-10-CM

## 2019-09-03 LAB — URINALYSIS W MICROSCOPIC + REFLEX CULTURE
Bilirubin Urine: NEGATIVE
Glucose, UA: NEGATIVE
Hgb urine dipstick: NEGATIVE
Hyaline Cast: NONE SEEN /LPF
Ketones, ur: NEGATIVE
Leukocyte Esterase: NEGATIVE
Nitrites, Initial: NEGATIVE
Protein, ur: NEGATIVE
Specific Gravity, Urine: 1.029 (ref 1.001–1.03)
pH: 5.5 (ref 5.0–8.0)

## 2019-09-03 LAB — CBC WITH DIFFERENTIAL/PLATELET
Absolute Monocytes: 782 cells/uL (ref 200–950)
Basophils Absolute: 50 cells/uL (ref 0–200)
Basophils Relative: 0.5 %
Eosinophils Absolute: 248 cells/uL (ref 15–500)
Eosinophils Relative: 2.5 %
HCT: 44 % (ref 35.0–45.0)
Hemoglobin: 14.6 g/dL (ref 11.7–15.5)
Lymphs Abs: 2871 cells/uL (ref 850–3900)
MCH: 30.4 pg (ref 27.0–33.0)
MCHC: 33.2 g/dL (ref 32.0–36.0)
MCV: 91.7 fL (ref 80.0–100.0)
MPV: 10.5 fL (ref 7.5–12.5)
Monocytes Relative: 7.9 %
Neutro Abs: 5950 cells/uL (ref 1500–7800)
Neutrophils Relative %: 60.1 %
Platelets: 345 10*3/uL (ref 140–400)
RBC: 4.8 10*6/uL (ref 3.80–5.10)
RDW: 12.6 % (ref 11.0–15.0)
Total Lymphocyte: 29 %
WBC: 9.9 10*3/uL (ref 3.8–10.8)

## 2019-09-03 LAB — COMPLETE METABOLIC PANEL WITH GFR
AG Ratio: 1.9 (calc) (ref 1.0–2.5)
ALT: 19 U/L (ref 6–29)
AST: 22 U/L (ref 10–35)
Albumin: 4.4 g/dL (ref 3.6–5.1)
Alkaline phosphatase (APISO): 61 U/L (ref 37–153)
BUN: 12 mg/dL (ref 7–25)
CO2: 28 mmol/L (ref 20–32)
Calcium: 9.6 mg/dL (ref 8.6–10.4)
Chloride: 105 mmol/L (ref 98–110)
Creat: 0.82 mg/dL (ref 0.60–0.93)
GFR, Est African American: 83 mL/min/{1.73_m2} (ref >=60)
GFR, Est Non African American: 71 mL/min/{1.73_m2} (ref >=60)
Globulin: 2.3 g/dL (calc) (ref 1.9–3.7)
Glucose, Bld: 77 mg/dL (ref 65–99)
Potassium: 3.6 mmol/L (ref 3.5–5.3)
Sodium: 142 mmol/L (ref 135–146)
Total Bilirubin: 0.5 mg/dL (ref 0.2–1.2)
Total Protein: 6.7 g/dL (ref 6.1–8.1)

## 2019-09-03 LAB — NO CULTURE INDICATED

## 2019-09-09 ENCOUNTER — Other Ambulatory Visit: Payer: Self-pay | Admitting: Adult Health

## 2019-09-09 ENCOUNTER — Ambulatory Visit
Admission: RE | Admit: 2019-09-09 | Discharge: 2019-09-09 | Disposition: A | Discharge status: Home / Self Care | Payer: Medicare Other | Source: Ambulatory Visit | Attending: Adult Health | Admitting: Adult Health

## 2019-09-09 DIAGNOSIS — R10819 Abdominal tenderness, unspecified site: Secondary | ICD-10-CM

## 2019-09-09 DIAGNOSIS — R34 Anuria and oliguria: Secondary | ICD-10-CM

## 2019-09-09 DIAGNOSIS — R102 Pelvic and perineal pain: Secondary | ICD-10-CM | POA: Diagnosis not present

## 2019-09-11 ENCOUNTER — Other Ambulatory Visit: Payer: Self-pay

## 2019-09-11 ENCOUNTER — Encounter: Payer: Self-pay | Admitting: Specialist

## 2019-09-11 ENCOUNTER — Ambulatory Visit (INDEPENDENT_AMBULATORY_CARE_PROVIDER_SITE_OTHER): Payer: Medicare Other | Admitting: Specialist

## 2019-09-11 VITALS — BP 155/86 | HR 76 | Ht 62.0 in | Wt 154.0 lb

## 2019-09-11 DIAGNOSIS — M4807 Spinal stenosis, lumbosacral region: Secondary | ICD-10-CM

## 2019-09-11 DIAGNOSIS — R339 Retention of urine, unspecified: Secondary | ICD-10-CM | POA: Diagnosis not present

## 2019-09-11 DIAGNOSIS — M4156 Other secondary scoliosis, lumbar region: Secondary | ICD-10-CM | POA: Diagnosis not present

## 2019-09-11 DIAGNOSIS — M4316 Spondylolisthesis, lumbar region: Secondary | ICD-10-CM

## 2019-09-11 DIAGNOSIS — M48061 Spinal stenosis, lumbar region without neurogenic claudication: Secondary | ICD-10-CM

## 2019-09-11 NOTE — Patient Instructions (Signed)
Avoid bending, stooping and avoid lifting weights greater than 10 lbs. Avoid prolong standing and walking. Avoid frequent bending and stooping  No lifting greater than 10 lbs. May use ice or moist heat for pain. Weight loss is of benefit. Handicap license is approved. Referral to [facility] Dr. Harl Bowie for consideration of thoracolumbar fusion and decompression lumbar area of severe  Lumbar spinal stenosis L3-4, moderate L4-5 and Right L5-S1, right T_L scoliosis. She is having urinary concerns of increased urgency and decreased frequency.

## 2019-09-11 NOTE — Progress Notes (Addendum)
Office Visit Note   Patient: Rebekah Hamilton           Date of Birth: 09/06/1945           MRN: [id number] Visit Date: 09/11/2019              Requested by: Unk Pinto, [address] [place],  [place] [address] PCP: Unk Pinto, MD   Assessment & Plan: Visit Diagnoses:  1. Spondylolisthesis of lumbar region   2. Other secondary scoliosis, lumbar region   3. Spinal stenosis of lumbosacral region   4. Spinal stenosis of lumbar region, unspecified whether neurogenic claudication present   5. Urinary retention     Plan: Avoid bending, stooping and avoid lifting weights greater than 10 lbs. Avoid prolong standing and walking. Avoid frequent bending and stooping  No lifting greater than 10 lbs. May use ice or moist heat for pain. Weight loss is of benefit. Handicap license is approved. Referral to [facility] Dr. Harl Bowie for consideration of thoracolumbar fusion and decompression lumbar area of severe  Lumbar spinal stenosis L3-4, moderate L4-5 and Right L5-S1, right T_L scoliosis. She is having urinary concerns of increased urgency and decreased frequency.   Follow-Up Instructions: Return in about 4 weeks (around 10/09/2019).   Orders:  Orders Placed This Encounter  Procedures  . Ambulatory referral to Neurosurgery   No orders of the defined types were placed in this encounter.     Procedures: No procedures performed   Clinical Data: No additional findings.   Subjective: Chief Complaint  Patient presents with  . Lower Back - Follow-up    74 year old female with recent history of increasing difficulty with urinary decreased frequency and urgency. She has been awakening with urgency and having accident before reaching the bathroom. She has some left leg tingling and numbness and  Has pain in the left buttock upper medial "dimple  Area". She has not been walking for exercise but notices difficulty with stairs due to weakness. She works  mainly Museum/gallery exhibitions officer, accounting time, has issues of former husbands estate.    Review of Systems  Constitutional: Positive for appetite change and unexpected weight change. Negative for activity change, chills, diaphoresis, fatigue and fever.  HENT: Positive for congestion, hearing loss (left ear decreased chronic) and rhinorrhea. Negative for dental problem, drooling, ear discharge, ear pain, facial swelling, mouth sores, nosebleeds, postnasal drip, sinus pressure, sneezing, sore throat, trouble swallowing and voice change.   Cardiovascular: Positive for leg swelling. Negative for chest pain and palpitations.  Gastrointestinal: Negative.  Negative for abdominal distention, abdominal pain, anal bleeding, blood in stool, constipation, diarrhea, nausea, rectal pain and vomiting.  Genitourinary: Positive for difficulty urinating, enuresis and frequency. Negative for dyspareunia, dysuria, flank pain, genital sores, hematuria, menstrual problem, pelvic pain and urgency.  Musculoskeletal: Positive for gait problem. Negative for joint swelling, myalgias, neck pain and neck stiffness.  Skin: Negative.   Allergic/Immunologic: Positive for environmental allergies. Negative for food allergies and immunocompromised state.  Neurological: Positive for weakness. Negative for dizziness, tremors, seizures, syncope, facial asymmetry, speech difficulty, light-headedness, numbness and headaches.  Hematological: Negative for adenopathy. Does not bruise/bleed easily.  Psychiatric/Behavioral: Positive for sleep disturbance.     Objective: Vital Signs: BP (!) 155/86 (BP Location: Left Arm, Patient Position: Sitting)   Pulse 76   Ht 5\' 2"  (1.575 m)   Wt 154 lb (69.9 kg)   BMI 28.17 kg/m   Physical Exam Constitutional:  Appearance: She is well-developed.  HENT:     Head: Normocephalic and atraumatic.  Eyes:     Pupils: Pupils are equal, round, and reactive to light.  Pulmonary:     Effort:  Pulmonary effort is normal.     Breath sounds: Normal breath sounds.  Abdominal:     General: Bowel sounds are normal.     Palpations: Abdomen is soft.  Musculoskeletal:        General: Normal range of motion.     Cervical back: Normal range of motion and neck supple.  Skin:    General: Skin is warm and dry.  Neurological:     Mental Status: She is alert and oriented to person, place, and time.  Psychiatric:        Behavior: Behavior normal.        Thought Content: Thought content normal.        Judgment: Judgment normal.     Ortho Exam  Specialty Comments:  No specialty comments available.  Imaging: No results found.   PMFS History: Patient Active Problem List   Diagnosis Date Noted  . Chronic venous insufficiency 05/07/2019  . History of 2019 novel coronavirus disease (COVID-19) 05/07/2019  . Chronic left-sided low back pain with left-sided sciatica 07/02/2018  . OAB (overactive bladder) 04/02/2018  . CKD (chronic kidney disease), symptom management only, stage 2 (mild) 09/24/2017  . Encounter for Medicare annual wellness exam 06/02/2015  . Glaucoma 06/02/2015  . Vitamin D deficiency 10/21/2014  . Medication management 12/01/2013  . Hyperlipidemia, mixed   . Essential hypertension   . Abnormal glucose   . Migraines   . Allergy   . Anxiety   . Depression, major, recurrent ([place])   . Lumbar stenosis    Past Medical History:  Diagnosis Date  . Allergy   . Anemia   . Anxiety   . Depression   . Hyperlipidemia   . Hypertension   . Lumbar stenosis   . Migraines   . Prediabetes     Family History  Problem Relation Age of Onset  . Stroke Mother   . Hypertension Mother   . Heart attack Father 56  . Other Maternal Grandmother        blood clot after surgery  . Heart attack Paternal Grandmother     Past Surgical History:  Procedure Laterality Date  . BREAST LUMPECTOMY Bilateral   . CHOLECYSTECTOMY  1999   Social History   Occupational History  . Not  on file  Tobacco Use  . Smoking status: Never Smoker  . Smokeless tobacco: Never Used  Substance and Sexual Activity  . Alcohol use: Yes    Alcohol/week: 2.0 standard drinks    Types: 2 Glasses of wine per week  . Drug use: No  . Sexual activity: Not Currently

## 2019-09-17 ENCOUNTER — Other Ambulatory Visit: Payer: Self-pay | Admitting: Internal Medicine

## 2019-09-17 DIAGNOSIS — I10 Essential (primary) hypertension: Secondary | ICD-10-CM

## 2019-09-17 MED ORDER — BISOPROLOL FUMARATE 5 MG PO TABS: ORAL_TABLET | ORAL | 3 refills | Status: DC

## 2019-09-29 ENCOUNTER — Other Ambulatory Visit: Payer: Self-pay | Admitting: Adult Health

## 2019-10-02 ENCOUNTER — Telehealth: Payer: Self-pay | Admitting: Specialist

## 2019-10-02 NOTE — Telephone Encounter (Signed)
CD placed at front desk, patient did not answer calls and I was unable to leave message due to no voicemail box set up.

## 2019-10-02 NOTE — Telephone Encounter (Signed)
Patient is needing her xrays on CD to take to Dr. Harl Bowie appt.on Monday. Please call when ready. [phone]. (Dr. Louanne Skye referred her so she doesn't need to sign release) Thanks!

## 2019-10-07 ENCOUNTER — Other Ambulatory Visit: Payer: Self-pay

## 2019-10-07 DIAGNOSIS — M4807 Spinal stenosis, lumbosacral region: Secondary | ICD-10-CM | POA: Diagnosis not present

## 2019-10-07 DIAGNOSIS — M47812 Spondylosis without myelopathy or radiculopathy, cervical region: Secondary | ICD-10-CM | POA: Diagnosis not present

## 2019-10-07 DIAGNOSIS — M4150 Other secondary scoliosis, site unspecified: Secondary | ICD-10-CM | POA: Insufficient documentation

## 2019-10-07 DIAGNOSIS — R339 Retention of urine, unspecified: Secondary | ICD-10-CM | POA: Diagnosis not present

## 2019-10-07 DIAGNOSIS — M8588 Other specified disorders of bone density and structure, other site: Secondary | ICD-10-CM | POA: Diagnosis not present

## 2019-10-07 DIAGNOSIS — Z9889 Other specified postprocedural states: Secondary | ICD-10-CM | POA: Diagnosis not present

## 2019-10-07 DIAGNOSIS — M4156 Other secondary scoliosis, lumbar region: Secondary | ICD-10-CM | POA: Diagnosis not present

## 2019-10-07 DIAGNOSIS — M5134 Other intervertebral disc degeneration, thoracic region: Secondary | ICD-10-CM | POA: Diagnosis not present

## 2019-10-07 DIAGNOSIS — M5117 Intervertebral disc disorders with radiculopathy, lumbosacral region: Secondary | ICD-10-CM | POA: Diagnosis not present

## 2019-10-07 DIAGNOSIS — M48061 Spinal stenosis, lumbar region without neurogenic claudication: Secondary | ICD-10-CM | POA: Diagnosis not present

## 2019-10-07 DIAGNOSIS — M4186 Other forms of scoliosis, lumbar region: Secondary | ICD-10-CM | POA: Diagnosis not present

## 2019-10-07 DIAGNOSIS — M4125 Other idiopathic scoliosis, thoracolumbar region: Secondary | ICD-10-CM | POA: Diagnosis not present

## 2019-10-07 DIAGNOSIS — M4316 Spondylolisthesis, lumbar region: Secondary | ICD-10-CM | POA: Diagnosis not present

## 2019-10-07 DIAGNOSIS — M4726 Other spondylosis with radiculopathy, lumbar region: Secondary | ICD-10-CM | POA: Diagnosis not present

## 2019-10-07 DIAGNOSIS — M4180 Other forms of scoliosis, site unspecified: Secondary | ICD-10-CM | POA: Diagnosis not present

## 2019-10-07 MED ORDER — DULOXETINE HCL 60 MG PO CPEP: ORAL_CAPSULE | ORAL | 1 refills | Status: DC

## 2019-10-28 DIAGNOSIS — Z1231 Encounter for screening mammogram for malignant neoplasm of breast: Secondary | ICD-10-CM | POA: Diagnosis not present

## 2019-10-28 LAB — HM MAMMOGRAPHY

## 2019-10-30 DIAGNOSIS — R29898 Other symptoms and signs involving the musculoskeletal system: Secondary | ICD-10-CM | POA: Diagnosis not present

## 2019-10-30 DIAGNOSIS — R200 Anesthesia of skin: Secondary | ICD-10-CM | POA: Diagnosis not present

## 2019-10-30 DIAGNOSIS — M48061 Spinal stenosis, lumbar region without neurogenic claudication: Secondary | ICD-10-CM | POA: Diagnosis not present

## 2019-10-30 DIAGNOSIS — M4187 Other forms of scoliosis, lumbosacral region: Secondary | ICD-10-CM | POA: Diagnosis not present

## 2019-10-30 DIAGNOSIS — M4186 Other forms of scoliosis, lumbar region: Secondary | ICD-10-CM | POA: Diagnosis not present

## 2019-10-30 DIAGNOSIS — M545 Low back pain: Secondary | ICD-10-CM | POA: Diagnosis not present

## 2019-10-30 DIAGNOSIS — M5416 Radiculopathy, lumbar region: Secondary | ICD-10-CM | POA: Diagnosis not present

## 2019-10-30 DIAGNOSIS — M79652 Pain in left thigh: Secondary | ICD-10-CM | POA: Diagnosis not present

## 2019-11-05 ENCOUNTER — Encounter: Payer: Self-pay | Admitting: Specialist

## 2019-11-05 ENCOUNTER — Ambulatory Visit (INDEPENDENT_AMBULATORY_CARE_PROVIDER_SITE_OTHER): Payer: Medicare Other

## 2019-11-05 ENCOUNTER — Ambulatory Visit (INDEPENDENT_AMBULATORY_CARE_PROVIDER_SITE_OTHER): Payer: Medicare Other | Admitting: Specialist

## 2019-11-05 ENCOUNTER — Encounter: Payer: Self-pay | Admitting: *Deleted

## 2019-11-05 ENCOUNTER — Other Ambulatory Visit: Payer: Self-pay

## 2019-11-05 VITALS — BP 135/73 | HR 53 | Ht 62.0 in | Wt 154.0 lb

## 2019-11-05 DIAGNOSIS — M4316 Spondylolisthesis, lumbar region: Secondary | ICD-10-CM | POA: Diagnosis not present

## 2019-11-05 DIAGNOSIS — M48061 Spinal stenosis, lumbar region without neurogenic claudication: Secondary | ICD-10-CM

## 2019-11-05 DIAGNOSIS — M5416 Radiculopathy, lumbar region: Secondary | ICD-10-CM | POA: Diagnosis not present

## 2019-11-05 DIAGNOSIS — M5442 Lumbago with sciatica, left side: Secondary | ICD-10-CM

## 2019-11-05 DIAGNOSIS — G8929 Other chronic pain: Secondary | ICD-10-CM

## 2019-11-05 DIAGNOSIS — M4156 Other secondary scoliosis, lumbar region: Secondary | ICD-10-CM | POA: Diagnosis not present

## 2019-11-05 MED ORDER — HYDROCODONE-ACETAMINOPHEN 5-325 MG PO TABS: 1.0000 | ORAL_TABLET | Freq: Four times a day (QID) | ORAL | 0 refills | Status: DC | PRN

## 2019-11-05 MED ORDER — METHYLPREDNISOLONE 4 MG PO TBPK: ORAL_TABLET | ORAL | 0 refills | Status: DC

## 2019-11-05 MED ORDER — PREGABALIN 50 MG PO CAPS: 50.0000 mg | ORAL_CAPSULE | Freq: Every day | ORAL | 0 refills | Status: DC

## 2019-11-05 NOTE — Progress Notes (Signed)
Office Visit Note   Patient: Rebekah Hamilton           Date of Birth: December 09, 1945           MRN: [id number] Visit Date: 11/05/2019              Requested by: Unk Pinto, [address] [place],  [place] [address] PCP: Unk Pinto, MD   Assessment & Plan: Visit Diagnoses:  1. Spondylolisthesis of lumbar region   2. Lumbar radiculopathy   3. Spinal stenosis of lumbar region, unspecified whether neurogenic claudication present   4. Other secondary scoliosis, lumbar region   5. Chronic left-sided low back pain with left-sided sciatica     Plan: Avoid bending, stooping and avoid lifting weights greater than 10 lbs. Avoid prolong standing and walking. Avoid frequent bending and stooping  No lifting greater than 10 lbs. May use ice or moist heat for pain. Weight loss is of benefit. Handicap license is approved. Medrol dose pak Left T12 L1 SNR block vs ESI will be scheduled via interventional radiology. Hydrocodone for pain. Lyrica for nerve pain, do not take Lyrica with gabapentin.  Follow-Up Instructions: No follow-ups on file.   Orders:  Orders Placed This Encounter  Procedures  . XR Lumbar Spine 2-3 Views  . Ambulatory referral to Interventional Radiology   Meds ordered this encounter  Medications  . methylPREDNISolone (MEDROL DOSEPAK) 4 MG TBPK tablet    Sig: Take as indicated.    Dispense:  21 tablet    Refill:  0  . pregabalin (LYRICA) 50 MG capsule    Sig: Take 1 capsule (50 mg total) by mouth daily.    Dispense:  10 capsule    Refill:  0  . HYDROcodone-acetaminophen (NORCO/VICODIN) 5-325 MG tablet    Sig: Take 1 tablet by mouth every 6 (six) hours as needed for moderate pain.    Dispense:  30 tablet    Refill:  0      Procedures: No procedures performed   Clinical Data: No additional findings.   Subjective: Chief Complaint  Patient presents with  . Lower Back - Follow-up    74 year old female with history of  thoracolumbar scoliosis with spondylosis changes and stenosis with pain with standing and walking. She has seen Dr. Harl Bowie at [facility] and he has spoke to her about her condition and the risk of requiring a long segment thoracolumbar fusion. She reports that this weekend she began having severe pain in the left inguinal area and it starts in the left buttock then radiates along the left iliac crest into the left groin. There is mainly pain and it happens with standing. She has some pain with palpation of the left lateral iliac crest and and left upper thigh laterally. Today had onset of some bladder urgency and difficulty getting to the bathroom. concerned that the urgency may make it difficult to not have accidents.   Review of Systems  Constitutional: Negative for activity change, appetite change, chills, diaphoresis, fatigue, fever and unexpected weight change.  HENT: Negative for congestion, dental problem, drooling, ear discharge, ear pain, facial swelling, hearing loss, mouth sores, nosebleeds, postnasal drip, rhinorrhea, sinus pressure, sinus pain, sneezing, sore throat, tinnitus, trouble swallowing and voice change.   Eyes: Negative.  Negative for photophobia, pain, discharge, redness, itching and visual disturbance.  Respiratory: Negative.  Negative for apnea, cough, choking, chest tightness, shortness of breath, wheezing and stridor.   Cardiovascular: Negative for chest pain,  palpitations and leg swelling.  Gastrointestinal: Negative.  Negative for abdominal distention, abdominal pain, anal bleeding, blood in stool, constipation, diarrhea, nausea, rectal pain and vomiting.  Endocrine: Negative.  Negative for cold intolerance, heat intolerance, polydipsia, polyphagia and polyuria.  Genitourinary: Positive for flank pain. Negative for decreased urine volume, difficulty urinating, dyspareunia, dysuria, enuresis, frequency, genital sores, hematuria, menstrual problem, pelvic pain, urgency, vaginal  bleeding and vaginal pain.  Musculoskeletal: Positive for back pain. Negative for arthralgias, gait problem, joint swelling, myalgias, neck pain and neck stiffness.  Skin: Negative.  Negative for color change, pallor, rash and wound.  Allergic/Immunologic: Negative.  Negative for environmental allergies, food allergies and immunocompromised state.  Neurological: Positive for weakness. Negative for dizziness, tremors, seizures, syncope, facial asymmetry, speech difficulty, light-headedness, numbness and headaches.  Hematological: Negative.  Negative for adenopathy. Does not bruise/bleed easily.  Psychiatric/Behavioral: Negative for agitation, behavioral problems, confusion, decreased concentration, dysphoric mood, hallucinations, self-injury, sleep disturbance and suicidal ideas. The patient is not nervous/anxious and is not hyperactive.      Objective: Vital Signs: BP 135/73 (BP Location: Left Arm, Patient Position: Sitting)   Pulse (!) 53   Ht 5\' 2"  (1.575 m)   Wt 154 lb (69.9 kg)   BMI 28.17 kg/m   Physical Exam Constitutional:      Appearance: She is well-developed.  HENT:     Head: Normocephalic and atraumatic.  Eyes:     Pupils: Pupils are equal, round, and reactive to light.  Pulmonary:     Effort: Pulmonary effort is normal.     Breath sounds: Normal breath sounds.  Abdominal:     General: Bowel sounds are normal.     Palpations: Abdomen is soft.  Musculoskeletal:     Cervical back: Normal range of motion and neck supple.     Lumbar back: Negative right straight leg raise test and negative left straight leg raise test.  Skin:    General: Skin is warm and dry.  Neurological:     Mental Status: She is alert and oriented to person, place, and time.  Psychiatric:        Behavior: Behavior normal.        Thought Content: Thought content normal.        Judgment: Judgment normal.     Back Exam   Tenderness  The patient is experiencing tenderness in the lumbar.  Range  of Motion  Extension: abnormal  Flexion: abnormal  Lateral bend right: abnormal  Lateral bend left: abnormal  Rotation right: abnormal  Rotation left: abnormal   Muscle Strength  Right Quadriceps:  5/5  Left Quadriceps:  5/5  Right Hamstrings:  5/5  Left Hamstrings:  5/5   Tests  Straight leg raise right: negative Straight leg raise left: negative      Specialty Comments:  No specialty comments available.  Imaging: XR Lumbar Spine 2-3 Views  Result Date: 11/05/2019 AP and lateral flexion and extension radiographs show a significant right thoracolumbar scoliosis with right lateral decompensation of the curve to the right and left rib is inside of the left iliac crest. The apex of the scoliosis is at the thoracolumbar junction on the right side and there is narrowing of the left sided neuroforamen.     PMFS History: Patient Active Problem List   Diagnosis Date Noted  . Chronic venous insufficiency 05/07/2019  . History of 2019 novel coronavirus disease (COVID-19) 05/07/2019  . Chronic left-sided low back pain with left-sided sciatica 07/02/2018  . OAB (overactive bladder)  04/02/2018  . CKD (chronic kidney disease), symptom management only, stage 2 (mild) 09/24/2017  . Encounter for Medicare annual wellness exam 06/02/2015  . Glaucoma 06/02/2015  . Vitamin D deficiency 10/21/2014  . Medication management 12/01/2013  . Hyperlipidemia, mixed   . Essential hypertension   . Abnormal glucose   . Migraines   . Allergy   . Anxiety   . Depression, major, recurrent ([place])   . Lumbar stenosis    Past Medical History:  Diagnosis Date  . Allergy   . Anemia   . Anxiety   . Depression   . Hyperlipidemia   . Hypertension   . Lumbar stenosis   . Migraines   . Prediabetes     Family History  Problem Relation Age of Onset  . Stroke Mother   . Hypertension Mother   . Heart attack Father 8  . Other Maternal Grandmother        blood clot after surgery  . Heart attack  Paternal Grandmother     Past Surgical History:  Procedure Laterality Date  . BREAST LUMPECTOMY Bilateral   . CHOLECYSTECTOMY  1999   Social History   Occupational History  . Not on file  Tobacco Use  . Smoking status: Never Smoker  . Smokeless tobacco: Never Used  Substance and Sexual Activity  . Alcohol use: Yes    Alcohol/week: 2.0 standard drinks    Types: 2 Glasses of wine per week  . Drug use: No  . Sexual activity: Not Currently

## 2019-11-05 NOTE — Patient Instructions (Addendum)
Avoid bending, stooping and avoid lifting weights greater than 10 lbs. Avoid prolong standing and walking. Avoid frequent bending and stooping  No lifting greater than 10 lbs. May use ice or moist heat for pain. Weight loss is of benefit. Handicap license is approved. Medrol dose pak Left T12 L1 SNR block vs ESI will be scheduled via interventional radiology. Hydrocodone for pain. Lyrica for nerve pain, do not take Lyrica with gabapentin.

## 2019-11-07 ENCOUNTER — Other Ambulatory Visit: Payer: Self-pay | Admitting: Specialist

## 2019-11-07 DIAGNOSIS — M4316 Spondylolisthesis, lumbar region: Secondary | ICD-10-CM

## 2019-11-08 NOTE — Progress Notes (Signed)
MEDICARE ANNUAL WELLNESS VISIT AND FOLLOW UP  Assessment:   Diagnoses and all orders for this visit:  Encounter for Medicare annual wellness exam Patient will ask GYN about ordering follow up DEXA scan  Essential hypertension Above goal, persistently elevated; add HCTZ 25 mg daily, follow up in 1 month Monitor blood pressure at home; call if consistently over 130/80 Continue DASH diet.   Reminder to go to the ER if any CP, SOB, nausea, dizziness, severe HA, changes vision/speech, left arm numbness and tingling and jaw pain.  Migraine without aura and without status migrainosus, not intractable Improved from previous - has mild HA once a week but does not progress to full migraines; continue medications Follow up neuro - [facility]  Mixed hyperlipidemia At goal; continue medications Continue low cholesterol diet and exercise.  Check lipid panel.  -     Lipid panel -     TSH  Other abnormal glucose Last A1Cs at goal  Discussed disease and risks Discussed diet/exercise, weight management  Defer A1C; check CMP  Allergic state, sequela Continue allergy medications as needed; rotate agent q 6 months, hygiene discussed  Depression, major, recurrent, active ([place]) with anxiety Improved with celexa to 60 mg; xanax PRN; Buspar is new and she feels helpful  doesn't tolerate other meds well, she will follow up with counseling which has been very helpful in the past She declines med adjustment at this time  Lifestyle discussed: diet/exerise, sleep hygiene, stress management, hydration  Spinal stenosis of lumbar region Continue follow up with ortho, pending surgery   Medication management -     CBC with Differential/Platelet -     CMP/GFR  Vitamin D deficiency At goal at recent check; continue to recommend supplementation for goal of 70-100 Defer vitamin D level  Glaucoma, unspecified glaucoma type, unspecified laterality Very mild - currently under observation Continue  follow up with ophthalmology  CKD (chronic kidney disease), symptom management only, stage 2 (mild) Increase fluids, avoid NSAIDS, monitor sugars, will monitor -     CMP WITH GFR  OAB Currently off of medications due to back with urinary retention    Over 40 minutes of exam, counseling, chart review and critical decision making was performed Future Appointments  Date Time Provider [place]  11/12/2019  1:00 PM [facility] RM 3 [id number]  06/04/2020  2:00 PM Unk Pinto, MD [facility] None     Plan:   During the course of the visit the patient was educated and counseled about appropriate screening and preventive services including:    Pneumococcal vaccine   Prevnar 13  Influenza vaccine  Td vaccine  Screening electrocardiogram  Bone densitometry screening  Colorectal cancer screening  Diabetes screening  Glaucoma screening  Nutrition counseling   Advanced directives: requested   Subjective:  Rebekah Hamilton [name] is a 74 y.o. female who presents for Medicare Annual Wellness Visit and 3 month follow up.   She is scheduled for posterior lumbar interbody fusion multi level T9-S2 on 12/23/2019 with Dr. Jenne Campus at [facility]. Daughter and mother will be coming to stay with her.   She has a diagnosis of recurrent major depression/anxiety related to her divorce which is dragging out now for several years, then husband passed away unexpectedly, having court/legal trouble with step kids, may be losing home.  Currently treated by cymbalta 60 mg daily, PRN rare xanax, buspar was added. She is taking just PRN, taking 1/2 tab as needed 3-4 days a week  and does note benefit with this during the day, resistant to taking more frequently, declines other med adjustments at this time. She reports she is not doing well but doesn't feel this will improve until legal troubles are improved, has upcoming court date. She did not do well with  wellbutrin/trazodone. She was followed by counseling but no recent follow up, encouraged her to schedule.    Followed by [facility] annually for estrogen/progesterone supplementation.   BMI is Body mass index is 27.98 kg/m., she has not been working on diet and exercise. Wt Readings from Last 3 Encounters:  11/11/19 153 lb (69.4 kg)  11/05/19 154 lb (69.9 kg)  09/11/19 154 lb (69.9 kg)    Her blood pressure has not been controlled at home (150s-170s/60s), has been running higher since her husband's passing around Thanksgiving, today their BP is BP: (!) 158/90 She does not workout. She denies chest pain, shortness of breath, dizziness.   She is on cholesterol medication (pravastatin 40 mg daily) and denies myalgias. Her cholesterol is at goal. The cholesterol last visit was:   Lab Results  Component Value Date   CHOL 155 08/12/2019   HDL 57 08/12/2019   LDLCALC 80 08/12/2019   TRIG 94 08/12/2019   CHOLHDL 2.7 08/12/2019    She has been working on diet and exercise for glucose management, and denies increased appetite, nausea, paresthesia of the feet, polydipsia, polyuria, visual disturbances and vomiting. Last A1C in the office was:  Lab Results  Component Value Date   HGBA1C 5.5 05/08/2019   Last GFR demonstrates stage 2 CKD: Lab Results  Component Value Date   GFRNONAA 71 09/02/2019   Patient is on Vitamin D supplement and at goal of 60:    Lab Results  Component Value Date   VD25OH 81 05/08/2019      Medication Review: Current Outpatient Medications on File Prior to Visit  Medication Sig Dispense Refill  . ALPRAZolam (XANAX) 0.25 MG tablet Take 1/2 to 1 tablet 2 to 3 x / day ONLY if needed for Acute Anxiety Attacks  & please try to limit to 5 days / week to avoid addiction 90 tablet 0  . Ascorbic Acid (VITAMIN C PO) 500 mg 2 (two) times daily.    Marland Kitchen aspirin 325 MG tablet Take 325 mg by mouth daily.    . bisoprolol (ZEBETA) 5 MG tablet Take 1 tablet Daily for BP 90  tablet 3  . busPIRone (BUSPAR) 10 MG tablet Take 1/2-1 tab three times a day daily for anxiety and mood. (Patient taking differently: as needed. Take 1/2-1 tab three times a day daily for anxiety and mood.) 90 tablet 1  . Cholecalciferol (VITAMIN D PO) Take by mouth. Takes 10000 to 12000 units daily.    . clotrimazole-betamethasone (LOTRISONE) cream Apply 1 application topically 2 (two) times daily. (Patient taking differently: Apply 1 application topically as needed. ) 15 g 2  . diclofenac sodium (VOLTAREN) 1 % GEL Apply 2 g topically 4 (four) times daily. 350 g 3  . DULoxetine (CYMBALTA) 60 MG capsule TAKE 1 CAPSULE BY MOUTH EVERY DAY 90 capsule 1  . estradiol (ESTRACE) 0.5 MG tablet TAKE 1 TABLET BY MOUTH EVERY DAY 90 tablet 3  . fexofenadine (ALLEGRA) 180 MG tablet Take 180 mg by mouth daily.    Marland Kitchen HYDROcodone-acetaminophen (NORCO/VICODIN) 5-325 MG tablet Take 1 tablet by mouth every 6 (six) hours as needed for moderate pain. 30 tablet 0  . pravastatin (PRAVACHOL) 40 MG tablet  Take 1 tablet at Bedtime for Cholesterol 90 tablet 3  . pregabalin (LYRICA) 50 MG capsule Take 1 capsule (50 mg total) by mouth daily. 10 capsule 0  . progesterone (PROMETRIUM) 100 MG capsule TAKE ONE CAPSULE BY MOUTH DAILY 90 capsule 3  . rizatriptan (MAXALT) 10 MG tablet Take 1 tablet for Migraine & may repeat 1 x in 2 hrs - ((Maximum 2 tablets  /24 hours)) 12 tablet 3  . tizanidine (ZANAFLEX) 2 MG capsule Take 1 capsule (2 mg total) by mouth 3 (three) times daily as needed for muscle spasms. 30 capsule 0  . zinc gluconate 50 MG tablet Take 50 mg by mouth daily.     No current facility-administered medications on file prior to visit.    Allergies  Allergen Reactions  . Augmentin [Amoxicillin-Pot Clavulanate] Diarrhea  . Doxycycline Nausea Only  . Egg White (Diagnostic) Nausea Only  . Prozac [Fluoxetine Hcl] Other (See Comments)    Disoriented    Current Problems (verified) Patient Active Problem List    Diagnosis Date Noted  . Chronic venous insufficiency 05/07/2019  . History of 2019 novel coronavirus disease (COVID-19) 05/07/2019  . Chronic left-sided low back pain with left-sided sciatica 07/02/2018  . OAB (overactive bladder) 04/02/2018  . CKD (chronic kidney disease), symptom management only, stage 2 (mild) 09/24/2017  . Encounter for Medicare annual wellness exam 06/02/2015  . Glaucoma 06/02/2015  . Vitamin D deficiency 10/21/2014  . Medication management 12/01/2013  . Hyperlipidemia, mixed   . Essential hypertension   . Abnormal glucose   . Migraines   . Allergy   . Anxiety   . Depression, major, recurrent ([place])   . Lumbar stenosis     Screening Tests Immunization History  Administered Date(s) Administered  . Influenza, High Dose Seasonal PF 05/29/2014, 06/02/2015, 06/16/2017, 05/09/2018, 05/09/2019  . Influenza,inj,quad, With Preservative 08/12/2013, 07/04/2016  . Pneumococcal Conjugate-13 04/07/2015  . Pneumococcal Polysaccharide-23 05/29/2013  . Td 08/15/2005, 07/04/2016   Prior vaccinations: TD or Tdap: 2017  Influenza: 04/2019 Pneumococcal: 2014 Prevnar13: 2016 Shingles/Zostavax: 2016 Covid 19: 2/2, 2021  Preventative care: Last colonoscopy: 2006 Cologuard: 11/2017 Last mammogram: 10/2019, via OBGYN Last pap smear/pelvic exam: 2017 negative  [place] AB 2008 MRI lumbar 01/2017, 08/2019 CXR 2008 DEXA: 2000, declines  Names of Other Physician/Practitioners you currently use: 1. [facility] here for primary care 2. Dr. Herbert Deaner, eye doctor, last visit 2020, monitoring L cataract  3. Dr. Evelene Croon, dentist, last visit 2020  Patient Care Team: Unk Pinto, MD as PCP - General (Internal Medicine) Garald Balding, MD as Consulting Physician (Orthopedic Surgery) Laurence Spates, MD (Inactive) as Consulting Physician (Gastroenterology) Avon Gully, NP as Nurse Practitioner (Obstetrics and Gynecology)  SURGICAL  HISTORY She  has a past surgical history that includes Cholecystectomy (1999) and Breast lumpectomy (Bilateral). FAMILY HISTORY Her family history includes Heart attack in her paternal grandmother; Heart attack (age of onset: 15) in her father; Hypertension in her mother; Other in her maternal grandmother; Stroke in her mother. SOCIAL HISTORY She  reports that she has never smoked. She has never used smokeless tobacco. She reports current alcohol use of about 2.0 standard drinks of alcohol per week. She reports that she does not use drugs.   MEDICARE WELLNESS OBJECTIVES: Physical activity: Current Exercise Habits: The patient does not participate in regular exercise at present, Exercise limited by: orthopedic condition(s) Cardiac risk factors: Cardiac Risk Factors include: advanced age (>57men, >52 women);dyslipidemia;hypertension;sedentary lifestyle Depression/mood screen:   Depression screen  PHQ 2/9 11/11/2019  Decreased Interest 3  Down, Depressed, Hopeless 3  PHQ - 2 Score 6  Altered sleeping 2  Tired, decreased energy 3  Change in appetite 2  Feeling bad or failure about yourself  2  Trouble concentrating 3  Moving slowly or fidgety/restless 0  Suicidal thoughts 0  PHQ-9 Score 18  Difficult doing work/chores Very difficult    ADLs:  In your present state of health, do you have any difficulty performing the following activities: 11/11/2019 06/22/2019  Hearing? N N  Vision? N N  Difficulty concentrating or making decisions? Y N  Comment overwhelmed since husband's passing. -  Walking or climbing stairs? N N  Comment climbing limited due to back pain with LLE radicular sx, pending surgical intervention -  Dressing or bathing? N N  Doing errands, shopping? N N  Some recent data might be hidden     Cognitive Testing  Alert? Yes  Normal Appearance?Yes  Oriented to person? Yes  Place? Yes   Time? Yes  Recall of three objects?  Yes  Can perform simple calculations?  Yes  Displays appropriate judgment?Yes  Can read the correct time from a watch face?Yes  EOL planning: Does Patient Have a Medical Advance Directive?: No Would patient like information on creating a medical advance directive?: Yes (MAU/Ambulatory/Procedural Areas - Information given)  Review of Systems  Constitutional: Negative for malaise/fatigue and weight loss.  HENT: Negative for hearing loss and tinnitus.   Eyes: Negative for blurred vision and double vision.  Respiratory: Negative for cough, shortness of breath and wheezing.   Cardiovascular: Negative for chest pain, palpitations, orthopnea, claudication and leg swelling.  Gastrointestinal: Negative for abdominal pain, blood in stool, constipation, diarrhea, heartburn, melena, nausea and vomiting.  Genitourinary: Negative.   Musculoskeletal: Negative for falls, joint pain and myalgias.  Skin: Negative for rash.  Neurological: Negative for dizziness, tingling, sensory change, weakness and headaches.  Endo/Heme/Allergies: Negative for polydipsia.  Psychiatric/Behavioral: Positive for depression. Negative for memory loss, substance abuse and suicidal ideas. The patient is not nervous/anxious and does not have insomnia.   All other systems reviewed and are negative.    Objective:     Today's Vitals   11/11/19 1357  BP: (!) 158/90  Pulse: (!) 51  Temp: (!) 97.3 F (36.3 C)  SpO2: 98%  Weight: 153 lb (69.4 kg)   Body mass index is 27.98 kg/m.  General appearance: alert, no distress, WD/WN, female HEENT: normocephalic, sclerae anicteric, BilTM pearly, nares patent, no discharge or erythema, pharynx normal Oral cavity: MMM, no lesions Neck: supple, no lymphadenopathy, no thyromegaly, no masses Heart: RRR, normal S1, S2, no murmurs Lungs: CTA bilaterally, no wheezes, rhonchi, or rales Abdomen: +bs, soft, non tender, non distended, no masses, no hepatomegaly, no splenomegaly Musculoskeletal: nontender, no swelling, obvious  scoliosis of spine Extremities: no edema, no cyanosis, no clubbing Pulses: 2+ symmetric, upper and lower extremities, normal cap refill Neurological: alert, oriented x 3, CN2-12 intact, strength normal upper extremities and lower extremities, decreased sensation LLE, no cerebellar signs, gait slow steady Psychiatric: normal affect, behavior normal, pleasant   Medicare Attestation I have personally reviewed: The patient's medical and social history Their use of alcohol, tobacco or illicit drugs Their current medications and supplements The patient's functional ability including ADLs,fall risks, home safety risks, cognitive, and hearing and visual impairment Diet and physical activities Evidence for depression or mood disorders  The patient's weight, height, BMI, and visual acuity have been recorded in the chart.  I have made referrals, counseling, and provided education to the patient based on review of the above and I have provided the patient with a written personalized care plan for preventive services.     Izora Ribas, NP   11/11/2019

## 2019-11-11 ENCOUNTER — Encounter: Payer: Self-pay | Admitting: Adult Health

## 2019-11-11 ENCOUNTER — Other Ambulatory Visit: Payer: Self-pay

## 2019-11-11 ENCOUNTER — Ambulatory Visit (INDEPENDENT_AMBULATORY_CARE_PROVIDER_SITE_OTHER): Payer: Medicare Other | Admitting: Adult Health

## 2019-11-11 VITALS — BP 158/90 | HR 51 | Temp 97.3°F | Wt 153.0 lb

## 2019-11-11 DIAGNOSIS — E559 Vitamin D deficiency, unspecified: Secondary | ICD-10-CM

## 2019-11-11 DIAGNOSIS — R6889 Other general symptoms and signs: Secondary | ICD-10-CM | POA: Diagnosis not present

## 2019-11-11 DIAGNOSIS — M48062 Spinal stenosis, lumbar region with neurogenic claudication: Secondary | ICD-10-CM | POA: Diagnosis not present

## 2019-11-11 DIAGNOSIS — N3281 Overactive bladder: Secondary | ICD-10-CM

## 2019-11-11 DIAGNOSIS — I872 Venous insufficiency (chronic) (peripheral): Secondary | ICD-10-CM | POA: Diagnosis not present

## 2019-11-11 DIAGNOSIS — R7309 Other abnormal glucose: Secondary | ICD-10-CM | POA: Diagnosis not present

## 2019-11-11 DIAGNOSIS — T7840XS Allergy, unspecified, sequela: Secondary | ICD-10-CM

## 2019-11-11 DIAGNOSIS — M5442 Lumbago with sciatica, left side: Secondary | ICD-10-CM

## 2019-11-11 DIAGNOSIS — N182 Chronic kidney disease, stage 2 (mild): Secondary | ICD-10-CM | POA: Diagnosis not present

## 2019-11-11 DIAGNOSIS — Z0000 Encounter for general adult medical examination without abnormal findings: Secondary | ICD-10-CM

## 2019-11-11 DIAGNOSIS — Z79899 Other long term (current) drug therapy: Secondary | ICD-10-CM | POA: Diagnosis not present

## 2019-11-11 DIAGNOSIS — I10 Essential (primary) hypertension: Secondary | ICD-10-CM | POA: Diagnosis not present

## 2019-11-11 DIAGNOSIS — F419 Anxiety disorder, unspecified: Secondary | ICD-10-CM

## 2019-11-11 DIAGNOSIS — E782 Mixed hyperlipidemia: Secondary | ICD-10-CM | POA: Diagnosis not present

## 2019-11-11 DIAGNOSIS — F331 Major depressive disorder, recurrent, moderate: Secondary | ICD-10-CM

## 2019-11-11 DIAGNOSIS — Z0001 Encounter for general adult medical examination with abnormal findings: Secondary | ICD-10-CM

## 2019-11-11 DIAGNOSIS — G43009 Migraine without aura, not intractable, without status migrainosus: Secondary | ICD-10-CM

## 2019-11-11 DIAGNOSIS — H409 Unspecified glaucoma: Secondary | ICD-10-CM

## 2019-11-11 DIAGNOSIS — G8929 Other chronic pain: Secondary | ICD-10-CM

## 2019-11-11 MED ORDER — HYDROCHLOROTHIAZIDE 25 MG PO TABS: ORAL_TABLET | ORAL | 1 refills | Status: DC

## 2019-11-11 NOTE — Patient Instructions (Addendum)
Rebekah Hamilton , Thank you for taking time to come for your Medicare Wellness Visit. I appreciate your ongoing commitment to your health goals. Please review the following plan we discussed and let me know if I can assist you in the future.   These are the goals we discussed: Goals    . Blood Pressure < 140/90    . Exercise 150 min/wk Moderate Activity       This is a list of the screening recommended for you and due dates:  Health Maintenance  Topic Date Due  . DEXA scan (bone density measurement)  02/13/2012  . Cologuard (Stool DNA test)  10/26/2020  . Mammogram  10/27/2020  . Tetanus Vaccine  07/04/2026  . Flu Shot  Completed  .  Hepatitis C: One time screening is recommended by Center for Disease Control  (CDC) for  adults born from [birth year] through 1964.   Completed  . Pneumonia vaccines  Completed      HYPERTENSION INFORMATION  Monitor your blood pressure at home, please keep a record and bring that in with you to your next office visit.   Go to the ER if any CP, SOB, nausea, dizziness, severe HA, changes vision/speech  Testing/Procedures: HOW TO TAKE YOUR BLOOD PRESSURE:  Rest 5 minutes before taking your blood pressure.  Don't smoke or drink caffeinated beverages for at least 30 minutes before.  Take your blood pressure before (not after) you eat.  Sit comfortably with your back supported and both feet on the floor (don't cross your legs).  Elevate your arm to heart level on a table or a desk.  Use the proper sized cuff. It should fit smoothly and snugly around your bare upper arm. There should be enough room to slip a fingertip under the cuff. The bottom edge of the cuff should be 1 inch above the crease of the elbow.  Your most recent BP: BP: (!) 158/90   Take your medications faithfully as instructed. Maintain a healthy weight. Get at least 150 minutes of aerobic exercise per week. Minimize salt intake. Minimize alcohol intake  DASH Eating Plan DASH  stands for "Dietary Approaches to Stop Hypertension." The DASH eating plan is a healthy eating plan that has been shown to reduce high blood pressure (hypertension). Additional health benefits may include reducing the risk of type 2 diabetes mellitus, heart disease, and stroke. The DASH eating plan may also help with weight loss. WHAT DO I NEED TO KNOW ABOUT THE DASH EATING PLAN? For the DASH eating plan, you will follow these general guidelines:  Choose foods with a percent daily value for sodium of less than 5% (as listed on the food label).  Use salt-free seasonings or herbs instead of table salt or sea salt.  Check with your health care provider or pharmacist before using salt substitutes.  Eat lower-sodium products, often labeled as "lower sodium" or "no salt added."  Eat fresh foods.  Eat more vegetables, fruits, and low-fat dairy products.  Choose whole grains. Look for the word "whole" as the first word in the ingredient list.  Choose fish and skinless chicken or [place] more often than red meat. Limit fish, poultry, and meat to 6 oz (170 g) each day.  Limit sweets, desserts, sugars, and sugary drinks.  Choose heart-healthy fats.  Limit cheese to 1 oz (28 g) per day.  Eat more home-cooked food and less restaurant, buffet, and fast food.  Limit fried foods.  Cook foods using methods other than  frying.  Limit canned vegetables. If you do use them, rinse them well to decrease the sodium.  When eating at a restaurant, ask that your food be prepared with less salt, or no salt if possible. WHAT FOODS CAN I EAT? Seek help from a dietitian for individual calorie needs. Grains Whole grain or whole wheat bread. Brown rice. Whole grain or whole wheat pasta. Quinoa, bulgur, and whole grain cereals. Low-sodium cereals. Corn or whole wheat flour tortillas. Whole grain cornbread. Whole grain crackers. Low-sodium crackers. Vegetables Fresh or frozen vegetables (raw, steamed, roasted,  or grilled). Low-sodium or reduced-sodium tomato and vegetable juices. Low-sodium or reduced-sodium tomato sauce and paste. Low-sodium or reduced-sodium canned vegetables.  Fruits All fresh, canned (in natural juice), or frozen fruits. Meat and Other Protein Products Ground beef (85% or leaner), grass-fed beef, or beef trimmed of fat. Skinless chicken or [place]. Ground chicken or [place]. Pork trimmed of fat. All fish and seafood. Eggs. Dried beans, peas, or lentils. Unsalted nuts and seeds. Unsalted canned beans. Dairy Low-fat dairy products, such as skim or 1% milk, 2% or reduced-fat cheeses, low-fat ricotta or cottage cheese, or plain low-fat yogurt. Low-sodium or reduced-sodium cheeses. Fats and Oils Tub margarines without trans fats. Light or reduced-fat mayonnaise and salad dressings (reduced sodium). Avocado. Safflower, olive, or canola oils. Natural peanut or almond butter. Other Unsalted popcorn and pretzels. The items listed above may not be a complete list of recommended foods or beverages. Contact your dietitian for more options. WHAT FOODS ARE NOT RECOMMENDED? Grains White bread. White pasta. White rice. Refined cornbread. Bagels and croissants. Crackers that contain trans fat. Vegetables Creamed or fried vegetables. Vegetables in a cheese sauce. Regular canned vegetables. Regular canned tomato sauce and paste. Regular tomato and vegetable juices. Fruits Dried fruits. Canned fruit in light or heavy syrup. Fruit juice. Meat and Other Protein Products Fatty cuts of meat. Ribs, chicken wings, bacon, sausage, bologna, salami, chitterlings, fatback, hot dogs, bratwurst, and packaged luncheon meats. Salted nuts and seeds. Canned beans with salt. Dairy Whole or 2% milk, cream, half-and-half, and cream cheese. Whole-fat or sweetened yogurt. Full-fat cheeses or blue cheese. Nondairy creamers and whipped toppings. Processed cheese, cheese spreads, or cheese curds. Condiments Onion and  garlic salt, seasoned salt, table salt, and sea salt. Canned and packaged gravies. Worcestershire sauce. Tartar sauce. Barbecue sauce. Teriyaki sauce. Soy sauce, including reduced sodium. Steak sauce. Fish sauce. Oyster sauce. Cocktail sauce. Horseradish. Ketchup and mustard. Meat flavorings and tenderizers. Bouillon cubes. Hot sauce. Tabasco sauce. Marinades. Taco seasonings. Relishes. Fats and Oils Butter, stick margarine, lard, shortening, ghee, and bacon fat. Coconut, palm kernel, or palm oils. Regular salad dressings. Other Pickles and olives. Salted popcorn and pretzels. The items listed above may not be a complete list of foods and beverages to avoid. Contact your dietitian for more information. WHERE CAN I FIND MORE INFORMATION? National Heart, Lung, and Blood Institute: travelstabloid.com Document Released: 07/21/2011 Document Revised: 12/16/2013 Document Reviewed: 06/05/2013 [facility] Patient Information 2015 [place], [place]. This information is not intended to replace advice given to you by your health care provider. Make sure you discuss any questions you have with your health care provider.       Hydrochlorothiazide, HCTZ Oral Capsules or Tablets What is this medicine? HYDROCHLOROTHIAZIDE (hye droe klor oh THYE a zide) is a diuretic. It helps you make more urine and to lose salt and excess water from your body. It treats swelling from heart, kidney, or liver disease. It also treats high blood pressure.  This medicine may be used for other purposes; ask your health care provider or pharmacist if you have questions. COMMON BRAND NAME(S): Esidrix, Ezide, HydroDIURIL, Microzide, Oretic, Zide What should I tell my health care provider before I take this medicine? They need to know if you have any of these conditions:  diabetes  gout  immune system problems, like lupus  kidney disease or kidney stones  liver disease  pancreatitis  small amount  of urine or difficulty passing urine  an unusual or allergic reaction to hydrochlorothiazide, sulfa drugs, other medicines, foods, dyes, or preservatives  pregnant or trying to get pregnant  breast-feeding How should I use this medicine? Take this drug by mouth. Take it as directed on the prescription label at the same time every day. You can take it with or without food. If it upsets your stomach, take it with food. Keep taking it unless your health care provider tells you to stop. Talk to your health care provider about the use of this drug in children. While it may be prescribed for children as young as newborns for selected conditions, precautions do apply. Overdosage: If you think you have taken too much of this medicine contact a poison control center or emergency room at once. NOTE: This medicine is only for you. Do not share this medicine with others. What if I miss a dose? If you miss a dose, take it as soon as you can. If it is almost time for your next dose, take only that dose. Do not take double or extra doses. What may interact with this medicine?  cholestyramine  colestipol  digoxin  dofetilide  lithium  medicines for blood pressure  medicines for diabetes  medicines that relax muscles for surgery  other diuretics  steroid medicines like prednisone or cortisone This list may not describe all possible interactions. Give your health care provider a list of all the medicines, herbs, non-prescription drugs, or dietary supplements you use. Also tell them if you smoke, drink alcohol, or use illegal drugs. Some items may interact with your medicine. What should I watch for while using this medicine? Visit your doctor or health care professional for regular checks on your progress. Check your blood pressure as directed. Ask your doctor or health care professional what your blood pressure should be and when you should contact him or her. Talk to your health care  professional about your risk of skin cancer. You may be more at risk for skin cancer if you take this medicine. This medicine can make you more sensitive to the sun. Keep out of the sun. If you cannot avoid being in the sun, wear protective clothing and use sunscreen. Do not use sun lamps or tanning beds/booths. You may need to be on a special diet while taking this medicine. Ask your doctor. Check with your doctor or health care professional if you get an attack of severe diarrhea, nausea and vomiting, or if you sweat a lot. The loss of too much body fluid can make it dangerous for you to take this medicine. You may get drowsy or dizzy. Do not drive, use machinery, or do anything that needs mental alertness until you know how this medicine affects you. Do not stand or sit up quickly, especially if you are an older patient. This reduces the risk of dizzy or fainting spells. Alcohol may interfere with the effect of this medicine. Avoid alcoholic drinks. This medicine may increase blood sugar. Ask your healthcare provider if changes in  diet or medicines are needed if you have diabetes. What side effects may I notice from receiving this medicine? Side effects that you should report to your doctor or health care professional as soon as possible:  allergic reactions such as skin rash or itching, hives, swelling of the lips, mouth, tongue, or throat  changes in vision  chest pain  eye pain  fast or irregular heartbeat  feeling faint or lightheaded, falls  gout attack  muscle pain or cramps  pain or difficulty when passing urine  pain, tingling, numbness in the hands or feet  redness, blistering, peeling or loosening of the skin, including inside the mouth   signs and symptoms of high blood sugar such as being more thirsty or hungry or having to urinate more than normal. You may also feel very tired or have blurry vision.  unusually weak Side effects that usually do not require medical  attention (report to your doctor or health care professional if they continue or are bothersome):  change in sex drive or performance  dry mouth  headache  stomach upset This list may not describe all possible side effects. Call your doctor for medical advice about side effects. You may report side effects to FDA at 1-800-FDA-1088. Where should I keep my medicine? Keep out of the reach of children and pets. Store at room temperature between 20 and 25 degrees C (68 and 77 degrees F). Protect from light and moisture. Keep the container tightly closed. Do not freeze. Throw away any unused drug after the expiration date. NOTE: This sheet is a summary. It may not cover all possible information. If you have questions about this medicine, talk to your doctor, pharmacist, or health care provider.  2020 Elsevier/Gold Standard (2019-04-04 16:52:59)

## 2019-11-12 ENCOUNTER — Ambulatory Visit
Admission: RE | Admit: 2019-11-12 | Discharge: 2019-11-12 | Disposition: A | Discharge status: Home / Self Care | Payer: Medicare Other | Source: Ambulatory Visit | Attending: Specialist | Admitting: Specialist

## 2019-11-12 DIAGNOSIS — M4316 Spondylolisthesis, lumbar region: Secondary | ICD-10-CM

## 2019-11-12 DIAGNOSIS — M5415 Radiculopathy, thoracolumbar region: Secondary | ICD-10-CM | POA: Diagnosis not present

## 2019-11-12 LAB — COMPLETE METABOLIC PANEL WITHOUT GFR
AG Ratio: 1.7 (calc) (ref 1.0–2.5)
ALT: 25 U/L (ref 6–29)
AST: 16 U/L (ref 10–35)
Albumin: 4.2 g/dL (ref 3.6–5.1)
Alkaline phosphatase (APISO): 48 U/L (ref 37–153)
BUN: 20 mg/dL (ref 7–25)
CO2: 30 mmol/L (ref 20–32)
Calcium: 9.3 mg/dL (ref 8.6–10.4)
Chloride: 106 mmol/L (ref 98–110)
Creat: 0.78 mg/dL (ref 0.60–0.93)
GFR, Est African American: 88 mL/min/{1.73_m2}
GFR, Est Non African American: 76 mL/min/{1.73_m2}
Globulin: 2.5 g/dL (ref 1.9–3.7)
Glucose, Bld: 92 mg/dL (ref 65–99)
Potassium: 4.2 mmol/L (ref 3.5–5.3)
Sodium: 143 mmol/L (ref 135–146)
Total Bilirubin: 0.5 mg/dL (ref 0.2–1.2)
Total Protein: 6.7 g/dL (ref 6.1–8.1)

## 2019-11-12 LAB — CBC WITH DIFFERENTIAL/PLATELET
Absolute Monocytes: 832 cells/uL (ref 200–950)
Basophils Absolute: 38 cells/uL (ref 0–200)
Basophils Relative: 0.3 %
Eosinophils Absolute: 51 cells/uL (ref 15–500)
Eosinophils Relative: 0.4 %
HCT: 44.8 % (ref 35.0–45.0)
Hemoglobin: 14.7 g/dL (ref 11.7–15.5)
Lymphs Abs: 2573 cells/uL (ref 850–3900)
MCH: 29.9 pg (ref 27.0–33.0)
MCHC: 32.8 g/dL (ref 32.0–36.0)
MCV: 91.1 fL (ref 80.0–100.0)
MPV: 10.8 fL (ref 7.5–12.5)
Monocytes Relative: 6.5 %
Neutro Abs: 9306 cells/uL — ABNORMAL HIGH (ref 1500–7800)
Neutrophils Relative %: 72.7 %
Platelets: 323 10*3/uL (ref 140–400)
RBC: 4.92 10*6/uL (ref 3.80–5.10)
RDW: 11.9 % (ref 11.0–15.0)
Total Lymphocyte: 20.1 %
WBC: 12.8 10*3/uL — ABNORMAL HIGH (ref 3.8–10.8)

## 2019-11-12 LAB — LIPID PANEL
Cholesterol: 150 mg/dL
HDL: 60 mg/dL
LDL Cholesterol (Calc): 74 mg/dL
Non-HDL Cholesterol (Calc): 90 mg/dL
Total CHOL/HDL Ratio: 2.5 (calc)
Triglycerides: 76 mg/dL

## 2019-11-12 LAB — MAGNESIUM: Magnesium: 2.2 mg/dL (ref 1.5–2.5)

## 2019-11-12 LAB — TSH: TSH: 1.4 mIU/L (ref 0.40–4.50)

## 2019-11-12 MED ORDER — METHYLPREDNISOLONE ACETATE 40 MG/ML INJ SUSP (RADIOLOG
120.0000 mg | Freq: Once | INTRAMUSCULAR | Status: AC
  Administered 2019-11-12: 120 mg via EPIDURAL

## 2019-11-12 MED ORDER — IOPAMIDOL (ISOVUE-M 200) INJECTION 41%
1.0000 mL | Freq: Once | INTRAMUSCULAR | Status: AC
  Administered 2019-11-12: 1 mL via EPIDURAL

## 2019-11-12 NOTE — Discharge Instructions (Signed)

## 2019-12-10 ENCOUNTER — Encounter: Payer: Self-pay | Admitting: Orthopaedic Surgery

## 2019-12-11 NOTE — Progress Notes (Signed)
Assessment and Plan:  Evelyne was seen today for follow-up.  Diagnoses and all orders for this visit:  Essential hypertension Continue medications Monitor blood pressure at home; call if consistently over 130/80 Continue DASH diet.   Reminder to go to the ER if any CP, SOB, nausea, dizziness, severe HA, changes vision/speech, left arm numbness and tingling and jaw pain. -     BASIC METABOLIC PANEL WITH GFR -     Magnesium  Further disposition pending results of labs. Discussed med's effects and SE's.   Over 15 minutes of exam, counseling, chart review, and critical decision making was performed.   Future Appointments  Date Time Provider [place]  06/04/2020  2:00 PM Unk Pinto, MD [facility] None  11/11/2020  2:00 PM Liane Comber, NP [facility] None    ------------------------------------------------------------------------------------------------------------------   HPI BP 132/78   Pulse 60   Temp (!) 97.3 F (36.3 C)   Ht 5\' 2"  (1.575 m)   Wt 150 lb (68 kg)   SpO2 97%   BMI 27.44 kg/m   74 y.o.female presents for 1 month follow up on hypertension after med adjustment.   She was previously taking bisoprolol 5 mg daily, reported persistent BPs with systolic [id number], at last visit was 158/90. She has had mild bradycardia in low 50s so BB not increased, HCTZ 25 mg daily was added after discussion. Also has mild chronic peripheral edema felt to be consistent with venous insufficiency.    She admits with upcoming surgery for back, stress with family, thought daughter was having a heart attack last week,  She eports BPs at home have been labile, up with stress 2 weeks ago, up to 170s/80s, but improved in the last week and have been 130s/70-80s on home log.   Her Today their BP is BP: 132/78 She denies chest pain, shortness of breath, dizziness.  BMI is Body mass index is 27.44 kg/m., Wt Readings from Last 3 Encounters:  12/12/19 150 lb (68 kg)   11/11/19 153 lb (69.4 kg)  11/05/19 154 lb (69.9 kg)     Past Medical History:  Diagnosis Date  . Allergy   . Anemia   . Anxiety   . Depression   . Hyperlipidemia   . Hypertension   . Lumbar stenosis   . Migraines   . Prediabetes      Allergies  Allergen Reactions  . Augmentin [Amoxicillin-Pot Clavulanate] Diarrhea  . Doxycycline Nausea Only  . Egg White (Diagnostic) Nausea Only  . Prozac [Fluoxetine Hcl] Other (See Comments)    Disoriented    Current Outpatient Medications on File Prior to Visit  Medication Sig  . ALPRAZolam (XANAX) 0.25 MG tablet Take 1/2 to 1 tablet 2 to 3 x / day ONLY if needed for Acute Anxiety Attacks  & please try to limit to 5 days / week to avoid addiction  . Ascorbic Acid (VITAMIN C PO) 500 mg 2 (two) times daily.  Marland Kitchen aspirin 325 MG tablet Take 325 mg by mouth daily.  . bisoprolol (ZEBETA) 5 MG tablet Take 1 tablet Daily for BP  . busPIRone (BUSPAR) 10 MG tablet Take 1/2-1 tab three times a day daily for anxiety and mood. (Patient taking differently: as needed. Take 1/2-1 tab three times a day daily for anxiety and mood.)  . Cholecalciferol (VITAMIN D PO) Take by mouth. Takes 10000 to 12000 units daily.  . clotrimazole-betamethasone (LOTRISONE) cream Apply 1 application topically 2 (two) times daily. (Patient taking differently: Apply 1 application topically  as needed. )  . diclofenac sodium (VOLTAREN) 1 % GEL Apply 2 g topically 4 (four) times daily.  . DULoxetine (CYMBALTA) 60 MG capsule TAKE 1 CAPSULE BY MOUTH EVERY DAY  . estradiol (ESTRACE) 0.5 MG tablet TAKE 1 TABLET BY MOUTH EVERY DAY  . fexofenadine (ALLEGRA) 180 MG tablet Take 180 mg by mouth daily.  . hydrochlorothiazide (HYDRODIURIL) 25 MG tablet Take 1/2 -1 tablet daily for BP and fluid for goal <130/80.  Marland Kitchen HYDROcodone-acetaminophen (NORCO/VICODIN) 5-325 MG tablet Take 1 tablet by mouth every 6 (six) hours as needed for moderate pain.  . pravastatin (PRAVACHOL) 40 MG tablet Take 1  tablet at Bedtime for Cholesterol  . progesterone (PROMETRIUM) 100 MG capsule TAKE ONE CAPSULE BY MOUTH DAILY  . rizatriptan (MAXALT) 10 MG tablet Take 1 tablet for Migraine & may repeat 1 x in 2 hrs - ((Maximum 2 tablets  /24 hours))  . tizanidine (ZANAFLEX) 2 MG capsule Take 1 capsule (2 mg total) by mouth 3 (three) times daily as needed for muscle spasms.  Marland Kitchen zinc gluconate 50 MG tablet Take 50 mg by mouth daily.  . pregabalin (LYRICA) 50 MG capsule Take 1 capsule (50 mg total) by mouth daily. (Patient not taking: Reported on 12/12/2019)   No current facility-administered medications on file prior to visit.    ROS: all negative except above.   Physical Exam:  BP 132/78   Pulse 60   Temp (!) 97.3 F (36.3 C)   Ht 5\' 2"  (1.575 m)   Wt 150 lb (68 kg)   SpO2 97%   BMI 27.44 kg/m   General Appearance: Well nourished, in no apparent distress. Eyes: PERRLA, conjunctiva no swelling or erythema Sinuses: No Frontal/maxillary tenderness ENT/Mouth: mask in place. Hearing normal.  Neck: Supple, thyroid normal.  Respiratory: Respiratory effort normal, BS equal bilaterally without rales, rhonchi, wheezing or stridor.  Cardio: RRR with no MRGs. Brisk peripheral pulses without edema.  Abdomen: Soft, + BS.  Non tender, no guarding, rebound, hernias, masses. Lymphatics: Non tender without lymphadenopathy.  Musculoskeletal: Full ROM, 5/5 strength, normal gait.  Skin: Warm, dry without rashes, lesions, ecchymosis.  Neuro: Cranial nerves intact. Normal muscle tone, no cerebellar symptoms. Sensation intact.  Psych: Awake and oriented X 3, normal affect, Insight and Judgment appropriate.     Izora Ribas, NP 5:13 PM [facility]

## 2019-12-11 NOTE — Telephone Encounter (Signed)
Please refer this question to the appropriate insurance desk-thanks

## 2019-12-12 ENCOUNTER — Encounter: Payer: Self-pay | Admitting: Adult Health

## 2019-12-12 ENCOUNTER — Other Ambulatory Visit: Payer: Self-pay

## 2019-12-12 ENCOUNTER — Ambulatory Visit (INDEPENDENT_AMBULATORY_CARE_PROVIDER_SITE_OTHER): Payer: Medicare Other | Admitting: Adult Health

## 2019-12-12 VITALS — BP 132/78 | HR 60 | Temp 97.3°F | Ht 62.0 in | Wt 150.0 lb

## 2019-12-12 DIAGNOSIS — I10 Essential (primary) hypertension: Secondary | ICD-10-CM

## 2019-12-12 DIAGNOSIS — Z79899 Other long term (current) drug therapy: Secondary | ICD-10-CM

## 2019-12-13 LAB — BASIC METABOLIC PANEL WITH GFR
BUN/Creatinine Ratio: 15 (calc) (ref 6–22)
BUN: 16 mg/dL (ref 7–25)
CO2: 32 mmol/L (ref 20–32)
Calcium: 9.9 mg/dL (ref 8.6–10.4)
Chloride: 100 mmol/L (ref 98–110)
Creat: 1.04 mg/dL — ABNORMAL HIGH (ref 0.60–0.93)
GFR, Est African American: 62 mL/min/{1.73_m2} (ref >=60)
GFR, Est Non African American: 54 mL/min/{1.73_m2} — ABNORMAL LOW (ref >=60)
Glucose, Bld: 90 mg/dL (ref 65–99)
Potassium: 3.8 mmol/L (ref 3.5–5.3)
Sodium: 141 mmol/L (ref 135–146)

## 2019-12-13 LAB — MAGNESIUM: Magnesium: 2.2 mg/dL (ref 1.5–2.5)

## 2019-12-16 DIAGNOSIS — Z01812 Encounter for preprocedural laboratory examination: Secondary | ICD-10-CM | POA: Diagnosis not present

## 2019-12-16 DIAGNOSIS — M4180 Other forms of scoliosis, site unspecified: Secondary | ICD-10-CM | POA: Diagnosis not present

## 2019-12-17 DIAGNOSIS — Z20822 Contact with and (suspected) exposure to covid-19: Secondary | ICD-10-CM | POA: Diagnosis not present

## 2019-12-23 DIAGNOSIS — Z20822 Contact with and (suspected) exposure to covid-19: Secondary | ICD-10-CM | POA: Diagnosis present

## 2019-12-23 DIAGNOSIS — D62 Acute posthemorrhagic anemia: Secondary | ICD-10-CM | POA: Diagnosis not present

## 2019-12-23 DIAGNOSIS — Z8249 Family history of ischemic heart disease and other diseases of the circulatory system: Secondary | ICD-10-CM | POA: Diagnosis not present

## 2019-12-23 DIAGNOSIS — M4186 Other forms of scoliosis, lumbar region: Secondary | ICD-10-CM | POA: Diagnosis not present

## 2019-12-23 DIAGNOSIS — M4180 Other forms of scoliosis, site unspecified: Secondary | ICD-10-CM | POA: Diagnosis not present

## 2019-12-23 DIAGNOSIS — M4185 Other forms of scoliosis, thoracolumbar region: Secondary | ICD-10-CM | POA: Diagnosis not present

## 2019-12-23 DIAGNOSIS — M4805 Spinal stenosis, thoracolumbar region: Secondary | ICD-10-CM | POA: Diagnosis not present

## 2019-12-23 DIAGNOSIS — Z7409 Other reduced mobility: Secondary | ICD-10-CM | POA: Diagnosis not present

## 2019-12-23 DIAGNOSIS — E785 Hyperlipidemia, unspecified: Secondary | ICD-10-CM | POA: Diagnosis not present

## 2019-12-23 DIAGNOSIS — M5186 Other intervertebral disc disorders, lumbar region: Secondary | ICD-10-CM | POA: Diagnosis not present

## 2019-12-23 DIAGNOSIS — I10 Essential (primary) hypertension: Secondary | ICD-10-CM | POA: Diagnosis not present

## 2019-12-23 DIAGNOSIS — G43909 Migraine, unspecified, not intractable, without status migrainosus: Secondary | ICD-10-CM | POA: Diagnosis present

## 2019-12-23 DIAGNOSIS — R32 Unspecified urinary incontinence: Secondary | ICD-10-CM | POA: Diagnosis not present

## 2019-12-23 DIAGNOSIS — F329 Major depressive disorder, single episode, unspecified: Secondary | ICD-10-CM | POA: Diagnosis present

## 2019-12-23 DIAGNOSIS — Z9889 Other specified postprocedural states: Secondary | ICD-10-CM | POA: Diagnosis not present

## 2019-12-23 DIAGNOSIS — M48062 Spinal stenosis, lumbar region with neurogenic claudication: Secondary | ICD-10-CM | POA: Diagnosis not present

## 2019-12-27 DIAGNOSIS — D62 Acute posthemorrhagic anemia: Secondary | ICD-10-CM | POA: Diagnosis not present

## 2019-12-27 DIAGNOSIS — R4189 Other symptoms and signs involving cognitive functions and awareness: Secondary | ICD-10-CM | POA: Diagnosis not present

## 2019-12-27 DIAGNOSIS — I951 Orthostatic hypotension: Secondary | ICD-10-CM | POA: Diagnosis not present

## 2019-12-27 DIAGNOSIS — M4185 Other forms of scoliosis, thoracolumbar region: Secondary | ICD-10-CM | POA: Diagnosis not present

## 2019-12-27 DIAGNOSIS — E876 Hypokalemia: Secondary | ICD-10-CM | POA: Diagnosis not present

## 2019-12-27 DIAGNOSIS — G43909 Migraine, unspecified, not intractable, without status migrainosus: Secondary | ICD-10-CM | POA: Diagnosis not present

## 2019-12-27 DIAGNOSIS — F419 Anxiety disorder, unspecified: Secondary | ICD-10-CM | POA: Diagnosis not present

## 2019-12-27 DIAGNOSIS — I10 Essential (primary) hypertension: Secondary | ICD-10-CM | POA: Diagnosis not present

## 2019-12-27 DIAGNOSIS — F329 Major depressive disorder, single episode, unspecified: Secondary | ICD-10-CM | POA: Diagnosis not present

## 2019-12-27 DIAGNOSIS — R109 Unspecified abdominal pain: Secondary | ICD-10-CM | POA: Diagnosis not present

## 2019-12-27 DIAGNOSIS — E785 Hyperlipidemia, unspecified: Secondary | ICD-10-CM | POA: Diagnosis not present

## 2019-12-27 DIAGNOSIS — Z7409 Other reduced mobility: Secondary | ICD-10-CM | POA: Diagnosis not present

## 2019-12-27 MED ORDER — POLYETHYLENE GLYCOL 3350 17 GM/SCOOP PO POWD
17.00 | ORAL | Status: DC
Start: ? — End: 2019-12-27

## 2019-12-27 MED ORDER — CELECOXIB 200 MG PO CAPS
200.00 | ORAL_CAPSULE | ORAL | Status: DC
Start: 2020-01-01 — End: 2019-12-27

## 2019-12-27 MED ORDER — DULOXETINE HCL 60 MG PO CPEP
60.00 | ORAL_CAPSULE | ORAL | Status: DC
Start: 2020-01-02 — End: 2019-12-27

## 2019-12-27 MED ORDER — PRAVASTATIN SODIUM 40 MG PO TABS
40.00 | ORAL_TABLET | ORAL | Status: DC
Start: 2020-01-02 — End: 2019-12-27

## 2019-12-27 MED ORDER — ACETAMINOPHEN 325 MG PO TABS
650.00 | ORAL_TABLET | ORAL | Status: DC
Start: ? — End: 2019-12-27

## 2019-12-27 MED ORDER — GABAPENTIN 100 MG PO CAPS
100.00 | ORAL_CAPSULE | ORAL | Status: DC
Start: 2019-12-27 — End: 2019-12-27

## 2019-12-27 MED ORDER — HYDROCODONE-ACETAMINOPHEN 5-325 MG PO TABS
1.00 | ORAL_TABLET | ORAL | Status: DC
Start: ? — End: 2019-12-27

## 2019-12-27 MED ORDER — METOPROLOL TARTRATE 25 MG PO TABS
25.00 | ORAL_TABLET | ORAL | Status: DC
Start: 2020-01-01 — End: 2019-12-27

## 2019-12-27 MED ORDER — SENNOSIDES-DOCUSATE SODIUM 8.6-50 MG PO TABS
2.00 | ORAL_TABLET | ORAL | Status: DC
Start: ? — End: 2019-12-27

## 2019-12-27 MED ORDER — HEPARIN SODIUM (PORCINE) 5000 UNIT/ML IJ SOLN
5000.00 | INTRAMUSCULAR | Status: DC
Start: 2019-12-27 — End: 2019-12-27

## 2019-12-29 DIAGNOSIS — R140 Abdominal distension (gaseous): Secondary | ICD-10-CM | POA: Diagnosis not present

## 2019-12-30 DIAGNOSIS — R4189 Other symptoms and signs involving cognitive functions and awareness: Secondary | ICD-10-CM | POA: Diagnosis not present

## 2019-12-30 DIAGNOSIS — F419 Anxiety disorder, unspecified: Secondary | ICD-10-CM | POA: Diagnosis not present

## 2019-12-30 DIAGNOSIS — Z7409 Other reduced mobility: Secondary | ICD-10-CM | POA: Diagnosis not present

## 2019-12-30 DIAGNOSIS — I10 Essential (primary) hypertension: Secondary | ICD-10-CM | POA: Diagnosis not present

## 2019-12-30 DIAGNOSIS — R109 Unspecified abdominal pain: Secondary | ICD-10-CM | POA: Diagnosis not present

## 2019-12-30 DIAGNOSIS — F329 Major depressive disorder, single episode, unspecified: Secondary | ICD-10-CM | POA: Diagnosis not present

## 2019-12-30 DIAGNOSIS — E785 Hyperlipidemia, unspecified: Secondary | ICD-10-CM | POA: Diagnosis not present

## 2019-12-30 DIAGNOSIS — E876 Hypokalemia: Secondary | ICD-10-CM | POA: Diagnosis not present

## 2019-12-30 DIAGNOSIS — G43909 Migraine, unspecified, not intractable, without status migrainosus: Secondary | ICD-10-CM | POA: Diagnosis not present

## 2019-12-30 DIAGNOSIS — M4185 Other forms of scoliosis, thoracolumbar region: Secondary | ICD-10-CM | POA: Diagnosis not present

## 2019-12-30 DIAGNOSIS — I951 Orthostatic hypotension: Secondary | ICD-10-CM | POA: Diagnosis not present

## 2019-12-30 DIAGNOSIS — D62 Acute posthemorrhagic anemia: Secondary | ICD-10-CM | POA: Diagnosis not present

## 2019-12-31 DIAGNOSIS — I10 Essential (primary) hypertension: Secondary | ICD-10-CM | POA: Diagnosis not present

## 2019-12-31 DIAGNOSIS — F329 Major depressive disorder, single episode, unspecified: Secondary | ICD-10-CM | POA: Diagnosis not present

## 2019-12-31 DIAGNOSIS — R4189 Other symptoms and signs involving cognitive functions and awareness: Secondary | ICD-10-CM | POA: Diagnosis not present

## 2019-12-31 DIAGNOSIS — Z7409 Other reduced mobility: Secondary | ICD-10-CM | POA: Diagnosis not present

## 2019-12-31 DIAGNOSIS — I951 Orthostatic hypotension: Secondary | ICD-10-CM | POA: Diagnosis not present

## 2019-12-31 DIAGNOSIS — G43909 Migraine, unspecified, not intractable, without status migrainosus: Secondary | ICD-10-CM | POA: Diagnosis not present

## 2019-12-31 DIAGNOSIS — D62 Acute posthemorrhagic anemia: Secondary | ICD-10-CM | POA: Diagnosis not present

## 2019-12-31 DIAGNOSIS — F419 Anxiety disorder, unspecified: Secondary | ICD-10-CM | POA: Diagnosis not present

## 2019-12-31 DIAGNOSIS — E785 Hyperlipidemia, unspecified: Secondary | ICD-10-CM | POA: Diagnosis not present

## 2019-12-31 DIAGNOSIS — R109 Unspecified abdominal pain: Secondary | ICD-10-CM | POA: Diagnosis not present

## 2019-12-31 DIAGNOSIS — M4185 Other forms of scoliosis, thoracolumbar region: Secondary | ICD-10-CM | POA: Diagnosis not present

## 2019-12-31 DIAGNOSIS — E876 Hypokalemia: Secondary | ICD-10-CM | POA: Diagnosis not present

## 2020-01-01 DIAGNOSIS — I10 Essential (primary) hypertension: Secondary | ICD-10-CM | POA: Diagnosis not present

## 2020-01-01 DIAGNOSIS — F329 Major depressive disorder, single episode, unspecified: Secondary | ICD-10-CM | POA: Diagnosis not present

## 2020-01-01 DIAGNOSIS — G43909 Migraine, unspecified, not intractable, without status migrainosus: Secondary | ICD-10-CM | POA: Diagnosis not present

## 2020-01-01 DIAGNOSIS — R109 Unspecified abdominal pain: Secondary | ICD-10-CM | POA: Diagnosis not present

## 2020-01-01 DIAGNOSIS — R4189 Other symptoms and signs involving cognitive functions and awareness: Secondary | ICD-10-CM | POA: Diagnosis not present

## 2020-01-01 DIAGNOSIS — M4185 Other forms of scoliosis, thoracolumbar region: Secondary | ICD-10-CM | POA: Diagnosis not present

## 2020-01-01 DIAGNOSIS — E876 Hypokalemia: Secondary | ICD-10-CM | POA: Diagnosis not present

## 2020-01-01 DIAGNOSIS — Z7409 Other reduced mobility: Secondary | ICD-10-CM | POA: Diagnosis not present

## 2020-01-01 DIAGNOSIS — F419 Anxiety disorder, unspecified: Secondary | ICD-10-CM | POA: Diagnosis not present

## 2020-01-01 DIAGNOSIS — I951 Orthostatic hypotension: Secondary | ICD-10-CM | POA: Diagnosis not present

## 2020-01-01 DIAGNOSIS — D62 Acute posthemorrhagic anemia: Secondary | ICD-10-CM | POA: Diagnosis not present

## 2020-01-01 DIAGNOSIS — E785 Hyperlipidemia, unspecified: Secondary | ICD-10-CM | POA: Diagnosis not present

## 2020-01-01 MED ORDER — HYDROCODONE-ACETAMINOPHEN 5-325 MG PO TABS
1.00 | ORAL_TABLET | ORAL | Status: DC
Start: ? — End: 2020-01-01

## 2020-01-01 MED ORDER — SIMETHICONE 80 MG PO CHEW
80.00 | CHEWABLE_TABLET | ORAL | Status: DC
Start: 2020-01-01 — End: 2020-01-01

## 2020-01-01 MED ORDER — GABAPENTIN 300 MG PO CAPS
300.00 | ORAL_CAPSULE | ORAL | Status: DC
Start: 2020-01-01 — End: 2020-01-01

## 2020-01-01 MED ORDER — SORBITOL 70 % PO SOLN
30.00 | ORAL | Status: DC
Start: ? — End: 2020-01-01

## 2020-01-01 MED ORDER — ESTRADIOL 1 MG PO TABS
0.50 | ORAL_TABLET | ORAL | Status: DC
Start: 2020-01-02 — End: 2020-01-01

## 2020-01-01 MED ORDER — ACETAMINOPHEN 325 MG PO TABS
650.00 | ORAL_TABLET | ORAL | Status: DC
Start: 2020-01-01 — End: 2020-01-01

## 2020-01-01 MED ORDER — HYDROXYZINE HCL 25 MG PO TABS
25.00 | ORAL_TABLET | ORAL | Status: DC
Start: ? — End: 2020-01-01

## 2020-01-01 MED ORDER — PROGESTERONE MICRONIZED 100 MG PO CAPS
100.00 | ORAL_CAPSULE | ORAL | Status: DC
Start: 2020-01-02 — End: 2020-01-01

## 2020-01-01 MED ORDER — ONDANSETRON 4 MG PO TBDP
4.00 | ORAL_TABLET | ORAL | Status: DC
Start: ? — End: 2020-01-01

## 2020-01-01 MED ORDER — SENNOSIDES 8.6 MG PO TABS
8.60 | ORAL_TABLET | ORAL | Status: DC
Start: 2020-01-01 — End: 2020-01-01

## 2020-01-02 DIAGNOSIS — D62 Acute posthemorrhagic anemia: Secondary | ICD-10-CM | POA: Diagnosis not present

## 2020-01-02 DIAGNOSIS — G43909 Migraine, unspecified, not intractable, without status migrainosus: Secondary | ICD-10-CM | POA: Diagnosis not present

## 2020-01-02 DIAGNOSIS — E876 Hypokalemia: Secondary | ICD-10-CM | POA: Diagnosis not present

## 2020-01-02 DIAGNOSIS — I951 Orthostatic hypotension: Secondary | ICD-10-CM | POA: Diagnosis not present

## 2020-01-02 DIAGNOSIS — I10 Essential (primary) hypertension: Secondary | ICD-10-CM | POA: Diagnosis not present

## 2020-01-02 DIAGNOSIS — F329 Major depressive disorder, single episode, unspecified: Secondary | ICD-10-CM | POA: Diagnosis not present

## 2020-01-02 DIAGNOSIS — R4189 Other symptoms and signs involving cognitive functions and awareness: Secondary | ICD-10-CM | POA: Diagnosis not present

## 2020-01-02 DIAGNOSIS — F419 Anxiety disorder, unspecified: Secondary | ICD-10-CM | POA: Diagnosis not present

## 2020-01-02 DIAGNOSIS — M4185 Other forms of scoliosis, thoracolumbar region: Secondary | ICD-10-CM | POA: Diagnosis not present

## 2020-01-02 DIAGNOSIS — R109 Unspecified abdominal pain: Secondary | ICD-10-CM | POA: Diagnosis not present

## 2020-01-02 DIAGNOSIS — E785 Hyperlipidemia, unspecified: Secondary | ICD-10-CM | POA: Diagnosis not present

## 2020-01-02 DIAGNOSIS — Z7409 Other reduced mobility: Secondary | ICD-10-CM | POA: Diagnosis not present

## 2020-01-03 DIAGNOSIS — R609 Edema, unspecified: Secondary | ICD-10-CM | POA: Diagnosis not present

## 2020-01-03 DIAGNOSIS — F418 Other specified anxiety disorders: Secondary | ICD-10-CM | POA: Diagnosis not present

## 2020-01-03 DIAGNOSIS — M419 Scoliosis, unspecified: Secondary | ICD-10-CM | POA: Diagnosis not present

## 2020-01-03 DIAGNOSIS — R4189 Other symptoms and signs involving cognitive functions and awareness: Secondary | ICD-10-CM | POA: Diagnosis not present

## 2020-01-03 DIAGNOSIS — I10 Essential (primary) hypertension: Secondary | ICD-10-CM | POA: Diagnosis not present

## 2020-01-03 DIAGNOSIS — J309 Allergic rhinitis, unspecified: Secondary | ICD-10-CM | POA: Diagnosis not present

## 2020-01-03 DIAGNOSIS — R109 Unspecified abdominal pain: Secondary | ICD-10-CM | POA: Diagnosis not present

## 2020-01-03 DIAGNOSIS — R262 Difficulty in walking, not elsewhere classified: Secondary | ICD-10-CM | POA: Diagnosis not present

## 2020-01-03 DIAGNOSIS — Z4782 Encounter for orthopedic aftercare following scoliosis surgery: Secondary | ICD-10-CM | POA: Diagnosis not present

## 2020-01-03 DIAGNOSIS — R41841 Cognitive communication deficit: Secondary | ICD-10-CM | POA: Diagnosis not present

## 2020-01-03 DIAGNOSIS — F419 Anxiety disorder, unspecified: Secondary | ICD-10-CM | POA: Diagnosis not present

## 2020-01-03 DIAGNOSIS — D62 Acute posthemorrhagic anemia: Secondary | ICD-10-CM | POA: Diagnosis not present

## 2020-01-03 DIAGNOSIS — K5900 Constipation, unspecified: Secondary | ICD-10-CM | POA: Diagnosis not present

## 2020-01-03 DIAGNOSIS — E785 Hyperlipidemia, unspecified: Secondary | ICD-10-CM | POA: Diagnosis not present

## 2020-01-03 DIAGNOSIS — I951 Orthostatic hypotension: Secondary | ICD-10-CM | POA: Diagnosis not present

## 2020-01-03 DIAGNOSIS — G43909 Migraine, unspecified, not intractable, without status migrainosus: Secondary | ICD-10-CM | POA: Diagnosis not present

## 2020-01-03 DIAGNOSIS — M4185 Other forms of scoliosis, thoracolumbar region: Secondary | ICD-10-CM | POA: Diagnosis not present

## 2020-01-03 DIAGNOSIS — F330 Major depressive disorder, recurrent, mild: Secondary | ICD-10-CM | POA: Diagnosis not present

## 2020-01-03 DIAGNOSIS — Z4802 Encounter for removal of sutures: Secondary | ICD-10-CM | POA: Diagnosis not present

## 2020-01-03 DIAGNOSIS — E876 Hypokalemia: Secondary | ICD-10-CM | POA: Diagnosis not present

## 2020-01-03 DIAGNOSIS — G3184 Mild cognitive impairment, so stated: Secondary | ICD-10-CM | POA: Diagnosis not present

## 2020-01-03 DIAGNOSIS — Z7409 Other reduced mobility: Secondary | ICD-10-CM | POA: Diagnosis not present

## 2020-01-03 DIAGNOSIS — R278 Other lack of coordination: Secondary | ICD-10-CM | POA: Diagnosis not present

## 2020-01-03 DIAGNOSIS — D649 Anemia, unspecified: Secondary | ICD-10-CM | POA: Diagnosis not present

## 2020-01-03 DIAGNOSIS — K567 Ileus, unspecified: Secondary | ICD-10-CM | POA: Diagnosis not present

## 2020-01-03 DIAGNOSIS — F329 Major depressive disorder, single episode, unspecified: Secondary | ICD-10-CM | POA: Diagnosis not present

## 2020-01-07 DIAGNOSIS — Z4802 Encounter for removal of sutures: Secondary | ICD-10-CM | POA: Diagnosis not present

## 2020-01-07 DIAGNOSIS — K5900 Constipation, unspecified: Secondary | ICD-10-CM | POA: Diagnosis not present

## 2020-01-07 DIAGNOSIS — M419 Scoliosis, unspecified: Secondary | ICD-10-CM | POA: Diagnosis not present

## 2020-01-07 DIAGNOSIS — J309 Allergic rhinitis, unspecified: Secondary | ICD-10-CM | POA: Diagnosis not present

## 2020-01-07 DIAGNOSIS — I10 Essential (primary) hypertension: Secondary | ICD-10-CM | POA: Diagnosis not present

## 2020-01-08 DIAGNOSIS — D649 Anemia, unspecified: Secondary | ICD-10-CM | POA: Diagnosis not present

## 2020-01-08 DIAGNOSIS — F329 Major depressive disorder, single episode, unspecified: Secondary | ICD-10-CM | POA: Diagnosis not present

## 2020-01-08 DIAGNOSIS — I10 Essential (primary) hypertension: Secondary | ICD-10-CM | POA: Diagnosis not present

## 2020-01-08 DIAGNOSIS — G43909 Migraine, unspecified, not intractable, without status migrainosus: Secondary | ICD-10-CM | POA: Diagnosis not present

## 2020-01-09 ENCOUNTER — Ambulatory Visit: Payer: Medicare Other | Admitting: Adult Health Nurse Practitioner

## 2020-01-14 DIAGNOSIS — M419 Scoliosis, unspecified: Secondary | ICD-10-CM | POA: Diagnosis not present

## 2020-01-14 DIAGNOSIS — R202 Paresthesia of skin: Secondary | ICD-10-CM | POA: Diagnosis not present

## 2020-01-14 DIAGNOSIS — I10 Essential (primary) hypertension: Secondary | ICD-10-CM | POA: Diagnosis not present

## 2020-01-14 DIAGNOSIS — K5900 Constipation, unspecified: Secondary | ICD-10-CM | POA: Diagnosis not present

## 2020-01-15 DIAGNOSIS — F411 Generalized anxiety disorder: Secondary | ICD-10-CM | POA: Diagnosis not present

## 2020-01-15 DIAGNOSIS — F329 Major depressive disorder, single episode, unspecified: Secondary | ICD-10-CM | POA: Diagnosis not present

## 2020-01-15 DIAGNOSIS — F4323 Adjustment disorder with mixed anxiety and depressed mood: Secondary | ICD-10-CM | POA: Diagnosis not present

## 2020-01-15 DIAGNOSIS — F4321 Adjustment disorder with depressed mood: Secondary | ICD-10-CM | POA: Diagnosis not present

## 2020-01-22 DIAGNOSIS — M810 Age-related osteoporosis without current pathological fracture: Secondary | ICD-10-CM | POA: Diagnosis not present

## 2020-01-22 DIAGNOSIS — I10 Essential (primary) hypertension: Secondary | ICD-10-CM | POA: Diagnosis not present

## 2020-01-22 DIAGNOSIS — M419 Scoliosis, unspecified: Secondary | ICD-10-CM | POA: Diagnosis not present

## 2020-01-22 DIAGNOSIS — R202 Paresthesia of skin: Secondary | ICD-10-CM | POA: Diagnosis not present

## 2020-02-04 DIAGNOSIS — Z4782 Encounter for orthopedic aftercare following scoliosis surgery: Secondary | ICD-10-CM | POA: Diagnosis not present

## 2020-02-04 DIAGNOSIS — G43909 Migraine, unspecified, not intractable, without status migrainosus: Secondary | ICD-10-CM | POA: Diagnosis not present

## 2020-02-04 DIAGNOSIS — F329 Major depressive disorder, single episode, unspecified: Secondary | ICD-10-CM | POA: Diagnosis not present

## 2020-02-04 DIAGNOSIS — M4125 Other idiopathic scoliosis, thoracolumbar region: Secondary | ICD-10-CM | POA: Diagnosis not present

## 2020-02-04 DIAGNOSIS — R41841 Cognitive communication deficit: Secondary | ICD-10-CM | POA: Diagnosis not present

## 2020-02-04 DIAGNOSIS — F411 Generalized anxiety disorder: Secondary | ICD-10-CM | POA: Diagnosis not present

## 2020-02-04 DIAGNOSIS — Z9181 History of falling: Secondary | ICD-10-CM | POA: Diagnosis not present

## 2020-02-04 DIAGNOSIS — I10 Essential (primary) hypertension: Secondary | ICD-10-CM | POA: Diagnosis not present

## 2020-02-04 DIAGNOSIS — D649 Anemia, unspecified: Secondary | ICD-10-CM | POA: Diagnosis not present

## 2020-02-05 DIAGNOSIS — R41841 Cognitive communication deficit: Secondary | ICD-10-CM | POA: Diagnosis not present

## 2020-02-05 DIAGNOSIS — I10 Essential (primary) hypertension: Secondary | ICD-10-CM | POA: Diagnosis not present

## 2020-02-05 DIAGNOSIS — F329 Major depressive disorder, single episode, unspecified: Secondary | ICD-10-CM | POA: Diagnosis not present

## 2020-02-05 DIAGNOSIS — M4125 Other idiopathic scoliosis, thoracolumbar region: Secondary | ICD-10-CM | POA: Diagnosis not present

## 2020-02-05 DIAGNOSIS — F411 Generalized anxiety disorder: Secondary | ICD-10-CM | POA: Diagnosis not present

## 2020-02-05 DIAGNOSIS — Z4782 Encounter for orthopedic aftercare following scoliosis surgery: Secondary | ICD-10-CM | POA: Diagnosis not present

## 2020-02-07 DIAGNOSIS — F411 Generalized anxiety disorder: Secondary | ICD-10-CM | POA: Diagnosis not present

## 2020-02-07 DIAGNOSIS — Z4782 Encounter for orthopedic aftercare following scoliosis surgery: Secondary | ICD-10-CM | POA: Diagnosis not present

## 2020-02-07 DIAGNOSIS — M4125 Other idiopathic scoliosis, thoracolumbar region: Secondary | ICD-10-CM | POA: Diagnosis not present

## 2020-02-07 DIAGNOSIS — R41841 Cognitive communication deficit: Secondary | ICD-10-CM | POA: Diagnosis not present

## 2020-02-07 DIAGNOSIS — I10 Essential (primary) hypertension: Secondary | ICD-10-CM | POA: Diagnosis not present

## 2020-02-07 DIAGNOSIS — F329 Major depressive disorder, single episode, unspecified: Secondary | ICD-10-CM | POA: Diagnosis not present

## 2020-02-10 DIAGNOSIS — F411 Generalized anxiety disorder: Secondary | ICD-10-CM | POA: Diagnosis not present

## 2020-02-10 DIAGNOSIS — M4125 Other idiopathic scoliosis, thoracolumbar region: Secondary | ICD-10-CM | POA: Diagnosis not present

## 2020-02-10 DIAGNOSIS — I10 Essential (primary) hypertension: Secondary | ICD-10-CM | POA: Diagnosis not present

## 2020-02-10 DIAGNOSIS — R41841 Cognitive communication deficit: Secondary | ICD-10-CM | POA: Diagnosis not present

## 2020-02-10 DIAGNOSIS — F329 Major depressive disorder, single episode, unspecified: Secondary | ICD-10-CM | POA: Diagnosis not present

## 2020-02-10 DIAGNOSIS — Z4782 Encounter for orthopedic aftercare following scoliosis surgery: Secondary | ICD-10-CM | POA: Diagnosis not present

## 2020-02-11 DIAGNOSIS — Z4782 Encounter for orthopedic aftercare following scoliosis surgery: Secondary | ICD-10-CM | POA: Diagnosis not present

## 2020-02-11 DIAGNOSIS — I10 Essential (primary) hypertension: Secondary | ICD-10-CM | POA: Diagnosis not present

## 2020-02-11 DIAGNOSIS — M4125 Other idiopathic scoliosis, thoracolumbar region: Secondary | ICD-10-CM | POA: Diagnosis not present

## 2020-02-11 DIAGNOSIS — F329 Major depressive disorder, single episode, unspecified: Secondary | ICD-10-CM | POA: Diagnosis not present

## 2020-02-11 DIAGNOSIS — R41841 Cognitive communication deficit: Secondary | ICD-10-CM | POA: Diagnosis not present

## 2020-02-11 DIAGNOSIS — F411 Generalized anxiety disorder: Secondary | ICD-10-CM | POA: Diagnosis not present

## 2020-02-12 ENCOUNTER — Ambulatory Visit (INDEPENDENT_AMBULATORY_CARE_PROVIDER_SITE_OTHER): Payer: Medicare Other | Admitting: Physician Assistant

## 2020-02-12 ENCOUNTER — Encounter: Payer: Self-pay | Admitting: Physician Assistant

## 2020-02-12 ENCOUNTER — Other Ambulatory Visit: Payer: Self-pay

## 2020-02-12 VITALS — BP 130/74 | HR 59 | Temp 97.0°F | Wt 157.0 lb

## 2020-02-12 DIAGNOSIS — Z79899 Other long term (current) drug therapy: Secondary | ICD-10-CM | POA: Diagnosis not present

## 2020-02-12 DIAGNOSIS — I10 Essential (primary) hypertension: Secondary | ICD-10-CM | POA: Diagnosis not present

## 2020-02-12 DIAGNOSIS — M48062 Spinal stenosis, lumbar region with neurogenic claudication: Secondary | ICD-10-CM

## 2020-02-12 DIAGNOSIS — I872 Venous insufficiency (chronic) (peripheral): Secondary | ICD-10-CM

## 2020-02-12 DIAGNOSIS — E538 Deficiency of other specified B group vitamins: Secondary | ICD-10-CM

## 2020-02-12 MED ORDER — GABAPENTIN 300 MG PO CAPS: 300.0000 mg | ORAL_CAPSULE | Freq: Three times a day (TID) | ORAL | 2 refills | Status: DC

## 2020-02-12 NOTE — Progress Notes (Signed)
Hospital follow up  Assessment and Plan:  B12 deficiency Will check with some paresthesias and memory changes Can be due to anesthesia as well but will check B12 -     Vitamin B12  Essential hypertension -     TSH - will cut back on her metoprolol to 12.5 mg BID Monitor BP  Close follow up 4 weeks - continue medications, DASH diet, exercise and monitor at home. Call if greater than 130/80.   Medication management -     CBC with Differential/Platelet -     COMPLETE METABOLIC PANEL WITH GFR -     Magnesium  Chronic venous insufficiency Wear compression socks  No redness, warmth, pain, neg homen's  But with recent surgery and estrogen, discussed patient is at high risk for DVT Declines [place] at this time, strict return/ER precautions discussed Will get back on bASA  Spinal stenosis of lumbar region with neurogenic claudication S/p surgery Follow up ortho -     gabapentin (NEURONTIN) 300 MG capsule; Take 1 capsule (300 mg total) by mouth 3 (three) times daily.   All medications were reviewed with patient and family and fully reconciled. All questions answered fully, and patient and family members were encouraged to call the office with any further questions or concerns. Discussed goal to avoid readmission related to this diagnosis.  Medications Discontinued During This Encounter  Medication Reason   bisoprolol (ZEBETA) 5 MG tablet    busPIRone (BUSPAR) 10 MG tablet    pregabalin (LYRICA) 50 MG capsule    clotrimazole-betamethasone (LOTRISONE) cream    aspirin 325 MG tablet    hydrochlorothiazide (HYDRODIURIL) 25 MG tablet    gabapentin (NEURONTIN) 300 MG capsule     CAN NOT DO FOR BCBS REGULAR OR MEDICARE Over 40 minutes of exam, counseling, chart review, and complex, high/moderate level critical decision making was performed this visit.   Future Appointments  Date Time Provider [place]  06/04/2020  2:00 PM Unk Pinto, MD [facility] None  11/11/2020   2:00 PM Liane Comber, NP [facility] None     HPI 74 y.o.female presents for follow up for transition from recent hospitalization or SNIF stay. Admit date to the hospital was  , patient was discharged from the hospital on   and our clinical staff contacted the office the day after discharge to set up a follow up appointment. The discharge summary, medications, and diagnostic test results were reviewed before meeting with the patient. The patient was admitted for:   She had surgery 05/13 for lower back, follows with [facility] for that however in rehab and recently her BP has been low. She will have some dizziness with walking, some fatigue   She is not on HCTZ at this time, has been on it in the past as needed for swelling. She normally has swelling in the left leg but she has had more in the right leg. She denies pain in the left leg, redness, warmth, negative hard cord. She did wear compression socks in the hospital but has not recently. She is on estrogen, she is not on ASA.    Home health is involved.   Images while in the hospital: DG Epidural/Nerve Root  Result Date: 11/12/2019 CLINICAL DATA:  Spinal curvature. Left leg radicular pain. Left-sided thoracolumbar region injection requested. EXAM: LUMBAR EPIDURAL INJECTION DIAGNOSTIC EPIDURAL INJECTION THERAPEUTIC EPIDURAL INJECTION COMPARISON:  MRI 09/01/2019 PROCEDURE: The procedure, risks, benefits, and alternatives were explained to the patient. Questions regarding the procedure were encouraged and answered. The  patient understands and consents to the procedure. LUMBAR EPIDURAL INJECTION An interlaminar approach was performed on the left at T12-L1. The overlying skin was cleansed with Betadine, draped in sterile fashion, and anesthetized using 1% Lidocaine. A 20 gauge spinal needle was advanced using loss-of-resistance technique. DIAGNOSTIC EPIDURAL INJECTION Injection of Omnipaque 180 shows a good epidural pattern with spread above and below the  level of needle placement, primarily on the left, but to both sides. No vascular opacification is seen. Pattern of spread seems normal. THERAPEUTIC EPIDURAL INJECTION One hundred twentymg of Depo-Medrol mixed with 2.5 cc 1% lidocaine were instilled. The procedure was well-tolerated, and the patient was discharged thirty minutes following the injection in good condition. FLUOROSCOPY TIME:  0 minutes 50 seconds. 24.55 micro gray meter squared IMPRESSION: Technically successful initial epidural injection on the left at T12-L1. Electronically Signed   By: Nelson Chimes M.D.   On: 11/12/2019 13:36     Current Outpatient Medications (Endocrine & Metabolic):    estradiol (ESTRACE) 0.5 MG tablet, TAKE 1 TABLET BY MOUTH EVERY DAY   progesterone (PROMETRIUM) 100 MG capsule, TAKE ONE CAPSULE BY MOUTH DAILY  Current Outpatient Medications (Cardiovascular):    metoprolol tartrate (LOPRESSOR) 12.5 mg TABS tablet, Take 12.5 mg by mouth 2 (two) times daily.   pravastatin (PRAVACHOL) 40 MG tablet, Take 1 tablet at Bedtime for Cholesterol  Current Outpatient Medications (Respiratory):    fexofenadine (ALLEGRA) 180 MG tablet, Take 180 mg by mouth daily.  Current Outpatient Medications (Analgesics):    celecoxib (CELEBREX) 200 MG capsule, Take 200 mg by mouth 2 (two) times daily.   HYDROcodone-acetaminophen (NORCO/VICODIN) 5-325 MG tablet, Take 1 tablet by mouth every 6 (six) hours as needed for moderate pain.   rizatriptan (MAXALT) 10 MG tablet, Take 1 tablet for Migraine & may repeat 1 x in 2 hrs - ((Maximum 2 tablets  /24 hours))   Current Outpatient Medications (Other):    ALPRAZolam (XANAX) 0.25 MG tablet, Take 1/2 to 1 tablet 2 to 3 x / day ONLY if needed for Acute Anxiety Attacks  & please try to limit to 5 days / week to avoid addiction   Ascorbic Acid (VITAMIN C PO), 500 mg 2 (two) times daily.   Cholecalciferol (VITAMIN D PO), Take by mouth. Takes 10000 to 12000 units daily.   diclofenac  sodium (VOLTAREN) 1 % GEL, Apply 2 g topically 4 (four) times daily.   DULoxetine (CYMBALTA) 60 MG capsule, TAKE 1 CAPSULE BY MOUTH EVERY DAY   tizanidine (ZANAFLEX) 2 MG capsule, Take 1 capsule (2 mg total) by mouth 3 (three) times daily as needed for muscle spasms.   zinc gluconate 50 MG tablet, Take 50 mg by mouth daily.   gabapentin (NEURONTIN) 300 MG capsule, Take 1 capsule (300 mg total) by mouth 3 (three) times daily.  Past Medical History:  Diagnosis Date   Allergy    Anemia    Anxiety    Depression    Hyperlipidemia    Hypertension    Lumbar stenosis    Migraines    Prediabetes      Allergies  Allergen Reactions   Augmentin [Amoxicillin-Pot Clavulanate] Diarrhea   Doxycycline Nausea Only   Egg White (Diagnostic) Nausea Only   Prozac [Fluoxetine Hcl] Other (See Comments)    Disoriented    ROS: all negative except above.   Physical Exam: Filed Weights   02/12/20 1132  Weight: 157 lb (71.2 kg)   BP 130/74    Pulse (!) 59  Temp (!) 97 F (36.1 C)    Wt 157 lb (71.2 kg)    SpO2 100%    BMI 28.72 kg/m  General Appearance: Well nourished, in no apparent distress. Eyes: PERRLA, EOMs, conjunctiva no swelling or erythema Sinuses: No Frontal/maxillary tenderness ENT/Mouth: Ext aud canals clear, TMs without erythema, bulging. No erythema, swelling, or exudate on post pharynx.  Tonsils not swollen or erythematous. Hearing decreased Neck: Supple, thyroid normal.  Respiratory: Respiratory effort normal, BS equal bilaterally without rales, rhonchi, wheezing or stridor.  Cardio: RRR with no MRGs. Brisk peripheral pulses without edema.  Abdomen: Soft, + BS.  Non tender, no guarding, rebound, hernias, masses. Lymphatics: Non tender without lymphadenopathy.  Musculoskeletal: Full ROM, 5/5 strength, gait slow, antalgic with cane.  Skin: Well healing surgical scar.  Warm, dry without rashes, lesions, ecchymosis.  Neuro: Cranial nerves intact. Normal muscle tone,  no cerebellar symptoms.  Psych: Awake and oriented X 3, normal affect, Insight and Judgment appropriate.     Vicie Mutters, PA-C 12:49 PM [facility]

## 2020-02-12 NOTE — Patient Instructions (Addendum)
The metoprolol 25 mg that you are on cut in half- take 1/2 pill or 12.5 mg twice daily.  Monitor your BP  Stop the zinc  Will check a B12 on you  Contact a health care provider if: Your edema does not get better with treatment. You have heart, liver, or kidney disease and have symptoms of edema. You have sudden and unexplained weight gain. Get help right away if: You develop shortness of breath or chest pain. You cannot breathe when you lie down. You develop pain, redness, or warmth in the swollen areas. You have heart, liver, or kidney disease and suddenly get edema. You have a fever and your symptoms suddenly get worse.  COMPRESSION STOCKINGS  HOME CARE INSTRUCTIONS   Do not stand or sit in one position for long periods of time. Do not sit with your legs crossed. Rest with your legs raised during the day.  Your legs have to be higher than your heart so that gravity will force the valves to open, so please really elevate your legs.   Wear elastic stockings or support hose. Do not wear other tight, encircling garments around the legs, pelvis, or waist.  [employer]ER[employer]  has a wide variety of well priced compression stockings. [place], [place] [id number] [id number] [place] has a good cheap selection, I like the socks, they are not as hard to get on  Walk as much as possible to increase blood flow.  Raise the foot of your bed at night with 2-inch blocks. SEEK MEDICAL CARE IF:   The skin around your ankle starts to break down.  You have pain, redness, tenderness, or hard swelling developing in your leg over a vein.  You are uncomfortable due to leg pain. Document Released: 05/11/2005 Document Revised: 10/24/2011 Document Reviewed: 09/27/2010 [facility] Patient Information 2014 [place].    Edema  Edema is an abnormal buildup of fluids in the body tissues and under the skin. Swelling of the legs, feet, and ankles is a common symptom that becomes  more likely as you get older. Swelling is also common in looser tissues, like around the eyes. When the affected area is squeezed, the fluid may move out of that spot and leave a dent for a few moments. This dent is called pitting edema. There are many possible causes of edema. Eating too much salt (sodium) and being on your feet or sitting for a long time can cause edema in your legs, feet, and ankles. Hot weather may make edema worse. Common causes of edema include: Heart failure. Liver or kidney disease. Weak leg blood vessels. Cancer. An injury. Pregnancy. Medicines. Being obese. Low protein levels in the blood. Edema is usually painless. Your skin may look swollen or shiny. Follow these instructions at home: Keep the affected body part raised (elevated) above the level of your heart when you are sitting or lying down. Do not sit still or stand for long periods of time. Do not wear tight clothing. Do not wear garters on your upper legs. Exercise your legs to get your circulation going. This helps to move the fluid back into your blood vessels, and it may help the swelling go down. Wear elastic bandages or support stockings to reduce swelling as told by your health care provider. Eat a low-salt (low-sodium) diet to reduce fluid as told by your health care provider. Depending on the cause of your swelling, you may need to limit how much fluid you  drink (fluid restriction). Take over-the-counter and prescription medicines only as told by your health care provider.  Summary Edema is an abnormal buildup of fluids in the body tissues and under the skin. Eating too much salt (sodium) and being on your feet or sitting for a long time can cause edema in your legs, feet, and ankles. Keep the affected body part raised (elevated) above the level of your heart when you are sitting or lying down. This information is not intended to replace advice given to you by your health care provider. Make sure  you discuss any questions you have with your health care provider. Document Revised: 12/19/2018 Document Reviewed: 09/03/2016 Elsevier Patient Education  [place].

## 2020-02-13 DIAGNOSIS — M4125 Other idiopathic scoliosis, thoracolumbar region: Secondary | ICD-10-CM | POA: Diagnosis not present

## 2020-02-13 DIAGNOSIS — F411 Generalized anxiety disorder: Secondary | ICD-10-CM | POA: Diagnosis not present

## 2020-02-13 DIAGNOSIS — Z4782 Encounter for orthopedic aftercare following scoliosis surgery: Secondary | ICD-10-CM | POA: Diagnosis not present

## 2020-02-13 DIAGNOSIS — F329 Major depressive disorder, single episode, unspecified: Secondary | ICD-10-CM | POA: Diagnosis not present

## 2020-02-13 DIAGNOSIS — I10 Essential (primary) hypertension: Secondary | ICD-10-CM | POA: Diagnosis not present

## 2020-02-13 DIAGNOSIS — R41841 Cognitive communication deficit: Secondary | ICD-10-CM | POA: Diagnosis not present

## 2020-02-13 LAB — COMPLETE METABOLIC PANEL WITH GFR
AG Ratio: 1.9 (calc) (ref 1.0–2.5)
ALT: 8 U/L (ref 6–29)
AST: 16 U/L (ref 10–35)
Albumin: 4 g/dL (ref 3.6–5.1)
Alkaline phosphatase (APISO): 80 U/L (ref 37–153)
BUN: 19 mg/dL (ref 7–25)
CO2: 26 mmol/L (ref 20–32)
Calcium: 9.6 mg/dL (ref 8.6–10.4)
Chloride: 107 mmol/L (ref 98–110)
Creat: 0.86 mg/dL (ref 0.60–0.93)
GFR, Est African American: 78 mL/min/{1.73_m2} (ref >=60)
GFR, Est Non African American: 67 mL/min/{1.73_m2} (ref >=60)
Globulin: 2.1 g/dL (calc) (ref 1.9–3.7)
Glucose, Bld: 82 mg/dL (ref 65–99)
Potassium: 5.1 mmol/L (ref 3.5–5.3)
Sodium: 143 mmol/L (ref 135–146)
Total Bilirubin: 0.5 mg/dL (ref 0.2–1.2)
Total Protein: 6.1 g/dL (ref 6.1–8.1)

## 2020-02-13 LAB — TSH: TSH: 1.78 mIU/L (ref 0.40–4.50)

## 2020-02-13 LAB — CBC WITH DIFFERENTIAL/PLATELET
Absolute Monocytes: 745 cells/uL (ref 200–950)
Basophils Absolute: 49 cells/uL (ref 0–200)
Basophils Relative: 0.5 %
Eosinophils Absolute: 274 cells/uL (ref 15–500)
Eosinophils Relative: 2.8 %
HCT: 36.2 % (ref 35.0–45.0)
Hemoglobin: 11.8 g/dL (ref 11.7–15.5)
Lymphs Abs: 3126 cells/uL (ref 850–3900)
MCH: 30.3 pg (ref 27.0–33.0)
MCHC: 32.6 g/dL (ref 32.0–36.0)
MCV: 93.1 fL (ref 80.0–100.0)
MPV: 10.7 fL (ref 7.5–12.5)
Monocytes Relative: 7.6 %
Neutro Abs: 5606 cells/uL (ref 1500–7800)
Neutrophils Relative %: 57.2 %
Platelets: 316 10*3/uL (ref 140–400)
RBC: 3.89 10*6/uL (ref 3.80–5.10)
RDW: 12.9 % (ref 11.0–15.0)
Total Lymphocyte: 31.9 %
WBC: 9.8 10*3/uL (ref 3.8–10.8)

## 2020-02-13 LAB — MAGNESIUM: Magnesium: 2 mg/dL (ref 1.5–2.5)

## 2020-02-13 LAB — VITAMIN B12: Vitamin B-12: 353 pg/mL (ref 200–1100)

## 2020-02-14 DIAGNOSIS — Z4782 Encounter for orthopedic aftercare following scoliosis surgery: Secondary | ICD-10-CM | POA: Diagnosis not present

## 2020-02-14 DIAGNOSIS — R41841 Cognitive communication deficit: Secondary | ICD-10-CM | POA: Diagnosis not present

## 2020-02-14 DIAGNOSIS — M4125 Other idiopathic scoliosis, thoracolumbar region: Secondary | ICD-10-CM | POA: Diagnosis not present

## 2020-02-14 DIAGNOSIS — I10 Essential (primary) hypertension: Secondary | ICD-10-CM | POA: Diagnosis not present

## 2020-02-14 DIAGNOSIS — F411 Generalized anxiety disorder: Secondary | ICD-10-CM | POA: Diagnosis not present

## 2020-02-14 DIAGNOSIS — F329 Major depressive disorder, single episode, unspecified: Secondary | ICD-10-CM | POA: Diagnosis not present

## 2020-02-18 ENCOUNTER — Other Ambulatory Visit: Payer: Self-pay | Admitting: Physician Assistant

## 2020-02-18 DIAGNOSIS — F329 Major depressive disorder, single episode, unspecified: Secondary | ICD-10-CM | POA: Diagnosis not present

## 2020-02-18 DIAGNOSIS — M4125 Other idiopathic scoliosis, thoracolumbar region: Secondary | ICD-10-CM | POA: Diagnosis not present

## 2020-02-18 DIAGNOSIS — F411 Generalized anxiety disorder: Secondary | ICD-10-CM | POA: Diagnosis not present

## 2020-02-18 DIAGNOSIS — R41841 Cognitive communication deficit: Secondary | ICD-10-CM | POA: Diagnosis not present

## 2020-02-18 DIAGNOSIS — I872 Venous insufficiency (chronic) (peripheral): Secondary | ICD-10-CM

## 2020-02-18 DIAGNOSIS — I10 Essential (primary) hypertension: Secondary | ICD-10-CM | POA: Diagnosis not present

## 2020-02-18 DIAGNOSIS — Z4782 Encounter for orthopedic aftercare following scoliosis surgery: Secondary | ICD-10-CM | POA: Diagnosis not present

## 2020-02-18 MED ORDER — HYDROCHLOROTHIAZIDE 25 MG PO TABS: ORAL_TABLET | ORAL | 1 refills | Status: DC

## 2020-02-18 MED ORDER — MEDICAL COMPRESSION STOCKINGS MISC: 0 refills | Status: DC

## 2020-02-18 NOTE — Progress Notes (Signed)
Future Appointments  Date Time Provider [place]  03/19/2020  9:30 AM Vicie Mutters, PA-C [facility] None  06/04/2020  2:00 PM Unk Pinto, MD [facility] None  11/11/2020  2:00 PM Liane Comber, NP [facility] None   [facility]/ [facility]IE----HAS REQUESTED A PHONE CALL FROM THE PROVIDER,   STATES THE PATINET IS HAVE SWELLING IN ANKLES, & WOULD LIKE TO SPEAK WITH THE PROVIDER.    I spoke with Lorie with [facility]. She states Rebekah Hamilton legs have gotten more swollen, worse at the end of the day, no 1-2 pitted edema, no redness, no warmth and equal bilateral.  Will restart HCTZ that she was on before her hospitliziation and send in compression stockings for her to wear.   The patient was advised to call immediately if she has any concerning symptoms in the interval. The patient voices understanding of current treatment options and is in agreement with the current care plan.The patient knows to call the clinic with any problems, questions or concerns or go to the ER if any further progression of symptoms.

## 2020-02-19 ENCOUNTER — Telehealth: Payer: Self-pay | Admitting: *Deleted

## 2020-02-19 DIAGNOSIS — I10 Essential (primary) hypertension: Secondary | ICD-10-CM

## 2020-02-19 DIAGNOSIS — M4125 Other idiopathic scoliosis, thoracolumbar region: Secondary | ICD-10-CM | POA: Diagnosis not present

## 2020-02-19 DIAGNOSIS — Z4782 Encounter for orthopedic aftercare following scoliosis surgery: Secondary | ICD-10-CM | POA: Diagnosis not present

## 2020-02-19 DIAGNOSIS — F411 Generalized anxiety disorder: Secondary | ICD-10-CM | POA: Diagnosis not present

## 2020-02-19 DIAGNOSIS — R609 Edema, unspecified: Secondary | ICD-10-CM

## 2020-02-19 DIAGNOSIS — R41841 Cognitive communication deficit: Secondary | ICD-10-CM | POA: Diagnosis not present

## 2020-02-19 DIAGNOSIS — F329 Major depressive disorder, single episode, unspecified: Secondary | ICD-10-CM | POA: Diagnosis not present

## 2020-02-19 NOTE — Telephone Encounter (Signed)
Nurse from [facility] called. Concerned about bilateral lower extremity edema. Patient BP was low at her visit at 100/?Marland Kitchen Asked if a referral to cardiology is appropriate, due to edema. Patient was advised of MY Chart message from earlier today and her labs.

## 2020-02-20 ENCOUNTER — Telehealth: Payer: Self-pay | Admitting: *Deleted

## 2020-02-20 DIAGNOSIS — R41841 Cognitive communication deficit: Secondary | ICD-10-CM | POA: Diagnosis not present

## 2020-02-20 DIAGNOSIS — F329 Major depressive disorder, single episode, unspecified: Secondary | ICD-10-CM | POA: Diagnosis not present

## 2020-02-20 DIAGNOSIS — Z4782 Encounter for orthopedic aftercare following scoliosis surgery: Secondary | ICD-10-CM | POA: Diagnosis not present

## 2020-02-20 DIAGNOSIS — I10 Essential (primary) hypertension: Secondary | ICD-10-CM | POA: Diagnosis not present

## 2020-02-20 DIAGNOSIS — F411 Generalized anxiety disorder: Secondary | ICD-10-CM | POA: Diagnosis not present

## 2020-02-20 DIAGNOSIS — M4125 Other idiopathic scoliosis, thoracolumbar region: Secondary | ICD-10-CM | POA: Diagnosis not present

## 2020-02-20 NOTE — Addendum Note (Signed)
Addended by: Vicie Mutters R on: 02/20/2020 07:55 AM   Modules accepted: Orders

## 2020-02-20 NOTE — Telephone Encounter (Signed)
Tell her to cut back on the metoprolol to 1/2 pill twice a day if her BP remains low.  Get compression stockings as discussed.  Will send in referral to cardiology for possible echocardiogram. Patient has not had previoiusly. S/p surgery having worsening swelling bilateral legs.   She has follow up in 1 month but can make an appointment sooner to see any provider here if needed.

## 2020-02-20 NOTE — Telephone Encounter (Signed)
Patient is aware of instructions, per Vicie Mutters, PA, and was advised to call for an appointment, if needed before her 03/19/2020 scheduled appointment.

## 2020-02-24 DIAGNOSIS — M4125 Other idiopathic scoliosis, thoracolumbar region: Secondary | ICD-10-CM | POA: Diagnosis not present

## 2020-02-24 DIAGNOSIS — Z4782 Encounter for orthopedic aftercare following scoliosis surgery: Secondary | ICD-10-CM | POA: Diagnosis not present

## 2020-02-24 DIAGNOSIS — I10 Essential (primary) hypertension: Secondary | ICD-10-CM | POA: Diagnosis not present

## 2020-02-24 DIAGNOSIS — R41841 Cognitive communication deficit: Secondary | ICD-10-CM | POA: Diagnosis not present

## 2020-02-24 DIAGNOSIS — F411 Generalized anxiety disorder: Secondary | ICD-10-CM | POA: Diagnosis not present

## 2020-02-24 DIAGNOSIS — F329 Major depressive disorder, single episode, unspecified: Secondary | ICD-10-CM | POA: Diagnosis not present

## 2020-02-25 ENCOUNTER — Ambulatory Visit: Payer: Medicare Other | Admitting: Cardiovascular Disease

## 2020-02-26 DIAGNOSIS — Z4782 Encounter for orthopedic aftercare following scoliosis surgery: Secondary | ICD-10-CM | POA: Diagnosis not present

## 2020-02-26 DIAGNOSIS — F411 Generalized anxiety disorder: Secondary | ICD-10-CM | POA: Diagnosis not present

## 2020-02-26 DIAGNOSIS — M4125 Other idiopathic scoliosis, thoracolumbar region: Secondary | ICD-10-CM | POA: Diagnosis not present

## 2020-02-26 DIAGNOSIS — F329 Major depressive disorder, single episode, unspecified: Secondary | ICD-10-CM | POA: Diagnosis not present

## 2020-02-26 DIAGNOSIS — R41841 Cognitive communication deficit: Secondary | ICD-10-CM | POA: Diagnosis not present

## 2020-02-26 DIAGNOSIS — I10 Essential (primary) hypertension: Secondary | ICD-10-CM | POA: Diagnosis not present

## 2020-02-27 MED ORDER — CELECOXIB 200 MG PO CAPS: 200.0000 mg | ORAL_CAPSULE | Freq: Two times a day (BID) | ORAL | 2 refills | Status: DC

## 2020-03-02 DIAGNOSIS — M4125 Other idiopathic scoliosis, thoracolumbar region: Secondary | ICD-10-CM | POA: Diagnosis not present

## 2020-03-02 DIAGNOSIS — F329 Major depressive disorder, single episode, unspecified: Secondary | ICD-10-CM | POA: Diagnosis not present

## 2020-03-02 DIAGNOSIS — R41841 Cognitive communication deficit: Secondary | ICD-10-CM | POA: Diagnosis not present

## 2020-03-02 DIAGNOSIS — Z4782 Encounter for orthopedic aftercare following scoliosis surgery: Secondary | ICD-10-CM | POA: Diagnosis not present

## 2020-03-02 DIAGNOSIS — I10 Essential (primary) hypertension: Secondary | ICD-10-CM | POA: Diagnosis not present

## 2020-03-02 DIAGNOSIS — F411 Generalized anxiety disorder: Secondary | ICD-10-CM | POA: Diagnosis not present

## 2020-03-03 DIAGNOSIS — R41841 Cognitive communication deficit: Secondary | ICD-10-CM | POA: Diagnosis not present

## 2020-03-03 DIAGNOSIS — M4125 Other idiopathic scoliosis, thoracolumbar region: Secondary | ICD-10-CM | POA: Diagnosis not present

## 2020-03-03 DIAGNOSIS — I10 Essential (primary) hypertension: Secondary | ICD-10-CM | POA: Diagnosis not present

## 2020-03-03 DIAGNOSIS — F329 Major depressive disorder, single episode, unspecified: Secondary | ICD-10-CM | POA: Diagnosis not present

## 2020-03-03 DIAGNOSIS — F411 Generalized anxiety disorder: Secondary | ICD-10-CM | POA: Diagnosis not present

## 2020-03-03 DIAGNOSIS — Z4782 Encounter for orthopedic aftercare following scoliosis surgery: Secondary | ICD-10-CM | POA: Diagnosis not present

## 2020-03-03 MED ORDER — METOPROLOL TARTRATE 25 MG PO TABS
12.5000 mg | ORAL_TABLET | Freq: Two times a day (BID) | ORAL | 3 refills | Status: DC
Start: 2020-03-03 — End: 2021-11-15

## 2020-03-04 DIAGNOSIS — Z4782 Encounter for orthopedic aftercare following scoliosis surgery: Secondary | ICD-10-CM | POA: Diagnosis not present

## 2020-03-04 DIAGNOSIS — F411 Generalized anxiety disorder: Secondary | ICD-10-CM | POA: Diagnosis not present

## 2020-03-04 DIAGNOSIS — M4125 Other idiopathic scoliosis, thoracolumbar region: Secondary | ICD-10-CM | POA: Diagnosis not present

## 2020-03-04 DIAGNOSIS — F329 Major depressive disorder, single episode, unspecified: Secondary | ICD-10-CM | POA: Diagnosis not present

## 2020-03-04 DIAGNOSIS — R41841 Cognitive communication deficit: Secondary | ICD-10-CM | POA: Diagnosis not present

## 2020-03-04 DIAGNOSIS — I10 Essential (primary) hypertension: Secondary | ICD-10-CM | POA: Diagnosis not present

## 2020-03-05 DIAGNOSIS — D649 Anemia, unspecified: Secondary | ICD-10-CM | POA: Diagnosis not present

## 2020-03-05 DIAGNOSIS — F329 Major depressive disorder, single episode, unspecified: Secondary | ICD-10-CM | POA: Diagnosis not present

## 2020-03-05 DIAGNOSIS — R41841 Cognitive communication deficit: Secondary | ICD-10-CM | POA: Diagnosis not present

## 2020-03-05 DIAGNOSIS — F411 Generalized anxiety disorder: Secondary | ICD-10-CM | POA: Diagnosis not present

## 2020-03-05 DIAGNOSIS — M4125 Other idiopathic scoliosis, thoracolumbar region: Secondary | ICD-10-CM | POA: Diagnosis not present

## 2020-03-05 DIAGNOSIS — G43909 Migraine, unspecified, not intractable, without status migrainosus: Secondary | ICD-10-CM | POA: Diagnosis not present

## 2020-03-05 DIAGNOSIS — Z4782 Encounter for orthopedic aftercare following scoliosis surgery: Secondary | ICD-10-CM | POA: Diagnosis not present

## 2020-03-05 DIAGNOSIS — I10 Essential (primary) hypertension: Secondary | ICD-10-CM | POA: Diagnosis not present

## 2020-03-05 DIAGNOSIS — Z9181 History of falling: Secondary | ICD-10-CM | POA: Diagnosis not present

## 2020-03-10 ENCOUNTER — Ambulatory Visit: Payer: Medicare Other | Admitting: Cardiovascular Disease

## 2020-03-11 DIAGNOSIS — F411 Generalized anxiety disorder: Secondary | ICD-10-CM | POA: Diagnosis not present

## 2020-03-11 DIAGNOSIS — Z4782 Encounter for orthopedic aftercare following scoliosis surgery: Secondary | ICD-10-CM | POA: Diagnosis not present

## 2020-03-11 DIAGNOSIS — F329 Major depressive disorder, single episode, unspecified: Secondary | ICD-10-CM | POA: Diagnosis not present

## 2020-03-11 DIAGNOSIS — R41841 Cognitive communication deficit: Secondary | ICD-10-CM | POA: Diagnosis not present

## 2020-03-11 DIAGNOSIS — I10 Essential (primary) hypertension: Secondary | ICD-10-CM | POA: Diagnosis not present

## 2020-03-11 DIAGNOSIS — M4125 Other idiopathic scoliosis, thoracolumbar region: Secondary | ICD-10-CM | POA: Diagnosis not present

## 2020-03-12 DIAGNOSIS — Z4782 Encounter for orthopedic aftercare following scoliosis surgery: Secondary | ICD-10-CM | POA: Diagnosis not present

## 2020-03-12 DIAGNOSIS — M4125 Other idiopathic scoliosis, thoracolumbar region: Secondary | ICD-10-CM | POA: Diagnosis not present

## 2020-03-12 DIAGNOSIS — F411 Generalized anxiety disorder: Secondary | ICD-10-CM | POA: Diagnosis not present

## 2020-03-12 DIAGNOSIS — R41841 Cognitive communication deficit: Secondary | ICD-10-CM | POA: Diagnosis not present

## 2020-03-12 DIAGNOSIS — F329 Major depressive disorder, single episode, unspecified: Secondary | ICD-10-CM | POA: Diagnosis not present

## 2020-03-12 DIAGNOSIS — I10 Essential (primary) hypertension: Secondary | ICD-10-CM | POA: Diagnosis not present

## 2020-03-16 DIAGNOSIS — F411 Generalized anxiety disorder: Secondary | ICD-10-CM | POA: Diagnosis not present

## 2020-03-16 DIAGNOSIS — M4125 Other idiopathic scoliosis, thoracolumbar region: Secondary | ICD-10-CM | POA: Diagnosis not present

## 2020-03-16 DIAGNOSIS — F329 Major depressive disorder, single episode, unspecified: Secondary | ICD-10-CM | POA: Diagnosis not present

## 2020-03-16 DIAGNOSIS — I10 Essential (primary) hypertension: Secondary | ICD-10-CM | POA: Diagnosis not present

## 2020-03-16 DIAGNOSIS — Z4782 Encounter for orthopedic aftercare following scoliosis surgery: Secondary | ICD-10-CM | POA: Diagnosis not present

## 2020-03-16 DIAGNOSIS — R41841 Cognitive communication deficit: Secondary | ICD-10-CM | POA: Diagnosis not present

## 2020-03-17 DIAGNOSIS — F329 Major depressive disorder, single episode, unspecified: Secondary | ICD-10-CM | POA: Diagnosis not present

## 2020-03-17 DIAGNOSIS — F411 Generalized anxiety disorder: Secondary | ICD-10-CM | POA: Diagnosis not present

## 2020-03-17 DIAGNOSIS — M4125 Other idiopathic scoliosis, thoracolumbar region: Secondary | ICD-10-CM | POA: Diagnosis not present

## 2020-03-17 DIAGNOSIS — R41841 Cognitive communication deficit: Secondary | ICD-10-CM | POA: Diagnosis not present

## 2020-03-17 DIAGNOSIS — I10 Essential (primary) hypertension: Secondary | ICD-10-CM | POA: Diagnosis not present

## 2020-03-17 DIAGNOSIS — Z4782 Encounter for orthopedic aftercare following scoliosis surgery: Secondary | ICD-10-CM | POA: Diagnosis not present

## 2020-03-17 NOTE — Progress Notes (Signed)
Assessment and Plan: Essential hypertension At goal, continue the same  Acute left-sided low back pain without sciatica -     dexamethasone (DECADRON) injection 10 mg Continue PT Verbal consent obtained, risk and benefits were addressed with patient. A trigger point injection was performed at the site of maximal tenderness using 1% plain Lidocaine and Dexamethasone. This was well tolerated, and followed by immediate relief of pain. Return precautions discussed with the patient.  If not better will refer to ortho  Chronic venous insufficiency Swelling has improved Discussed venous stasis dermatitis with patient Continue compression socks  B12 deficiency Continue B12- check at CPE  Spinal stenosis of lumbar region with neurogenic claudication Gait and pain are doing better, surgical site well healed, continue PT as needed.   Future Appointments  Date Time Provider [place]  04/07/2020 10:40 AM O'Neal, Cassie Freer, MD [facility]  06/04/2020  2:00 PM Unk Pinto, MD [facility] None  11/11/2020  2:00 PM Liane Comber, NP [facility] None    HPI 74 y.o.female presents for 1 month follow up from hospital follow up visit.  She had surgery 05/13 on her lower back. She had memory issues, leg swelling and hypotension last visit.   She still does not recall her hospital stay, and has no recollection of 2-3 weeks after the hospital. Her memory has improved and she is doing speech therapy. No vision, no HA, slurred speech, no focal weakness.   She was off her HCTZ, and her metoprolol was cut back to 12.5mg  BID.  Her B12 was low last visit so she added on sublingual visit. Lab Results  Component Value Date   [id number] 02/12/2020    She had follow up with her surgeon, the xray was good, she continue to do PT. She is using a cane still for walking. She does not have to go back for 9 months.  She is having left buttocks pain. Worse with walking, better with  rest. No pain down her legs.   Blood pressure 120/72, pulse 72, temperature 98.4 F (36.9 C), weight 153 lb (69.4 kg), SpO2 96 %.    Patient Active Problem List   Diagnosis Date Noted  . Chronic venous insufficiency 05/07/2019  . History of 2019 novel coronavirus disease (COVID-19) 05/07/2019  . Chronic left-sided low back pain with left-sided sciatica 07/02/2018  . OAB (overactive bladder) 04/02/2018  . CKD (chronic kidney disease), symptom management only, stage 2 (mild) 09/24/2017  . Encounter for Medicare annual wellness exam 06/02/2015  . Glaucoma 06/02/2015  . Vitamin D deficiency 10/21/2014  . Medication management 12/01/2013  . Hyperlipidemia, mixed   . Essential hypertension   . Abnormal glucose   . Migraines   . Allergy   . Anxiety   . Depression, major, recurrent ([place])   . Lumbar stenosis      Current Outpatient Medications (Endocrine & Metabolic):  .  estradiol (ESTRACE) 0.5 MG tablet, TAKE 1 TABLET BY MOUTH EVERY DAY .  progesterone (PROMETRIUM) 100 MG capsule, TAKE ONE CAPSULE BY MOUTH DAILY  Current Outpatient Medications (Cardiovascular):  .  hydrochlorothiazide (HYDRODIURIL) 25 MG tablet, Take 1/2 -1 tablet daily for BP and fluid for goal <130/80. Marland Kitchen  metoprolol tartrate (LOPRESSOR) 25 MG tablet, Take 0.5 tablets (12.5 mg total) by mouth 2 (two) times daily. .  pravastatin (PRAVACHOL) 40 MG tablet, Take 1 tablet at Bedtime for Cholesterol  Current Outpatient Medications (Respiratory):  .  fexofenadine (ALLEGRA) 180 MG tablet, Take 180 mg by mouth daily.  Current  Outpatient Medications (Analgesics):  .  celecoxib (CELEBREX) 200 MG capsule, Take 1 capsule (200 mg total) by mouth 2 (two) times daily. Marland Kitchen  HYDROcodone-acetaminophen (NORCO/VICODIN) 5-325 MG tablet, Take 1 tablet by mouth every 6 (six) hours as needed for moderate pain. .  rizatriptan (MAXALT) 10 MG tablet, Take 1 tablet for Migraine & may repeat 1 x in 2 hrs - ((Maximum 2 tablets  /24  hours))   Current Outpatient Medications (Other):  Marland Kitchen  ALPRAZolam (XANAX) 0.25 MG tablet, Take 1/2 to 1 tablet 2 to 3 x / day ONLY if needed for Acute Anxiety Attacks  & please try to limit to 5 days / week to avoid addiction .  Ascorbic Acid (VITAMIN C PO), 500 mg 2 (two) times daily. .  Cholecalciferol (VITAMIN D PO), Take by mouth. Takes 10000 to 12000 units daily. .  diclofenac sodium (VOLTAREN) 1 % GEL, Apply 2 g topically 4 (four) times daily. .  DULoxetine (CYMBALTA) 60 MG capsule, TAKE 1 CAPSULE BY MOUTH EVERY DAY .  Elastic Bandages & Supports (MEDICAL COMPRESSION STOCKINGS) MISC, Knee high compression socks 20-30 pressure, measure and fit .  gabapentin (NEURONTIN) 300 MG capsule, Take 1 capsule (300 mg total) by mouth 3 (three) times daily. .  tizanidine (ZANAFLEX) 2 MG capsule, Take 1 capsule (2 mg total) by mouth 3 (three) times daily as needed for muscle spasms.  Allergies  Allergen Reactions  . Augmentin [Amoxicillin-Pot Clavulanate] Diarrhea  . Doxycycline Nausea Only  . Egg White (Diagnostic) Nausea Only  . Prozac [Fluoxetine Hcl] Other (See Comments)    Disoriented    ROS: all negative except above.   Physical Exam: Filed Weights   03/19/20 0918  Weight: 153 lb (69.4 kg)   BP 120/72   Pulse 72   Temp 98.4 F (36.9 C)   Wt 153 lb (69.4 kg)   SpO2 96%   BMI 27.98 kg/m  General Appearance: Well nourished, in no apparent distress. Eyes: PERRLA, EOMs, conjunctiva no swelling or erythema Sinuses: No Frontal/maxillary tenderness ENT/Mouth: Ext aud canals clear, TMs without erythema, bulging. No erythema, swelling, or exudate on post pharynx.  Tonsils not swollen or erythematous. Hearing decreased Neck: Supple, thyroid normal.  Respiratory: Respiratory effort normal, BS equal bilaterally without rales, rhonchi, wheezing or stridor.  Cardio: RRR with no MRGs. Brisk peripheral pulses without edema, some medial malleolus erythema/stasis dermatitis on right leg, not  tender, negative hard cord.  Abdomen: Soft, + BS.  Non tender, no guarding, rebound, hernias, masses. Lymphatics: Non tender without lymphadenopathy.  Musculoskeletal: Full ROM, 5/5 strength, gait slow, antalgic without cane. She has pin point left SI pain, FULL ROM, negative straight leg Skin: Well healing surgical scar.  Warm, dry without rashes, lesions, ecchymosis.  Neuro: Cranial nerves intact. Normal muscle tone, no cerebellar symptoms. Normal finger to nose. No pronator drift.  Psych: Awake and oriented X 3, normal affect, Insight and Judgment appropriate.      Vicie Mutters, PA-C 9:29 AM [facility]'

## 2020-03-18 DIAGNOSIS — M4186 Other forms of scoliosis, lumbar region: Secondary | ICD-10-CM | POA: Diagnosis not present

## 2020-03-18 DIAGNOSIS — M4314 Spondylolisthesis, thoracic region: Secondary | ICD-10-CM | POA: Diagnosis not present

## 2020-03-18 DIAGNOSIS — M4316 Spondylolisthesis, lumbar region: Secondary | ICD-10-CM | POA: Diagnosis not present

## 2020-03-18 DIAGNOSIS — M8588 Other specified disorders of bone density and structure, other site: Secondary | ICD-10-CM | POA: Diagnosis not present

## 2020-03-18 DIAGNOSIS — Z9889 Other specified postprocedural states: Secondary | ICD-10-CM | POA: Diagnosis not present

## 2020-03-18 DIAGNOSIS — Z981 Arthrodesis status: Secondary | ICD-10-CM | POA: Diagnosis not present

## 2020-03-19 ENCOUNTER — Ambulatory Visit (INDEPENDENT_AMBULATORY_CARE_PROVIDER_SITE_OTHER): Payer: Medicare Other | Admitting: Physician Assistant

## 2020-03-19 ENCOUNTER — Encounter: Payer: Self-pay | Admitting: Physician Assistant

## 2020-03-19 ENCOUNTER — Other Ambulatory Visit: Payer: Self-pay

## 2020-03-19 VITALS — BP 120/72 | HR 72 | Temp 98.4°F | Wt 153.0 lb

## 2020-03-19 DIAGNOSIS — I872 Venous insufficiency (chronic) (peripheral): Secondary | ICD-10-CM

## 2020-03-19 DIAGNOSIS — M545 Low back pain, unspecified: Secondary | ICD-10-CM

## 2020-03-19 DIAGNOSIS — E538 Deficiency of other specified B group vitamins: Secondary | ICD-10-CM | POA: Diagnosis not present

## 2020-03-19 DIAGNOSIS — I10 Essential (primary) hypertension: Secondary | ICD-10-CM

## 2020-03-19 DIAGNOSIS — M48062 Spinal stenosis, lumbar region with neurogenic claudication: Secondary | ICD-10-CM | POA: Diagnosis not present

## 2020-03-19 MED ORDER — DEXAMETHASONE SODIUM PHOSPHATE 100 MG/10ML IJ SOLN
10.0000 mg | Freq: Once | INTRAMUSCULAR | Status: AC
  Administered 2020-03-19: 10 mg via INTRAMUSCULAR

## 2020-03-19 NOTE — Patient Instructions (Signed)
Trigger Point Injection Trigger points are areas where you have pain. A trigger point injection is a shot given in the trigger point to help relieve pain for a few days to a few months. Common places for trigger points include:  The neck.  The shoulders.  The upper back.  The lower back. A trigger point injection will not cure long-term (chronic) pain permanently. These injections do not always work for every person. For some people, they can help to relieve pain for a few days to a few months. Tell a health care provider about:  Any allergies you have.  All medicines you are taking, including vitamins, herbs, eye drops, creams, and over-the-counter medicines.  Any problems you or family members have had with anesthetic medicines.  Any blood disorders you have.  Any surgeries you have had.  Any medical conditions you have. What are the risks? Generally, this is a safe procedure. However, problems may occur, including:  Infection.  Bleeding or bruising.  Allergic reaction to the injected medicine.  Irritation of the skin around the injection site. What happens before the procedure? Ask your health care provider about:  Changing or stopping your regular medicines. This is especially important if you are taking diabetes medicines or blood thinners.  Taking medicines such as aspirin and ibuprofen. These medicines can thin your blood. Do not take these medicines unless your health care provider tells you to take them.  Taking over-the-counter medicines, vitamins, herbs, and supplements. What happens during the procedure?   Your health care provider will feel for trigger points. A marker may be used to circle the area for the injection.  The skin over the trigger point will be washed with a germ-killing (antiseptic) solution.  A thin needle is used for the injection. You may feel pain or a twitching feeling when the needle enters the trigger point.  A numbing solution may  be injected into the trigger point. Sometimes a medicine to keep down inflammation is also injected.  Your health care provider may move the needle around the area where the trigger point is located until the tightness and twitching goes away.  After the injection, your health care provider may put gentle pressure over the injection site.  The injection site will be covered with a bandage (dressing). The procedure may vary among health care providers and hospitals. What can I expect after treatment? After treatment, you may have:  Soreness and stiffness for 1-2 days.  A dressing. This can be taken off in a few hours or as told by your health care provider. Follow these instructions at home: Injection site care  Remove your dressing as told by your health care provider.  Check your injection site every day for signs of infection. Check for: ? Redness, swelling, or pain. ? Fluid or blood. ? Warmth. ? Pus or a bad smell. Managing pain, stiffness, and swelling  If directed, put ice on the affected area. ? Put ice in a plastic bag. ? Place a towel between your skin and the bag. ? Leave the ice on for 20 minutes, 2-3 times a day. General instructions  If you were asked to stop your regular medicines, ask your health care provider when you may start taking them again.  Return to your normal activities as told by your health care provider. Ask your health care provider what activities are safe for you.  Do not take baths, swim, or use a hot tub until your health care provider approves.  You may be asked to see an occupational or physical therapist for exercises that reduce muscle strain and stretch the area of the trigger point.  Keep all follow-up visits as told by your health care provider. This is important. Contact a health care provider if:  Your pain comes back, and it is worse than before the injection. You may need more injections.  You have chills or a fever.  The  injection site becomes more painful, red, swollen, or warm to the touch. Summary  A trigger point injection is a shot given in the trigger point to help relieve pain for a few days to a few months.  Common places for trigger point injections are the neck, shoulder, upper back, and lower back.  These injections do not always work for every person, but for some people, the injections can help to relieve pain for a few days to a few months.  Contact a health care provider if symptoms come back or they are worse than before treatment. Also, get help if the injection site becomes more painful, red, swollen, or warm to the touch. This information is not intended to replace advice given to you by your health care provider. Make sure you discuss any questions you have with your health care provider. Document Revised: 09/12/2018 Document Reviewed: 09/12/2018 Elsevier Patient Education  2020 [place].   Stasis Dermatitis Stasis dermatitis is a long-term (chronic) skin condition that happens when veins can no longer pump blood back to the heart (poor circulation). This condition causes a red or brown scaly rash or sores (ulcers) from the pooling of blood (stasis). This condition usually affects the lower legs. It may affect one leg or both legs. Without treatment, severe stasis dermatitis can lead to other skin conditions and infections. What are the causes? This condition is caused by poor circulation. What increases the risk? You are more likely to develop this condition if:  You are not very active.  You stand for long periods of time.  You have veins that have become enlarged and twisted (varicose veins).  You have leg veins that are not strong enough to send blood back to the heart (venous insufficiency).  You have had a blood clot.  You have been pregnant many times.  You have had vein surgery.  You are obese.  You have heart or kidney failure.  You are 14 years of age or  older.  You have had injuries to your legs in the past. What are the signs or symptoms? Common early symptoms of this condition include:  Itchiness in one or both of your legs.  Swelling in your ankle or leg. This might get better overnight but be worse again during the day.  Skin that looks thin on your ankle and leg.  Red or brown marks that develop slowly.  Skin that is dry, cracked, or easily irritated.  Red, swollen skin that is sore or has a burning feeling.  An achy or heavy feeling after you walk or stand for long periods of time.  Pain. Later and more severe symptoms of this condition include:  Skin that looks shiny.  Small, open sores (ulcers). These are often red or purple and leak fluid.  Skin that feels hard.  Severe itching.  A change in the shape or color of your lower legs.  Severe pain.  Difficulty walking. How is this diagnosed? This condition may be diagnosed based on:  Your symptoms and medical history.  A physical exam.  You may also have tests, including:  Blood tests.  Imaging tests to check blood flow (Doppler ultrasound).  Allergy tests. You may need to see a health care provider who specializes in skin diseases (dermatologist). How is this treated? This condition may be treated with:  Compression stockings or an elastic wrap to improve circulation.  Medicines, such as: ? Corticosteroid creams and ointments. ? Non-corticosteroid medicines applied to the skin (topical). ? Medicine to reduce swelling in the legs (diuretics). ? Antibiotics. ? Medicine to relieve itching (antihistamines).  A bandage (dressing).  A wrap that contains zinc and gelatin (Unna boot). Follow these instructions at home: Skin care  Moisturize your skin as told by your health care provider. Do not use moisturizers with fragrance. This can irritate your skin.  Apply a cool, wet cloth (cool compress) to the affected areas.  Do not scratch your  skin.  Do not rub your skin dry after a bath or shower. Gently pat your skin dry.  Do not use scented soaps, detergents, or perfumes. Medicines  Take or use over-the-counter and prescription medicines only as told by your health care provider.  If you were prescribed an antibiotic medicine, take or use it as told by your health care provider. Do not stop taking or using the antibiotic even if your condition improves. Activity  Walk as told by your health care provider. Walking increases blood flow.  Do calf and ankle exercises throughout the day as told by your health care provider. This will help increase blood flow.  Raise (elevate) your legs above the level of your heart when you are sitting or lying down. Lifestyle  Work with your health care provider to lose weight, if needed.  Do not cross your legs when you sit.  Do not stand or sit in one position for long periods of time.  Wear comfortable, loose-fitting clothing. Circulation in your legs will be worse if you wear tight pants, belts, and waistbands.  Do not use any products that contain nicotine or tobacco, such as cigarettes, e-cigarettes, and chewing tobacco. If you need help quitting, ask your health care provider. General instructions  If you were asked to use one of the following to help with your condition, follow instructions from your health care provider on how to: ? Remove and change any dressing. ? Wear compression stockings. These stockings help to prevent blood clots and reduce swelling in your legs. ? Wear the [employer].  Keep all follow-up visits as told by your health care provider. This is important. Contact a health care provider if:  Your condition does not improve with treatment.  Your condition gets worse.  You have signs of infection in the affected area. Watch for: ? Swelling. ? Tenderness. ? Redness. ? Soreness. ? Warmth.  You have a fever. Get help right away if:  You notice red  streaks coming from the affected area.  Your bone or joint underneath the affected area becomes painful after the skin has healed.  The affected area turns darker.  You feel a deep pain in your leg or groin.  You are short of breath. Summary  Stasis dermatitis is a long-term (chronic) skin condition that happens when veins can no longer pump blood back to the heart (poor circulation).  Wear compression stockings as told by your health care provider. These stockings help to prevent blood clots and reduce swelling in your legs.  Follow instructions from your health care provider about activity, medicines, and lifestyle.  Contact a health care provider if you have a fever or have signs of infection in the affected area.  Keep all follow-up visits as told by your health care provider. This is important. This information is not intended to replace advice given to you by your health care provider. Make sure you discuss any questions you have with your health care provider. Document Revised: 01/01/2018 Document Reviewed: 01/01/2018 Elsevier Patient Education  [place].

## 2020-03-23 DIAGNOSIS — M4125 Other idiopathic scoliosis, thoracolumbar region: Secondary | ICD-10-CM | POA: Diagnosis not present

## 2020-03-23 DIAGNOSIS — F411 Generalized anxiety disorder: Secondary | ICD-10-CM | POA: Diagnosis not present

## 2020-03-23 DIAGNOSIS — F329 Major depressive disorder, single episode, unspecified: Secondary | ICD-10-CM | POA: Diagnosis not present

## 2020-03-23 DIAGNOSIS — R41841 Cognitive communication deficit: Secondary | ICD-10-CM | POA: Diagnosis not present

## 2020-03-23 DIAGNOSIS — Z4782 Encounter for orthopedic aftercare following scoliosis surgery: Secondary | ICD-10-CM | POA: Diagnosis not present

## 2020-03-23 DIAGNOSIS — I10 Essential (primary) hypertension: Secondary | ICD-10-CM | POA: Diagnosis not present

## 2020-03-26 DIAGNOSIS — I10 Essential (primary) hypertension: Secondary | ICD-10-CM | POA: Diagnosis not present

## 2020-03-26 DIAGNOSIS — F411 Generalized anxiety disorder: Secondary | ICD-10-CM | POA: Diagnosis not present

## 2020-03-26 DIAGNOSIS — Z4782 Encounter for orthopedic aftercare following scoliosis surgery: Secondary | ICD-10-CM | POA: Diagnosis not present

## 2020-03-26 DIAGNOSIS — R41841 Cognitive communication deficit: Secondary | ICD-10-CM | POA: Diagnosis not present

## 2020-03-26 DIAGNOSIS — M4125 Other idiopathic scoliosis, thoracolumbar region: Secondary | ICD-10-CM | POA: Diagnosis not present

## 2020-03-26 DIAGNOSIS — F329 Major depressive disorder, single episode, unspecified: Secondary | ICD-10-CM | POA: Diagnosis not present

## 2020-03-30 DIAGNOSIS — F329 Major depressive disorder, single episode, unspecified: Secondary | ICD-10-CM | POA: Diagnosis not present

## 2020-03-30 DIAGNOSIS — Z4782 Encounter for orthopedic aftercare following scoliosis surgery: Secondary | ICD-10-CM | POA: Diagnosis not present

## 2020-03-30 DIAGNOSIS — I10 Essential (primary) hypertension: Secondary | ICD-10-CM | POA: Diagnosis not present

## 2020-03-30 DIAGNOSIS — F411 Generalized anxiety disorder: Secondary | ICD-10-CM | POA: Diagnosis not present

## 2020-03-30 DIAGNOSIS — M4125 Other idiopathic scoliosis, thoracolumbar region: Secondary | ICD-10-CM | POA: Diagnosis not present

## 2020-03-30 DIAGNOSIS — R41841 Cognitive communication deficit: Secondary | ICD-10-CM | POA: Diagnosis not present

## 2020-03-31 DIAGNOSIS — Z4782 Encounter for orthopedic aftercare following scoliosis surgery: Secondary | ICD-10-CM | POA: Diagnosis not present

## 2020-03-31 DIAGNOSIS — M4125 Other idiopathic scoliosis, thoracolumbar region: Secondary | ICD-10-CM | POA: Diagnosis not present

## 2020-03-31 DIAGNOSIS — F329 Major depressive disorder, single episode, unspecified: Secondary | ICD-10-CM | POA: Diagnosis not present

## 2020-03-31 DIAGNOSIS — I10 Essential (primary) hypertension: Secondary | ICD-10-CM | POA: Diagnosis not present

## 2020-03-31 DIAGNOSIS — F411 Generalized anxiety disorder: Secondary | ICD-10-CM | POA: Diagnosis not present

## 2020-03-31 DIAGNOSIS — R41841 Cognitive communication deficit: Secondary | ICD-10-CM | POA: Diagnosis not present

## 2020-04-01 DIAGNOSIS — I10 Essential (primary) hypertension: Secondary | ICD-10-CM | POA: Diagnosis not present

## 2020-04-01 DIAGNOSIS — M4125 Other idiopathic scoliosis, thoracolumbar region: Secondary | ICD-10-CM | POA: Diagnosis not present

## 2020-04-01 DIAGNOSIS — R41841 Cognitive communication deficit: Secondary | ICD-10-CM | POA: Diagnosis not present

## 2020-04-01 DIAGNOSIS — F411 Generalized anxiety disorder: Secondary | ICD-10-CM | POA: Diagnosis not present

## 2020-04-01 DIAGNOSIS — F329 Major depressive disorder, single episode, unspecified: Secondary | ICD-10-CM | POA: Diagnosis not present

## 2020-04-01 DIAGNOSIS — Z4782 Encounter for orthopedic aftercare following scoliosis surgery: Secondary | ICD-10-CM | POA: Diagnosis not present

## 2020-04-04 DIAGNOSIS — I69811 Memory deficit following other cerebrovascular disease: Secondary | ICD-10-CM | POA: Diagnosis not present

## 2020-04-04 DIAGNOSIS — M4125 Other idiopathic scoliosis, thoracolumbar region: Secondary | ICD-10-CM | POA: Diagnosis not present

## 2020-04-04 DIAGNOSIS — F329 Major depressive disorder, single episode, unspecified: Secondary | ICD-10-CM | POA: Diagnosis not present

## 2020-04-04 DIAGNOSIS — F411 Generalized anxiety disorder: Secondary | ICD-10-CM | POA: Diagnosis not present

## 2020-04-04 DIAGNOSIS — Z4782 Encounter for orthopedic aftercare following scoliosis surgery: Secondary | ICD-10-CM | POA: Diagnosis not present

## 2020-04-04 DIAGNOSIS — D649 Anemia, unspecified: Secondary | ICD-10-CM | POA: Diagnosis not present

## 2020-04-04 DIAGNOSIS — R41841 Cognitive communication deficit: Secondary | ICD-10-CM | POA: Diagnosis not present

## 2020-04-04 DIAGNOSIS — I10 Essential (primary) hypertension: Secondary | ICD-10-CM | POA: Diagnosis not present

## 2020-04-04 DIAGNOSIS — Z9181 History of falling: Secondary | ICD-10-CM | POA: Diagnosis not present

## 2020-04-04 DIAGNOSIS — G43909 Migraine, unspecified, not intractable, without status migrainosus: Secondary | ICD-10-CM | POA: Diagnosis not present

## 2020-04-05 NOTE — Progress Notes (Signed)
Cardiology Office Note:   Date:  04/07/2020  NAME:  Rebekah Hamilton    MRN: [id number] DOB:  1946/02/12   PCP:  Unk Pinto, MD  Cardiologist:  No primary care provider on file.   Referring MD: Vicie Mutters, PA-C   Chief Complaint  Patient presents with  . Edema   History of Present Illness:   Rebekah Hamilton is a 74 y.o. female with a hx of HTN who is being seen today for the evaluation of edema at the request of Unk Pinto, MD.  She presents for evaluation of lower extremity edema.  She apparently had full spinal surgery in May of this year.  She reports she is had chronic back pains and finally had fusion surgery.  She reports has not been that active and had a slow recovery.  She is also had left hip pain which is being evaluated.  This is also resulted in poor mobility.  She is not doing any structured exercise but is doing some exercises to strengthen her back.  Apparently she had home health nurses concerned about some swelling in her legs.  She reports that swelling in her legs is worse by the end of the day.  Improved in the morning.  She is taking hydrochlorothiazide for the past few years for this.  Her swelling is occurring for nearly 4 to 5 years.  It can occur more on the right than the left.  Today she has minimal swelling with some redness noted.  This is noted to be bilaterally.  There is no pitting edema.  She has evidence of venous insufficiency on exam.  She reports that she gets no chest pain or trouble breathing.  She does have high cholesterol but takes medication for this and is well controlled.  Her most recent LDL cholesterol was 74.  Recent thyroid studies are normal.  Her EKG today demonstrates normal sinus rhythm.  She does have poor R wave progression which is likely breast artifact.  She is never had a heart attack or stroke.  Overall she appears to be in good health.  She is a never smoker.  Does not drink alcohol or consume any  drugs.  Labs from primary care physician demonstrate total cholesterol 150, HDL 60, LDL 74, triglycerides 76, A1c 5.5, hemoglobin 11.8, creatinine 0.86, TSH 1.78  Past Medical History: Past Medical History:  Diagnosis Date  . Allergy   . Anemia   . Anxiety   . Depression   . Hyperlipidemia   . Hypertension   . Lumbar stenosis   . Migraines   . Prediabetes     Past Surgical History: Past Surgical History:  Procedure Laterality Date  . BREAST LUMPECTOMY Bilateral   . CHOLECYSTECTOMY  1999  . SPINE SURGERY      Current Medications: Current Meds  Medication Sig  . [DISCONTINUED] rizatriptan (MAXALT) 10 MG tablet Take 1 tablet by mouth as directed.     Allergies:    Augmentin [amoxicillin-pot clavulanate], Doxycycline, Egg white (diagnostic), and Prozac [fluoxetine hcl]   Social History: Social History   Socioeconomic History  . Marital status: Widowed    Spouse name: Not on file  . Number of children: 2  . Years of education: Not on file  . Highest education level: Not on file  Occupational History  . Occupation: Optometrist  Tobacco Use  . Smoking status: Never Smoker  . Smokeless tobacco: Never Used  Vaping Use  . Vaping Use: Never used  Substance and Sexual Activity  . Alcohol use: Yes    Alcohol/week: 2.0 standard drinks    Types: 2 Glasses of wine per week  . Drug use: No  . Sexual activity: Not Currently  Other Topics Concern  . Not on file  Social History Narrative  . Not on file   Social Determinants of Health   Financial Resource Strain:   . Difficulty of Paying Living Expenses: Not on file  Food Insecurity:   . Worried About Charity fundraiser in the Last Year: Not on file  . Ran Out of Food in the Last Year: Not on file  Transportation Needs:   . Lack of Transportation (Medical): Not on file  . Lack of Transportation (Non-Medical): Not on file  Physical Activity:   . Days of Exercise per Week: Not on file  . Minutes of Exercise per  Session: Not on file  Stress:   . Feeling of Stress : Not on file  Social Connections:   . Frequency of Communication with Friends and Family: Not on file  . Frequency of Social Gatherings with Friends and Family: Not on file  . Attends Religious Services: Not on file  . Active Member of Clubs or Organizations: Not on file  . Attends Archivist Meetings: Not on file  . Marital Status: Not on file     Family History: The patient's family history includes Heart attack in her paternal grandmother; Heart attack (age of onset: 9) in her father; Hypertension in her mother; Other in her maternal grandmother; Stroke in her mother.  ROS:   All other ROS reviewed and negative. Pertinent positives noted in the HPI.     EKGs/Labs/Other Studies Reviewed:   The following studies were personally reviewed by me today:  EKG:  EKG is ordered today.  The ekg ordered today demonstrates normal sinus rhythm, heart rate 63, poor R progression, and was personally reviewed by me.   Recent Labs: 02/12/2020: ALT 8; BUN 19; Creat 0.86; Hemoglobin 11.8; Magnesium 2.0; Platelets 316; Potassium 5.1; Sodium 143; TSH 1.78   Recent Lipid Panel    Component Value Date/Time   CHOL 150 11/11/2019 1439   TRIG 76 11/11/2019 1439   HDL 60 11/11/2019 1439   CHOLHDL 2.5 11/11/2019 1439   VLDL 25 02/20/2017 1055   LDLCALC 74 11/11/2019 1439    Physical Exam:   VS:  BP 120/72   Pulse 63   Ht 5\' 4"  (1.626 m)   Wt 151 lb 12.8 oz (68.9 kg)   BMI 26.06 kg/m    Wt Readings from Last 3 Encounters:  04/07/20 151 lb 12.8 oz (68.9 kg)  03/19/20 153 lb (69.4 kg)  02/12/20 157 lb (71.2 kg)    General: Well nourished, well developed, in no acute distress Heart: Atraumatic, normal size  Eyes: PEERLA, EOMI  Neck: Supple, no JVD Endocrine: No thryomegaly Cardiac: Normal S1, S2; RRR; no murmurs, rubs, or gallops Lungs: Clear to auscultation bilaterally, no wheezing, rhonchi or rales  Abd: Soft, nontender, no  hepatomegaly  Ext: Trace edema, venous insufficiency changes noted Musculoskeletal: No deformities, BUE and BLE strength normal and equal Skin: Warm and dry, no rashes   Neuro: Alert and oriented to person, place, time, and situation, CNII-XII grossly intact, no focal deficits  Psych: Normal mood and affect   ASSESSMENT:   Lory Nowaczyk Shimer Ishmael Hamilton is a 74 y.o. female who presents for the following: 1. Leg edema   2. Essential hypertension  3. Chronic venous insufficiency     PLAN:   1. Leg edema 2. Essential hypertension 3. Chronic venous insufficiency -She has venous insufficiency on examination.  She has no evidence of clinical heart failure.  She has no JVD.  There is no murmurs rubs or gallops.  Her EKG demonstrates normal sinus rhythm with poor R wave progression which is likely artifactual. -Risk factors are immobility as well as poor veins.  I recommended compression stockings as well as leg elevation.  She takes hydrochlorothiazide as needed for this.  She should be cautious with this as this could lower her blood pressure.  She has no symptoms concerning for infection.  She has bilateral redness noted.  This is just he noted to blood pooling.  We did discuss possible ultrasounds but at this point she is without major symptoms.  She has no pain from her venous insufficiency.  I recommended conservative measures for now.  She will pursue leg elevation and compression stockings.  She will see [place] as needed.  Disposition: Return if symptoms worsen or fail to improve.  Medication Adjustments/Labs and Tests Ordered: Current medicines are reviewed at length with the patient today.  Concerns regarding medicines are outlined above.  Orders Placed This Encounter  Procedures  . EKG 12-Lead   No orders of the defined types were placed in this encounter.   Patient Instructions  Medication Instructions:  The current medical regimen is effective;  continue present plan and  medications.  *If you need a refill on your cardiac medications before your next appointment, please call your pharmacy*   Follow-Up: At [facility], you and your health needs are our priority.  As part of our continuing mission to provide you with exceptional heart care, we have created designated Provider Care Teams.  These Care Teams include your primary Cardiologist (physician) and Advanced Practice Providers (APPs -  Physician Assistants and Nurse Practitioners) who all work together to provide you with the care you need, when you need it.  We recommend signing up for the patient portal called "MyChart".  Sign up information is provided on this After Visit Summary.  MyChart is used to connect with patients for Virtual Visits (Telemedicine).  Patients are able to view lab/test results, encounter notes, upcoming appointments, etc.  Non-urgent messages can be sent to your provider as well.   To learn more about what you can do with MyChart, go to [email or web address].    Your next appointment:   As needed  The format for your next appointment:   In Person  Provider:   Eleonore Chiquito, MD       Signed, Addison Naegeli. Audie Box, [place]  [address] [place], [place] [address] [phone]  04/07/2020 11:05 AM

## 2020-04-06 ENCOUNTER — Telehealth: Payer: Self-pay

## 2020-04-06 DIAGNOSIS — Z4782 Encounter for orthopedic aftercare following scoliosis surgery: Secondary | ICD-10-CM | POA: Diagnosis not present

## 2020-04-06 DIAGNOSIS — I10 Essential (primary) hypertension: Secondary | ICD-10-CM | POA: Diagnosis not present

## 2020-04-06 DIAGNOSIS — F411 Generalized anxiety disorder: Secondary | ICD-10-CM | POA: Diagnosis not present

## 2020-04-06 DIAGNOSIS — F329 Major depressive disorder, single episode, unspecified: Secondary | ICD-10-CM | POA: Diagnosis not present

## 2020-04-06 DIAGNOSIS — R41841 Cognitive communication deficit: Secondary | ICD-10-CM | POA: Diagnosis not present

## 2020-04-06 DIAGNOSIS — M4125 Other idiopathic scoliosis, thoracolumbar region: Secondary | ICD-10-CM | POA: Diagnosis not present

## 2020-04-06 NOTE — Telephone Encounter (Signed)
Would prefer an office visit here or with orthopedics. It could also be a bursitis/tendon issue of that hip, worse case would be infection if any fever or chills. Suggest trying to get in here or in ortho before Xray.  Ice hip, rest.

## 2020-04-06 NOTE — Telephone Encounter (Signed)
Patient has an upcoming appointment in our office

## 2020-04-06 NOTE — Telephone Encounter (Signed)
Cecille Rubin, home health nurse from [facility] called. States that patient is having intermittent sharp hip pain on the left side. Concerned that there might be an alignment issue, pelvic dipping. Questioning if she should have another xray done.

## 2020-04-06 NOTE — Telephone Encounter (Signed)
Left message on voice mail  to call back

## 2020-04-06 NOTE — Addendum Note (Signed)
Addended by: Vicie Mutters R on: 04/06/2020 12:42 PM   Modules accepted: Level of Service

## 2020-04-07 ENCOUNTER — Ambulatory Visit (INDEPENDENT_AMBULATORY_CARE_PROVIDER_SITE_OTHER): Payer: Medicare Other | Admitting: Cardiovascular Disease

## 2020-04-07 ENCOUNTER — Other Ambulatory Visit: Payer: Self-pay

## 2020-04-07 ENCOUNTER — Encounter: Payer: Self-pay | Admitting: Cardiovascular Disease

## 2020-04-07 VITALS — BP 120/72 | HR 63 | Ht 64.0 in | Wt 151.8 lb

## 2020-04-07 DIAGNOSIS — Z4782 Encounter for orthopedic aftercare following scoliosis surgery: Secondary | ICD-10-CM | POA: Diagnosis not present

## 2020-04-07 DIAGNOSIS — F329 Major depressive disorder, single episode, unspecified: Secondary | ICD-10-CM | POA: Diagnosis not present

## 2020-04-07 DIAGNOSIS — F411 Generalized anxiety disorder: Secondary | ICD-10-CM | POA: Diagnosis not present

## 2020-04-07 DIAGNOSIS — I10 Essential (primary) hypertension: Secondary | ICD-10-CM

## 2020-04-07 DIAGNOSIS — R41841 Cognitive communication deficit: Secondary | ICD-10-CM | POA: Diagnosis not present

## 2020-04-07 DIAGNOSIS — M4125 Other idiopathic scoliosis, thoracolumbar region: Secondary | ICD-10-CM | POA: Diagnosis not present

## 2020-04-07 DIAGNOSIS — I872 Venous insufficiency (chronic) (peripheral): Secondary | ICD-10-CM | POA: Diagnosis not present

## 2020-04-07 DIAGNOSIS — R600 Localized edema: Secondary | ICD-10-CM | POA: Diagnosis not present

## 2020-04-07 NOTE — Patient Instructions (Signed)
Medication Instructions:  The current medical regimen is effective;  continue present plan and medications.  *If you need a refill on your cardiac medications before your next appointment, please call your pharmacy*    Follow-Up: At [facility], you and your health needs are our priority.  As part of our continuing mission to provide you with exceptional heart care, we have created designated Provider Care Teams.  These Care Teams include your primary Cardiologist (physician) and Advanced Practice Providers (APPs -  Physician Assistants and Nurse Practitioners) who all work together to provide you with the care you need, when you need it.  We recommend signing up for the patient portal called "MyChart".  Sign up information is provided on this After Visit Summary.  MyChart is used to connect with patients for Virtual Visits (Telemedicine).  Patients are able to view lab/test results, encounter notes, upcoming appointments, etc.  Non-urgent messages can be sent to your provider as well.   To learn more about what you can do with MyChart, go to [email or web address].    Your next appointment:   As needed  The format for your next appointment:   In Person  Provider:   [place] O'Neal, MD      

## 2020-04-08 ENCOUNTER — Ambulatory Visit
Admission: RE | Admit: 2020-04-08 | Discharge: 2020-04-08 | Disposition: A | Discharge status: Home / Self Care | Payer: Medicare Other | Source: Ambulatory Visit | Attending: Adult Health | Admitting: Adult Health

## 2020-04-08 ENCOUNTER — Other Ambulatory Visit: Payer: Self-pay

## 2020-04-08 ENCOUNTER — Ambulatory Visit (INDEPENDENT_AMBULATORY_CARE_PROVIDER_SITE_OTHER): Payer: Medicare Other | Admitting: Adult Health

## 2020-04-08 ENCOUNTER — Encounter: Payer: Self-pay | Admitting: Adult Health

## 2020-04-08 VITALS — BP 118/64 | HR 61 | Temp 97.7°F | Wt 152.0 lb

## 2020-04-08 DIAGNOSIS — M25552 Pain in left hip: Secondary | ICD-10-CM | POA: Diagnosis not present

## 2020-04-08 DIAGNOSIS — M160 Bilateral primary osteoarthritis of hip: Secondary | ICD-10-CM | POA: Diagnosis not present

## 2020-04-08 DIAGNOSIS — Z9889 Other specified postprocedural states: Secondary | ICD-10-CM | POA: Diagnosis not present

## 2020-04-08 DIAGNOSIS — Z981 Arthrodesis status: Secondary | ICD-10-CM | POA: Diagnosis not present

## 2020-04-08 NOTE — Patient Instructions (Signed)
Hip Pain The hip is the joint between the upper legs and the lower pelvis. The bones, cartilage, tendons, and muscles of your hip joint support your body and allow you to move around. Hip pain can range from a minor ache to severe pain in one or both of your hips. The pain may be felt on the inside of the hip joint near the groin, or on the outside near the buttocks and upper thigh. You may also have swelling or stiffness in your hip area. Follow these instructions at home: Managing pain, stiffness, and swelling      If directed, put ice on the painful area. To do this: ? Put ice in a plastic bag. ? Place a towel between your skin and the bag. ? Leave the ice on for 20 minutes, 2-3 times a day.  If directed, apply heat to the affected area as often as told by your health care provider. Use the heat source that your health care provider recommends, such as a moist heat pack or a heating pad. ? Place a towel between your skin and the heat source. ? Leave the heat on for 20-30 minutes. ? Remove the heat if your skin turns bright red. This is especially important if you are unable to feel pain, heat, or cold. You may have a greater risk of getting burned. Activity  Do exercises as told by your health care provider.  Avoid activities that cause pain. General instructions   Take over-the-counter and prescription medicines only as told by your health care provider.  Keep a journal of your symptoms. Write down: ? How often you have hip pain. ? The location of your pain. ? What the pain feels like. ? What makes the pain worse.  Sleep with a pillow between your legs on your most comfortable side.  Keep all follow-up visits as told by your health care provider. This is important. Contact a health care provider if:  You cannot put weight on your leg.  Your pain or swelling continues or gets worse after one week.  It gets harder to walk.  You have a fever. Get help right away  if:  You fall.  You have a sudden increase in pain and swelling in your hip.  Your hip is red or swollen or very tender to touch. Summary  Hip pain can range from a minor ache to severe pain in one or both of your hips.  The pain may be felt on the inside of the hip joint near the groin, or on the outside near the buttocks and upper thigh.  Avoid activities that cause pain.  Write down how often you have hip pain, the location of the pain, what makes it worse, and what it feels like. This information is not intended to replace advice given to you by your health care provider. Make sure you discuss any questions you have with your health care provider. Document Revised: 12/17/2018 Document Reviewed: 12/17/2018 Elsevier Patient Education  2020 [employer] -- 

## 2020-04-08 NOTE — Progress Notes (Signed)
Assessment and Plan:  Rebekah Hamilton was seen today for hip pain.  Diagnoses and all orders for this visit:   Left hip pain With leg length discrepancy, though anticipated this may be chronic r/t longstanding scoliosis with recent fusion; check hip xray per PT recommendation Possible mild SI joint inflammation, mild tendonitis or tensor fasciae latae strain per exam Exam not suggestive of bursitis  Try icing, continue voltaren, celebrex if xray unremarkable discussed trying short course of steroid  If persistent or worsening will encourage ortho follow up for evaluation  -     DG HIPS BILAT WITH PELVIS 2V  Further disposition pending results of labs. Discussed med's effects and SE's.   Over 15 minutes of exam, counseling, chart review, and critical decision making was performed.   Future Appointments  Date Time Provider [place]  06/04/2020  2:00 PM Unk Pinto, MD [facility] None  11/11/2020  2:00 PM Liane Comber, NP [facility] None    ------------------------------------------------------------------------------------------------------------------   HPI BP 118/64    Pulse 61    Temp 97.7 F (36.5 C)    Wt 152 lb (68.9 kg)    SpO2 97%    BMI 26.09 kg/m   74 y.o.female presents for evaluation due to hip pain. She is notably Rebekah Hamilton is s/p T9-S2 performed on 12-23-19 by Dr, Jenne Campus by [facility].  Patient here for new 3 weeks of intermittent posterior and lateral intermittent L hip pain, requesting hip xray per PT.    She has been doing twice weekly home PT following lumbar surgery, weekly home health nurse She had L hip and extremity weakness/tingling following surgery that has improved PT noted R leg approx 1 inch shorter, weaker in L leg, noted pelvic tilting, has recommended shoe insert Was doing strengthening exercises for L leg Was having L hip pain posterior to trochanter Patient came in and got steroid injection in posterior hip by Estill Bamberg on 03/19/2020 This  helped resolved pain She reports 4-5 days later started having pain in different location; intermittent posterior/sacral and also lateral hip pain, NOT in area of injection  She describes pain is intermittent; none currently at rest Worse with pressure/massage - "tender" "stabbing" - localized and non-radiating  Or if she walks a lot or with aggressive PT. PT apparently   She denies fever/chills. Pain ranges 0-10/10. Stopping movement and sitting or standing still will quickly resolve pain. Denies nocturnal sx, sleeps on her back.   Has been taking celebrex 200 mg BID, cymbalta 60 mg, gabapentin 300 mg TID. Did try topical voltaren gel this AM and felt this was beneficial      X-RAY THORACOLUMBAR SPINE, AP AND LAT, SUPINE AND ERECT(4 VIEWS), 03/18/2020 1:48 PM  INDICATION: s/p lumbar fusion \ Z98.1 S/P lumbar spinal fusion  COMPARISON: Thoracolumbar spine radiographs dated 10/07/2019, outside lumbar spine MRI dated 09/01/2019, outside lumbar spine radiographs dated 02/27/2019  CONCLUSION:  1. No acute fracture.  2. Postsurgical changes of interval PLIF spanning T9 to the sacrum with bilateral sacroiliac screws. There are interbody grafts from T12 to L3 and L4 to S1 with evidence of some osseous incorporation at multiple levels, most significant at L5-S1.  3. The tip of the left T9 pedicle screw projects superior to the superior aspect of the T9 endplate. No hardware fracture. No periscrew lucencies. 4. Interval significant improvement of dextroscoliosis of the lumbar spine. Persistent rotational component, most significant at L2-L3. 5. Mild multilevel anterolisthesis from T9 to T12 and mild retrolisthesis of L2 on L3 without  change between supine and upright positions.  6. Osteopenia. 7. Cholecystectomy clips.   Past Medical History:  Diagnosis Date   Allergy    Anemia    Anxiety    Depression    Hyperlipidemia    Hypertension    Lumbar stenosis    Migraines    Prediabetes       Allergies  Allergen Reactions   Augmentin [Amoxicillin-Pot Clavulanate] Diarrhea   Doxycycline Nausea Only   Egg White (Diagnostic) Nausea Only   Prozac [Fluoxetine Hcl] Other (See Comments)    Disoriented    Current Outpatient Medications on File Prior to Visit  Medication Sig   ALPRAZolam (XANAX) 0.25 MG tablet Take 1/2 to 1 tablet 2 to 3 x / day ONLY if needed for Acute Anxiety Attacks  & please try to limit to 5 days / week to avoid addiction   Ascorbic Acid (VITAMIN C PO) 500 mg 2 (two) times daily.   celecoxib (CELEBREX) 200 MG capsule Take 1 capsule (200 mg total) by mouth 2 (two) times daily.   Cholecalciferol (VITAMIN D PO) Take by mouth. Takes 10000 to 12000 units daily.   diclofenac sodium (VOLTAREN) 1 % GEL Apply 2 g topically 4 (four) times daily.   DULoxetine (CYMBALTA) 60 MG capsule TAKE 1 CAPSULE BY MOUTH EVERY DAY   Elastic Bandages & Supports (MEDICAL COMPRESSION STOCKINGS) MISC Knee high compression socks 20-30 pressure, measure and fit   estradiol (ESTRACE) 0.5 MG tablet TAKE 1 TABLET BY MOUTH EVERY DAY   fexofenadine (ALLEGRA) 180 MG tablet Take 180 mg by mouth daily.   gabapentin (NEURONTIN) 300 MG capsule Take 1 capsule (300 mg total) by mouth 3 (three) times daily.   hydrochlorothiazide (HYDRODIURIL) 25 MG tablet Take 1/2 -1 tablet daily for BP and fluid for goal <130/80.   HYDROcodone-acetaminophen (NORCO/VICODIN) 5-325 MG tablet Take 1 tablet by mouth every 6 (six) hours as needed for moderate pain.   metoprolol tartrate (LOPRESSOR) 25 MG tablet Take 0.5 tablets (12.5 mg total) by mouth 2 (two) times daily.   pravastatin (PRAVACHOL) 40 MG tablet Take 1 tablet at Bedtime for Cholesterol   progesterone (PROMETRIUM) 100 MG capsule TAKE ONE CAPSULE BY MOUTH DAILY   rizatriptan (MAXALT) 10 MG tablet Take 1 tablet for Migraine & may repeat 1 x in 2 hrs - ((Maximum 2 tablets  /24 hours))   tizanidine (ZANAFLEX) 2 MG capsule Take 1 capsule (2  mg total) by mouth 3 (three) times daily as needed for muscle spasms.   No current facility-administered medications on file prior to visit.    ROS: all negative except above.   Physical Exam:  BP 118/64    Pulse 61    Temp 97.7 F (36.5 C)    Wt 152 lb (68.9 kg)    SpO2 97%    BMI 26.09 kg/m   General Appearance: Well nourished, in no apparent distress. Eyes: PERRLA, EOMs, conjunctiva no swelling or erythema Sinuses: No Frontal/maxillary tenderness ENT/Mouth: Ext aud canals clear, TMs without erythema, bulging. No erythema, swelling, or exudate on post pharynx.  Tonsils not swollen or erythematous. Hearing normal.  Neck: Supple, thyroid normal.  Respiratory: Respiratory effort normal, BS equal bilaterally without rales, rhonchi, wheezing or stridor.  Cardio: RRR with no MRGs. Brisk peripheral pulses without edema.  Abdomen: Soft, + BS.  Non tender, no guarding, rebound, hernias, masses. Lymphatics: Non tender without lymphadenopathy.  Musculoskeletal: Intact symmetrical ROM bil hips without popping, catching, crepitus, 5/5 strength throughout bil lower extremities,  She has some tenderness just lateral to L SI and SUPERIOR to L greater trochanter over tensor fascia latae; non-tender over bursa. slow steady gait with exaggerated hip sway, non-antalgic Skin: Warm, dry without rashes, lesions, ecchymosis.  Neuro: Normal muscle tone, Sensation intact.  Psych: Awake and oriented X 3, normal affect, Insight and Judgment appropriate.     Izora Ribas, NP 2:31 PM [facility]

## 2020-04-09 ENCOUNTER — Other Ambulatory Visit: Payer: Self-pay | Admitting: Adult Health

## 2020-04-09 DIAGNOSIS — F329 Major depressive disorder, single episode, unspecified: Secondary | ICD-10-CM | POA: Diagnosis not present

## 2020-04-09 DIAGNOSIS — M4125 Other idiopathic scoliosis, thoracolumbar region: Secondary | ICD-10-CM | POA: Diagnosis not present

## 2020-04-09 DIAGNOSIS — Z4782 Encounter for orthopedic aftercare following scoliosis surgery: Secondary | ICD-10-CM | POA: Diagnosis not present

## 2020-04-09 DIAGNOSIS — I10 Essential (primary) hypertension: Secondary | ICD-10-CM | POA: Diagnosis not present

## 2020-04-09 DIAGNOSIS — F411 Generalized anxiety disorder: Secondary | ICD-10-CM | POA: Diagnosis not present

## 2020-04-09 DIAGNOSIS — R41841 Cognitive communication deficit: Secondary | ICD-10-CM | POA: Diagnosis not present

## 2020-04-09 MED ORDER — PREDNISONE 20 MG PO TABS: ORAL_TABLET | ORAL | 0 refills | Status: DC

## 2020-04-13 DIAGNOSIS — F411 Generalized anxiety disorder: Secondary | ICD-10-CM | POA: Diagnosis not present

## 2020-04-13 DIAGNOSIS — R41841 Cognitive communication deficit: Secondary | ICD-10-CM | POA: Diagnosis not present

## 2020-04-13 DIAGNOSIS — Z4782 Encounter for orthopedic aftercare following scoliosis surgery: Secondary | ICD-10-CM | POA: Diagnosis not present

## 2020-04-13 DIAGNOSIS — I10 Essential (primary) hypertension: Secondary | ICD-10-CM | POA: Diagnosis not present

## 2020-04-13 DIAGNOSIS — M4125 Other idiopathic scoliosis, thoracolumbar region: Secondary | ICD-10-CM | POA: Diagnosis not present

## 2020-04-13 DIAGNOSIS — F329 Major depressive disorder, single episode, unspecified: Secondary | ICD-10-CM | POA: Diagnosis not present

## 2020-04-14 DIAGNOSIS — F411 Generalized anxiety disorder: Secondary | ICD-10-CM | POA: Diagnosis not present

## 2020-04-14 DIAGNOSIS — M4125 Other idiopathic scoliosis, thoracolumbar region: Secondary | ICD-10-CM | POA: Diagnosis not present

## 2020-04-14 DIAGNOSIS — I10 Essential (primary) hypertension: Secondary | ICD-10-CM | POA: Diagnosis not present

## 2020-04-14 DIAGNOSIS — F329 Major depressive disorder, single episode, unspecified: Secondary | ICD-10-CM | POA: Diagnosis not present

## 2020-04-14 DIAGNOSIS — Z4782 Encounter for orthopedic aftercare following scoliosis surgery: Secondary | ICD-10-CM | POA: Diagnosis not present

## 2020-04-14 DIAGNOSIS — R41841 Cognitive communication deficit: Secondary | ICD-10-CM | POA: Diagnosis not present

## 2020-04-15 DIAGNOSIS — R41841 Cognitive communication deficit: Secondary | ICD-10-CM | POA: Diagnosis not present

## 2020-04-15 DIAGNOSIS — F411 Generalized anxiety disorder: Secondary | ICD-10-CM | POA: Diagnosis not present

## 2020-04-15 DIAGNOSIS — Z4782 Encounter for orthopedic aftercare following scoliosis surgery: Secondary | ICD-10-CM | POA: Diagnosis not present

## 2020-04-15 DIAGNOSIS — I10 Essential (primary) hypertension: Secondary | ICD-10-CM | POA: Diagnosis not present

## 2020-04-15 DIAGNOSIS — F329 Major depressive disorder, single episode, unspecified: Secondary | ICD-10-CM | POA: Diagnosis not present

## 2020-04-15 DIAGNOSIS — M4125 Other idiopathic scoliosis, thoracolumbar region: Secondary | ICD-10-CM | POA: Diagnosis not present

## 2020-04-16 DIAGNOSIS — I10 Essential (primary) hypertension: Secondary | ICD-10-CM | POA: Diagnosis not present

## 2020-04-16 DIAGNOSIS — Z4782 Encounter for orthopedic aftercare following scoliosis surgery: Secondary | ICD-10-CM | POA: Diagnosis not present

## 2020-04-16 DIAGNOSIS — F329 Major depressive disorder, single episode, unspecified: Secondary | ICD-10-CM | POA: Diagnosis not present

## 2020-04-16 DIAGNOSIS — M4125 Other idiopathic scoliosis, thoracolumbar region: Secondary | ICD-10-CM | POA: Diagnosis not present

## 2020-04-16 DIAGNOSIS — R41841 Cognitive communication deficit: Secondary | ICD-10-CM | POA: Diagnosis not present

## 2020-04-16 DIAGNOSIS — F411 Generalized anxiety disorder: Secondary | ICD-10-CM | POA: Diagnosis not present

## 2020-04-21 DIAGNOSIS — Z4782 Encounter for orthopedic aftercare following scoliosis surgery: Secondary | ICD-10-CM | POA: Diagnosis not present

## 2020-04-21 DIAGNOSIS — F411 Generalized anxiety disorder: Secondary | ICD-10-CM | POA: Diagnosis not present

## 2020-04-21 DIAGNOSIS — F329 Major depressive disorder, single episode, unspecified: Secondary | ICD-10-CM | POA: Diagnosis not present

## 2020-04-21 DIAGNOSIS — I10 Essential (primary) hypertension: Secondary | ICD-10-CM | POA: Diagnosis not present

## 2020-04-21 DIAGNOSIS — M4125 Other idiopathic scoliosis, thoracolumbar region: Secondary | ICD-10-CM | POA: Diagnosis not present

## 2020-04-21 DIAGNOSIS — R41841 Cognitive communication deficit: Secondary | ICD-10-CM | POA: Diagnosis not present

## 2020-04-22 DIAGNOSIS — F411 Generalized anxiety disorder: Secondary | ICD-10-CM | POA: Diagnosis not present

## 2020-04-22 DIAGNOSIS — R41841 Cognitive communication deficit: Secondary | ICD-10-CM | POA: Diagnosis not present

## 2020-04-22 DIAGNOSIS — Z4782 Encounter for orthopedic aftercare following scoliosis surgery: Secondary | ICD-10-CM | POA: Diagnosis not present

## 2020-04-22 DIAGNOSIS — M4125 Other idiopathic scoliosis, thoracolumbar region: Secondary | ICD-10-CM | POA: Diagnosis not present

## 2020-04-22 DIAGNOSIS — F329 Major depressive disorder, single episode, unspecified: Secondary | ICD-10-CM | POA: Diagnosis not present

## 2020-04-22 DIAGNOSIS — I10 Essential (primary) hypertension: Secondary | ICD-10-CM | POA: Diagnosis not present

## 2020-04-23 DIAGNOSIS — F329 Major depressive disorder, single episode, unspecified: Secondary | ICD-10-CM | POA: Diagnosis not present

## 2020-04-23 DIAGNOSIS — R41841 Cognitive communication deficit: Secondary | ICD-10-CM | POA: Diagnosis not present

## 2020-04-23 DIAGNOSIS — M4125 Other idiopathic scoliosis, thoracolumbar region: Secondary | ICD-10-CM | POA: Diagnosis not present

## 2020-04-23 DIAGNOSIS — I10 Essential (primary) hypertension: Secondary | ICD-10-CM | POA: Diagnosis not present

## 2020-04-23 DIAGNOSIS — Z4782 Encounter for orthopedic aftercare following scoliosis surgery: Secondary | ICD-10-CM | POA: Diagnosis not present

## 2020-04-23 DIAGNOSIS — F411 Generalized anxiety disorder: Secondary | ICD-10-CM | POA: Diagnosis not present

## 2020-04-27 DIAGNOSIS — Z4782 Encounter for orthopedic aftercare following scoliosis surgery: Secondary | ICD-10-CM | POA: Diagnosis not present

## 2020-04-27 DIAGNOSIS — F411 Generalized anxiety disorder: Secondary | ICD-10-CM | POA: Diagnosis not present

## 2020-04-27 DIAGNOSIS — R41841 Cognitive communication deficit: Secondary | ICD-10-CM | POA: Diagnosis not present

## 2020-04-27 DIAGNOSIS — I10 Essential (primary) hypertension: Secondary | ICD-10-CM | POA: Diagnosis not present

## 2020-04-27 DIAGNOSIS — F329 Major depressive disorder, single episode, unspecified: Secondary | ICD-10-CM | POA: Diagnosis not present

## 2020-04-27 DIAGNOSIS — M4125 Other idiopathic scoliosis, thoracolumbar region: Secondary | ICD-10-CM | POA: Diagnosis not present

## 2020-04-29 DIAGNOSIS — I10 Essential (primary) hypertension: Secondary | ICD-10-CM | POA: Diagnosis not present

## 2020-04-29 DIAGNOSIS — F411 Generalized anxiety disorder: Secondary | ICD-10-CM | POA: Diagnosis not present

## 2020-04-29 DIAGNOSIS — F329 Major depressive disorder, single episode, unspecified: Secondary | ICD-10-CM | POA: Diagnosis not present

## 2020-04-29 DIAGNOSIS — Z4782 Encounter for orthopedic aftercare following scoliosis surgery: Secondary | ICD-10-CM | POA: Diagnosis not present

## 2020-04-29 DIAGNOSIS — M4125 Other idiopathic scoliosis, thoracolumbar region: Secondary | ICD-10-CM | POA: Diagnosis not present

## 2020-04-29 DIAGNOSIS — R41841 Cognitive communication deficit: Secondary | ICD-10-CM | POA: Diagnosis not present

## 2020-05-04 DIAGNOSIS — M4125 Other idiopathic scoliosis, thoracolumbar region: Secondary | ICD-10-CM | POA: Diagnosis not present

## 2020-05-04 DIAGNOSIS — G43909 Migraine, unspecified, not intractable, without status migrainosus: Secondary | ICD-10-CM | POA: Diagnosis not present

## 2020-05-04 DIAGNOSIS — I10 Essential (primary) hypertension: Secondary | ICD-10-CM | POA: Diagnosis not present

## 2020-05-04 DIAGNOSIS — F329 Major depressive disorder, single episode, unspecified: Secondary | ICD-10-CM | POA: Diagnosis not present

## 2020-05-04 DIAGNOSIS — Z4782 Encounter for orthopedic aftercare following scoliosis surgery: Secondary | ICD-10-CM | POA: Diagnosis not present

## 2020-05-04 DIAGNOSIS — I69811 Memory deficit following other cerebrovascular disease: Secondary | ICD-10-CM | POA: Diagnosis not present

## 2020-05-04 DIAGNOSIS — F411 Generalized anxiety disorder: Secondary | ICD-10-CM | POA: Diagnosis not present

## 2020-05-04 DIAGNOSIS — D649 Anemia, unspecified: Secondary | ICD-10-CM | POA: Diagnosis not present

## 2020-05-04 DIAGNOSIS — Z9181 History of falling: Secondary | ICD-10-CM | POA: Diagnosis not present

## 2020-05-04 DIAGNOSIS — R41841 Cognitive communication deficit: Secondary | ICD-10-CM | POA: Diagnosis not present

## 2020-05-23 DIAGNOSIS — Z23 Encounter for immunization: Secondary | ICD-10-CM | POA: Diagnosis not present

## 2020-06-03 ENCOUNTER — Encounter: Payer: Self-pay | Admitting: Internal Medicine

## 2020-06-03 NOTE — Patient Instructions (Signed)
Due to recent changes in healthcare laws, you may see the results of your imaging and laboratory studies on MyChart before your provider has had a chance to review them.  We understand that in some cases there may be results that are confusing or concerning to you. Not all laboratory results come back in the same time frame and the provider may be waiting for multiple results in order to interpret others.  Please give [place] 48 hours in order for your provider to thoroughly review all the results before contacting the office for clarification of your results.   +++++++++++++++++++++++++  Vit D  & Vit C 1,000 mg   are recommended to help protect  against the Covid-19 and other Corona viruses.    Also it's recommended  to take  Zinc 50 mg  to help  protect against the Covid-19   and best place to get  is also on [email or web address]  and don't pay more than 6-8 cents /pill !  ================================ Coronavirus (COVID-19) Are you at risk?  Are you at risk for the Coronavirus (COVID-19)?  To be considered HIGH RISK for Coronavirus (COVID-19), you have to meet the following criteria:  . Traveled to [place], [place], [place], [place] or [place]; or in the [place] to [place], [place], [place]  . or [place]; and have fever, cough, and shortness of breath within the last 2 weeks of travel OR . Been in close contact with a person diagnosed with COVID-19 within the last 2 weeks and have  . fever, cough,and shortness of breath .  . IF YOU DO NOT MEET THESE CRITERIA, YOU ARE CONSIDERED LOW RISK FOR COVID-19.  What to do if you are HIGH RISK for COVID-19?  Marland Kitchen If you are having a medical emergency, call 911. . Seek medical care right away. Before you go to a doctor's office, urgent care or emergency department, .  call ahead and tell them about your recent travel, contact with someone diagnosed with COVID-19  .  and your symptoms.  . You should receive instructions from your physician's  office regarding next steps of care.  . When you arrive at healthcare provider, tell the healthcare staff immediately you have returned from  . visiting [place], [place], [place], [place] or [place]; or traveled in the [place] to [place], [place],  . [place] or [place] in the last two weeks or you have been in close contact with a person diagnosed with  . COVID-19 in the last 2 weeks.   . Tell the health care staff about your symptoms: fever, cough and shortness of breath. . After you have been seen by a medical provider, you will be either: o Tested for (COVID-19) and discharged home on quarantine except to seek medical care if  o symptoms worsen, and asked to  - Stay home and avoid contact with others until you get your results (4-5 days)  - Avoid travel on public transportation if possible (such as bus, train, or airplane) or o Sent to the Emergency Department by EMS for evaluation, COVID-19 testing  and  o possible admission depending on your condition and test results.  What to do if you are LOW RISK for COVID-19?  Reduce your risk of any infection by using the same precautions used for avoiding the common cold or flu:  Marland Kitchen Wash your hands often with soap and warm water for at least 20 seconds.  If soap and water are not readily  available,  . use an alcohol-based hand sanitizer with at least 60% alcohol.  . If coughing or sneezing, cover your mouth and nose by coughing or sneezing into the elbow areas of your shirt or coat, .  into a tissue or into your sleeve (not your hands). . Avoid shaking hands with others and consider head nods or verbal greetings only. . Avoid touching your eyes, nose, or mouth with unwashed hands.  . Avoid close contact with people who are sick. . Avoid places or events with large numbers of people in one location, like concerts or sporting events. . Carefully consider travel plans you have or are making. . If you are planning any travel outside or  inside the [place], visit the CDC's Travelers' Health webpage for the latest health notices. . If you have some symptoms but not all symptoms, continue to monitor at home and seek medical attention  . if your symptoms worsen. . If you are having a medical emergency, call 911.   . >>>>>>>>>>>>>>>>>>>>>>>>>>>>>>>>> . We Do NOT Approve of  [facility], [employer]  Our Patients  To Do Home Visits & We Do NOT Approve of [employer] > > > > > > > > > > > > > > > > > > > > > > > > > > > > > > > > > > > > > > >  Preventive Care for Adults  A healthy lifestyle and preventive care can promote health and wellness. Preventive health guidelines for women include the following key practices.  A routine yearly physical is a good way to check with your health care provider about your health and preventive screening. It is a chance to share any concerns and updates on your health and to receive a thorough exam.  Visit your dentist for a routine exam and preventive care every 6 months. Brush your teeth twice a day and floss once a day. Good oral hygiene prevents tooth decay and gum disease.  The frequency of eye exams is based on your age, health, family medical history, use of contact lenses, and other factors. Follow your health care provider's recommendations for frequency of eye exams.  Eat a healthy diet. Foods like vegetables, fruits, whole grains, low-fat dairy products, and lean protein foods contain the nutrients you need without too many calories. Decrease your intake of foods high in solid fats, added sugars, and salt. Eat the right amount of calories for you. Get information about a proper diet from your health care provider, if necessary.  Regular physical exercise is one of the most important things you can do for your health. Most adults should get at least 150 minutes of moderate-intensity exercise (any activity that increases your heart rate and causes you to sweat) each  week. In addition, most adults need muscle-strengthening exercises on 2 or more days a week.  Maintain a healthy weight. The body mass index (BMI) is a screening tool to identify possible weight problems. It provides an estimate of body fat based on height and weight. Your health care provider can find your BMI and can help you achieve or maintain a healthy weight. For adults 20 years and older:  A BMI below 18.5 is considered underweight.  A BMI of 18.5 to 24.9 is normal.  A BMI of 25 to 29.9 is considered overweight.  A BMI of 30 and above is considered obese.  Maintain normal blood lipids and cholesterol levels by exercising and minimizing your intake of  saturated fat. Eat a balanced diet with plenty of fruit and vegetables. If your lipid or cholesterol levels are high, you are over 50, or you are at high risk for heart disease, you may need your cholesterol levels checked more frequently. Ongoing high lipid and cholesterol levels should be treated with medicines if diet and exercise are not working.  If you smoke, find out from your health care provider how to quit. If you do not use tobacco, do not start.  Lung cancer screening is recommended for adults aged 78-80 years who are at high risk for developing lung cancer because of a history of smoking. A yearly low-dose CT scan of the lungs is recommended for people who have at least a 30-pack-year history of smoking and are a current smoker or have quit within the past 15 years. A pack year of smoking is smoking an average of 1 pack of cigarettes a day for 1 year (for example: 1 pack a day for 30 years or 2 packs a day for 15 years). Yearly screening should continue until the smoker has stopped smoking for at least 15 years. Yearly screening should be stopped for people who develop a health problem that would prevent them from having lung cancer treatment.  Avoid use of street drugs. Do not share needles with anyone. Ask for help if you need  support or instructions about stopping the use of drugs.  High blood pressure causes heart disease and increases the risk of stroke.  Ongoing high blood pressure should be treated with medicines if weight loss and exercise do not work.  If you are 14-45 years old, ask your health care provider if you should take aspirin to prevent strokes.  Diabetes screening involves taking a blood sample to check your fasting blood sugar level. This should be done once every 3 years, after age 40, if you are within normal weight and without risk factors for diabetes. Testing should be considered at a younger age or be carried out more frequently if you are overweight and have at least 1 risk factor for diabetes.  Breast cancer screening is essential preventive care for women. You should practice "breast self-awareness." This means understanding the normal appearance and feel of your breasts and may include breast self-examination. Any changes detected, no matter how small, should be reported to a health care provider. Women in their 18s and 30s should have a clinical breast exam (CBE) by a health care provider as part of a regular health exam every 1 to 3 years. After age 71, women should have a CBE every year. Starting at age 39, women should consider having a mammogram (breast X-ray test) every year. Women who have a family history of breast cancer should talk to their health care provider about genetic screening. Women at a high risk of breast cancer should talk to their health care providers about having an MRI and a mammogram every year.  Breast cancer gene (BRCA)-related cancer risk assessment is recommended for women who have family members with BRCA-related cancers. BRCA-related cancers include breast, ovarian, tubal, and peritoneal cancers. Having family members with these cancers may be associated with an increased risk for harmful changes (mutations) in the breast cancer genes BRCA1 and BRCA2. Results of the  assessment will determine the need for genetic counseling and BRCA1 and BRCA2 testing.  Routine pelvic exams to screen for cancer are no longer recommended for nonpregnant women who are considered low risk for cancer of the pelvic organs (ovaries,  uterus, and vagina) and who do not have symptoms. Ask your health care provider if a screening pelvic exam is right for you.  If you have had past treatment for cervical cancer or a condition that could lead to cancer, you need Pap tests and screening for cancer for at least 20 years after your treatment. If Pap tests have been discontinued, your risk factors (such as having a new sexual partner) need to be reassessed to determine if screening should be resumed. Some women have medical problems that increase the chance of getting cervical cancer. In these cases, your health care provider may recommend more frequent screening and Pap tests.    Colorectal cancer can be detected and often prevented. Most routine colorectal cancer screening begins at the age of 62 years and continues through age 28 years. However, your health care provider may recommend screening at an earlier age if you have risk factors for colon cancer. On a yearly basis, your health care provider may provide home test kits to check for hidden blood in the stool. Use of a small camera at the end of a tube, to directly examine the colon (sigmoidoscopy or colonoscopy), can detect the earliest forms of colorectal cancer. Talk to your health care provider about this at age 23, when routine screening begins.  Direct exam of the colon should be repeated every 5-10 years through age 31 years, unless early forms of pre-cancerous polyps or small growths are found.  Osteoporosis is a disease in which the bones lose minerals and strength with aging. This can result in serious bone fractures or breaks. The risk of osteoporosis can be identified using a bone density scan. Women ages 83 years and over and women  at risk for fractures or osteoporosis should discuss screening with their health care providers. Ask your health care provider whether you should take a calcium supplement or vitamin D to reduce the rate of osteoporosis.  Menopause can be associated with physical symptoms and risks. Hormone replacement therapy is available to decrease symptoms and risks. You should talk to your health care provider about whether hormone replacement therapy is right for you.  Use sunscreen. Apply sunscreen liberally and repeatedly throughout the day. You should seek shade when your shadow is shorter than you. Protect yourself by wearing long sleeves, pants, a wide-brimmed hat, and sunglasses year round, whenever you are outdoors.  Once a month, do a whole body skin exam, using a mirror to look at the skin on your back. Tell your health care provider of new moles, moles that have irregular borders, moles that are larger than a pencil eraser, or moles that have changed in shape or color.  Stay current with required vaccines (immunizations).  Influenza vaccine. All adults should be immunized every year.  Tetanus, diphtheria, and acellular pertussis (Td, Tdap) vaccine. Pregnant women should receive 1 dose of Tdap vaccine during each pregnancy. The dose should be obtained regardless of the length of time since the last dose. Immunization is preferred during the 27th-36th week of gestation. An adult who has not previously received Tdap or who does not know her vaccine status should receive 1 dose of Tdap. This initial dose should be followed by tetanus and diphtheria toxoids (Td) booster doses every 10 years. Adults with an unknown or incomplete history of completing a 3-dose immunization series with Td-containing vaccines should begin or complete a primary immunization series including a Tdap dose. Adults should receive a Td booster every 10 years.  Zoster vaccine. One dose is recommended for adults aged 64 years or  older unless certain conditions are present.    Pneumococcal 13-valent conjugate (PCV13) vaccine. When indicated, a person who is uncertain of her immunization history and has no record of immunization should receive the PCV13 vaccine. An adult aged 61 years or older who has certain medical conditions and has not been previously immunized should receive 1 dose of PCV13 vaccine. This PCV13 should be followed with a dose of pneumococcal polysaccharide (PPSV23) vaccine. The PPSV23 vaccine dose should be obtained at least 1 or more year(s) after the dose of PCV13 vaccine. An adult aged 46 years or older who has certain medical conditions and previously received 1 or more doses of PPSV23 vaccine should receive 1 dose of PCV13. The PCV13 vaccine dose should be obtained 1 or more years after the last PPSV23 vaccine dose.    Pneumococcal polysaccharide (PPSV23) vaccine. When PCV13 is also indicated, PCV13 should be obtained first. All adults aged 58 years and older should be immunized. An adult younger than age 1 years who has certain medical conditions should be immunized. Any person who resides in a nursing home or long-term care facility should be immunized. An adult smoker should be immunized. People with an immunocompromised condition and certain other conditions should receive both PCV13 and PPSV23 vaccines. People with human immunodeficiency virus (HIV) infection should be immunized as soon as possible after diagnosis. Immunization during chemotherapy or radiation therapy should be avoided. Routine use of PPSV23 vaccine is not recommended for American Indians, [place] Natives, or people younger than 65 years unless there are medical conditions that require PPSV23 vaccine. When indicated, people who have unknown immunization and have no record of immunization should receive PPSV23 vaccine. One-time revaccination 5 years after the first dose of PPSV23 is recommended for people aged 19-64 years who have chronic  kidney failure, nephrotic syndrome, asplenia, or immunocompromised conditions. People who received 1-2 doses of PPSV23 before age 11 years should receive another dose of PPSV23 vaccine at age 70 years or later if at least 5 years have passed since the previous dose. Doses of PPSV23 are not needed for people immunized with PPSV23 at or after age 65 years.   Preventive Services / Frequency  Ages 56 years and over  Blood pressure check.  Lipid and cholesterol check.  Lung cancer screening. / Every year if you are aged 16-80 years and have a 30-pack-year history of smoking and currently smoke or have quit within the past 15 years. Yearly screening is stopped once you have quit smoking for at least 15 years or develop a health problem that would prevent you from having lung cancer treatment.  Clinical breast exam.** / Every year after age 59 years.   BRCA-related cancer risk assessment.** / For women who have family members with a BRCA-related cancer (breast, ovarian, tubal, or peritoneal cancers).  Mammogram.** / Every year beginning at age 70 years and continuing for as long as you are in good health. Consult with your health care provider.  Pap test.** / Every 3 years starting at age 32 years through age 28 or 9 years with 3 consecutive normal Pap tests. Testing can be stopped between 65 and 70 years with 3 consecutive normal Pap tests and no abnormal Pap or HPV tests in the past 10 years.  Fecal occult blood test (FOBT) of stool. / Every year beginning at age 57 years and continuing until age 29 years. You may not need to  do this test if you get a colonoscopy every 10 years.  Flexible sigmoidoscopy or colonoscopy.** / Every 5 years for a flexible sigmoidoscopy or every 10 years for a colonoscopy beginning at age 32 years and continuing until age 59 years.  Hepatitis C blood test.** / For all people born from [birth year] through 1964 and any individual with known risks for hepatitis  C.  Osteoporosis screening.** / A one-time screening for women ages 78 years and over and women at risk for fractures or osteoporosis.  Skin self-exam. / Monthly.  Influenza vaccine. / Every year.  Tetanus, diphtheria, and acellular pertussis (Tdap/Td) vaccine.** / 1 dose of Td every 10 years.  Zoster vaccine.** / 1 dose for adults aged 79 years or older.  Pneumococcal 13-valent conjugate (PCV13) vaccine.** / Consult your health care provider.  Pneumococcal polysaccharide (PPSV23) vaccine.** / 1 dose for all adults aged 69 years and older. Screening for abdominal aortic aneurysm (AAA)  by ultrasound is recommended for people who have history of high blood pressure or who are current or former smokers. ++++++++++++++++++++ Recommend Adult Low Dose Aspirin or  coated  Aspirin 81 mg daily  To reduce risk of Colon Cancer 40 %,  Skin Cancer 26 % ,  Melanoma 46%  and  Pancreatic cancer 60% ++++++++++++++++++++ Vitamin D goal  is between 70-100.  Please make sure that you are taking your Vitamin D as directed.  It is very important as a natural anti-inflammatory  helping hair, skin, and nails, as well as reducing stroke and heart attack risk.  It helps your bones and helps with mood. It also decreases numerous cancer risks so please take it as directed.  Low Vit D is associated with a 200-300% higher risk for CANCER  and 200-300% higher risk for HEART   ATTACK  &  STROKE.   .....................................Marland Kitchen It is also associated with higher death rate at younger ages,  autoimmune diseases like Rheumatoid arthritis, Lupus, Multiple Sclerosis.    Also many other serious conditions, like depression, Alzheimer's Dementia, infertility, muscle aches, fatigue, fibromyalgia - just to name a few. ++++++++++++++++++ Recommend the book "The END of DIETING" by Dr Excell Seltzer  & the book "The END of DIABETES " by Dr Excell Seltzer At [email or web address] - get book & Audio CD's    Being diabetic has  a  300% increased risk for heart attack, stroke, cancer, and alzheimer- type vascular dementia. It is very important that you work harder with diet by avoiding all foods that are white. Avoid white rice (brown & wild rice is OK), white potatoes (sweetpotatoes in moderation is OK), White bread or wheat bread or anything made out of white flour like bagels, donuts, rolls, buns, biscuits, cakes, pastries, cookies, pizza crust, and pasta (made from white flour & egg whites) - vegetarian pasta or spinach or wheat pasta is OK. Multigrain breads like Arnold's or [employer], or multigrain sandwich thins or flatbreads.  Diet, exercise and weight loss can reverse and cure diabetes in the early stages.  Diet, exercise and weight loss is very important in the control and prevention of complications of diabetes which affects every system in your body, ie. Brain - dementia/stroke, eyes - glaucoma/blindness, heart - heart attack/heart failure, kidneys - dialysis, stomach - gastric paralysis, intestines - malabsorption, nerves - severe painful neuritis, circulation - gangrene & loss of a leg(s), and finally cancer and Alzheimers.    I recommend avoid fried & greasy foods,  sweets/candy, white rice (brown or wild  rice or Quinoa is OK), white potatoes (sweet potatoes are OK) - anything made from white flour - bagels, doughnuts, rolls, buns, biscuits,white and wheat breads, pizza crust and traditional pasta made of white flour & egg white(vegetarian pasta or spinach or wheat pasta is OK).  Multi-grain bread is OK - like multi-grain flat bread or sandwich thins. Avoid alcohol in excess. Exercise is also important.    Eat all the vegetables you want - avoid meat, especially red meat and dairy - especially cheese.  Cheese is the most concentrated form of trans-fats which is the worst thing to clog up our arteries. Veggie cheese is OK which can be found in the fresh produce section at [employer] or [employer] or  [employer]  +++++++++++++++++++ DASH Eating Plan  DASH stands for "Dietary Approaches to Stop Hypertension."   The DASH eating plan is a healthy eating plan that has been shown to reduce high blood pressure (hypertension). Additional health benefits may include reducing the risk of type 2 diabetes mellitus, heart disease, and stroke. The DASH eating plan may also help with weight loss. WHAT DO I NEED TO KNOW ABOUT THE DASH EATING PLAN? For the DASH eating plan, you will follow these general guidelines:  Choose foods with a percent daily value for sodium of less than 5% (as listed on the food label).  Use salt-free seasonings or herbs instead of table salt or sea salt.  Check with your health care provider or pharmacist before using salt substitutes.  Eat lower-sodium products, often labeled as "lower sodium" or "no salt added."  Eat fresh foods.  Eat more vegetables, fruits, and low-fat dairy products.  Choose whole grains. Look for the word "whole" as the first word in the ingredient list.  Choose fish   Limit sweets, desserts, sugars, and sugary drinks.  Choose heart-healthy fats.  Eat veggie cheese   Eat more home-cooked food and less restaurant, buffet, and fast food.  Limit fried foods.  Cook foods using methods other than frying.  Limit canned vegetables. If you do use them, rinse them well to decrease the sodium.  When eating at a restaurant, ask that your food be prepared with less salt, or no salt if possible.                      WHAT FOODS CAN I EAT? Read Dr Fara Olden Fuhrman's books on The End of Dieting & The End of Diabetes  Grains Whole grain or whole wheat bread. Brown rice. Whole grain or whole wheat pasta. Quinoa, bulgur, and whole grain cereals. Low-sodium cereals. Corn or whole wheat flour tortillas. Whole grain cornbread. Whole grain crackers. Low-sodium crackers.  Vegetables Fresh or frozen vegetables (raw, steamed, roasted, or grilled). Low-sodium or  reduced-sodium tomato and vegetable juices. Low-sodium or reduced-sodium tomato sauce and paste. Low-sodium or reduced-sodium canned vegetables.   Fruits All fresh, canned (in natural juice), or frozen fruits.  Protein Products  All fish and seafood.  Dried beans, peas, or lentils. Unsalted nuts and seeds. Unsalted canned beans.  Dairy Low-fat dairy products, such as skim or 1% milk, 2% or reduced-fat cheeses, low-fat ricotta or cottage cheese, or plain low-fat yogurt. Low-sodium or reduced-sodium cheeses.  Fats and Oils Tub margarines without trans fats. Light or reduced-fat mayonnaise and salad dressings (reduced sodium). Avocado. Safflower, olive, or canola oils. Natural peanut or almond butter.  Other Unsalted popcorn and pretzels. The items listed above may not be a complete list of recommended foods  or beverages. Contact your dietitian for more options.  +++++++++++++++  WHAT FOODS ARE NOT RECOMMENDED? Grains/ White flour or wheat flour White bread. White pasta. White rice. Refined cornbread. Bagels and croissants. Crackers that contain trans fat.  Vegetables  Creamed or fried vegetables. Vegetables in a . Regular canned vegetables. Regular canned tomato sauce and paste. Regular tomato and vegetable juices.  Fruits Dried fruits. Canned fruit in light or heavy syrup. Fruit juice.  Meat and Other Protein Products Meat in general - RED meat & White meat.  Fatty cuts of meat. Ribs, chicken wings, all processed meats as bacon, sausage, bologna, salami, fatback, hot dogs, bratwurst and packaged luncheon meats.  Dairy Whole or 2% milk, cream, half-and-half, and cream cheese. Whole-fat or sweetened yogurt. Full-fat cheeses or blue cheese. Non-dairy creamers and whipped toppings. Processed cheese, cheese spreads, or cheese curds.  Condiments Onion and garlic salt, seasoned salt, table salt, and sea salt. Canned and packaged gravies. Worcestershire sauce. Tartar sauce. Barbecue  sauce. Teriyaki sauce. Soy sauce, including reduced sodium. Steak sauce. Fish sauce. Oyster sauce. Cocktail sauce. Horseradish. Ketchup and mustard. Meat flavorings and tenderizers. Bouillon cubes. Hot sauce. Tabasco sauce. Marinades. Taco seasonings. Relishes.  Fats and Oils Butter, stick margarine, lard, shortening and bacon fat. Coconut, palm kernel, or palm oils. Regular salad dressings.  Pickles and olives. Salted popcorn and pretzels.  The items listed above may not be a complete list of foods and beverages to avoid.

## 2020-06-03 NOTE — Progress Notes (Signed)
Comprehensive Evaluation &  Examination      This very nice 74 y.o.  DWF presents for a  comprehensive evaluation and management of multiple medical co-morbidities.  Patient has been followed for HTN, HLD, Prediabetes  and Vitamin D Deficiency.      In May , 2021, patient underwent  T9- S2  surgery performed  by Dr. Jenne Campus at [facility] and has had Lt hip pain since       Labile HTN predates since  2018. Patient's BP has been controlled at home and patient denies any cardiac symptoms as chest pain, palpitations, shortness of breath, dizziness or ankle swelling. Today's BP is at goal - 126/82.   Patient has CKD 2 (GFR 67 ).     Patient's hyperlipidemia is controlled with diet and Pravastatin. Patient denies myalgias or other medication SE's. Last lipids were at goal:  Lab Results  Component Value Date   CHOL 150 11/11/2019   HDL 60 11/11/2019   LDLCALC 74 11/11/2019   TRIG 76 11/11/2019   CHOLHDL 2.5 11/11/2019       Patient has hx/o prediabetes (A1c 5.8% /2015) and patient denies reactive hypoglycemic symptoms, visual blurring, diabetic polys or paresthesias. Last A1c was Normal & at goal:  Lab Results  Component Value Date   HGBA1C 5.5 05/08/2019       Finally, patient has history of Vitamin D Deficiency ("36" /2016) and last Vitamin D was at goal:  Lab Results  Component Value Date   VD25OH 81 05/08/2019    Current Outpatient Medications on File Prior to Visit  Medication Sig  . ALPRAZolam  0.25 MG tab Take 1/2 to 1 tablet 2 to 3 x / day ONLY if needed   . VITAMIN C  500 mg 2  times daily.  Marland Kitchen VITAMIN D  Takes 10000 to 12000 units daily.  . diclofenac 1 % GEL Apply 2 g topically 4 times daily.  . DULoxetine  60 MG cap TAKE 1 CAPSULE  EVERY DAY  . estradiol 0.5 MG tablet TAKE 1 TABLET  EVERY DAY  . fexofenadine 180 MG tablet Take  daily.  Marland Kitchen gabapentin  300 MG cap Take 1 capsule 3 times daily.  . hydrochlorothiazide  25 MG  Take 1/2 -1 tablet daily   . NORCO  5-325 MG tablet Take 1 tablet every 6 hours as needed   . pravastatin  40 MG tablet Take 1 tablet at Bedtime for Cholesterol  . progesterone  100 MG cap TAKE ONE CAPSULE DAILY  . MAXALT 10 MG tablet Take 1 tablet for Migraine /may repeat 1 x in 2 hrs     Allergies  Allergen Reactions  . Augmentin [Amoxicillin-Pot Clavulanate] Diarrhea  . Doxycycline Nausea Only  . Egg White (Diagnostic) Nausea Only  . Prozac [Fluoxetine Hcl] Other (See Comments)    Disoriented   Past Medical History:  Diagnosis Date  . Allergy   . Anemia   . Anxiety   . Depression   . Hyperlipidemia   . Hypertension   . Lumbar stenosis   . Migraines   . Prediabetes    Health Maintenance  Topic Date Due  . COVID-19 Vaccine (1) Never done  . DEXA SCAN  02/13/2012  . INFLUENZA VACCINE  03/15/2020  . Fecal DNA (Cologuard)  10/26/2020  . MAMMOGRAM  10/27/2020  . TETANUS/TDAP  07/04/2026  . Hepatitis C Screening  Completed  . PNA vac Low Risk Adult  Completed   Immunization History  Administered Date(s) Administered  . Influenza, High Dose Seasonal PF 05/29/2014, 06/02/2015, 06/16/2017, 05/09/2018, 05/09/2019  . Influenza,inj,quad, With Preservative 08/12/2013, 07/04/2016  . Pneumococcal Conjugate-13 04/07/2015  . Pneumococcal Polysaccharide-23 05/29/2013  . Td 08/15/2005, 07/04/2016    Cologard - 10/26/2017 - Negative - Recc 3 yr f/u due Mar 2022  Last MGM - 10/28/2019 - Laurin Coder, FNP at [facility]  Past Surgical History:  Procedure Laterality Date  . BREAST LUMPECTOMY Bilateral   . CHOLECYSTECTOMY  1999  . SPINE SURGERY     Family History  Problem Relation Age of Onset  . Stroke Mother   . Hypertension Mother   . Heart attack Father 63  . Other Maternal Grandmother        blood clot after surgery  . Heart attack Paternal Grandmother    Social History   Tobacco Use  . Smoking status: Never Smoker  . Smokeless tobacco: Never Used  Vaping Use  . Vaping Use: Never used  Substance  Use Topics  . Alcohol use: Yes    Alcohol/week: 2.0 standard drinks    Types: 2 Glasses of wine per week  . Drug use: No    ROS Constitutional: Denies fever, chills, weight loss/gain, headaches, insomnia,  night sweats, and change in appetite. Does c/o fatigue. Eyes: Denies redness, blurred vision, diplopia, discharge, itchy, watery eyes.  ENT: Denies discharge, congestion, post nasal drip, epistaxis, sore throat, earache, hearing loss, dental pain, Tinnitus, Vertigo, Sinus pain, snoring.  Cardio: Denies chest pain, palpitations, irregular heartbeat, syncope, dyspnea, diaphoresis, orthopnea, PND, claudication, edema Respiratory: denies cough, dyspnea, DOE, pleurisy, hoarseness, laryngitis, wheezing.  Gastrointestinal: Denies dysphagia, heartburn, reflux, water brash, pain, cramps, nausea, vomiting, bloating, diarrhea, constipation, hematemesis, melena, hematochezia, jaundice, hemorrhoids Genitourinary: Denies dysuria, frequency, urgency, nocturia, hesitancy, discharge, hematuria, flank pain Breast: Breast lumps, nipple discharge, bleeding.  Musculoskeletal: Denies arthralgia, myalgia, stiffness, Jt. Swelling, pain, limp, and strain/sprain. Denies falls. Skin: Denies puritis, rash, hives, warts, acne, eczema, changing in skin lesion Neuro: No weakness, tremor, incoordination, spasms, paresthesia, pain Psychiatric: Denies confusion, memory loss, sensory loss. Denies Depression. Endocrine: Denies change in weight, skin, hair change, nocturia, and paresthesia, diabetic polys, visual blurring, hyper / hypo glycemic episodes.  Heme/Lymph: No excessive bleeding, bruising, enlarged lymph nodes.  Physical Exam  BP 126/82   Pulse 72   Temp (!) 97.2 F (36.2 C)   Resp 16   Ht 5' 3.5" (1.613 m)   Wt 152 lb 9.6 oz (69.2 kg)   BMI 26.61 kg/m   General Appearance: Well nourished, well groomed and in no apparent distress.  Eyes: PERRLA, EOMs, conjunctiva no swelling or erythema, normal fundi and  vessels. Sinuses: No frontal/maxillary tenderness ENT/Mouth: EACs patent / TMs  nl. Nares clear without erythema, swelling, mucoid exudates. Oral hygiene is good. No erythema, swelling, or exudate. Tongue normal, non-obstructing. Tonsils not swollen or erythematous. Hearing normal.  Neck: Supple, thyroid not palpable. No bruits, nodes or JVD. Respiratory: Respiratory effort normal.  BS equal and clear bilateral without rales, rhonci, wheezing or stridor. Cardio: Heart sounds are normal with regular rate and rhythm and no murmurs, rubs or gallops. Peripheral pulses are normal and equal bilaterally without edema. No aortic or femoral bruits. Chest: symmetric with normal excursions and percussion. Breasts: Symmetric, without lumps, nipple discharge, retractions, or fibrocystic changes.  Abdomen: Flat, soft with bowel sounds active. Nontender, no guarding, rebound, hernias, masses, or organomegaly.  Lymphatics: Non tender without lymphadenopathy.  Musculoskeletal: Full ROM all peripheral extremities, joint stability, 5/5 strength,  and normal gait. Skin: Warm and dry without rashes, lesions, cyanosis, clubbing or  ecchymosis.  Neuro: Cranial nerves intact, reflexes equal bilaterally. Normal muscle tone, no cerebellar symptoms. Sensation intact.  Pysch: Alert and oriented X 3, normal affect, Insight and Judgment appropriate.   Assessment and Plan  1. Essential hypertension  - EKG 12-Lead - Urinalysis, Routine w reflex microscopic - Microalbumin / creatinine urine ratio - CBC with Differential/Platelet - Magnesium - TSH - COMPLETE METABOLIC PANEL WITH GFR  2. Hyperlipidemia, mixed  - EKG 12-Lead - Lipid panel - TSH  3. Abnormal glucose  - EKG 12-Lead - Insulin, random - Hemoglobin A1c  4. Vitamin D deficiency  - VITAMIN D 25 Hydroxy  5. CKD  stage 2   - Urinalysis, Routine w reflex microscopic - Microalbumin / creatinine urine ratio - COMPLETE METABOLIC PANEL WITH GFR  6.  Chronic venous insufficiency   7. Screening for colorectal cancer  - POC Hemoccult Bld/Stl  8. Screening for ischemic heart disease  - EKG 12-Lead  9. Family history of ischemic heart disease  - EKG 12-Lead  10. Medication management  - Urinalysis, Routine w reflex microscopic - Microalbumin / creatinine urine ratio - CBC with Differential/Platelet - Magnesium - Lipid panel - TSH - Insulin, random - VITAMIN D 25 Hydroxy - Hemoglobin A1c - COMPLETE METABOLIC PANEL WITH GFR            Patient was counseled in prudent diet to achieve/maintain BMI less than 25 for weight control, BP monitoring, regular exercise and medications. Discussed med's effects and SE's. Screening labs and tests as requested with regular follow-up as recommended. Over 40 minutes of exam, counseling, chart review and high complex critical decision making was performed.   Kirtland Bouchard, MD

## 2020-06-04 ENCOUNTER — Ambulatory Visit (INDEPENDENT_AMBULATORY_CARE_PROVIDER_SITE_OTHER): Payer: Medicare Other | Admitting: Internal Medicine

## 2020-06-04 ENCOUNTER — Encounter: Payer: Self-pay | Admitting: Internal Medicine

## 2020-06-04 ENCOUNTER — Other Ambulatory Visit: Payer: Self-pay

## 2020-06-04 VITALS — BP 126/82 | HR 72 | Temp 97.2°F | Resp 16 | Ht 63.5 in | Wt 152.6 lb

## 2020-06-04 DIAGNOSIS — Z79899 Other long term (current) drug therapy: Secondary | ICD-10-CM

## 2020-06-04 DIAGNOSIS — Z136 Encounter for screening for cardiovascular disorders: Secondary | ICD-10-CM

## 2020-06-04 DIAGNOSIS — N182 Chronic kidney disease, stage 2 (mild): Secondary | ICD-10-CM

## 2020-06-04 DIAGNOSIS — Z1211 Encounter for screening for malignant neoplasm of colon: Secondary | ICD-10-CM

## 2020-06-04 DIAGNOSIS — I10 Essential (primary) hypertension: Secondary | ICD-10-CM | POA: Diagnosis not present

## 2020-06-04 DIAGNOSIS — G8929 Other chronic pain: Secondary | ICD-10-CM

## 2020-06-04 DIAGNOSIS — Z8249 Family history of ischemic heart disease and other diseases of the circulatory system: Secondary | ICD-10-CM

## 2020-06-04 DIAGNOSIS — F411 Generalized anxiety disorder: Secondary | ICD-10-CM

## 2020-06-04 DIAGNOSIS — E559 Vitamin D deficiency, unspecified: Secondary | ICD-10-CM | POA: Diagnosis not present

## 2020-06-04 DIAGNOSIS — R7309 Other abnormal glucose: Secondary | ICD-10-CM

## 2020-06-04 DIAGNOSIS — M545 Low back pain, unspecified: Secondary | ICD-10-CM

## 2020-06-04 DIAGNOSIS — E782 Mixed hyperlipidemia: Secondary | ICD-10-CM

## 2020-06-04 DIAGNOSIS — I872 Venous insufficiency (chronic) (peripheral): Secondary | ICD-10-CM

## 2020-06-04 MED ORDER — DULOXETINE HCL 30 MG PO CPEP: ORAL_CAPSULE | ORAL | 0 refills | Status: DC

## 2020-06-04 MED ORDER — ALPRAZOLAM 0.25 MG PO TABS: ORAL_TABLET | ORAL | 0 refills | Status: DC

## 2020-06-05 LAB — CBC WITH DIFFERENTIAL/PLATELET
Absolute Monocytes: 1017 cells/uL — ABNORMAL HIGH (ref 200–950)
Basophils Absolute: 54 cells/uL (ref 0–200)
Basophils Relative: 0.5 %
Eosinophils Absolute: 96 cells/uL (ref 15–500)
Eosinophils Relative: 0.9 %
HCT: 44.1 % (ref 35.0–45.0)
Hemoglobin: 14.5 g/dL (ref 11.7–15.5)
Lymphs Abs: 2964 cells/uL (ref 850–3900)
MCH: 29.2 pg (ref 27.0–33.0)
MCHC: 32.9 g/dL (ref 32.0–36.0)
MCV: 88.7 fL (ref 80.0–100.0)
MPV: 10.4 fL (ref 7.5–12.5)
Monocytes Relative: 9.5 %
Neutro Abs: 6570 cells/uL (ref 1500–7800)
Neutrophils Relative %: 61.4 %
Platelets: 345 10*3/uL (ref 140–400)
RBC: 4.97 10*6/uL (ref 3.80–5.10)
RDW: 14.1 % (ref 11.0–15.0)
Total Lymphocyte: 27.7 %
WBC: 10.7 10*3/uL (ref 3.8–10.8)

## 2020-06-05 LAB — COMPLETE METABOLIC PANEL WITHOUT GFR
AG Ratio: 2 (calc) (ref 1.0–2.5)
ALT: 12 U/L (ref 6–29)
AST: 17 U/L (ref 10–35)
Albumin: 4.4 g/dL (ref 3.6–5.1)
Alkaline phosphatase (APISO): 69 U/L (ref 37–153)
BUN/Creatinine Ratio: 17 (calc) (ref 6–22)
BUN: 19 mg/dL (ref 7–25)
CO2: 33 mmol/L — ABNORMAL HIGH (ref 20–32)
Calcium: 10 mg/dL (ref 8.6–10.4)
Chloride: 102 mmol/L (ref 98–110)
Creat: 1.11 mg/dL — ABNORMAL HIGH (ref 0.60–0.93)
GFR, Est African American: 57 mL/min/1.73m2 — ABNORMAL LOW
GFR, Est Non African American: 49 mL/min/1.73m2 — ABNORMAL LOW
Globulin: 2.2 g/dL (ref 1.9–3.7)
Glucose, Bld: 73 mg/dL (ref 65–99)
Potassium: 4 mmol/L (ref 3.5–5.3)
Sodium: 144 mmol/L (ref 135–146)
Total Bilirubin: 0.4 mg/dL (ref 0.2–1.2)
Total Protein: 6.6 g/dL (ref 6.1–8.1)

## 2020-06-05 LAB — URINALYSIS, ROUTINE W REFLEX MICROSCOPIC
Bilirubin Urine: NEGATIVE
Glucose, UA: NEGATIVE
Hgb urine dipstick: NEGATIVE
Leukocytes,Ua: NEGATIVE
Nitrite: NEGATIVE
Protein, ur: NEGATIVE
Specific Gravity, Urine: 1.031 (ref 1.001–1.03)
pH: <=5 (ref 5.0–8.0)

## 2020-06-05 LAB — VITAMIN D 25 HYDROXY (VIT D DEFICIENCY, FRACTURES): Vit D, 25-Hydroxy: 72 ng/mL (ref 30–100)

## 2020-06-05 LAB — HEMOGLOBIN A1C
Hgb A1c MFr Bld: 5.9 % of total Hgb — ABNORMAL HIGH (ref <5.7)
Mean Plasma Glucose: 123 (calc)
eAG (mmol/L): 6.8 (calc)

## 2020-06-05 LAB — INSULIN, RANDOM: Insulin: 31.9 u[IU]/mL — ABNORMAL HIGH

## 2020-06-05 LAB — LIPID PANEL
Cholesterol: 137 mg/dL (ref <200)
HDL: 52 mg/dL (ref >=50)
LDL Cholesterol (Calc): 65 mg/dL (calc)
Non-HDL Cholesterol (Calc): 85 mg/dL (calc) (ref <130)
Total CHOL/HDL Ratio: 2.6 (calc) (ref <5.0)
Triglycerides: 113 mg/dL (ref <150)

## 2020-06-05 LAB — MICROALBUMIN / CREATININE URINE RATIO
Creatinine, Urine: 270 mg/dL (ref 20–275)
Microalb Creat Ratio: 6 mcg/mg creat (ref <30)
Microalb, Ur: 1.6 mg/dL

## 2020-06-05 LAB — MAGNESIUM: Magnesium: 2.2 mg/dL (ref 1.5–2.5)

## 2020-06-05 LAB — TSH: TSH: 4.38 mIU/L (ref 0.40–4.50)

## 2020-06-05 NOTE — Progress Notes (Signed)
========================================================== -   Test results slightly outside the reference range are not unusual. If there is anything important, I will review this with you,  otherwise it is considered normal test values.  If you have further questions,  please do not hesitate to contact me at the office or via My Chart.  ==========================================================  -  Total Chol = 137  and LDL Chol = 65 - Both  Excellent   - Very low risk for Heart Attack  / Stroke =========================================================  - Vitamin D = 72 - Excellent - Please keep dose same ==========================================================  -  A1c - - average blood sugar is again slightly elevated, So. . . . . Marland Kitchen   - Avoid Sweets, Candy & White Stuff   - Rice, Potatoes, Breads &  Pasta ==========================================================  -  Kidney Functions (creatinine & GFR) suggest mild Dehydration, So. . . .   - It's very important to drink plenty for fluids - at least   6 bottles (16 oz) of water or liquids /day to Avoid Permanent Kidney Damage ==========================================================  -  All Else - CBC - U/A - Electrolytes - Liver - Magnesium & Thyroid    - all  Normal / OK ==========================================================

## 2020-06-06 DIAGNOSIS — Z20822 Contact with and (suspected) exposure to covid-19: Secondary | ICD-10-CM | POA: Diagnosis not present

## 2020-06-06 DIAGNOSIS — Z03818 Encounter for observation for suspected exposure to other biological agents ruled out: Secondary | ICD-10-CM | POA: Diagnosis not present

## 2020-06-08 ENCOUNTER — Other Ambulatory Visit: Payer: Self-pay | Admitting: Internal Medicine

## 2020-06-08 DIAGNOSIS — M545 Low back pain, unspecified: Secondary | ICD-10-CM

## 2020-06-08 DIAGNOSIS — G8929 Other chronic pain: Secondary | ICD-10-CM

## 2020-06-08 MED ORDER — DULOXETINE HCL 30 MG PO CPEP: ORAL_CAPSULE | ORAL | 0 refills | Status: DC

## 2020-06-09 ENCOUNTER — Telehealth: Payer: Self-pay

## 2020-06-09 NOTE — Telephone Encounter (Signed)
Prior Authorization for Duloxetine complete and approved.

## 2020-06-22 DIAGNOSIS — Z23 Encounter for immunization: Secondary | ICD-10-CM | POA: Diagnosis not present

## 2020-06-28 ENCOUNTER — Other Ambulatory Visit: Payer: Self-pay | Admitting: Adult Health

## 2020-07-20 DIAGNOSIS — H2513 Age-related nuclear cataract, bilateral: Secondary | ICD-10-CM | POA: Diagnosis not present

## 2020-07-20 DIAGNOSIS — H35013 Changes in retinal vascular appearance, bilateral: Secondary | ICD-10-CM | POA: Diagnosis not present

## 2020-07-20 DIAGNOSIS — H25013 Cortical age-related cataract, bilateral: Secondary | ICD-10-CM | POA: Diagnosis not present

## 2020-07-20 DIAGNOSIS — H18893 Other specified disorders of cornea, bilateral: Secondary | ICD-10-CM | POA: Diagnosis not present

## 2020-07-29 ENCOUNTER — Other Ambulatory Visit: Payer: Self-pay | Admitting: Internal Medicine

## 2020-08-04 DIAGNOSIS — D2262 Melanocytic nevi of left upper limb, including shoulder: Secondary | ICD-10-CM | POA: Diagnosis not present

## 2020-08-04 DIAGNOSIS — D2261 Melanocytic nevi of right upper limb, including shoulder: Secondary | ICD-10-CM | POA: Diagnosis not present

## 2020-08-04 DIAGNOSIS — D485 Neoplasm of uncertain behavior of skin: Secondary | ICD-10-CM | POA: Diagnosis not present

## 2020-08-04 DIAGNOSIS — L821 Other seborrheic keratosis: Secondary | ICD-10-CM | POA: Diagnosis not present

## 2020-08-04 DIAGNOSIS — D225 Melanocytic nevi of trunk: Secondary | ICD-10-CM | POA: Diagnosis not present

## 2020-08-04 DIAGNOSIS — D2271 Melanocytic nevi of right lower limb, including hip: Secondary | ICD-10-CM | POA: Diagnosis not present

## 2020-08-04 DIAGNOSIS — D2272 Melanocytic nevi of left lower limb, including hip: Secondary | ICD-10-CM | POA: Diagnosis not present

## 2020-09-21 ENCOUNTER — Other Ambulatory Visit: Payer: Self-pay

## 2020-09-21 ENCOUNTER — Other Ambulatory Visit: Payer: Self-pay | Admitting: Internal Medicine

## 2020-09-21 DIAGNOSIS — M545 Low back pain, unspecified: Secondary | ICD-10-CM

## 2020-09-21 DIAGNOSIS — G8929 Other chronic pain: Secondary | ICD-10-CM

## 2020-09-21 MED ORDER — RIZATRIPTAN BENZOATE 10 MG PO TABS
ORAL_TABLET | ORAL | 3 refills | Status: DC
Start: 2020-09-21 — End: 2021-11-15

## 2020-09-21 MED ORDER — PROGESTERONE MICRONIZED 100 MG PO CAPS
ORAL_CAPSULE | ORAL | 0 refills | Status: DC
Start: 2020-09-21 — End: 2020-11-22

## 2020-09-21 MED ORDER — DULOXETINE HCL 30 MG PO CPEP: ORAL_CAPSULE | ORAL | 0 refills | Status: DC

## 2020-09-21 MED ORDER — ESTRADIOL 0.5 MG PO TABS
ORAL_TABLET | ORAL | 0 refills | Status: DC
Start: 2020-09-21 — End: 2020-11-22

## 2020-09-22 ENCOUNTER — Other Ambulatory Visit: Payer: Self-pay

## 2020-09-22 MED ORDER — PRAVASTATIN SODIUM 40 MG PO TABS
ORAL_TABLET | ORAL | 3 refills | Status: DC
Start: 2020-09-22 — End: 2021-11-07

## 2020-10-07 ENCOUNTER — Other Ambulatory Visit: Payer: Self-pay

## 2020-10-07 ENCOUNTER — Ambulatory Visit: Payer: Self-pay

## 2020-10-07 ENCOUNTER — Encounter: Payer: Self-pay | Admitting: Specialist

## 2020-10-07 ENCOUNTER — Ambulatory Visit (INDEPENDENT_AMBULATORY_CARE_PROVIDER_SITE_OTHER): Payer: Medicare Other | Admitting: Specialist

## 2020-10-07 ENCOUNTER — Ambulatory Visit (INDEPENDENT_AMBULATORY_CARE_PROVIDER_SITE_OTHER): Payer: Medicare Other

## 2020-10-07 VITALS — BP 144/84 | HR 65 | Ht 63.5 in | Wt 153.0 lb

## 2020-10-07 DIAGNOSIS — Z4889 Encounter for other specified surgical aftercare: Secondary | ICD-10-CM | POA: Diagnosis not present

## 2020-10-07 DIAGNOSIS — M25559 Pain in unspecified hip: Secondary | ICD-10-CM

## 2020-10-07 DIAGNOSIS — M4325 Fusion of spine, thoracolumbar region: Secondary | ICD-10-CM

## 2020-10-07 DIAGNOSIS — Z981 Arthrodesis status: Secondary | ICD-10-CM

## 2020-10-07 MED ORDER — HYDROCODONE-ACETAMINOPHEN 5-325 MG PO TABS: 1.0000 | ORAL_TABLET | Freq: Four times a day (QID) | ORAL | 0 refills | Status: DC | PRN

## 2020-10-07 NOTE — Progress Notes (Signed)
Office Visit Note   Patient: Rebekah Hamilton           Date of Birth: 07-26-1946           MRN: [id number] Visit Date: 10/07/2020              Requested by: Unk Pinto, [address] [place],  [place] [address] PCP: Unk Pinto, MD   Assessment & Plan: Visit Diagnoses:  1. Hip pain   2. Fusion of spine of thoracolumbar region   3. Encounter for other specified surgical aftercare   4. Arthrodesis status     Plan: Avoid frequent bending and stooping  No lifting greater than 10 lbs. May use ice or moist heat for pain. Weight loss is of benefit. May use a walker of a cane on the opposite side to assess if decreasing the weight bearing helps.  Exercise is important to improve your indurance and does allow people to function better inspite of back pain. Hydrocodone for discomfort. Follow-Up Instructions: Return in about 4 weeks (around 11/04/2020).   Orders:  Orders Placed This Encounter  Procedures  . XR Lumbar Spine 2-3 Views   No orders of the defined types were placed in this encounter.     Procedures: No procedures performed   Clinical Data: No additional findings.   Subjective: Chief Complaint  Patient presents with  . Right Hip - Pain    75 year old female with history of long collapsing degenerative thoracolumbar scoliosis. She has undergone a long thoracolumbar fusion from T9 to Sacrum with caudal single iliac wing screws, surgery performed 12/23/2019 [facility] by Dr. Harl Bowie. She reports that she was hospitalized for about a week had an osteotomy at the L3 level, radiographs demonstrate interbody cages at T12-L1, L1-2, L2-3 and L4-5 and L5-S1 with pedicle screw fixation T9 to S1 with additional bilateral iliac wing screws. She reports that the surgery recovery was uneventful and she has been quite pain free, able to stop all narcotic medications in fall. She notes that there has been increasing pain and discomfort into the  right lateral hip and radiation along the lateraal right thigh to the knee. She has weakness in the right leg and feeling like the right knee might  Give away. There is pain in the mid portion of the right buttock. She reports that the discomfort is present when she is using the right leg while driving and when she gets home sometimes has to get off her feet to relieve the right buttock and leg pain. No bowel or bladder difficulty. She notes the pain improves with sitting or taking weight off the right leg. She has some numbness and parethesias both legs.    Review of Systems  Constitutional: Negative for appetite change, chills, diaphoresis, fatigue, fever and unexpected weight change.  HENT: Negative.  Negative for congestion, dental problem, drooling, ear discharge, ear pain, facial swelling, hearing loss, mouth sores, nosebleeds, postnasal drip, rhinorrhea, sinus pressure, sinus pain, sneezing, sore throat, tinnitus, trouble swallowing and voice change.   Eyes: Negative.   Respiratory: Negative.   Cardiovascular: Negative.   Gastrointestinal: Negative.   Endocrine: Negative.   Genitourinary: Negative.   Musculoskeletal: Positive for gait problem. Negative for arthralgias, back pain, joint swelling, myalgias, neck pain and neck stiffness.  Skin: Negative.   Neurological: Positive for weakness and numbness.  Hematological: Negative.  Negative for adenopathy. Does not bruise/bleed easily.  Psychiatric/Behavioral: Negative.  Negative for agitation, behavioral problems,  confusion, decreased concentration, dysphoric mood, hallucinations, self-injury, sleep disturbance and suicidal ideas. The patient is not nervous/anxious and is not hyperactive.      Objective: Vital Signs: BP (!) 144/84 (BP Location: Left Arm, Patient Position: Sitting)   Pulse 65   Ht 5' 3.5" (1.613 m)   Wt 153 lb (69.4 kg)   BMI 26.68 kg/m   Physical Exam Constitutional:      Appearance: She is well-developed and  well-nourished.  HENT:     Head: Normocephalic and atraumatic.  Eyes:     Extraocular Movements: EOM normal.     Pupils: Pupils are equal, round, and reactive to light.  Pulmonary:     Effort: Pulmonary effort is normal.     Breath sounds: Normal breath sounds.  Abdominal:     General: Bowel sounds are normal.     Palpations: Abdomen is soft.  Musculoskeletal:        General: Normal range of motion.     Cervical back: Normal range of motion and neck supple.     Lumbar back: Negative right straight leg raise test and negative left straight leg raise test.  Skin:    General: Skin is warm and dry.  Neurological:     Mental Status: She is alert and oriented to person, place, and time.  Psychiatric:        Mood and Affect: Mood and affect normal.        Behavior: Behavior normal.        Thought Content: Thought content normal.        Judgment: Judgment normal.     Back Exam   Tenderness  The patient is experiencing tenderness in the sacroiliac.  Range of Motion  Extension: normal  Flexion: normal  Lateral bend right: normal  Lateral bend left: normal  Rotation right: normal  Rotation left: normal   Muscle Strength  Right Quadriceps:  4/5  Left Quadriceps:  5/5  Right Hamstrings:  5/5  Left Hamstrings:  5/5   Tests  Straight leg raise right: negative Straight leg raise left: negative  Reflexes  Patellar: 0/4 Achilles: 0/4 Biceps: 2/4  Other  Toe walk: normal Heel walk: normal Sensation: normal Gait: normal  Erythema: no back redness Scars: absent  Comments:  Weak right knee extension 4/5, hip flexion is normal, right foot DF is 4/5      Specialty Comments:  No specialty comments available.  Imaging: No results found.   PMFS History: Patient Active Problem List   Diagnosis Date Noted  . Chronic venous insufficiency 05/07/2019  . History of 2019 novel coronavirus disease (COVID-19) 05/07/2019  . Chronic left-sided low back pain with left-sided  sciatica 07/02/2018  . OAB (overactive bladder) 04/02/2018  . CKD (chronic kidney disease), symptom management only, stage 2 (mild) 09/24/2017  . Encounter for Medicare annual wellness exam 06/02/2015  . Glaucoma 06/02/2015  . Vitamin D deficiency 10/21/2014  . Medication management 12/01/2013  . Hyperlipidemia, mixed   . Essential hypertension   . Abnormal glucose   . Migraines   . Allergy   . Anxiety   . Depression, major, recurrent (Strong)   . Lumbar stenosis    Past Medical History:  Diagnosis Date  . Allergy   . Anemia   . Anxiety   . Depression   . Hyperlipidemia   . Hypertension   . Lumbar stenosis   . Migraines   . Prediabetes     Family History  Problem Relation Age of Onset  .  Stroke Mother   . Hypertension Mother   . Heart attack Father 68  . Other Maternal Grandmother        blood clot after surgery  . Heart attack Paternal Grandmother     Past Surgical History:  Procedure Laterality Date  . BREAST LUMPECTOMY Bilateral   . CHOLECYSTECTOMY  1999  . SPINE SURGERY     Social History   Occupational History  . Occupation: Optometrist  Tobacco Use  . Smoking status: Never Smoker  . Smokeless tobacco: Never Used  Vaping Use  . Vaping Use: Never used  Substance and Sexual Activity  . Alcohol use: Yes    Alcohol/week: 2.0 standard drinks    Types: 2 Glasses of wine per week  . Drug use: No  . Sexual activity: Not Currently

## 2020-10-07 NOTE — Patient Instructions (Addendum)
Avoid frequent bending and stooping  No lifting greater than 10 lbs. May use ice or moist heat for pain. Weight loss is of benefit. May use a walker of a cane on the opposite side to assess if decreasing the weight bearing helps.  Exercise is important to improve your indurance and does allow people to function better inspite of back pain. Hydrocodone for discomfort. CT of the thoracic, lumbar spine and pelvis ordered.

## 2020-10-17 ENCOUNTER — Ambulatory Visit
Admission: RE | Admit: 2020-10-17 | Discharge: 2020-10-17 | Disposition: A | Discharge status: Home / Self Care | Payer: Medicare Other | Source: Ambulatory Visit | Attending: Specialist | Admitting: Specialist

## 2020-10-17 ENCOUNTER — Other Ambulatory Visit: Payer: Self-pay

## 2020-10-17 DIAGNOSIS — Z981 Arthrodesis status: Secondary | ICD-10-CM | POA: Diagnosis not present

## 2020-10-17 DIAGNOSIS — Z4889 Encounter for other specified surgical aftercare: Secondary | ICD-10-CM

## 2020-10-17 DIAGNOSIS — M5134 Other intervertebral disc degeneration, thoracic region: Secondary | ICD-10-CM | POA: Diagnosis not present

## 2020-10-17 DIAGNOSIS — M4326 Fusion of spine, lumbar region: Secondary | ICD-10-CM | POA: Diagnosis not present

## 2020-10-26 ENCOUNTER — Encounter: Payer: Self-pay | Admitting: Specialist

## 2020-10-26 ENCOUNTER — Ambulatory Visit (INDEPENDENT_AMBULATORY_CARE_PROVIDER_SITE_OTHER): Payer: Medicare Other | Admitting: Specialist

## 2020-10-26 ENCOUNTER — Other Ambulatory Visit: Payer: Self-pay

## 2020-10-26 VITALS — BP 152/88 | HR 79 | Ht 63.0 in | Wt 153.0 lb

## 2020-10-26 DIAGNOSIS — M533 Sacrococcygeal disorders, not elsewhere classified: Secondary | ICD-10-CM | POA: Diagnosis not present

## 2020-10-26 DIAGNOSIS — T8484XA Pain due to internal orthopedic prosthetic devices, implants and grafts, initial encounter: Secondary | ICD-10-CM

## 2020-10-26 NOTE — Patient Instructions (Signed)
Avoid bending, stooping and avoid lifting weights greater than 10 lbs. Avoid prolong standing and walking. Avoid frequent bending and stooping  No lifting greater than 10 lbs. May use ice or moist heat for pain. Weight loss is of benefit. Handicap license is approved.  

## 2020-10-26 NOTE — Progress Notes (Signed)
Office Visit Note   Patient: Giovanna Kemmerer           Date of Birth: 09/23/1945           MRN: [id number] Visit Date: 10/26/2020              Requested by: Unk Pinto, [address] [place],  [place] [address] PCP: Unk Pinto, MD   Assessment & Plan: Visit Diagnoses:  1. Painful orthopaedic hardware [facility])     Plan: Avoid bending, stooping and avoid lifting weights greater than 10 lbs. Avoid prolong standing and walking. Avoid frequent bending and stooping  No lifting greater than 10 lbs. May use ice or moist heat for pain. Weight loss is of benefit. Handicap license is approved. Please get a copy of the CT scans from [facility] lumbar and thoracic.  Follow-Up Instructions: Return in about 4 weeks (around 11/23/2020).   Orders:  No orders of the defined types were placed in this encounter.  No orders of the defined types were placed in this encounter.     Procedures: No procedures performed   Clinical Data: Findings:  Narrative & Impression CLINICAL DATA:  Follow-up thoracic spine fusion May 2021  EXAM: CT THORACIC AND LUMBAR SPINE WITHOUT CONTRAST  TECHNIQUE: Multidetector CT imaging of the thoracic and lumbar spine was performed without contrast. Multiplanar CT image reconstructions were also generated.  COMPARISON:  Lumbar MRI 09/01/2019  FINDINGS: CT THORACIC AND LUMBAR SPINE FINDINGS  Alignment: Mild lower thoracic levocurvature.  No listhesis.  Vertebrae: No evidence of fracture or bone lesion.  T9 to pelvic fusion using rod and pedicle screws. Discectomies were performed at T12-L1, L1-2, L2-3, L4-5, and L5-S1.  Bridging bone is seen at T12-L1, L1-2, and L2-3 although some vacuum phenomenon is also seen at the T12-L1 and L1-2 disc spaces. No definite solid arthrodesis is seen at L4-5 and L5-S1 intervertebral spaces or across the posterior elements. No arthrodesis seen posterolaterally at L3-4  where there is a large volume of gas in the disc space. Posterior-lateral arthrodesis is suspected at T9 to L3.  Hardware and pedicle screws appear intact. The left T9 pedicle screw enters the T8-9 disc space.  There is lucency around the bilateral iliac screws and likely to a mild degree around the L5 screws.  Paraspinal and other soft tissues: No swelling or mass is noted.  Disc levels: Technique and streak artifact of the limits assessment canal. Probable improvement in right L5 foraminal patency. Effacement of regional fat is most likely granulation tissue.  Advanced degenerative disc narrowing with endplate erosions at T7-0 to T8-9.  IMPRESSION: 1. T9 to pelvic fusion. 2. Posterior-lateral arthrodesis appears solid from at least T9-L3. 3. Solid interbody arthrodesis at T12-L1 to L2-3. There has been some incorporation of L4-5 and L5-S1 discectomy grafts, but no convincing inter body arthrodesis at these levels. 4. Lucency around the pelvic screws and likely around the L5 screws. 5. The left T9 pedicle screw extends into the very degenerated T8-9 disc space. 6. Improved bony patency of the right L5-S1 foramen.   Electronically Signed   By: Monte Fantasia M.D.   On: 10/18/2020 08:42   Narrative & Impression CLINICAL DATA:  Follow-up thoracic spine fusion May 2021  EXAM: CT THORACIC AND LUMBAR SPINE WITHOUT CONTRAST  TECHNIQUE: Multidetector CT imaging of the thoracic and lumbar spine was performed without contrast. Multiplanar CT image reconstructions were also generated.  COMPARISON:  Lumbar MRI 09/01/2019  FINDINGS: CT THORACIC  AND LUMBAR SPINE FINDINGS  Alignment: Mild lower thoracic levocurvature.  No listhesis.  Vertebrae: No evidence of fracture or bone lesion.  T9 to pelvic fusion using rod and pedicle screws. Discectomies were performed at T12-L1, L1-2, L2-3, L4-5, and L5-S1.  Bridging bone is seen at T12-L1, L1-2, and L2-3  although some vacuum phenomenon is also seen at the T12-L1 and L1-2 disc spaces. No definite solid arthrodesis is seen at L4-5 and L5-S1 intervertebral spaces or across the posterior elements. No arthrodesis seen posterolaterally at L3-4 where there is a large volume of gas in the disc space. Posterior-lateral arthrodesis is suspected at T9 to L3.  Hardware and pedicle screws appear intact. The left T9 pedicle screw enters the T8-9 disc space.  There is lucency around the bilateral iliac screws and likely to a mild degree around the L5 screws.  Paraspinal and other soft tissues: No swelling or mass is noted.  Disc levels: Technique and streak artifact of the limits assessment canal. Probable improvement in right L5 foraminal patency. Effacement of regional fat is most likely granulation tissue.  Advanced degenerative disc narrowing with endplate erosions at R5-1 to T8-9.  IMPRESSION: 1. T9 to pelvic fusion. 2. Posterior-lateral arthrodesis appears solid from at least T9-L3. 3. Solid interbody arthrodesis at T12-L1 to L2-3. There has been some incorporation of L4-5 and L5-S1 discectomy grafts, but no convincing inter body arthrodesis at these levels. 4. Lucency around the pelvic screws and likely around the L5 screws. 5. The left T9 pedicle screw extends into the very degenerated T8-9 disc space. 6. Improved bony patency of the right L5-S1 foramen.   Electronically Signed   By: Monte Fantasia M.D.   On: 10/18/2020 08:42   Narrative & Impression CLINICAL DATA:  Follow-up thoracic spine fusion May 2021  EXAM: CT THORACIC AND LUMBAR SPINE WITHOUT CONTRAST  TECHNIQUE: Multidetector CT imaging of the thoracic and lumbar spine was performed without contrast. Multiplanar CT image reconstructions were also generated.  COMPARISON:  Lumbar MRI 09/01/2019  FINDINGS: CT THORACIC AND LUMBAR SPINE FINDINGS  Alignment: Mild lower thoracic levocurvature.  No  listhesis.  Vertebrae: No evidence of fracture or bone lesion.  T9 to pelvic fusion using rod and pedicle screws. Discectomies were performed at T12-L1, L1-2, L2-3, L4-5, and L5-S1.  Bridging bone is seen at T12-L1, L1-2, and L2-3 although some vacuum phenomenon is also seen at the T12-L1 and L1-2 disc spaces. No definite solid arthrodesis is seen at L4-5 and L5-S1 intervertebral spaces or across the posterior elements. No arthrodesis seen posterolaterally at L3-4 where there is a large volume of gas in the disc space. Posterior-lateral arthrodesis is suspected at T9 to L3.  Hardware and pedicle screws appear intact. The left T9 pedicle screw enters the T8-9 disc space.  There is lucency around the bilateral iliac screws and likely to a mild degree around the L5 screws.  Paraspinal and other soft tissues: No swelling or mass is noted.  Disc levels: Technique and streak artifact of the limits assessment canal. Probable improvement in right L5 foraminal patency. Effacement of regional fat is most likely granulation tissue.  Advanced degenerative disc narrowing with endplate erosions at O8-4 to T8-9.  IMPRESSION: 1. T9 to pelvic fusion. 2. Posterior-lateral arthrodesis appears solid from at least T9-L3. 3. Solid interbody arthrodesis at T12-L1 to L2-3. There has been some incorporation of L4-5 and L5-S1 discectomy grafts, but no convincing inter body arthrodesis at these levels. 4. Lucency around the pelvic screws and likely around the  L5 screws. 5. The left T9 pedicle screw extends into the very degenerated T8-9 disc space. 6. Improved bony patency of the right L5-S1 foramen.   Electronically Signed   By: Monte Fantasia M.D.   On: 10/18/2020 08:42       Subjective: Chief Complaint  Patient presents with  . Right Hip - Pain  . Lower Back - Pain    HPI  Review of Systems   Objective: Vital Signs: BP (!) 152/88 (BP Location: Left Arm, Patient  Position: Sitting, Cuff Size: Normal)   Pulse 79   Ht 5\' 3"  (1.6 m)   Wt 153 lb (69.4 kg)   BMI 27.10 kg/m   Physical Exam Musculoskeletal:     Lumbar back: Negative right straight leg raise test and negative left straight leg raise test.     Back Exam   Tenderness  The patient is experiencing tenderness in the lumbar.  Range of Motion  Extension: abnormal  Flexion: abnormal  Lateral bend right: abnormal  Lateral bend left: abnormal  Rotation right: abnormal  Rotation left: abnormal   Muscle Strength  Right Quadriceps:  4/5  Left Quadriceps:  5/5  Right Hamstrings:  5/5  Left Hamstrings:  5/5   Tests  Straight leg raise right: negative Straight leg raise left: negative  Reflexes  Patellar: 0/4      Specialty Comments:  No specialty comments available.  Imaging: No results found.   PMFS History: Patient Active Problem List   Diagnosis Date Noted  . Chronic venous insufficiency 05/07/2019  . History of 2019 novel coronavirus disease (COVID-19) 05/07/2019  . Chronic left-sided low back pain with left-sided sciatica 07/02/2018  . OAB (overactive bladder) 04/02/2018  . CKD (chronic kidney disease), symptom management only, stage 2 (mild) 09/24/2017  . Encounter for Medicare annual wellness exam 06/02/2015  . Glaucoma 06/02/2015  . Vitamin D deficiency 10/21/2014  . Medication management 12/01/2013  . Hyperlipidemia, mixed   . Essential hypertension   . Abnormal glucose   . Migraines   . Allergy   . Anxiety   . Depression, major, recurrent ([place])   . Lumbar stenosis    Past Medical History:  Diagnosis Date  . Allergy   . Anemia   . Anxiety   . Depression   . Hyperlipidemia   . Hypertension   . Lumbar stenosis   . Migraines   . Prediabetes     Family History  Problem Relation Age of Onset  . Stroke Mother   . Hypertension Mother   . Heart attack Father 84  . Other Maternal Grandmother        blood clot after surgery  . Heart attack  Paternal Grandmother     Past Surgical History:  Procedure Laterality Date  . BREAST LUMPECTOMY Bilateral   . CHOLECYSTECTOMY  1999  . SPINE SURGERY     Social History   Occupational History  . Occupation: Optometrist  Tobacco Use  . Smoking status: Never Smoker  . Smokeless tobacco: Never Used  Vaping Use  . Vaping Use: Never used  Substance and Sexual Activity  . Alcohol use: Yes    Alcohol/week: 2.0 standard drinks    Types: 2 Glasses of wine per week  . Drug use: No  . Sexual activity: Not Currently

## 2020-10-27 ENCOUNTER — Telehealth: Payer: Self-pay | Admitting: Physical Medicine and Rehabilitation

## 2020-10-27 NOTE — Telephone Encounter (Signed)
Pt called to schedule an appt with Dr.Newton. She states she missed a call from  [place] [phone]

## 2020-10-27 NOTE — Telephone Encounter (Signed)
Called pt and sch 3/31

## 2020-10-29 ENCOUNTER — Other Ambulatory Visit: Payer: Self-pay | Admitting: Specialist

## 2020-10-29 DIAGNOSIS — G8929 Other chronic pain: Secondary | ICD-10-CM

## 2020-10-30 ENCOUNTER — Other Ambulatory Visit: Payer: Self-pay

## 2020-10-30 ENCOUNTER — Ambulatory Visit
Admission: RE | Admit: 2020-10-30 | Discharge: 2020-10-30 | Disposition: A | Discharge status: Home / Self Care | Payer: Medicare Other | Source: Ambulatory Visit | Attending: Specialist | Admitting: Specialist

## 2020-10-30 DIAGNOSIS — M545 Low back pain, unspecified: Secondary | ICD-10-CM | POA: Diagnosis not present

## 2020-10-30 DIAGNOSIS — G8929 Other chronic pain: Secondary | ICD-10-CM

## 2020-10-30 MED ORDER — METHYLPREDNISOLONE ACETATE 40 MG/ML INJ SUSP (RADIOLOG
120.0000 mg | Freq: Once | INTRAMUSCULAR | Status: AC
  Administered 2020-10-30: 120 mg via INTRA_ARTICULAR

## 2020-10-30 MED ORDER — IOPAMIDOL (ISOVUE-M 200) INJECTION 41%
1.0000 mL | Freq: Once | INTRAMUSCULAR | Status: AC
  Administered 2020-10-30: 1 mL via INTRA_ARTICULAR

## 2020-10-30 NOTE — Discharge Instructions (Signed)

## 2020-11-11 ENCOUNTER — Ambulatory Visit: Payer: Medicare Other | Admitting: Adult Health

## 2020-11-12 ENCOUNTER — Ambulatory Visit: Payer: Medicare Other | Admitting: Physical Medicine and Rehabilitation

## 2020-11-12 ENCOUNTER — Ambulatory Visit (INDEPENDENT_AMBULATORY_CARE_PROVIDER_SITE_OTHER): Payer: Medicare Other | Admitting: Adult Health

## 2020-11-12 ENCOUNTER — Other Ambulatory Visit: Payer: Self-pay

## 2020-11-12 ENCOUNTER — Encounter: Payer: Self-pay | Admitting: Adult Health

## 2020-11-12 VITALS — BP 138/72 | HR 77 | Temp 96.8°F | Wt 154.0 lb

## 2020-11-12 DIAGNOSIS — I10 Essential (primary) hypertension: Secondary | ICD-10-CM

## 2020-11-12 DIAGNOSIS — Z0001 Encounter for general adult medical examination with abnormal findings: Secondary | ICD-10-CM | POA: Diagnosis not present

## 2020-11-12 DIAGNOSIS — R7309 Other abnormal glucose: Secondary | ICD-10-CM | POA: Diagnosis not present

## 2020-11-12 DIAGNOSIS — E782 Mixed hyperlipidemia: Secondary | ICD-10-CM

## 2020-11-12 DIAGNOSIS — N3281 Overactive bladder: Secondary | ICD-10-CM

## 2020-11-12 DIAGNOSIS — I872 Venous insufficiency (chronic) (peripheral): Secondary | ICD-10-CM | POA: Diagnosis not present

## 2020-11-12 DIAGNOSIS — Z1211 Encounter for screening for malignant neoplasm of colon: Secondary | ICD-10-CM

## 2020-11-12 DIAGNOSIS — F331 Major depressive disorder, recurrent, moderate: Secondary | ICD-10-CM

## 2020-11-12 DIAGNOSIS — F419 Anxiety disorder, unspecified: Secondary | ICD-10-CM

## 2020-11-12 DIAGNOSIS — G8929 Other chronic pain: Secondary | ICD-10-CM

## 2020-11-12 DIAGNOSIS — H409 Unspecified glaucoma: Secondary | ICD-10-CM

## 2020-11-12 DIAGNOSIS — G43009 Migraine without aura, not intractable, without status migrainosus: Secondary | ICD-10-CM | POA: Diagnosis not present

## 2020-11-12 DIAGNOSIS — M48062 Spinal stenosis, lumbar region with neurogenic claudication: Secondary | ICD-10-CM

## 2020-11-12 DIAGNOSIS — E559 Vitamin D deficiency, unspecified: Secondary | ICD-10-CM

## 2020-11-12 DIAGNOSIS — R6889 Other general symptoms and signs: Secondary | ICD-10-CM | POA: Diagnosis not present

## 2020-11-12 DIAGNOSIS — T7840XS Allergy, unspecified, sequela: Secondary | ICD-10-CM

## 2020-11-12 DIAGNOSIS — M5442 Lumbago with sciatica, left side: Secondary | ICD-10-CM | POA: Diagnosis not present

## 2020-11-12 DIAGNOSIS — N182 Chronic kidney disease, stage 2 (mild): Secondary | ICD-10-CM | POA: Diagnosis not present

## 2020-11-12 DIAGNOSIS — Z0000 Encounter for general adult medical examination without abnormal findings: Secondary | ICD-10-CM

## 2020-11-12 DIAGNOSIS — Z79899 Other long term (current) drug therapy: Secondary | ICD-10-CM

## 2020-11-12 DIAGNOSIS — Z981 Arthrodesis status: Secondary | ICD-10-CM | POA: Insufficient documentation

## 2020-11-12 MED ORDER — DULOXETINE HCL 30 MG PO CPEP: ORAL_CAPSULE | ORAL | 1 refills | Status: DC

## 2020-11-12 NOTE — Progress Notes (Signed)
MEDICARE ANNUAL WELLNESS VISIT AND FOLLOW UP  Assessment:   Diagnoses and all orders for this visit:  Encounter for Medicare annual wellness exam Patient will ask GYN about ordering follow up DEXA scan  Essential hypertension At goal; continue meds Monitor blood pressure at home; call if consistently over 130/80 Continue DASH diet.   Reminder to go to the ER if any CP, SOB, nausea, dizziness, severe HA, changes vision/speech, left arm numbness and tingling and jaw pain.  Migraine without aura and without status migrainosus, not intractable Improved from previous - has mild HA once a week but does not progress to full migraines; continue medications Follow up neuro - [facility]  Mixed hyperlipidemia At goal; continue medications Continue low cholesterol diet and exercise.  Check lipid panel.  -     Lipid panel -     TSH  Other abnormal glucose Last A1Cs at goal  Discussed disease and risks Discussed diet/exercise, weight management  Check A1C  Allergic state, sequela Continue allergy medications as needed; rotate agent q 6 months, hygiene discussed  Depression, major, recurrent, active ([place]) with anxiety No improvement with celexa 90, resume celexa to 60 mg; xanax PRN Encouraged counseling/CBT - insurance issue -Dr. Hilliard Clark informaion given  She declines med adjustment at this time  Lifestyle discussed: diet/exerise, sleep hygiene, stress management, hydration  Spinal stenosis of lumbar region Continue follow up with ortho/neurosurgery   Medication management -     CBC with Differential/Platelet -     CMP/GFR  Vitamin D deficiency continue to recommend supplementation for goal of 70-100 Check vitamin D level  Glaucoma, unspecified glaucoma type, unspecified laterality Very mild - currently under observation Continue follow up with ophthalmology  CKD (chronic kidney disease), symptom management only, stage 2 (mild) Increase fluids, avoid NSAIDS, monitor  sugars, will monitor -     CMP WITH GFR  OAB Currently off of medications and doing well; monitor    Over 40 minutes of exam, counseling, chart review and critical decision making was performed Future Appointments  Date Time Provider [place]  11/19/2020  9:15 AM Jessy Oto, MD [facility] None  02/22/2021  9:30 AM Unk Pinto, MD [facility] None  06/08/2021  2:00 PM Unk Pinto, MD [facility] None  11/15/2021  2:30 PM Liane Comber, NP [facility] None     Plan:   During the course of the visit the patient was educated and counseled about appropriate screening and preventive services including:    Pneumococcal vaccine   Prevnar 13  Influenza vaccine  Td vaccine  Screening electrocardiogram  Bone densitometry screening  Colorectal cancer screening  Diabetes screening  Glaucoma screening  Nutrition counseling   Advanced directives: requested   Subjective:  Rebekah Hamilton is a 75 y.o. female who presents for Medicare Annual Wellness Visit and 3 month follow up.   She is scheduled for posterior lumbar interbody fusion multi level T9-S2 on 12/23/2019 with Dr. Jenne Campus at [facility], having more recurrent pain, has seen Dr. Louanne Skye recently with concern for hardware shifting, also R leg shorter, has follow up with surgeon at [facility] in May 2022. Was prescribed norco but using rarely, manages with gabapentin and also on cymbalta.   She has a diagnosis of recurrent major depression/anxiety related to her divorce which is dragging out now for several years, then husband passed away unexpectedly, having court/legal trouble with stepson. Currently treated by cymbalta 90 mg daily, PRN rare xanax. Was doing counseling but with medicare insurance had to  stop. Elderly mother is living with her.   Followed by [facility] annually for estrogen/progesterone supplementation.   BMI is Body mass index is 27.28 kg/m., she has not been working on diet and  exercise. Wt Readings from Last 3 Encounters:  11/12/20 154 lb (69.9 kg)  10/26/20 153 lb (69.4 kg)  10/07/20 153 lb (69.4 kg)    Her blood pressure has been controlled at home, today their BP is BP: 138/72 She does not workout. She denies chest pain, shortness of breath, dizziness.   She is on cholesterol medication (pravastatin 40 mg daily) and denies myalgias. Her cholesterol is at goal. The cholesterol last visit was:   Lab Results  Component Value Date   CHOL 137 06/04/2020   HDL 52 06/04/2020   LDLCALC 65 06/04/2020   TRIG 113 06/04/2020   CHOLHDL 2.6 06/04/2020    She has been working on diet and exercise for glucose management, and denies increased appetite, nausea, paresthesia of the feet, polydipsia, polyuria, visual disturbances and vomiting. Last A1C in the office was:  Lab Results  Component Value Date   HGBA1C 5.9 (H) 06/04/2020   Last GFR demonstrates labile stage 2/3 CKD, pushes water: Lab Results  Component Value Date   GFRNONAA 49 (L) 06/04/2020   GFRNONAA 67 02/12/2020   GFRNONAA 54 (L) 12/12/2019   Patient is on Vitamin D supplement and at goal of 60, taking about 10000-12000 IU daily:    Lab Results  Component Value Date   VD25OH 72 06/04/2020      Medication Review: Current Outpatient Medications on File Prior to Visit  Medication Sig Dispense Refill  . ALPRAZolam (XANAX) 0.25 MG tablet Take 1/2 to 1 tablet 2 to 3 x / day ONLY if needed for Acute Anxiety Attacks  & please try to limit to 5 days / week to avoid addiction 90 tablet 0  . Ascorbic Acid (VITAMIN C PO) 500 mg 2 (two) times daily.    . Cholecalciferol (VITAMIN D PO) Take by mouth. Takes 10000 to 12000 units daily.    . diclofenac sodium (VOLTAREN) 1 % GEL Apply 2 g topically 4 (four) times daily. 350 g 3  . DULoxetine (CYMBALTA) 30 MG capsule Take  3 capsules (90 mg)  Daily  for Mood 270 capsule 0  . Elastic Bandages & Supports (MEDICAL COMPRESSION STOCKINGS) MISC Knee high compression socks  20-30 pressure, measure and fit 2 each 0  . estradiol (ESTRACE) 0.5 MG tablet Take  1 tablet  Daily  for Estrogen Deficiency 90 tablet 0  . fexofenadine (ALLEGRA) 180 MG tablet Take 180 mg by mouth daily.    Marland Kitchen gabapentin (NEURONTIN) 300 MG capsule Take 1 capsule (300 mg total) by mouth 3 (three) times daily. 90 capsule 2  . hydrochlorothiazide (HYDRODIURIL) 25 MG tablet Take 1/2 -1 tablet daily for BP and fluid for goal <130/80. 90 tablet 1  . HYDROcodone-acetaminophen (NORCO/VICODIN) 5-325 MG tablet Take 1 tablet by mouth every 6 (six) hours as needed for moderate pain. 30 tablet 0  . metoprolol tartrate (LOPRESSOR) 25 MG tablet Take 0.5 tablets (12.5 mg total) by mouth 2 (two) times daily. 90 tablet 3  . pravastatin (PRAVACHOL) 40 MG tablet Take 1 tablet at Bedtime for Cholesterol 90 tablet 3  . progesterone (PROMETRIUM) 100 MG capsule Take  1 capsule  Daily 90 capsule 0  . rizatriptan (MAXALT) 10 MG tablet Take  1 tablet  for Migraine & may repeat 1 x in 2 hrs -[  Maximum 2 tablets  /24 hours ] 12 tablet 3   No current facility-administered medications on file prior to visit.    Allergies  Allergen Reactions  . Augmentin [Amoxicillin-Pot Clavulanate] Diarrhea  . Doxycycline Nausea Only  . Egg White (Diagnostic) Nausea Only  . Prozac [Fluoxetine Hcl] Other (See Comments)    Disoriented    Current Problems (verified) Patient Active Problem List   Diagnosis Date Noted  . S/P lumbar fusion 11/12/2020  . Chronic venous insufficiency 05/07/2019  . History of 2019 novel coronavirus disease (COVID-19) 05/07/2019  . Chronic left-sided low back pain with left-sided sciatica 07/02/2018  . OAB (overactive bladder) 04/02/2018  . CKD (chronic kidney disease), symptom management only, stage 2 (mild) 09/24/2017  . Encounter for Medicare annual wellness exam 06/02/2015  . Glaucoma 06/02/2015  . Vitamin D deficiency 10/21/2014  . Medication management 12/01/2013  . Hyperlipidemia, mixed   .  Essential hypertension   . Abnormal glucose   . Migraines   . Allergy   . Anxiety   . Depression, major, recurrent ([place])   . Lumbar stenosis     Screening Tests Immunization History  Administered Date(s) Administered  . Influenza, High Dose Seasonal PF 05/29/2014, 06/02/2015, 06/16/2017, 05/09/2018, 05/09/2019  . Influenza,inj,quad, With Preservative 08/12/2013, 07/04/2016  . Influenza-Unspecified 05/25/2020  . PFIZER(Purple Top)SARS-COV-2 Vaccination 09/30/2019, 10/21/2019, 06/22/2020  . Pneumococcal Conjugate-13 04/07/2015  . Pneumococcal Polysaccharide-23 05/29/2013  . Td 08/15/2005, 07/04/2016    Prior vaccinations: TD or Tdap: 2017  Influenza: 05/2020 Pneumococcal: 2014 Prevnar13: 2016 Shingles/Zostavax: 2016 Covid 19: 3/3, 2021  Preventative care: Last colonoscopy: 2006 Cologuard: 11/2017 - due, ordered  Last mammogram: 10/2019, via OBGYN, will schedule with GYN Last pap smear/pelvic exam: 2017 negative  DEXA: 2000, encouraged follow up, will schedule with GYN   MRI lumbar 01/2017, 08/2019  Names of Other Physician/Practitioners you currently use: 1. [facility] here for primary care 2. Dr. Herbert Deaner, eye doctor, last visit 09/2020, planning L catarct surgery April 2022 3. Dr. Evelene Croon, dentist, last visit 2022  Patient Care Team: Unk Pinto, MD as PCP - General (Internal Medicine) Garald Balding, MD as Consulting Physician (Orthopedic Surgery) Laurence Spates, MD (Inactive) as Consulting Physician (Gastroenterology) Avon Gully, NP as Nurse Practitioner (Obstetrics and Gynecology)  SURGICAL HISTORY She  has a past surgical history that includes Cholecystectomy (1999); Breast lumpectomy (Bilateral); and Spine surgery. FAMILY HISTORY Her family history includes Heart attack in her paternal grandmother; Heart attack (age of onset: 12) in her father; Hypertension in her mother; Other in her maternal grandmother; Stroke  in her mother. SOCIAL HISTORY She  reports that she has never smoked. She has never used smokeless tobacco. She reports current alcohol use of about 2.0 standard drinks of alcohol per week. She reports that she does not use drugs.   MEDICARE WELLNESS OBJECTIVES: Physical activity: Current Exercise Habits: The patient does not participate in regular exercise at present, Exercise limited by: neurologic condition(s);orthopedic condition(s) Cardiac risk factors: Cardiac Risk Factors include: advanced age (>56men, >32 women);dyslipidemia;hypertension;sedentary lifestyle Depression/mood screen:   Depression screen [facility] 2/9 11/12/2020  Decreased Interest 2  Down, Depressed, Hopeless 2  PHQ - 2 Score 4  Altered sleeping 1  Tired, decreased energy 2  Change in appetite 0  Feeling bad or failure about yourself  1  Trouble concentrating 1  Moving slowly or fidgety/restless 0  Suicidal thoughts 0  PHQ-9 Score 9  Difficult doing work/chores Somewhat difficult  Some recent data might be hidden  ADLs:  In your present state of health, do you have any difficulty performing the following activities: 11/12/2020 06/03/2020  Hearing? N N  Vision? Y N  Comment L cataract, has surgery planned -  Difficulty concentrating or making decisions? N N  Walking or climbing stairs? N N  Comment moves slowly -  Dressing or bathing? N N  Doing errands, shopping? N N  Some recent data might be hidden     Cognitive Testing  Alert? Yes  Normal Appearance?Yes  Oriented to person? Yes  Place? Yes   Time? Yes  Recall of three objects?  Yes  Can perform simple calculations? Yes  Displays appropriate judgment?Yes  Can read the correct time from a watch face?Yes  EOL planning: Does Patient Have a Medical Advance Directive?: Yes Type of Advance Directive: [place] will Does patient want to make changes to medical advance directive?: No - Patient declined Copy of [place] in Chart?: No - copy requested  Review of Systems  Constitutional: Negative for malaise/fatigue and weight loss.  HENT: Negative for hearing loss and tinnitus.   Eyes: Negative for blurred vision and double vision.  Respiratory: Negative for cough, shortness of breath and wheezing.   Cardiovascular: Negative for chest pain, palpitations, orthopnea, claudication and leg swelling.  Gastrointestinal: Negative for abdominal pain, blood in stool, constipation, diarrhea, heartburn, melena, nausea and vomiting.  Genitourinary: Negative.   Musculoskeletal: Positive for back pain (chronic lumbar). Negative for falls, joint pain and myalgias.  Skin: Negative for rash.  Neurological: Negative for dizziness, tingling, sensory change, weakness and headaches.  Endo/Heme/Allergies: Negative for polydipsia.  Psychiatric/Behavioral: Positive for depression. Negative for memory loss, substance abuse and suicidal ideas. The patient is not nervous/anxious and does not have insomnia.   All other systems reviewed and are negative.    Objective:     Today's Vitals   11/12/20 1435  BP: 138/72  Pulse: 77  Temp: (!) 96.8 F (36 C)  SpO2: 97%  Weight: 154 lb (69.9 kg)   Body mass index is 27.28 kg/m.  General appearance: alert, no distress, WD/WN, female HEENT: normocephalic, sclerae anicteric, BilTM pearly, nares patent, no discharge or erythema, pharynx normal Oral cavity: MMM, no lesions Neck: supple, no lymphadenopathy, no thyromegaly, no masses Heart: RRR, normal S1, S2, no murmurs Lungs: CTA bilaterally, no wheezes, rhonchi, or rales Abdomen: +bs, soft, non tender, non distended, no masses, no hepatomegaly, no splenomegaly Musculoskeletal: nontender, no swelling, obvious scoliosis of spine. Well healed midline incision.  Extremities: no edema, no cyanosis, no clubbing Pulses: 2+ symmetric, upper and lower extremities, normal cap refill Neurological: alert, oriented x 3, CN2-12 intact,  strength normal upper extremities and lower extremities, decreased sensation LLE, no cerebellar signs, gait slow steady Psychiatric: normal affect, behavior normal, pleasant   Medicare Attestation I have personally reviewed: The patient's medical and social history Their use of alcohol, tobacco or illicit drugs Their current medications and supplements The patient's functional ability including ADLs,fall risks, home safety risks, cognitive, and hearing and visual impairment Diet and physical activities Evidence for depression or mood disorders  The patient's weight, height, BMI, and visual acuity have been recorded in the chart.  I have made referrals, counseling, and provided education to the patient based on review of the above and I have provided the patient with a written personalized care plan for preventive services.     Izora Ribas, NP   11/12/2020

## 2020-11-12 NOTE — Patient Instructions (Signed)
  Ms. Rebekah Hamilton , Thank you for taking time to come for your Medicare Wellness Visit. I appreciate your ongoing commitment to your health goals. Please review the following plan we discussed and let me know if I can assist you in the future.   These are the goals we discussed: Goals    . Blood Pressure < 140/90    . Exercise 150 min/wk Moderate Activity       This is a list of the screening recommended for you and due dates:  Health Maintenance  Topic Date Due  . DEXA scan (bone density measurement)  02/13/2012  . Cologuard (Stool DNA test)  10/26/2020  . Mammogram  10/27/2020  . Tetanus Vaccine  07/04/2026  . Flu Shot  Completed  . COVID-19 Vaccine  Completed  .  Hepatitis C: One time screening is recommended by Center for Disease Control  (CDC) for  adults born from [birth year] through 1964.   Completed  . Pneumonia vaccines  Completed  . HPV Vaccine  Aged Out

## 2020-11-13 LAB — LIPID PANEL
Cholesterol: 163 mg/dL (ref <200)
HDL: 56 mg/dL (ref >=50)
LDL Cholesterol (Calc): 84 mg/dL (calc)
Non-HDL Cholesterol (Calc): 107 mg/dL (calc) (ref <130)
Total CHOL/HDL Ratio: 2.9 (calc) (ref <5.0)
Triglycerides: 126 mg/dL (ref <150)

## 2020-11-13 LAB — CBC WITH DIFFERENTIAL/PLATELET
Absolute Monocytes: 842 cells/uL (ref 200–950)
Basophils Absolute: 49 cells/uL (ref 0–200)
Basophils Relative: 0.4 %
Eosinophils Absolute: 73 cells/uL (ref 15–500)
Eosinophils Relative: 0.6 %
HCT: 45.4 % — ABNORMAL HIGH (ref 35.0–45.0)
Hemoglobin: 14.9 g/dL (ref 11.7–15.5)
Lymphs Abs: 2843 cells/uL (ref 850–3900)
MCH: 30 pg (ref 27.0–33.0)
MCHC: 32.8 g/dL (ref 32.0–36.0)
MCV: 91.3 fL (ref 80.0–100.0)
MPV: 9.8 fL (ref 7.5–12.5)
Monocytes Relative: 6.9 %
Neutro Abs: 8394 cells/uL — ABNORMAL HIGH (ref 1500–7800)
Neutrophils Relative %: 68.8 %
Platelets: 366 10*3/uL (ref 140–400)
RBC: 4.97 10*6/uL (ref 3.80–5.10)
RDW: 12.8 % (ref 11.0–15.0)
Total Lymphocyte: 23.3 %
WBC: 12.2 10*3/uL — ABNORMAL HIGH (ref 3.8–10.8)

## 2020-11-13 LAB — COMPLETE METABOLIC PANEL WITH GFR
AG Ratio: 1.8 (calc) (ref 1.0–2.5)
ALT: 21 U/L (ref 6–29)
AST: 19 U/L (ref 10–35)
Albumin: 4.4 g/dL (ref 3.6–5.1)
Alkaline phosphatase (APISO): 85 U/L (ref 37–153)
BUN/Creatinine Ratio: 23 (calc) — ABNORMAL HIGH (ref 6–22)
BUN: 24 mg/dL (ref 7–25)
CO2: 29 mmol/L (ref 20–32)
Calcium: 9.7 mg/dL (ref 8.6–10.4)
Chloride: 105 mmol/L (ref 98–110)
Creat: 1.03 mg/dL — ABNORMAL HIGH (ref 0.60–0.93)
GFR, Est African American: 62 mL/min/{1.73_m2} (ref >=60)
GFR, Est Non African American: 54 mL/min/{1.73_m2} — ABNORMAL LOW (ref >=60)
Globulin: 2.4 g/dL (calc) (ref 1.9–3.7)
Glucose, Bld: 118 mg/dL — ABNORMAL HIGH (ref 65–99)
Potassium: 4.5 mmol/L (ref 3.5–5.3)
Sodium: 144 mmol/L (ref 135–146)
Total Bilirubin: 0.4 mg/dL (ref 0.2–1.2)
Total Protein: 6.8 g/dL (ref 6.1–8.1)

## 2020-11-13 LAB — TSH: TSH: 2.23 mIU/L (ref 0.40–4.50)

## 2020-11-13 LAB — HEMOGLOBIN A1C
Hgb A1c MFr Bld: 5.7 % of total Hgb — ABNORMAL HIGH (ref <5.7)
Mean Plasma Glucose: 117 mg/dL
eAG (mmol/L): 6.5 mmol/L

## 2020-11-13 LAB — MAGNESIUM: Magnesium: 2.3 mg/dL (ref 1.5–2.5)

## 2020-11-13 LAB — VITAMIN D 25 HYDROXY (VIT D DEFICIENCY, FRACTURES): Vit D, 25-Hydroxy: 58 ng/mL (ref 30–100)

## 2020-11-18 DIAGNOSIS — H25013 Cortical age-related cataract, bilateral: Secondary | ICD-10-CM | POA: Diagnosis not present

## 2020-11-18 DIAGNOSIS — H18893 Other specified disorders of cornea, bilateral: Secondary | ICD-10-CM | POA: Diagnosis not present

## 2020-11-18 DIAGNOSIS — H35013 Changes in retinal vascular appearance, bilateral: Secondary | ICD-10-CM | POA: Diagnosis not present

## 2020-11-18 DIAGNOSIS — H2512 Age-related nuclear cataract, left eye: Secondary | ICD-10-CM | POA: Diagnosis not present

## 2020-11-18 DIAGNOSIS — H2513 Age-related nuclear cataract, bilateral: Secondary | ICD-10-CM | POA: Diagnosis not present

## 2020-11-19 ENCOUNTER — Encounter: Payer: Self-pay | Admitting: Specialist

## 2020-11-19 ENCOUNTER — Other Ambulatory Visit: Payer: Self-pay

## 2020-11-19 ENCOUNTER — Ambulatory Visit (INDEPENDENT_AMBULATORY_CARE_PROVIDER_SITE_OTHER): Payer: Medicare Other | Admitting: Specialist

## 2020-11-19 VITALS — BP 155/90 | HR 72 | Ht 63.0 in | Wt 154.0 lb

## 2020-11-19 DIAGNOSIS — M48061 Spinal stenosis, lumbar region without neurogenic claudication: Secondary | ICD-10-CM

## 2020-11-19 DIAGNOSIS — T8484XA Pain due to internal orthopedic prosthetic devices, implants and grafts, initial encounter: Secondary | ICD-10-CM

## 2020-11-19 DIAGNOSIS — M4325 Fusion of spine, thoracolumbar region: Secondary | ICD-10-CM

## 2020-11-19 DIAGNOSIS — M533 Sacrococcygeal disorders, not elsewhere classified: Secondary | ICD-10-CM

## 2020-11-19 NOTE — Patient Instructions (Signed)
Plan: Avoid frequent bending and stooping  No lifting greater than 10 lbs. May use ice or moist heat for pain. Weight loss is of benefit.If the pain recurrs then call us and we will have the [place] reinjected. You can have up to 3 injections every 6 months and at least 2 weeks apart. Exercise is important to improve your indurance and does allow people to function better inspite of back pain.

## 2020-11-19 NOTE — Progress Notes (Signed)
Office Visit Note   Patient: Rebekah Hamilton           Date of Birth: 02/08/1946           MRN: [id number] Visit Date: 11/19/2020              Requested by: Unk Pinto, [address] [place],  [place] [address] PCP: Unk Pinto, MD   Assessment & Plan: Right Sacroiliac Pain Below a long thoracolumbar fusion. Visit Diagnoses:  1. Sacroiliac pain   2. Painful orthopaedic hardware ([place])   3. Fusion of spine of thoracolumbar region   4. Spinal stenosis of lumbar region, unspecified whether neurogenic claudication present     Plan: Avoid frequent bending and stooping  No lifting greater than 10 lbs. May use ice or moist heat for pain. Weight loss is of benefit.If the pain recurrs then call us and we will have the [place] reinjected. You can have up to 3 injections every 6 months and at least 2 weeks apart. Exercise is important to improve your indurance and does allow people to function better inspite of back pain.      Follow-Up Instructions: No follow-ups on file.   Orders:  No orders of the defined types were placed in this encounter.  No orders of the defined types were placed in this encounter.     Procedures: No procedures performed   Clinical Data: No additional findings.   Subjective: Chief Complaint  Patient presents with  . Spine - Follow-up    CT Scan review of T & L Spines    HPI 75 year old female with history of long T-L fusion for painful scoliosis and spondylolisthesis with vertebrectomy. Had SI injection with good relief right side.   Review of Systems   Objective: Vital Signs: BP (!) 155/90 (BP Location: Left Arm, Patient Position: Sitting)   Pulse 72   Ht 5\' 3"  (1.6 m)   Wt 154 lb (69.9 kg)   BMI 27.28 kg/m   Physical Exam  Ortho Exam  Specialty Comments:  No specialty comments available.  Imaging: No results found.   PMFS History: Patient Active Problem List   Diagnosis Date Noted   . S/P lumbar fusion 11/12/2020  . Chronic venous insufficiency 05/07/2019  . History of 2019 novel coronavirus disease (COVID-19) 05/07/2019  . Chronic left-sided low back pain with left-sided sciatica 07/02/2018  . OAB (overactive bladder) 04/02/2018  . CKD (chronic kidney disease), symptom management only, stage 2 (mild) 09/24/2017  . Encounter for Medicare annual wellness exam 06/02/2015  . Glaucoma 06/02/2015  . Vitamin D deficiency 10/21/2014  . Medication management 12/01/2013  . Hyperlipidemia, mixed   . Essential hypertension   . Abnormal glucose   . Migraines   . Allergy   . Anxiety   . Depression, major, recurrent ([place])   . Lumbar stenosis    Past Medical History:  Diagnosis Date  . Allergy   . Anemia   . Anxiety   . Depression   . Hyperlipidemia   . Hypertension   . Lumbar stenosis   . Migraines   . Prediabetes     Family History  Problem Relation Age of Onset  . Stroke Mother   . Hypertension Mother   . Heart attack Father 52  . Other Maternal Grandmother        blood clot after surgery  . Heart attack Paternal Grandmother     Past Surgical History:  Procedure  Laterality Date  . BREAST LUMPECTOMY Bilateral   . CHOLECYSTECTOMY  1999  . SPINE SURGERY     Social History   Occupational History  . Occupation: Optometrist  Tobacco Use  . Smoking status: Never Smoker  . Smokeless tobacco: Never Used  Vaping Use  . Vaping Use: Never used  Substance and Sexual Activity  . Alcohol use: Yes    Alcohol/week: 2.0 standard drinks    Types: 2 Glasses of wine per week  . Drug use: No  . Sexual activity: Not Currently

## 2020-11-22 ENCOUNTER — Other Ambulatory Visit: Payer: Self-pay

## 2020-11-23 MED ORDER — PROGESTERONE MICRONIZED 100 MG PO CAPS: ORAL_CAPSULE | ORAL | 3 refills | Status: DC

## 2020-11-23 MED ORDER — ESTRADIOL 0.5 MG PO TABS: ORAL_TABLET | ORAL | 3 refills | Status: DC

## 2020-11-26 ENCOUNTER — Other Ambulatory Visit: Payer: Self-pay

## 2020-11-26 ENCOUNTER — Ambulatory Visit (INDEPENDENT_AMBULATORY_CARE_PROVIDER_SITE_OTHER): Payer: Medicare Other

## 2020-11-26 ENCOUNTER — Ambulatory Visit (INDEPENDENT_AMBULATORY_CARE_PROVIDER_SITE_OTHER): Payer: Medicare Other | Admitting: Specialist

## 2020-11-26 ENCOUNTER — Encounter: Payer: Self-pay | Admitting: Specialist

## 2020-11-26 VITALS — BP 146/83 | HR 64 | Ht 63.0 in | Wt 154.0 lb

## 2020-11-26 DIAGNOSIS — T8484XA Pain due to internal orthopedic prosthetic devices, implants and grafts, initial encounter: Secondary | ICD-10-CM | POA: Diagnosis not present

## 2020-11-26 DIAGNOSIS — M533 Sacrococcygeal disorders, not elsewhere classified: Secondary | ICD-10-CM | POA: Diagnosis not present

## 2020-11-26 DIAGNOSIS — M48061 Spinal stenosis, lumbar region without neurogenic claudication: Secondary | ICD-10-CM | POA: Diagnosis not present

## 2020-11-26 DIAGNOSIS — M25551 Pain in right hip: Secondary | ICD-10-CM

## 2020-11-26 DIAGNOSIS — M4325 Fusion of spine, thoracolumbar region: Secondary | ICD-10-CM

## 2020-11-26 MED ORDER — HYDROCODONE-ACETAMINOPHEN 5-325 MG PO TABS
1.0000 | ORAL_TABLET | Freq: Four times a day (QID) | ORAL | 0 refills | Status: DC | PRN
Start: 2020-11-26 — End: 2021-06-08

## 2020-11-26 NOTE — Progress Notes (Addendum)
Office Visit Note   Patient: Rebekah Hamilton           Date of Birth: 12-Apr-1946           MRN: [id number] Visit Date: 11/26/2020              Requested by: Unk Pinto, [address],  [place] [id number] PCP: Unk Pinto, MD   Assessment & Plan: Visit Diagnoses:  1. Spinal stenosis of lumbar region, unspecified whether neurogenic claudication present   2. Sacroiliac pain   3. Painful orthopaedic hardware ([place])   4. Fusion of spine of thoracolumbar region     Plan: Avoid bending, stooping and avoid lifting weights greater than 10 lbs. Avoid prolong standing and walking. Avoid frequent bending and stooping  No lifting greater than 10 lbs. May use ice or moist heat for pain. Weight loss is of benefit. Handicap license is approved. A repeat right SI joint injection is ordered and the Radiologists  will call to arrange for arranging the repeat right SI local anesthetic steroid injection  Hydrocodone for pain.  Obtain CT scan of the SI joints.   Follow-Up Instructions: Return in about 4 weeks (around 12/24/2020).   Orders:  Orders Placed This Encounter  Procedures  . XR Lumbar Spine 2-3 Views  . Ambulatory referral to Interventional Radiology   Meds ordered this encounter  Medications  . HYDROcodone-acetaminophen (NORCO/VICODIN) 5-325 MG tablet    Sig: Take 1 tablet by mouth every 6 (six) hours as needed for moderate pain.    Dispense:  30 tablet    Refill:  0      Procedures: No procedures performed   Clinical Data: No additional findings.   Subjective: Chief Complaint  Patient presents with  . Lower Back - Pain, Follow-up    75 year old female nearly one year post thoracolumbar fusion for a kyphoscoliosis of the thoracolumbar spine. She was seen earlier this year with right sided SI joint pain. Injection treatment of the SI joint was of benefit and relieved her pain until this past weekend.She relates she was bending  forward and felt a pop or catch in her back and right buttock. After a period of time the pain began to worsen into the right buttock. She has a long thoracolumbar fusion with bilateral iliac wing screws. Today we performed Xrays, motor is intact.    Review of Systems  Constitutional: Negative.   HENT: Negative.   Eyes: Negative.   Respiratory: Negative.   Cardiovascular: Negative.   Gastrointestinal: Negative.   Endocrine: Negative.   Genitourinary: Negative.   Musculoskeletal: Negative.   Skin: Negative.   Allergic/Immunologic: Negative.   Neurological: Negative.   Hematological: Negative.   Psychiatric/Behavioral: Negative.      Objective: Vital Signs: BP (!) 146/83   Pulse 64   Ht 5\' 3"  (1.6 m)   Wt 154 lb (69.9 kg)   BMI 27.28 kg/m   Physical Exam Constitutional:      Appearance: She is well-developed.  HENT:     Head: Normocephalic and atraumatic.  Eyes:     Pupils: Pupils are equal, round, and reactive to light.  Pulmonary:     Effort: Pulmonary effort is normal.     Breath sounds: Normal breath sounds.  Abdominal:     General: Bowel sounds are normal.     Palpations: Abdomen is soft.  Musculoskeletal:        General: Normal range of motion.  Cervical back: Normal range of motion and neck supple.     Lumbar back: Negative right straight leg raise test and negative left straight leg raise test.  Skin:    General: Skin is warm and dry.  Neurological:     Mental Status: She is alert and oriented to person, place, and time.  Psychiatric:        Behavior: Behavior normal.        Thought Content: Thought content normal.        Judgment: Judgment normal.     Back Exam   Tenderness  The patient is experiencing tenderness in the sacroiliac.  Range of Motion  Lateral bend right: normal  Lateral bend left: normal  Rotation right: normal  Rotation left: normal   Muscle Strength  Left Quadriceps:  5/5  Right Hamstrings:  5/5  Left Hamstrings:  5/5    Tests  Straight leg raise right: negative Straight leg raise left: negative  Reflexes  Patellar: 1/4 Achilles: 2/4  Other  Toe walk: normal Heel walk: normal Gait: normal   Comments:  Figure of 4 right hip with right buttock pain and right SI joint pain.      Specialty Comments:  No specialty comments available.  Imaging: XR Lumbar Spine 2-3 Views  Result Date: 11/26/2020 AP and lateral radiographs of the lumbar spine demonstrate a long thoracolumbar fusionwith Pedicle screws and rods from The thoracic spine to the sacrum there are SI pedicle screws and bilateral iliac wing screws. There is DJD right SI joint and periscrews lucency right SI pedicle screw and right medial iliac wind screw.     PMFS History: Patient Active Problem List   Diagnosis Date Noted  . S/P lumbar fusion 11/12/2020  . Chronic venous insufficiency 05/07/2019  . History of 2019 novel coronavirus disease (COVID-19) 05/07/2019  . Chronic left-sided low back pain with left-sided sciatica 07/02/2018  . OAB (overactive bladder) 04/02/2018  . CKD (chronic kidney disease), symptom management only, stage 2 (mild) 09/24/2017  . Encounter for Medicare annual wellness exam 06/02/2015  . Glaucoma 06/02/2015  . Vitamin D deficiency 10/21/2014  . Medication management 12/01/2013  . Hyperlipidemia, mixed   . Essential hypertension   . Abnormal glucose   . Migraines   . Allergy   . Anxiety   . Depression, major, recurrent ([place])   . Lumbar stenosis    Past Medical History:  Diagnosis Date  . Allergy   . Anemia   . Anxiety   . Depression   . Hyperlipidemia   . Hypertension   . Lumbar stenosis   . Migraines   . Prediabetes     Family History  Problem Relation Age of Onset  . Stroke Mother   . Hypertension Mother   . Heart attack Father 34  . Other Maternal Grandmother        blood clot after surgery  . Heart attack Paternal Grandmother     Past Surgical History:  Procedure Laterality Date   . BREAST LUMPECTOMY Bilateral   . CHOLECYSTECTOMY  1999  . SPINE SURGERY     Social History   Occupational History  . Occupation: Optometrist  Tobacco Use  . Smoking status: Never Smoker  . Smokeless tobacco: Never Used  Vaping Use  . Vaping Use: Never used  Substance and Sexual Activity  . Alcohol use: Yes    Alcohol/week: 2.0 standard drinks    Types: 2 Glasses of wine per week  . Drug use: No  .  Sexual activity: Not Currently

## 2020-11-26 NOTE — Patient Instructions (Signed)
Avoid bending, stooping and avoid lifting weights greater than 10 lbs. Avoid prolong standing and walking. Avoid frequent bending and stooping  No lifting greater than 10 lbs. May use ice or moist heat for pain. Weight loss is of benefit. Handicap license is approved. A repeat right SI joint injection is ordered and the Radiologists  will call to arrange for arranging the repeat right SI local anesthetic steroid injection  Hydrocodone for pain.  Obtain CT scan of the SI joints.

## 2020-12-03 DIAGNOSIS — R102 Pelvic and perineal pain: Secondary | ICD-10-CM | POA: Diagnosis not present

## 2020-12-05 ENCOUNTER — Ambulatory Visit
Admission: RE | Admit: 2020-12-05 | Discharge: 2020-12-05 | Disposition: A | Discharge status: Home / Self Care | Payer: Medicare Other | Source: Ambulatory Visit | Attending: Specialist | Admitting: Specialist

## 2020-12-05 ENCOUNTER — Inpatient Hospital Stay: Admission: RE | Admit: 2020-12-05 | Payer: Medicare Other | Source: Ambulatory Visit

## 2020-12-05 DIAGNOSIS — M533 Sacrococcygeal disorders, not elsewhere classified: Secondary | ICD-10-CM

## 2020-12-05 DIAGNOSIS — T8484XA Pain due to internal orthopedic prosthetic devices, implants and grafts, initial encounter: Secondary | ICD-10-CM

## 2020-12-05 DIAGNOSIS — M4325 Fusion of spine, thoracolumbar region: Secondary | ICD-10-CM

## 2020-12-05 DIAGNOSIS — M25551 Pain in right hip: Secondary | ICD-10-CM

## 2020-12-09 ENCOUNTER — Encounter: Payer: Self-pay | Admitting: Surgery

## 2020-12-09 ENCOUNTER — Ambulatory Visit: Payer: Self-pay

## 2020-12-09 ENCOUNTER — Ambulatory Visit (INDEPENDENT_AMBULATORY_CARE_PROVIDER_SITE_OTHER): Payer: Medicare Other | Admitting: Surgery

## 2020-12-09 VITALS — Ht 63.0 in | Wt 154.0 lb

## 2020-12-09 DIAGNOSIS — M545 Low back pain, unspecified: Secondary | ICD-10-CM | POA: Diagnosis not present

## 2020-12-09 DIAGNOSIS — Z981 Arthrodesis status: Secondary | ICD-10-CM

## 2020-12-09 DIAGNOSIS — M546 Pain in thoracic spine: Secondary | ICD-10-CM

## 2020-12-09 DIAGNOSIS — M7061 Trochanteric bursitis, right hip: Secondary | ICD-10-CM

## 2020-12-09 DIAGNOSIS — M7062 Trochanteric bursitis, left hip: Secondary | ICD-10-CM

## 2020-12-09 MED ORDER — METHYLPREDNISOLONE ACETATE 40 MG/ML IJ SUSP
40.0000 mg | INTRAMUSCULAR | Status: AC | PRN
  Administered 2020-12-09: 40 mg via INTRA_ARTICULAR

## 2020-12-09 MED ORDER — LIDOCAINE HCL 1 % IJ SOLN
3.0000 mL | INTRAMUSCULAR | Status: AC | PRN
  Administered 2020-12-09: 3 mL

## 2020-12-09 MED ORDER — BUPIVACAINE HCL 0.25 % IJ SOLN
6.0000 mL | INTRAMUSCULAR | Status: AC | PRN
  Administered 2020-12-09: 6 mL via INTRA_ARTICULAR

## 2020-12-09 NOTE — Progress Notes (Signed)
Office Visit Note   Patient: Rebekah Hamilton           Date of Birth: 01-29-1946           MRN: [id number] Visit Date: 12/09/2020              Requested by: Unk Pinto, [address],   [id number] PCP: Unk Pinto, MD   Assessment & Plan: Visit Diagnoses:  1. Pain in thoracic spine   2. Lumbar pain   3. Status post thoracic spinal fusion   4. S/P lumbar fusion   5. Greater trochanteric bursitis, left   6. Greater trochanteric bursitis, right   Left thoracolumbar strain  Plan:  Advised patient that she may have a thoracolumbar strain from awkward bending when she picked up the object about 10 days ago.  Recommend heat and some light gentle stretching and this was demonstrated.  In hopes of giving her some improvement of her left greater than right hip greater trochanteric bursitis offered injection on the left side.  After patient consent left lateral hip was prepped with Betadine and greater trochanter bursa Marcaine/Depo-Medrol 6:1 was performed.  After sitting for a few minutes patient had excellent relief of her lateral left hip pain with anesthetic in place.  Follow-up with Dr. Louanne Skye in 2 weeks for recheck to review CT pelvis that he ordered and also see how the left hip is doing.  If she continues have good relief of her left hip pain he can consider doing the right hip at that time.  All questions answered.  Also reviewed x-ray findings from today and advised patient that there are not any acute changes.  Hardware intact.  Follow-Up Instructions: Return in about 2 weeks (around 12/23/2020) for WITH DR NITKA PER TO REVIEW CT PELVIS (PER Danzell Birky).   Orders:  Orders Placed This Encounter  Procedures  . Large Joint Inj  . XR Thoracic Spine 2 View  . XR Lumbar Spine 2-3 Views   No orders of the defined types were placed in this encounter.     Procedures: Large Joint Inj: L greater trochanter on 12/09/2020 11:45 AM Indications:  pain Details: 22 G 3.5 in needle, lateral approach Medications: 3 mL lidocaine 1 %; 6 mL bupivacaine 0.25 %; 40 mg methylPREDNISolone acetate 40 MG/ML Outcome: tolerated well, no immediate complications Consent was given by the patient. Patient was prepped and draped in the usual sterile fashion.       Clinical Data: No additional findings.   Subjective: Chief Complaint  Patient presents with  . CT Scan Review -Pelvis    HPI 75 year old white female returns for review of her CT pelvis Dr. Louanne Skye ordered December 05, 2020.  There was a mixup upfront and patient was put on my schedule and not Dr. Otho Ket.  Report showed:  CLINICAL DATA:  Pelvic pain. Suspected stress fracture. Status post fall.  EXAM: CT PELVIS WITHOUT CONTRAST  TECHNIQUE: Multidetector CT imaging of the pelvis was performed following the standard protocol without intravenous contrast.  COMPARISON:  X-ray lumbar spine 11/26/2020  FINDINGS: Urinary Tract:  No abnormality visualized.  Bowel: Colonic diverticulosis. Otherwise unremarkable visualized pelvic bowel loops.  Vascular/Lymphatic: No pathologically enlarged lymph nodes. No significant vascular abnormality seen.  Reproductive:  No mass or other significant abnormality  Other: No intraperitoneal free fluid. No intraperitoneal free gas. No organized fluid collection.  Musculoskeletal: No acute displaced fracture. No pelvic diastasis. No hip dislocation. Lumbar and sacroiliac joint  surgical hardware partially visualized. No CT findings to suggest surgical hardware complication. Visualized lumbar spine demonstrates degenerative changes.  IMPRESSION: No acute displaced fracture, pelvic diastasis, or hip dislocation in a patient with partially visualized lumbar in sacroiliac joint surgical hardware. No CT findings to suggest surgical hardware complication.   Electronically Signed   By: Iven Finn M.D.   On: 12/06/2020  06:27  Patient states that she has been having ongoing left-sided thoracolumbar pain more so after picking something up off a stool about 10 days ago.  States that she bent over to the left and she felt a "crunch, pop" in the left side of her low back.  States that she also has some numbness and tingling in her right leg and this is switched over to the left side.  Complains of pain also in the left greater than right lateral hips.  Pain when she is ambulating and laying on both of her sides.   Objective: Vital Signs: Ht 5\' 3"  (1.6 m)   Wt 154 lb (69.9 kg)   BMI 27.28 kg/m   Physical Exam Eyes:     Extraocular Movements: Extraocular movements intact.  Pulmonary:     Effort: No respiratory distress.  Musculoskeletal:        General: Tenderness (Patient mild-moderate left-sided thoracolumbar tenderness/spasm.  Moderate to marked left greater than right hip greater trochanter bursa tenderness.  Tenderness also extends down the course of both IT bands.) present.     Comments: Negative logroll.  Negative straight leg raise.  Neurological:     General: No focal deficit present.     Mental Status: She is alert and oriented to person, place, and time.  Psychiatric:        Mood and Affect: Mood normal.     Ortho Exam  Specialty Comments:  No specialty comments available.  Imaging: No results found.   PMFS History: Patient Active Problem List   Diagnosis Date Noted  . S/P lumbar fusion 11/12/2020  . Chronic venous insufficiency 05/07/2019  . History of 2019 novel coronavirus disease (COVID-19) 05/07/2019  . Chronic left-sided low back pain with left-sided sciatica 07/02/2018  . OAB (overactive bladder) 04/02/2018  . CKD (chronic kidney disease), symptom management only, stage 2 (mild) 09/24/2017  . Encounter for Medicare annual wellness exam 06/02/2015  . Glaucoma 06/02/2015  . Vitamin D deficiency 10/21/2014  . Medication management 12/01/2013  . Hyperlipidemia, mixed   .  Essential hypertension   . Abnormal glucose   . Migraines   . Allergy   . Anxiety   . Depression, major, recurrent ([place])   . Lumbar stenosis    Past Medical History:  Diagnosis Date  . Allergy   . Anemia   . Anxiety   . Depression   . Hyperlipidemia   . Hypertension   . Lumbar stenosis   . Migraines   . Prediabetes     Family History  Problem Relation Age of Onset  . Stroke Mother   . Hypertension Mother   . Heart attack Father 53  . Other Maternal Grandmother        blood clot after surgery  . Heart attack Paternal Grandmother     Past Surgical History:  Procedure Laterality Date  . BREAST LUMPECTOMY Bilateral   . CHOLECYSTECTOMY  1999  . SPINE SURGERY     Social History   Occupational History  . Occupation: Optometrist  Tobacco Use  . Smoking status: Never Smoker  . Smokeless tobacco: Never Used  Vaping Use  . Vaping Use: Never used  Substance and Sexual Activity  . Alcohol use: Yes    Alcohol/week: 2.0 standard drinks    Types: 2 Glasses of wine per week  . Drug use: No  . Sexual activity: Not Currently

## 2020-12-16 DIAGNOSIS — T84296A Other mechanical complication of internal fixation device of vertebrae, initial encounter: Secondary | ICD-10-CM | POA: Diagnosis not present

## 2020-12-16 DIAGNOSIS — Z981 Arthrodesis status: Secondary | ICD-10-CM | POA: Diagnosis not present

## 2020-12-16 DIAGNOSIS — M47812 Spondylosis without myelopathy or radiculopathy, cervical region: Secondary | ICD-10-CM | POA: Diagnosis not present

## 2020-12-16 DIAGNOSIS — T84216A Breakdown (mechanical) of internal fixation device of vertebrae, initial encounter: Secondary | ICD-10-CM | POA: Diagnosis not present

## 2020-12-16 DIAGNOSIS — R937 Abnormal findings on diagnostic imaging of other parts of musculoskeletal system: Secondary | ICD-10-CM | POA: Diagnosis not present

## 2020-12-16 DIAGNOSIS — M48062 Spinal stenosis, lumbar region with neurogenic claudication: Secondary | ICD-10-CM | POA: Diagnosis not present

## 2020-12-21 ENCOUNTER — Other Ambulatory Visit: Payer: Self-pay | Admitting: Specialist

## 2020-12-21 DIAGNOSIS — M48061 Spinal stenosis, lumbar region without neurogenic claudication: Secondary | ICD-10-CM

## 2020-12-23 ENCOUNTER — Encounter: Payer: Self-pay | Admitting: Specialist

## 2020-12-23 ENCOUNTER — Ambulatory Visit (INDEPENDENT_AMBULATORY_CARE_PROVIDER_SITE_OTHER): Payer: Medicare Other | Admitting: Specialist

## 2020-12-23 VITALS — BP 160/89 | HR 63 | Ht 64.0 in | Wt 157.0 lb

## 2020-12-23 DIAGNOSIS — T84216A Breakdown (mechanical) of internal fixation device of vertebrae, initial encounter: Secondary | ICD-10-CM

## 2020-12-23 DIAGNOSIS — M48061 Spinal stenosis, lumbar region without neurogenic claudication: Secondary | ICD-10-CM

## 2020-12-23 DIAGNOSIS — M4316 Spondylolisthesis, lumbar region: Secondary | ICD-10-CM

## 2020-12-23 DIAGNOSIS — T8484XA Pain due to internal orthopedic prosthetic devices, implants and grafts, initial encounter: Secondary | ICD-10-CM

## 2020-12-23 DIAGNOSIS — M4325 Fusion of spine, thoracolumbar region: Secondary | ICD-10-CM

## 2020-12-23 DIAGNOSIS — M4156 Other secondary scoliosis, lumbar region: Secondary | ICD-10-CM | POA: Diagnosis not present

## 2020-12-23 NOTE — Patient Instructions (Signed)
Plan: Avoid bending, stooping and avoid lifting weights greater than 10 lbs. Avoid prolong standing and walking. Avoid frequent bending and stooping  No lifting greater than 10 lbs. May use ice or moist heat for pain. Weight loss is of benefit. Handicap license is approved. 

## 2020-12-23 NOTE — Progress Notes (Signed)
Office Visit Note   Patient: Rebekah Hamilton           Date of Birth: 07-28-1946           MRN: [id number] Visit Date: 12/23/2020              Requested by: Unk Pinto, [address],  [place] [address] PCP: Unk Pinto, MD   Assessment & Plan: Visit Diagnoses:  1. Painful orthopaedic hardware ([place])   2. 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 Fusion of spine of thoracolumbar region   4. Spinal stenosis of lumbar region, unspecified whether neurogenic claudication present   5. Spondylolisthesis of lumbar region   6. Other secondary scoliosis, lumbar region     Plan: Avoid bending, stooping and avoid lifting weights greater than 10 lbs. Avoid prolong standing and walking. Avoid frequent bending and stooping  No lifting greater than 10 lbs. May use ice or moist heat for pain. Weight loss is of benefit. Handicap license is approved.   Follow-Up Instructions: Return in about 4 weeks (around 01/20/2021).   Orders:  No orders of the defined types were placed in this encounter.  No orders of the defined types were placed in this encounter.     Procedures: No procedures performed   Clinical Data: No additional findings.   Subjective: Chief Complaint  Patient presents with  . Pelvis - Follow-up    Ct scan review of Pelvis    75 year old female with pain in the back and right buttock and she has had evaluation at [facility] and she is scheduled to undergo a revision of  Hardware and rearthrodesis. She is returning to [facility] in 01/2021.    Review of Systems  Constitutional: Negative.   HENT: Negative.   Eyes: Negative.   Respiratory: Negative.   Cardiovascular: Negative.   Gastrointestinal: Negative.   Endocrine: Negative.   Genitourinary: Negative.   Musculoskeletal: Negative.   Skin: Negative.   Allergic/Immunologic: Negative.   Neurological: Negative.   Hematological: Negative.    Psychiatric/Behavioral: Negative.      Objective: Vital Signs: BP (!) 160/89 (BP Location: Left Arm, Patient Position: Sitting)   Pulse 63   Ht 5\' 4"  (1.626 m)   Wt 157 lb (71.2 kg)   BMI 26.95 kg/m   Physical Exam  Ortho Exam  Specialty Comments:  No specialty comments available.  Imaging: No results found.   PMFS History: Patient Active Problem List   Diagnosis Date Noted  . S/P lumbar fusion 11/12/2020  . Chronic venous insufficiency 05/07/2019  . History of 2019 novel coronavirus disease (COVID-19) 05/07/2019  . Chronic left-sided low back pain with left-sided sciatica 07/02/2018  . OAB (overactive bladder) 04/02/2018  . CKD (chronic kidney disease), symptom management only, stage 2 (mild) 09/24/2017  . Encounter for Medicare annual wellness exam 06/02/2015  . Glaucoma 06/02/2015  . Vitamin D deficiency 10/21/2014  . Medication management 12/01/2013  . Hyperlipidemia, mixed   . Essential hypertension   . Abnormal glucose   . Migraines   . Allergy   . Anxiety   . Depression, major, recurrent ([place])   . Lumbar stenosis    Past Medical History:  Diagnosis Date  . Allergy   . Anemia   . Anxiety   . Depression   . Hyperlipidemia   . Hypertension   . Lumbar stenosis   . Migraines   . Prediabetes     Family History  Problem Relation Age of Onset  . Stroke Mother   . Hypertension Mother   . Heart attack Father 25  . Other Maternal Grandmother        blood clot after surgery  . Heart attack Paternal Grandmother     Past Surgical History:  Procedure Laterality Date  . BREAST LUMPECTOMY Bilateral   . CHOLECYSTECTOMY  1999  . SPINE SURGERY     Social History   Occupational History  . Occupation: Optometrist  Tobacco Use  . Smoking status: Never Smoker  . Smokeless tobacco: Never Used  Vaping Use  . Vaping Use: Never used  Substance and Sexual Activity  . Alcohol use: Yes    Alcohol/week: 2.0 standard drinks    Types: 2 Glasses of wine per  week  . Drug use: No  . Sexual activity: Not Currently

## 2021-01-29 DIAGNOSIS — I10 Essential (primary) hypertension: Secondary | ICD-10-CM | POA: Diagnosis present

## 2021-01-29 DIAGNOSIS — G43909 Migraine, unspecified, not intractable, without status migrainosus: Secondary | ICD-10-CM | POA: Diagnosis present

## 2021-01-29 DIAGNOSIS — M960 Pseudarthrosis after fusion or arthrodesis: Secondary | ICD-10-CM | POA: Diagnosis not present

## 2021-01-29 DIAGNOSIS — Z79899 Other long term (current) drug therapy: Secondary | ICD-10-CM | POA: Diagnosis not present

## 2021-01-29 DIAGNOSIS — T84498A Other mechanical complication of other internal orthopedic devices, implants and grafts, initial encounter: Secondary | ICD-10-CM | POA: Diagnosis not present

## 2021-01-29 DIAGNOSIS — Z981 Arthrodesis status: Secondary | ICD-10-CM | POA: Diagnosis not present

## 2021-01-29 DIAGNOSIS — E785 Hyperlipidemia, unspecified: Secondary | ICD-10-CM | POA: Diagnosis present

## 2021-01-29 DIAGNOSIS — R4189 Other symptoms and signs involving cognitive functions and awareness: Secondary | ICD-10-CM | POA: Diagnosis present

## 2021-01-29 DIAGNOSIS — F419 Anxiety disorder, unspecified: Secondary | ICD-10-CM | POA: Diagnosis present

## 2021-01-29 DIAGNOSIS — F32A Depression, unspecified: Secondary | ICD-10-CM | POA: Diagnosis present

## 2021-01-29 DIAGNOSIS — T84226A Displacement of internal fixation device of vertebrae, initial encounter: Secondary | ICD-10-CM | POA: Diagnosis not present

## 2021-02-06 DIAGNOSIS — Z23 Encounter for immunization: Secondary | ICD-10-CM | POA: Diagnosis not present

## 2021-02-09 ENCOUNTER — Telehealth: Payer: Self-pay | Admitting: Specialist

## 2021-02-09 NOTE — Telephone Encounter (Signed)
Pt called to see if she could speak with Dr. Otho Ket nurse. Pt had surgery on 01/29/21 at [facility]. as she was referred there by Dr. Louanne Skye. Pt was calling to ask if one of Dr. Otho Ket staff members here could remove the staples from her surgery or will she have to return to [facility] and get them removed. Pt did not want to drive back to them if it was possible to get it them removed here. The best call back number is [phone]

## 2021-02-09 NOTE — Telephone Encounter (Signed)
I advised that she should go back to be seen at the surgeons office as they are the ones that need to be taking care of the incision as the treating surgeon.

## 2021-02-19 DIAGNOSIS — Z4802 Encounter for removal of sutures: Secondary | ICD-10-CM | POA: Diagnosis not present

## 2021-02-21 ENCOUNTER — Encounter: Payer: Self-pay | Admitting: Internal Medicine

## 2021-02-21 NOTE — Progress Notes (Signed)
Future Appointments  Date Time Provider [place]  02/22/2021  9:30 AM Unk Pinto, MD [facility] None  06/08/2021  - CPE   2:00 PM Unk Pinto, MD [facility] None  11/15/2021  -     Wellness  2:30 PM Liane Comber, NP [facility] None    History of Present Illness:       This very nice 75 y.o. DWF presents for 9 month follow up with HTN, HLD, Pre-Diabetes and Vitamin D Deficiency. In May, 2021, patient underwent  extensive T9- S2  spine surgery by Dr. Jenne Campus at [facility]. She continues to c/o some weakness in her Rt leg.                                                Patient is followed expectantly for labile HTN  (2018) & BP has been controlled  on HCTZ . Today's BP is at goal - 118/78. Patient has had no complaints of any cardiac type chest pain, palpitations, dyspnea / orthopnea / PND, dizziness, claudication, or dependent edema.       Hyperlipidemia is controlled with diet & Pravastatin. Patient denies myalgias or other med SE's. Last Lipids were at goal:  Lab Results  Component Value Date   CHOL 163 11/12/2020   HDL 56 11/12/2020   LDLCALC 84 11/12/2020   TRIG 126 11/12/2020   CHOLHDL 2.9 11/12/2020    Also, the patient has history of PreDiabetes (A1c 5.8% /2015) and has had no symptoms of reactive hypoglycemia, diabetic polys, paresthesias or visual blurring.  Last A1c was  near goal:  Lab Results  Component Value Date   HGBA1C 5.7 (H) 11/12/2020                                                    Further, the patient also has history of Vitamin D Deficiency ("36" /2016) and supplements vitamin D without any suspected side-effects. Last vitamin D was at goal:  Lab Results  Component Value Date   VD25OH 58 11/12/2020     Current Outpatient Medications on File Prior to Visit  Medication Sig   ALPRAZolam  0.25 MG tablet Take 1/2 to 1 tablet 2 to 3 x / day ONLY if needed    VITAMIN C 500 mg  2 (two) times daily.   VITAMIN D Takes 10000 to  12000 units daily.   diclofenac  1 % GEL Apply 2 g topically 4  times daily.   DULoxetine  30 MG capsule Take 2 capsules   Daily  for Mood   estradiol 0.5 MG tablet Take  1 tablet  Daily  for Estrogen Deficiency   fexofenadine 180 MG tablet Take daily.   Gabapentin  300 MG capsule Take 1 capsule  3 times daily.   hydrochlorothiazide  25 MG tablet Take 1/2 -1 tablet daily for BP and fluid for goal <130/80.   HYDROcodone-APAP   5-325 MG  Take 1 tablet  every 6 (six) hours as needed for moderate pain.   metoprolol tartrate 25 MG tablet Take 0.5 tablets 2 times daily.   pravastatin  40 MG tablet Take 1 tablet at Bedtime    progesterone  100 MG capsule Take  1 capsule  Daily   rizatriptan (MAXALT) 10 MG tablet Take  1 tablet  for Migraine & may repeat 1 x in 2 hrs -[ Maximum 2 tablets  /24 hours ]    Allergies  Allergen Reactions   Augmentin [Amoxicillin-Pot Clavulanate] Diarrhea   Doxycycline Nausea Only   Egg White (Diagnostic) Nausea Only   Prozac [Fluoxetine Hcl] Other (See Comments)    Disoriented    PMHx:   Past Medical History:  Diagnosis Date   Allergy    Anemia    Anxiety    Depression    Hyperlipidemia    Hypertension    Lumbar stenosis    Migraines    Prediabetes     Immunization History  Administered Date(s) Administered   Influenza, High Dose  05/29/2014, 06/02/2015, 06/16/2017, 05/09/2018, 05/09/2019   Influenza,inj,quad 08/12/2013, 07/04/2016   Influenza 05/25/2020   PFIZER SARS-COV-2 Vacc 09/30/2019, 10/21/2019, 06/22/2020   Pneumococcal -13 04/07/2015   Pneumococcal -23 05/29/2013   Td 08/15/2005, 07/04/2016    Past Surgical History:  Procedure Laterality Date   BREAST LUMPECTOMY Bilateral    CHOLECYSTECTOMY  1999   SPINE SURGERY      FHx:    Reviewed / unchanged  SHx:    Reviewed / unchanged   Systems Review:  Constitutional: Denies fever, chills, wt changes, headaches, insomnia, fatigue, night sweats, change in appetite. Eyes: Denies redness,  blurred vision, diplopia, discharge, itchy, watery eyes.  ENT: Denies discharge, congestion, post nasal drip, epistaxis, sore throat, earache, hearing loss, dental pain, tinnitus, vertigo, sinus pain, snoring.  CV: Denies chest pain, palpitations, irregular heartbeat, syncope, dyspnea, diaphoresis, orthopnea, PND, claudication or edema. Respiratory: denies cough, dyspnea, DOE, pleurisy, hoarseness, laryngitis, wheezing.  Gastrointestinal: Denies dysphagia, odynophagia, heartburn, reflux, water brash, abdominal pain or cramps, nausea, vomiting, bloating, diarrhea, constipation, hematemesis, melena, hematochezia  or hemorrhoids. Genitourinary: Denies dysuria, frequency, urgency, nocturia, hesitancy, discharge, hematuria or flank pain. Musculoskeletal: Denies arthralgias, myalgias, stiffness, jt. swelling, pain, limping or strain/sprain.  Skin: Denies pruritus, rash, hives, warts, acne, eczema or change in skin lesion(s). Neuro: No weakness, tremor, incoordination, spasms, paresthesia or pain. Psychiatric: Denies confusion, memory loss or sensory loss. Endo: Denies change in weight, skin or hair change.  Heme/Lymph: No excessive bleeding, bruising or enlarged lymph nodes.  Physical Exam  BP 118/78   Pulse 68   Temp (!) 97.3 F (36.3 C)   Resp 16   Ht 5\' 3"  (1.6 m)   Wt 157 lb (71.2 kg)   SpO2 98%   BMI 27.81 kg/m   Appears  well nourished, well groomed  and in no distress.  Eyes: PERRLA, EOMs, conjunctiva no swelling or erythema. Sinuses: No frontal/maxillary tenderness ENT/Mouth: EAC's clear, TM's nl w/o erythema, bulging. Nares clear w/o erythema, swelling, exudates. Oropharynx clear without erythema or exudates. Oral hygiene is good. Tongue normal, non obstructing. Hearing intact.  Neck: Supple. Thyroid not palpable. Car 2+/2+ without bruits, nodes or JVD. Chest: Respirations nl with BS clear & equal w/o rales, rhonchi, wheezing or stridor.  Cor: Heart sounds normal w/ regular rate  and rhythm without sig. murmurs, gallops, clicks or rubs. Peripheral pulses normal and equal  without edema.  Abdomen: Soft & bowel sounds normal. Non-tender w/o guarding, rebound, hernias, masses or organomegaly.  Lymphatics: Unremarkable.  Musculoskeletal: Full ROM all peripheral extremities, joint stability, 5/5 strength and normal gait.  Skin: Warm, dry without exposed rashes, lesions or ecchymosis apparent.  Neuro: Cranial nerves intact, reflexes equal bilaterally.  Sensory-motor testing grossly intact. Tendon reflexes grossly intact.  Pysch: Alert & oriented x 3.  Insight and judgement nl & appropriate. No ideations.  Assessment and Plan:  1. Essential hypertension  - Continue medication, monitor blood pressure at home.  - Continue DASH diet.  Reminder to go to the ER if any CP,  SOB, nausea, dizziness, severe HA, changes vision/speech.   - CBC with Differential/Platelet - Magnesium - Lipid panel - COMPLETE METABOLIC PANEL WITH GFR  2. Hyperlipidemia, mixed  - Continue diet/meds, exercise,& lifestyle modifications.  - Continue monitor periodic cholesterol/liver & renal functions    - TSH - COMPLETE METABOLIC PANEL WITH GFR  3. Abnormal glucose  - Continue diet, exercise  - Lifestyle modifications.  - Monitor appropriate labs   - Hemoglobin A1c - Insulin, random  4. Vitamin D deficiency  - Continue supplementation   - VITAMIN D 25 Hydroxy   5. Medication management  - CBC with Differential/Platelet - Magnesium - Lipid panel - TSH - COMPLETE METABOLIC PANEL WITH GFR - Hemoglobin A1c - Insulin, random - VITAMIN D 25 Hydroxy        Discussed  regular exercise, BP monitoring, weight control to achieve/maintain BMI less than 25 and discussed med and SE's. Recommended labs to assess and monitor clinical status with further disposition pending results of labs.  I discussed the assessment and treatment plan with the patient. The patient was provided an opportunity  to ask questions and all were answered. The patient agreed with the plan and demonstrated an understanding of the instructions.  I provided over 30 minutes of exam, counseling, chart review and  complex critical decision making.        The patient was advised to call back or seek an in-person evaluation if the symptoms worsen or if the condition fails to improve as anticipated.   Kirtland Bouchard, MD

## 2021-02-21 NOTE — Patient Instructions (Signed)

## 2021-02-22 ENCOUNTER — Other Ambulatory Visit: Payer: Self-pay

## 2021-02-22 ENCOUNTER — Ambulatory Visit (INDEPENDENT_AMBULATORY_CARE_PROVIDER_SITE_OTHER): Payer: Medicare Other | Admitting: Internal Medicine

## 2021-02-22 ENCOUNTER — Encounter: Payer: Self-pay | Admitting: Internal Medicine

## 2021-02-22 VITALS — BP 118/78 | HR 68 | Temp 97.3°F | Resp 16 | Ht 63.0 in | Wt 157.0 lb

## 2021-02-22 DIAGNOSIS — Z79899 Other long term (current) drug therapy: Secondary | ICD-10-CM | POA: Diagnosis not present

## 2021-02-22 DIAGNOSIS — I10 Essential (primary) hypertension: Secondary | ICD-10-CM

## 2021-02-22 DIAGNOSIS — E559 Vitamin D deficiency, unspecified: Secondary | ICD-10-CM

## 2021-02-22 DIAGNOSIS — E782 Mixed hyperlipidemia: Secondary | ICD-10-CM | POA: Diagnosis not present

## 2021-02-22 DIAGNOSIS — R7309 Other abnormal glucose: Secondary | ICD-10-CM | POA: Diagnosis not present

## 2021-02-23 LAB — COMPLETE METABOLIC PANEL WITH GFR
AG Ratio: 1.5 (calc) (ref 1.0–2.5)
ALT: 11 U/L (ref 6–29)
AST: 17 U/L (ref 10–35)
Albumin: 4 g/dL (ref 3.6–5.1)
Alkaline phosphatase (APISO): 82 U/L (ref 37–153)
BUN/Creatinine Ratio: 14 (calc) (ref 6–22)
BUN: 15 mg/dL (ref 7–25)
CO2: 29 mmol/L (ref 20–32)
Calcium: 9.3 mg/dL (ref 8.6–10.4)
Chloride: 105 mmol/L (ref 98–110)
Creat: 1.08 mg/dL — ABNORMAL HIGH (ref 0.60–1.00)
Globulin: 2.7 g/dL (calc) (ref 1.9–3.7)
Glucose, Bld: 96 mg/dL (ref 65–99)
Potassium: 4.7 mmol/L (ref 3.5–5.3)
Sodium: 142 mmol/L (ref 135–146)
Total Bilirubin: 0.4 mg/dL (ref 0.2–1.2)
Total Protein: 6.7 g/dL (ref 6.1–8.1)
eGFR: 54 mL/min/{1.73_m2} — ABNORMAL LOW (ref >=60)

## 2021-02-23 LAB — LIPID PANEL
Cholesterol: 161 mg/dL (ref <200)
HDL: 44 mg/dL — ABNORMAL LOW (ref >=50)
LDL Cholesterol (Calc): 93 mg/dL (calc)
Non-HDL Cholesterol (Calc): 117 mg/dL (calc) (ref <130)
Total CHOL/HDL Ratio: 3.7 (calc) (ref <5.0)
Triglycerides: 144 mg/dL (ref <150)

## 2021-02-23 LAB — CBC WITH DIFFERENTIAL/PLATELET
Absolute Monocytes: 621 cells/uL (ref 200–950)
Basophils Absolute: 60 cells/uL (ref 0–200)
Basophils Relative: 0.7 %
Eosinophils Absolute: 281 cells/uL (ref 15–500)
Eosinophils Relative: 3.3 %
HCT: 38.1 % (ref 35.0–45.0)
Hemoglobin: 12.3 g/dL (ref 11.7–15.5)
Lymphs Abs: 2278 cells/uL (ref 850–3900)
MCH: 29.9 pg (ref 27.0–33.0)
MCHC: 32.3 g/dL (ref 32.0–36.0)
MCV: 92.5 fL (ref 80.0–100.0)
MPV: 9.6 fL (ref 7.5–12.5)
Monocytes Relative: 7.3 %
Neutro Abs: 5262 cells/uL (ref 1500–7800)
Neutrophils Relative %: 61.9 %
Platelets: 409 10*3/uL — ABNORMAL HIGH (ref 140–400)
RBC: 4.12 10*6/uL (ref 3.80–5.10)
RDW: 12.3 % (ref 11.0–15.0)
Total Lymphocyte: 26.8 %
WBC: 8.5 10*3/uL (ref 3.8–10.8)

## 2021-02-23 LAB — VITAMIN D 25 HYDROXY (VIT D DEFICIENCY, FRACTURES): Vit D, 25-Hydroxy: 47 ng/mL (ref 30–100)

## 2021-02-23 LAB — HEMOGLOBIN A1C
Hgb A1c MFr Bld: 5.3 % of total Hgb (ref <5.7)
Mean Plasma Glucose: 105 mg/dL
eAG (mmol/L): 5.8 mmol/L

## 2021-02-23 LAB — INSULIN, RANDOM: Insulin: 16.3 u[IU]/mL

## 2021-02-23 LAB — TSH: TSH: 4.29 mIU/L (ref 0.40–4.50)

## 2021-02-23 LAB — MAGNESIUM: Magnesium: 2 mg/dL (ref 1.5–2.5)

## 2021-02-24 NOTE — Progress Notes (Signed)
============================================================ -   Test results slightly outside the reference range are not unusual. If there is anything important, I will review this with you,  otherwise it is considered normal test values.  If you have further questions,  please do not hesitate to contact me at the office or via My Chart.  ============================================================ ============================================================  -  Total Chol = 161   and LDL Chol = 93   - Both  Excellent   - Very low risk for Heart Attack  / Stroke ============================================================ ============================================================  - Kidney function tests (GFR) - look a little dehydrated    Very important to drink adequate amounts of fluids to prevent permanent damage    - Recommend drink at least 6 bottles (16 ounces) of fluids /water /day = 96 Oz ~100 oz  - 100 oz = 3,000 cc or 3 liters / day  - >> That's 1 &1/2 bottles of a 2 liter soda bottle /day !  ============================================================ ============================================================  -  A1c = 5.3%  - Back in Normal nonDiabetic range - Great ! ============================================================ ============================================================  -  Vitamin D = 47 - Low   - Vitamin D goal is between 70-100.   - Please make sure that you are taking your Vitamin D 10,000 units /day  - It is very important as a natural anti-inflammatory and helping the  immune system protect against viral infections, like the Covid-19    helping hair, skin, and nails, as well as reducing stroke and  heart attack risk.   - It helps your bones and helps with mood.  - It also decreases numerous cancer risks so please  take it as directed.   - Low Vit D is associated with a 200-300% higher risk for  CANCER   and 200-300% higher  risk for HEART   ATTACK  &  STROKE.    - It is also associated with higher death rate at younger ages,   autoimmune diseases like Rheumatoid arthritis, Lupus,  Multiple Sclerosis.     - Also many other serious conditions, like depression, Alzheimer's  Dementia, infertility, muscle aches, fatigue, fibromyalgia   - just to name a few. ============================================================ ============================================================  -  All Else - CBC - Kidneys - Electrolytes - Liver - Magnesium & Thyroid    - all  Normal / OK  ============================================================ ============================================================

## 2021-02-26 DIAGNOSIS — Z4789 Encounter for other orthopedic aftercare: Secondary | ICD-10-CM | POA: Diagnosis not present

## 2021-02-26 DIAGNOSIS — Z981 Arthrodesis status: Secondary | ICD-10-CM | POA: Diagnosis not present

## 2021-03-12 ENCOUNTER — Other Ambulatory Visit: Payer: Self-pay | Admitting: Internal Medicine

## 2021-03-12 DIAGNOSIS — M545 Low back pain, unspecified: Secondary | ICD-10-CM

## 2021-03-12 DIAGNOSIS — G8929 Other chronic pain: Secondary | ICD-10-CM

## 2021-03-12 MED ORDER — DULOXETINE HCL 30 MG PO CPEP: ORAL_CAPSULE | ORAL | 3 refills | Status: DC

## 2021-04-21 DIAGNOSIS — M5410 Radiculopathy, site unspecified: Secondary | ICD-10-CM | POA: Insufficient documentation

## 2021-04-21 DIAGNOSIS — M5416 Radiculopathy, lumbar region: Secondary | ICD-10-CM | POA: Diagnosis not present

## 2021-04-21 DIAGNOSIS — M47812 Spondylosis without myelopathy or radiculopathy, cervical region: Secondary | ICD-10-CM | POA: Diagnosis not present

## 2021-04-21 DIAGNOSIS — Z4789 Encounter for other orthopedic aftercare: Secondary | ICD-10-CM | POA: Diagnosis not present

## 2021-04-21 DIAGNOSIS — Z981 Arthrodesis status: Secondary | ICD-10-CM | POA: Diagnosis not present

## 2021-05-03 DIAGNOSIS — Z23 Encounter for immunization: Secondary | ICD-10-CM | POA: Diagnosis not present

## 2021-05-20 DIAGNOSIS — Z23 Encounter for immunization: Secondary | ICD-10-CM | POA: Diagnosis not present

## 2021-05-25 DIAGNOSIS — Z01411 Encounter for gynecological examination (general) (routine) with abnormal findings: Secondary | ICD-10-CM | POA: Diagnosis not present

## 2021-05-25 DIAGNOSIS — Z01419 Encounter for gynecological examination (general) (routine) without abnormal findings: Secondary | ICD-10-CM | POA: Diagnosis not present

## 2021-05-25 DIAGNOSIS — Z124 Encounter for screening for malignant neoplasm of cervix: Secondary | ICD-10-CM | POA: Diagnosis not present

## 2021-05-25 DIAGNOSIS — Z6828 Body mass index (BMI) 28.0-28.9, adult: Secondary | ICD-10-CM | POA: Diagnosis not present

## 2021-05-25 DIAGNOSIS — Z1231 Encounter for screening mammogram for malignant neoplasm of breast: Secondary | ICD-10-CM | POA: Diagnosis not present

## 2021-05-25 DIAGNOSIS — Z79890 Hormone replacement therapy (postmenopausal): Secondary | ICD-10-CM | POA: Diagnosis not present

## 2021-05-25 LAB — HM MAMMOGRAPHY

## 2021-06-07 ENCOUNTER — Encounter: Payer: Self-pay | Admitting: Internal Medicine

## 2021-06-08 ENCOUNTER — Encounter: Payer: Self-pay | Admitting: Internal Medicine

## 2021-06-08 ENCOUNTER — Other Ambulatory Visit: Payer: Self-pay

## 2021-06-08 ENCOUNTER — Ambulatory Visit (INDEPENDENT_AMBULATORY_CARE_PROVIDER_SITE_OTHER): Payer: Medicare Other | Admitting: Internal Medicine

## 2021-06-08 VITALS — BP 126/82 | HR 59 | Temp 97.8°F | Resp 16 | Ht 63.5 in | Wt 157.4 lb

## 2021-06-08 DIAGNOSIS — R7309 Other abnormal glucose: Secondary | ICD-10-CM | POA: Diagnosis not present

## 2021-06-08 DIAGNOSIS — E559 Vitamin D deficiency, unspecified: Secondary | ICD-10-CM | POA: Diagnosis not present

## 2021-06-08 DIAGNOSIS — Z136 Encounter for screening for cardiovascular disorders: Secondary | ICD-10-CM | POA: Diagnosis not present

## 2021-06-08 DIAGNOSIS — M7061 Trochanteric bursitis, right hip: Secondary | ICD-10-CM

## 2021-06-08 DIAGNOSIS — F331 Major depressive disorder, recurrent, moderate: Secondary | ICD-10-CM

## 2021-06-08 DIAGNOSIS — E782 Mixed hyperlipidemia: Secondary | ICD-10-CM

## 2021-06-08 DIAGNOSIS — I10 Essential (primary) hypertension: Secondary | ICD-10-CM | POA: Diagnosis not present

## 2021-06-08 DIAGNOSIS — Z8249 Family history of ischemic heart disease and other diseases of the circulatory system: Secondary | ICD-10-CM | POA: Diagnosis not present

## 2021-06-08 DIAGNOSIS — G894 Chronic pain syndrome: Secondary | ICD-10-CM | POA: Diagnosis not present

## 2021-06-08 DIAGNOSIS — Z79899 Other long term (current) drug therapy: Secondary | ICD-10-CM | POA: Diagnosis not present

## 2021-06-08 DIAGNOSIS — Z1211 Encounter for screening for malignant neoplasm of colon: Secondary | ICD-10-CM

## 2021-06-08 MED ORDER — GABAPENTIN 100 MG PO CAPS: ORAL_CAPSULE | ORAL | 3 refills | Status: DC

## 2021-06-08 MED ORDER — DEXAMETHASONE 4 MG PO TABS: ORAL_TABLET | ORAL | 1 refills | Status: DC

## 2021-06-08 NOTE — Patient Instructions (Signed)

## 2021-06-08 NOTE — Progress Notes (Signed)
Comprehensive Evaluation & Examination  Future Appointments  Date Time Provider [place]  06/08/2021  2:00 PM Unk Pinto, MD [facility] None  11/15/2021  2:30 PM Liane Comber, NP [facility] None  06/08/2022  2:00 PM Unk Pinto, MD [facility] None            This very nice 75 y.o. DWF presents for a comprehensive evaluation and management of multiple medical co-morbidities.  Patient has been followed for HTN, HLD, Prediabetes and Vitamin D Deficiency. Patient has hx/o Depression controlled & in remission on her Duloxetine.       Labile HTN predates  circa 2018 .  Patient's BP has been controlled at home.  Today's BP is at goal - 126/82. Patient denies any cardiac symptoms as chest pain, palpitations, shortness of breath, dizziness or ankle swelling.       Patient's hyperlipidemia is controlled with diet [place] Keto.  Patient denies myalgias or other medication SE's. Last lipids were at goal :  Lab Results  Component Value Date   CHOL 161 02/22/2021   HDL 44 (L) 02/22/2021   LDLCALC 93 02/22/2021   TRIG 144 02/22/2021   CHOLHDL 3.7 02/22/2021         Patient has hx/o prediabetes (A1c 5.8% /2015) and patient denies reactive hypoglycemic symptoms, visual blurring, diabetic polys or paresthesias. Last A1c was normal & at goal :   Lab Results  Component Value Date   HGBA1C 5.3 02/22/2021          Finally, patient has history of Vitamin D Deficiency ("36" /2016)  and last vitamin D was still low (goal 70-100) :   Lab Results  Component Value Date   VD25OH 47 02/22/2021     Current Outpatient Medications on File Prior to Visit  Medication Sig   ALPRAZolam (XANAX) 0.25 MG tablet Take 1/2 to 1 tablet 2 to 3 x / day ONLY if needed for Acute Anxiety Attacks  & please try to limit to 5 days / week to avoid addiction   Ascorbic Acid (VITAMIN C PO) 500 mg 2 (two) times daily.   Cholecalciferol (VITAMIN D PO) Take by mouth. Takes 10000 to 12000 units  daily.   diclofenac sodium (VOLTAREN) 1 % GEL Apply 2 g topically 4 (four) times daily.   DULoxetine (CYMBALTA) 30 MG capsule Take  3 capsules (90 mg)  Daily  for Mood & Chronic Pain   Elastic Bandages & Supports (MEDICAL COMPRESSION STOCKINGS) MISC Knee high compression socks 20-30 pressure, measure and fit   estradiol (ESTRACE) 0.5 MG tablet Take  1 tablet  Daily  for Estrogen Deficiency   fexofenadine (ALLEGRA) 180 MG tablet Take 180 mg by mouth daily.   gabapentin (NEURONTIN) 300 MG capsule Take 1 capsule (300 mg total) by mouth 3 (three) times daily.   hydrochlorothiazide (HYDRODIURIL) 25 MG tablet Take 1/2 -1 tablet daily for BP and fluid for goal <130/80.   pravastatin (PRAVACHOL) 40 MG tablet Take 1 tablet at Bedtime for Cholesterol   progesterone (PROMETRIUM) 100 MG capsule Take  1 capsule  Daily   rizatriptan (MAXALT) 10 MG tablet Take  1 tablet  for Migraine & may repeat 1 x in 2 hrs -[ Maximum 2 tablets  /24 hours ]   metoprolol tartrate (LOPRESSOR) 25 MG tablet Take 0.5 tablets (12.5 mg total) by mouth 2 (two) times daily.      Allergies  Allergen Reactions   Augmentin [Amoxicillin-Pot Clavulanate] Diarrhea   Doxycycline Nausea Only  Egg White (Diagnostic) Nausea Only   Prozac [Fluoxetine Hcl] Other (See Comments)    Disoriented     Past Medical History:  Diagnosis Date   Allergy    Anemia    Anxiety    Depression    Hyperlipidemia    Hypertension    Lumbar stenosis    Migraines    Prediabetes      Health Maintenance  Topic Date Due   Zoster Vaccines- Shingrix (1 of 2) Never done   DEXA SCAN  02/13/2012   Fecal DNA (Cologuard)  10/26/2020   INFLUENZA VACCINE  03/15/2021   COVID-19 Vaccine (5 - Booster for Pfizer series) 04/03/2021   MAMMOGRAM  05/25/2022   TETANUS/TDAP  07/04/2026   Pneumonia Vaccine 10+ Years old  Completed   Hepatitis C Screening  Completed   HPV VACCINES  Aged Out     Immunization History  Administered Date(s) Administered    Influenza, High Dose  05/29/2014, 06/02/2015, 06/16/2017, 05/09/2018, 05/09/2019   Influenza,inj,quad 08/12/2013, 07/04/2016   Influenza 05/25/2020   PFIZER SARS-COV-2 Vacc 09/30/2019, 10/21/2019, 06/22/2020, 02/06/2021   PNEUMOCOCCAL -20 02/06/2021   Pneumococcal -13 04/07/2015   Pneumococcal -23 05/29/2013   Td 08/15/2005, 07/04/2016    Cologard - 10/26/2017 - Negative - Recc 3 yr f/u due Mar 2022  Last MGM - 10/28/2019 - Laurin Coder, FNP at [facility]  Past Surgical History:  Procedure Laterality Date   BREAST LUMPECTOMY Bilateral    CHOLECYSTECTOMY  1999   SPINE SURGERY       Family History  Problem Relation Age of Onset   Stroke Mother    Hypertension Mother    Heart attack Father 54   Other Maternal Grandmother        blood clot after surgery   Heart attack Paternal Grandmother      Social History   Tobacco Use   Smoking status: Never   Smokeless tobacco: Never  Vaping Use   Vaping Use: Never used  Substance Use Topics   Alcohol use: Yes    Alcohol/week: 2.0 standard drinks    Types: 2 Glasses of wine per week   Drug use: No      ROS Constitutional: Denies fever, chills, weight loss/gain, headaches, insomnia,  night sweats or change in appetite. Does c/o fatigue. Eyes: Denies redness, blurred vision, diplopia, discharge, itchy or watery eyes.  ENT: Denies discharge, congestion, post nasal drip, epistaxis, sore throat, earache, hearing loss, dental pain, Tinnitus, Vertigo, Sinus pain or snoring.  Cardio: Denies chest pain, palpitations, irregular heartbeat, syncope, dyspnea, diaphoresis, orthopnea, PND, claudication or edema Respiratory: denies cough, dyspnea, DOE, pleurisy, hoarseness, laryngitis or wheezing.  Gastrointestinal: Denies dysphagia, heartburn, reflux, water brash, pain, cramps, nausea, vomiting, bloating, diarrhea, constipation, hematemesis, melena, hematochezia, jaundice or hemorrhoids Genitourinary: Denies dysuria, frequency, urgency,  nocturia, hesitancy, discharge, hematuria or flank pain Musculoskeletal: Denies arthralgia, myalgia, stiffness, Jt. Swelling, pain, limp or strain/sprain. Denies Falls. Skin: Denies puritis, rash, hives, warts, acne, eczema or change in skin lesion Neuro: No weakness, tremor, incoordination, spasms, paresthesia or pain Psychiatric: Denies confusion, memory loss or sensory loss. Denies Depression. Endocrine: Denies change in weight, skin, hair change, nocturia, and paresthesia, diabetic polys, visual blurring or hyper / hypo glycemic episodes.  Heme/Lymph: No excessive bleeding, bruising or enlarged lymph nodes.   Physical Exam  BP 126/82   Pulse (!) 59   Temp 97.8 F (36.6 C)   Resp 16   Ht 5' 3.5" (1.613 m)   Wt 157 lb 6.4  oz (71.4 kg)   SpO2 97%   BMI 27.44 kg/m   General Appearance: Well nourished and well groomed and in no apparent distress.  Eyes: PERRLA, EOMs, conjunctiva no swelling or erythema, normal fundi and vessels. Sinuses: No frontal/maxillary tenderness ENT/Mouth: EACs patent / TMs  nl. Nares clear without erythema, swelling, mucoid exudates. Oral hygiene is good. No erythema, swelling, or exudate. Tongue normal, non-obstructing. Tonsils not swollen or erythematous. Hearing normal.  Neck: Supple, thyroid not palpable. No bruits, nodes or JVD. Respiratory: Respiratory effort normal.  BS equal and clear bilateral without rales, rhonci, wheezing or stridor. Cardio: Heart sounds are normal with regular rate and rhythm and no murmurs, rubs or gallops. Peripheral pulses are normal and equal bilaterally without edema. No aortic or femoral bruits. Chest: symmetric with normal excursions and percussion.  Abdomen: Soft, with Nl bowel sounds. Nontender, no guarding, rebound, hernias, masses, or organomegaly.  Lymphatics: Non tender without lymphadenopathy.  Musculoskeletal: Full ROM all peripheral extremities, joint stability, 5/5 strength, and normal gait. Skin: Warm and dry  without rashes, lesions, cyanosis, clubbing or  ecchymosis.  Neuro: Cranial nerves intact, reflexes equal bilaterally. Normal muscle tone, no cerebellar symptoms. Sensation intact.  Pysch: Alert and oriented X 3 with normal affect, insight and judgment appropriate.   Assessment and Plan  1. Essential hypertension  - EKG 12-Lead - Urinalysis, Routine w reflex microscopic - Microalbumin / creatinine urine ratio - CBC with Differential/Platelet - COMPLETE METABOLIC PANEL WITH GFR - Magnesium - TSH  2. Hyperlipidemia, mixed  - EKG 12-Lead - Lipid panel - TSH  3. Abnormal glucose  - EKG 12-Lead - Hemoglobin A1c - Insulin, random  4. Vitamin D deficiency  - VITAMIN D 25 Hydroxy  5. Moderate episode of recurrent major depressive disorder (HCC)  - TSH  6. Screening for colorectal cancer  - POC Hemoccult Bld/Stl   7. Screening for heart disease  - EKG 12-Lead  8. Family history of ischemic heart disease  - EKG 12-Lead  9. Medication management  - CBC with Differential/Platelet - COMPLETE METABOLIC PANEL WITH GFR - Magnesium - Lipid panel - TSH - Hemoglobin A1c - Insulin, random - VITAMIN D 25 Hydroxy        Patient was counseled in prudent diet, weight control to achieve/maintain BMI less than 25, BP monitoring, regular exercise and medications as discussed.  Discussed med effects and SE's. Routine screening labs and tests as requested with regular follow-up as recommended. Over 40 minutes of exam, counseling, chart review and high complex critical decision making was performed   Kirtland Bouchard, MD

## 2021-06-09 LAB — COMPLETE METABOLIC PANEL WITH GFR
AG Ratio: 1.9 (calc) (ref 1.0–2.5)
ALT: 13 U/L (ref 6–29)
AST: 19 U/L (ref 10–35)
Albumin: 4.4 g/dL (ref 3.6–5.1)
Alkaline phosphatase (APISO): 66 U/L (ref 37–153)
BUN/Creatinine Ratio: 15 (calc) (ref 6–22)
BUN: 16 mg/dL (ref 7–25)
CO2: 29 mmol/L (ref 20–32)
Calcium: 9.4 mg/dL (ref 8.6–10.4)
Chloride: 106 mmol/L (ref 98–110)
Creat: 1.08 mg/dL — ABNORMAL HIGH (ref 0.60–1.00)
Globulin: 2.3 g/dL (calc) (ref 1.9–3.7)
Glucose, Bld: 87 mg/dL (ref 65–99)
Potassium: 5 mmol/L (ref 3.5–5.3)
Sodium: 141 mmol/L (ref 135–146)
Total Bilirubin: 0.4 mg/dL (ref 0.2–1.2)
Total Protein: 6.7 g/dL (ref 6.1–8.1)
eGFR: 54 mL/min/{1.73_m2} — ABNORMAL LOW (ref >=60)

## 2021-06-09 LAB — VITAMIN D 25 HYDROXY (VIT D DEFICIENCY, FRACTURES): Vit D, 25-Hydroxy: 48 ng/mL (ref 30–100)

## 2021-06-09 LAB — LIPID PANEL
Cholesterol: 130 mg/dL (ref <200)
HDL: 45 mg/dL — ABNORMAL LOW (ref >=50)
LDL Cholesterol (Calc): 66 mg/dL (calc)
Non-HDL Cholesterol (Calc): 85 mg/dL (calc) (ref <130)
Total CHOL/HDL Ratio: 2.9 (calc) (ref <5.0)
Triglycerides: 106 mg/dL (ref <150)

## 2021-06-09 LAB — TSH: TSH: 3.95 mIU/L (ref 0.40–4.50)

## 2021-06-09 LAB — URINALYSIS, ROUTINE W REFLEX MICROSCOPIC
Bilirubin Urine: NEGATIVE
Glucose, UA: NEGATIVE
Hgb urine dipstick: NEGATIVE
Ketones, ur: NEGATIVE
Leukocytes,Ua: NEGATIVE
Nitrite: NEGATIVE
Protein, ur: NEGATIVE
Specific Gravity, Urine: 1.008 (ref 1.001–1.035)
pH: 5.5 (ref 5.0–8.0)

## 2021-06-09 LAB — CBC WITH DIFFERENTIAL/PLATELET
Absolute Monocytes: 664 cells/uL (ref 200–950)
Basophils Absolute: 48 cells/uL (ref 0–200)
Basophils Relative: 0.6 %
Eosinophils Absolute: 128 cells/uL (ref 15–500)
Eosinophils Relative: 1.6 %
HCT: 41.7 % (ref 35.0–45.0)
Hemoglobin: 13.8 g/dL (ref 11.7–15.5)
Lymphs Abs: 2232 cells/uL (ref 850–3900)
MCH: 29.7 pg (ref 27.0–33.0)
MCHC: 33.1 g/dL (ref 32.0–36.0)
MCV: 89.9 fL (ref 80.0–100.0)
MPV: 10.4 fL (ref 7.5–12.5)
Monocytes Relative: 8.3 %
Neutro Abs: 4928 cells/uL (ref 1500–7800)
Neutrophils Relative %: 61.6 %
Platelets: 319 10*3/uL (ref 140–400)
RBC: 4.64 10*6/uL (ref 3.80–5.10)
RDW: 13.1 % (ref 11.0–15.0)
Total Lymphocyte: 27.9 %
WBC: 8 10*3/uL (ref 3.8–10.8)

## 2021-06-09 LAB — MAGNESIUM: Magnesium: 2.2 mg/dL (ref 1.5–2.5)

## 2021-06-09 LAB — HEMOGLOBIN A1C
Hgb A1c MFr Bld: 5.5 % of total Hgb (ref <5.7)
Mean Plasma Glucose: 111 mg/dL
eAG (mmol/L): 6.2 mmol/L

## 2021-06-09 LAB — MICROALBUMIN / CREATININE URINE RATIO
Creatinine, Urine: 41 mg/dL (ref 20–275)
Microalb, Ur: <0.2 mg/dL

## 2021-06-09 LAB — INSULIN, RANDOM: Insulin: 20.6 u[IU]/mL — ABNORMAL HIGH

## 2021-06-09 NOTE — Progress Notes (Signed)
============================================================ -   Test results slightly outside the reference range are not unusual. If there is anything important, I will review this with you,  otherwise it is considered normal test values.  If you have further questions,  please do not hesitate to contact me at the office or via My Chart.  ============================================================ ============================================================  -  U/A - OK - No Infection ============================================================ ============================================================  -  A1c  - Normal - No Diabetes  - Great ! ============================================================ ============================================================  -  Vitamin D = 48 - Low   - Vitamin D goal is between 70-100.   - Please make sure that you are taking your Vitamin D 10,000 units /day  as directed.   - It is very important as a natural anti-inflammatory and helping the  immune system protect against viral infections, like the Covid-19    helping hair, skin, and nails, as well as reducing stroke and  heart attack risk.   - It helps your bones and helps with mood.  - It also decreases numerous cancer risks so please  take it as directed.   - Low Vit D is associated with a 200-300% higher risk for  CANCER   and 200-300% higher risk for HEART   ATTACK  &  STROKE.    - It is also associated with higher death rate at younger ages,   autoimmune diseases like Rheumatoid arthritis, Lupus,  Multiple Sclerosis.     - Also many other serious conditions, like depression, Alzheimer's  Dementia, infertility, muscle aches, fatigue, fibromyalgia   - just to name a few. ============================================================ ============================================================  -  Kidney functions remain Stage 3a  & Stable   ============================================================ ============================================================  -   -  Total  Chol =    130    - Excellent  !            (  Ideal  or  Goal is less than 180  !  )   - and   -  Bad / Dangerous LDL  Chol =   66    - also Excellent             (  Ideal  or  Goal is less than 70  !  )  ============================================================ ============================================================  -  All Else - CBC - Kidneys - Electrolytes - Liver - Magnesium & Thyroid    - all  Normal / OK ============================================================ ============================================================  -  Keep up the [place] Work  !  ============================================================ ============================================================

## 2021-06-14 DIAGNOSIS — M5410 Radiculopathy, site unspecified: Secondary | ICD-10-CM | POA: Diagnosis not present

## 2021-06-14 DIAGNOSIS — M4804 Spinal stenosis, thoracic region: Secondary | ICD-10-CM | POA: Diagnosis not present

## 2021-06-14 DIAGNOSIS — R531 Weakness: Secondary | ICD-10-CM | POA: Diagnosis not present

## 2021-06-14 DIAGNOSIS — M25551 Pain in right hip: Secondary | ICD-10-CM | POA: Diagnosis not present

## 2021-06-14 DIAGNOSIS — M5416 Radiculopathy, lumbar region: Secondary | ICD-10-CM | POA: Diagnosis not present

## 2021-06-14 DIAGNOSIS — Z4789 Encounter for other orthopedic aftercare: Secondary | ICD-10-CM | POA: Diagnosis not present

## 2021-06-14 DIAGNOSIS — R29898 Other symptoms and signs involving the musculoskeletal system: Secondary | ICD-10-CM | POA: Diagnosis not present

## 2021-06-14 DIAGNOSIS — M79651 Pain in right thigh: Secondary | ICD-10-CM | POA: Diagnosis not present

## 2021-06-14 DIAGNOSIS — Z981 Arthrodesis status: Secondary | ICD-10-CM | POA: Diagnosis not present

## 2021-06-14 DIAGNOSIS — M4722 Other spondylosis with radiculopathy, cervical region: Secondary | ICD-10-CM | POA: Diagnosis not present

## 2021-06-16 DIAGNOSIS — M5137 Other intervertebral disc degeneration, lumbosacral region: Secondary | ICD-10-CM | POA: Diagnosis not present

## 2021-06-16 DIAGNOSIS — M545 Low back pain, unspecified: Secondary | ICD-10-CM | POA: Diagnosis not present

## 2021-06-16 DIAGNOSIS — M5410 Radiculopathy, site unspecified: Secondary | ICD-10-CM | POA: Diagnosis not present

## 2021-06-16 DIAGNOSIS — Z981 Arthrodesis status: Secondary | ICD-10-CM | POA: Diagnosis not present

## 2021-06-30 DIAGNOSIS — H2513 Age-related nuclear cataract, bilateral: Secondary | ICD-10-CM | POA: Diagnosis not present

## 2021-06-30 DIAGNOSIS — H25013 Cortical age-related cataract, bilateral: Secondary | ICD-10-CM | POA: Diagnosis not present

## 2021-06-30 DIAGNOSIS — H35013 Changes in retinal vascular appearance, bilateral: Secondary | ICD-10-CM | POA: Diagnosis not present

## 2021-06-30 DIAGNOSIS — H2512 Age-related nuclear cataract, left eye: Secondary | ICD-10-CM | POA: Diagnosis not present

## 2021-06-30 DIAGNOSIS — H04123 Dry eye syndrome of bilateral lacrimal glands: Secondary | ICD-10-CM | POA: Diagnosis not present

## 2021-06-30 LAB — HM DIABETES EYE EXAM

## 2021-07-06 ENCOUNTER — Telehealth: Payer: Self-pay | Admitting: Internal Medicine

## 2021-07-06 NOTE — Chronic Care Management (AMB) (Signed)
  Chronic Care Management   Note  07/06/2021 Name: Rebekah Hamilton MRN: [id number] DOB: 1946-01-28  Rebekah Hamilton Rebekah Hamilton is a 75 y.o. year old female who is a primary care patient of Unk Pinto, MD. I reached out to Cherlyn Roberts by phone today in response to a referral sent by Ms. Rebekah Hamilton's PCP, Unk Pinto, MD.   Rebekah Hamilton was given information about Chronic Care Management services today including:  CCM service includes personalized support from designated clinical staff supervised by her physician, including individualized plan of care and coordination with other care providers 24/7 contact phone numbers for assistance for urgent and routine care needs. Service will only be billed when office clinical staff spend 20 minutes or more in a month to coordinate care. Only one practitioner may furnish and bill the service in a calendar month. The patient may stop CCM services at any time (effective at the end of the month) by phone call to the office staff.   Patient agreed to services and verbal consent obtained.   Follow up plan:   Rebekah Hamilton

## 2021-07-13 DIAGNOSIS — H2512 Age-related nuclear cataract, left eye: Secondary | ICD-10-CM | POA: Diagnosis not present

## 2021-07-13 DIAGNOSIS — H25812 Combined forms of age-related cataract, left eye: Secondary | ICD-10-CM | POA: Diagnosis not present

## 2021-07-15 ENCOUNTER — Ambulatory Visit (INDEPENDENT_AMBULATORY_CARE_PROVIDER_SITE_OTHER): Payer: Medicare Other | Admitting: Specialist

## 2021-07-15 ENCOUNTER — Ambulatory Visit: Payer: Self-pay

## 2021-07-15 ENCOUNTER — Encounter: Payer: Self-pay | Admitting: Specialist

## 2021-07-15 ENCOUNTER — Other Ambulatory Visit: Payer: Self-pay

## 2021-07-15 VITALS — BP 148/86 | HR 69 | Ht 63.5 in | Wt 157.5 lb

## 2021-07-15 DIAGNOSIS — M25559 Pain in unspecified hip: Secondary | ICD-10-CM

## 2021-07-15 DIAGNOSIS — M4325 Fusion of spine, thoracolumbar region: Secondary | ICD-10-CM | POA: Diagnosis not present

## 2021-07-15 DIAGNOSIS — M7061 Trochanteric bursitis, right hip: Secondary | ICD-10-CM | POA: Diagnosis not present

## 2021-07-15 MED ORDER — BUPIVACAINE HCL 0.5 % IJ SOLN
5.0000 mL | INTRAMUSCULAR | Status: AC | PRN
Start: 2021-07-15 — End: 2021-07-15
  Administered 2021-07-15: 5 mL via INTRA_ARTICULAR

## 2021-07-15 MED ORDER — METHYLPREDNISOLONE ACETATE 40 MG/ML IJ SUSP
80.0000 mg | INTRAMUSCULAR | Status: AC | PRN
  Administered 2021-07-15: 80 mg via INTRA_ARTICULAR

## 2021-07-15 NOTE — Patient Instructions (Signed)
Plan: Avoid frequent bending and stooping  No lifting greater than 10 lbs. May use ice or moist heat for pain. Weight loss is of benefit. Best medication for lumbar disc disease is arthritis medications like tylenol up to 6x500 mg tablets per day Decrease gabapentin by one tablet Exercise is important to improve your indurance and does allow people to function better inspite of back pain. Stretching exercises for the greater trochanter, yoga or tau chi

## 2021-07-15 NOTE — Progress Notes (Signed)
Office Visit Note   Patient: Rebekah Hamilton           Date of Birth: 16-May-1946           MRN: [id number] Visit Date: 07/15/2021              Requested by: Unk Pinto, [address] [place],  [place] [address] PCP: Unk Pinto, MD   Assessment & Plan: Visit Diagnoses:  1. Hip pain   2. Fusion of spine of thoracolumbar region   3. Greater trochanteric bursitis, right     Plan: Avoid frequent bending and stooping  No lifting greater than 10 lbs. May use ice or moist heat for pain. Weight loss is of benefit. Best medication for lumbar disc disease is arthritis medications like tylenol up to 6x500 mg tablets per day Decrease gabapentin by one tablet Exercise is important to improve your indurance and does allow people to function better inspite of back pain. Stretching exercises for the greater trochanter, yoga or tau chi  Follow-Up Instructions: No follow-ups on file.   Orders:  Orders Placed This Encounter  Procedures  . XR HIP UNILAT W OR W/O PELVIS 2-3 VIEWS RIGHT   No orders of the defined types were placed in this encounter.     Procedures: Large Joint Inj: R greater trochanter on 07/15/2021 11:06 AM Indications: pain Details: 22 G 3.5 in needle, superolateral approach  Arthrogram: No  Medications: 5 mL bupivacaine 0.5 %; 80 mg methylPREDNISolone acetate 40 MG/ML Outcome: tolerated well, no immediate complications Procedure, treatment alternatives, risks and benefits explained, specific risks discussed. Consent was given by the patient. Immediately prior to procedure a time out was called to verify the correct patient, procedure, equipment, support staff and site/side marked as required. Patient was prepped and draped in the usual sterile fashion.     Clinical Data: No additional findings.   Subjective: Chief Complaint  Patient presents with  . Right Hip - Pain    75 year old female with history of long thoracolumbar  scoliosis with severe foramenal stenosis and she is having pain following a long segment fusion with vertebral osteotomy. She has had a revision surgery for loosening of hardware and is experiencing pain into the right groin and right anterior thigh and knee and pain now that is localizing to the right lateral hip region.   Review of Systems  Constitutional: Negative.   HENT: Negative.    Eyes: Negative.   Respiratory: Negative.    Cardiovascular: Negative.   Gastrointestinal: Negative.   Endocrine: Negative.   Genitourinary: Negative.   Musculoskeletal: Negative.   Skin: Negative.   Allergic/Immunologic: Negative.   Neurological: Negative.   Hematological: Negative.   Psychiatric/Behavioral: Negative.      Objective: Vital Signs: BP (!) 148/86 (BP Location: Left Arm, Patient Position: Sitting)   Pulse 69   Ht 5' 3.5" (1.613 m)   Wt 157 lb 8 oz (71.4 kg)   BMI 27.46 kg/m   Physical Exam Constitutional:      Appearance: She is well-developed.  HENT:     Head: Normocephalic and atraumatic.  Eyes:     Pupils: Pupils are equal, round, and reactive to light.  Pulmonary:     Effort: Pulmonary effort is normal.     Breath sounds: Normal breath sounds.  Abdominal:     General: Bowel sounds are normal.     Palpations: Abdomen is soft.  Musculoskeletal:     Cervical back: Normal  range of motion and neck supple.  Skin:    General: Skin is warm and dry.  Neurological:     Mental Status: She is alert and oriented to person, place, and time.  Psychiatric:        Behavior: Behavior normal.        Thought Content: Thought content normal.        Judgment: Judgment normal.    Back Exam   Tenderness  The patient is experiencing tenderness in the lumbar.  Range of Motion  Extension:  abnormal  Flexion:  abnormal  Lateral bend right:  abnormal  Lateral bend left:  abnormal  Rotation right:  abnormal  Rotation left:  abnormal   Muscle Strength  Right Quadriceps:  4/5   Left Quadriceps:  5/5  Right Hamstrings:  5/5  Left Hamstrings:  5/5   Comments:  Right hip flexion 4/5 Right greater trochanter tenderness Pain with hip flexion and adduction lateral right greater trochanter.     Specialty Comments:  No specialty comments available.  Imaging: No results found.   PMFS History: Patient Active Problem List   Diagnosis Date Noted  . S/P lumbar fusion 11/12/2020  . Chronic venous insufficiency 05/07/2019  . History of 2019 novel coronavirus disease (COVID-19) 05/07/2019  . Chronic left-sided low back pain with left-sided sciatica 07/02/2018  . OAB (overactive bladder) 04/02/2018  . CKD (chronic kidney disease), symptom management only, stage 2 (mild) 09/24/2017  . Encounter for Medicare annual wellness exam 06/02/2015  . Glaucoma 06/02/2015  . Vitamin D deficiency 10/21/2014  . Medication management 12/01/2013  . Hyperlipidemia, mixed   . Essential hypertension   . Abnormal glucose   . Migraines   . Allergy   . Anxiety   . Depression, major, recurrent ([place])   . Lumbar stenosis    Past Medical History:  Diagnosis Date  . Allergy   . Anemia   . Anxiety   . Depression   . Hyperlipidemia   . Hypertension   . Lumbar stenosis   . Migraines   . Prediabetes     Family History  Problem Relation Age of Onset  . Stroke Mother   . Hypertension Mother   . Heart attack Father 3  . Other Maternal Grandmother        blood clot after surgery  . Heart attack Paternal Grandmother     Past Surgical History:  Procedure Laterality Date  . BREAST LUMPECTOMY Bilateral   . CHOLECYSTECTOMY  1999  . SPINE SURGERY     Social History   Occupational History  . Occupation: Optometrist  Tobacco Use  . Smoking status: Never  . Smokeless tobacco: Never  Vaping Use  . Vaping Use: Never used  Substance and Sexual Activity  . Alcohol use: Yes    Alcohol/week: 2.0 standard drinks    Types: 2 Glasses of wine per week  . Drug use: No  . Sexual  activity: Not Currently

## 2021-07-21 ENCOUNTER — Encounter: Payer: Self-pay | Admitting: Internal Medicine

## 2021-08-11 DIAGNOSIS — H2511 Age-related nuclear cataract, right eye: Secondary | ICD-10-CM | POA: Diagnosis not present

## 2021-08-11 DIAGNOSIS — H25011 Cortical age-related cataract, right eye: Secondary | ICD-10-CM | POA: Diagnosis not present

## 2021-08-12 ENCOUNTER — Other Ambulatory Visit: Payer: Self-pay

## 2021-08-12 ENCOUNTER — Ambulatory Visit (INDEPENDENT_AMBULATORY_CARE_PROVIDER_SITE_OTHER): Payer: Medicare Other | Admitting: Specialist

## 2021-08-12 ENCOUNTER — Encounter: Payer: Self-pay | Admitting: Specialist

## 2021-08-12 VITALS — BP 155/93 | HR 76 | Ht 63.5 in | Wt 157.5 lb

## 2021-08-12 DIAGNOSIS — G894 Chronic pain syndrome: Secondary | ICD-10-CM

## 2021-08-12 DIAGNOSIS — R2689 Other abnormalities of gait and mobility: Secondary | ICD-10-CM | POA: Diagnosis not present

## 2021-08-12 DIAGNOSIS — R278 Other lack of coordination: Secondary | ICD-10-CM

## 2021-08-12 DIAGNOSIS — R29898 Other symptoms and signs involving the musculoskeletal system: Secondary | ICD-10-CM | POA: Diagnosis not present

## 2021-08-12 DIAGNOSIS — W19XXXA Unspecified fall, initial encounter: Secondary | ICD-10-CM | POA: Diagnosis not present

## 2021-08-12 DIAGNOSIS — M7061 Trochanteric bursitis, right hip: Secondary | ICD-10-CM

## 2021-08-12 MED ORDER — GABAPENTIN 100 MG PO CAPS: 100.0000 mg | ORAL_CAPSULE | Freq: Every day | ORAL | 1 refills | Status: DC

## 2021-08-12 NOTE — Progress Notes (Addendum)
Office Visit Note   Patient: Rebekah Hamilton           Date of Birth: Jul 11, 1946           MRN: [id number] Visit Date: 08/12/2021              Requested by: Unk Pinto, [address] [place],  [place] [address] PCP: Unk Pinto, MD   Assessment & Plan: Visit Diagnoses:  1. Weakness of right leg   2. Balance problem   3. Coordination impairment   4. Fall, initial encounter   5. Pain syndrome, chronic   6. Greater trochanteric bursitis, right     Plan: Avoid frequent bending and stooping  No lifting greater than 10 lbs. May use ice or moist heat for pain. Weight loss is of benefit. Best medication for lumbar disc disease is arthritis medications like motrin, celebrex and naprosyn. Exercise is important to improve your indurance and does allow people to function better inspite of back pain.  PT at [facility] for balance and coordination and right greater trochanteric bursitis.  Follow-Up Instructions: Return in about 4 weeks (around 09/09/2021).   Orders:  Orders Placed This Encounter  Procedures   Ambulatory referral to Physical Therapy   Meds ordered this encounter  Medications   gabapentin (NEURONTIN) 100 MG capsule    Sig: Take 1 capsule (100 mg total) by mouth at bedtime. Take  1 capsule  4 x /day  for Pain    Dispense:  90 capsule    Refill:  1      Procedures: No procedures performed   Clinical Data: No additional findings.   Subjective: Chief Complaint  Patient presents with   Right Hip - Follow-up    Had great er troch injection 07/15/21, she states that it did help her. States that she fell x 10 days ago landing on her knees, and she hit her head, no loss of consciousness just a headache for a while. She states that she is wobbly when she walks.     75 year old female right handed taking gabapentin and fell about 10 days ago. No LOC but had right frontal hematoma. No visual disturbances, no extremity weakness or sluring  of words.   Review of Systems  Constitutional: Negative.   HENT: Negative.    Eyes: Negative.   Respiratory: Negative.    Cardiovascular: Negative.   Gastrointestinal: Negative.   Endocrine: Negative.   Genitourinary: Negative.   Musculoskeletal: Negative.   Skin: Negative.   Allergic/Immunologic: Negative.   Neurological: Negative.   Hematological: Negative.   Psychiatric/Behavioral: Negative.      Objective: Vital Signs: BP (!) 155/93 (BP Location: Left Arm, Patient Position: Sitting)    Pulse 76    Ht 5' 3.5" (1.613 m)    Wt 157 lb 8 oz (71.4 kg)    BMI 27.46 kg/m   Physical Exam Musculoskeletal:     Lumbar back: Negative right straight leg raise test and negative left straight leg raise test.   Back Exam   Tenderness  The patient is experiencing tenderness in the lumbar.  Range of Motion  Extension:  abnormal  Flexion:  abnormal  Lateral bend right:  abnormal  Lateral bend left:  abnormal  Rotation right:  abnormal  Rotation left:  abnormal   Muscle Strength  Right Quadriceps:  4/5  Left Quadriceps:  5/5  Right Hamstrings:  5/5  Left Hamstrings:  5/5   Tests  Straight leg  raise right: negative Straight leg raise left: negative  Reflexes  Patellar:  2/4 Achilles:  2/4 Babinski's sign: normal   Comments:  Right frontal hematoma     Specialty Comments:  No specialty comments available.  Imaging: No results found.   PMFS History: Patient Active Problem List   Diagnosis Date Noted   S/P lumbar fusion 11/12/2020   Chronic venous insufficiency 05/07/2019   History of 2019 novel coronavirus disease (COVID-19) 05/07/2019   Chronic left-sided low back pain with left-sided sciatica 07/02/2018   OAB (overactive bladder) 04/02/2018   CKD (chronic kidney disease), symptom management only, stage 2 (mild) 09/24/2017   Encounter for Medicare annual wellness exam 06/02/2015   Glaucoma 06/02/2015   Vitamin D deficiency 10/21/2014   Medication management  12/01/2013   Hyperlipidemia, mixed    Essential hypertension    Abnormal glucose    Migraines    Allergy    Anxiety    Depression, major, recurrent ([facility])    Lumbar stenosis    Past Medical History:  Diagnosis Date   Allergy    Anemia    Anxiety    Depression    Hyperlipidemia    Hypertension    Lumbar stenosis    Migraines    Prediabetes     Family History  Problem Relation Age of Onset   Stroke Mother    Hypertension Mother    Heart attack Father 32   Other Maternal Grandmother        blood clot after surgery   Heart attack Paternal Grandmother     Past Surgical History:  Procedure Laterality Date   BREAST LUMPECTOMY Bilateral    CHOLECYSTECTOMY  1999   SPINE SURGERY     Social History   Occupational History   Occupation: Optometrist  Tobacco Use   Smoking status: Never   Smokeless tobacco: Never  Vaping Use   Vaping Use: Never used  Substance and Sexual Activity   Alcohol use: Yes    Alcohol/week: 2.0 standard drinks    Types: 2 Glasses of wine per week   Drug use: No   Sexual activity: Not Currently

## 2021-08-12 NOTE — Addendum Note (Signed)
Addended by: Basil Dess on: 08/12/2021 11:38 AM   Modules accepted: Orders

## 2021-08-17 DIAGNOSIS — H25011 Cortical age-related cataract, right eye: Secondary | ICD-10-CM | POA: Diagnosis not present

## 2021-08-17 DIAGNOSIS — H25811 Combined forms of age-related cataract, right eye: Secondary | ICD-10-CM | POA: Diagnosis not present

## 2021-08-17 DIAGNOSIS — H2511 Age-related nuclear cataract, right eye: Secondary | ICD-10-CM | POA: Diagnosis not present

## 2021-08-19 ENCOUNTER — Ambulatory Visit: Payer: Medicare Other | Admitting: Pharmacist

## 2021-08-19 ENCOUNTER — Other Ambulatory Visit: Payer: Self-pay

## 2021-08-19 DIAGNOSIS — N182 Chronic kidney disease, stage 2 (mild): Secondary | ICD-10-CM

## 2021-08-19 DIAGNOSIS — E782 Mixed hyperlipidemia: Secondary | ICD-10-CM

## 2021-08-19 DIAGNOSIS — Z79899 Other long term (current) drug therapy: Secondary | ICD-10-CM

## 2021-08-19 DIAGNOSIS — I10 Essential (primary) hypertension: Secondary | ICD-10-CM

## 2021-08-19 DIAGNOSIS — F331 Major depressive disorder, recurrent, moderate: Secondary | ICD-10-CM

## 2021-08-19 DIAGNOSIS — N3281 Overactive bladder: Secondary | ICD-10-CM

## 2021-08-19 DIAGNOSIS — F419 Anxiety disorder, unspecified: Secondary | ICD-10-CM

## 2021-08-25 NOTE — Progress Notes (Signed)
Pharmacist Visit  Rebekah Hamilton, Rebekah Hamilton  [id number] years, Female  DOB: 23-Oct-1945  M: [phone] Care Team: Rhys Martini, Khallid Pasillas  __________________________________________________ Clinical Summary Situation:: Rebekah Hamilton is a 76 year old female who presents for initial CCM visit. Patients primary concern is leg weakness and stress from current divorce process. Her blood pressure has been uncontrolled at previous visits Background:: Pt has a history of s/p lumbar fusion in 10/2020 in which she has had leg weakness since then. Pt was referred to physical therapy from orthopedic doctor 08/12/21, and still awaiting call.  Pt has not been checking blood pressure since the surgery. She also states that she had not been taking the HCTZ since the surgery she has not been checking BP at home either. Depression and anxiety have worsened since pt dealing with divorce and health challenges. Stopped CBT due to clinic no longer accepting Medicare Assessment:: Pt BP is uncontrolled and pt would benefit from restarting HCTZ. Recommendations:: Encouraged pt to restart HCTZ. Encouraged pt to check BP at home and CCM team will follow up with pt in 1 month to assess home BP readings. when remote BP monitoring is available, will enroll patient. Encouraged pt to complete CBT therapy, reach out to insurance to discover in-network practices. Attestation Statement:: CCM Services:  This encounter meets complex CCM services and moderate to high medical decision making.  Prior to outreach and patient consent for Chronic Care Management, I referred this patient for services after reviewing the nominated patient list or from a personal encounter with the patient.  I have personally reviewed this encounter including the documentation in this note and have collaborated with the care management provider regarding care management and care coordination activities to include development and update of the comprehensive care  plan I am certifying that I agree with the content of this note and encounter as supervising physician. ____________________________________________________ Patient scheduled for CCM visit with the clinical pharmacist.  Patient is referred for CCM by their PCP and CPP is under general PCP supervision.: At least 2 of these conditions are expected to last 12 months or longer and patient is at significant risk for acute exacerbations and/or functional decline.  Patient has consented to participation in [facility] program. Visit Type: Phone call Date of Upcoming Visit: 08/19/21 at 10:30AM  Chronic Conditions Patient's Chronic Conditions: Hypertension (HTN), Chronic Kidney Disease (CKD), Overactive Bladder (OAB), Hyperlipidemia/Dyslipidemia (HLD), Anxiety, Depression, Glaucoma, Chronic Pain,  Migraines, Chronic Venous Insufficiency, Vitamin D deficiency, Lumbar Stenosis   Doctor and Hospital Visits Were there PCP Visits in last 6 months: Yes Visit #1: 02/19/21- Dr. Melford Aase (PCP)- Pt. presented for removal of sutures. No other information available. Visit #2: 02/22/2021- Dr. Melford Aase- Pt. presented for 9 month f/u. No medication changes noted. Visit #3: 06/08/2021- Dr. Melford Aase- Pt. presented for a comprehensive evaluation and management of multiple medical co-morbidities.  Added-Dexamethasone 4 MG Take 1 tab 3 x day - 3 days, then 2 x day - 3 days, then 1 tab daily for Hip Bursitis  Changed- Gabapentin 100mg  to 1 cap 4x/day  Stopped- Hydrocodone-Acetaminophen 5-325mg  1 tab Q6hrs PRN Were there Specialist Visits in last 6 months?: Yes Visit #1: 02/26/2021-Repass, Wilder Glade, NP ([facility]) - Pt. presented for post-op exam s/p lumbar spinal fusion. No other information available. Visit #2: 04/21/2021- Branch, Jacky Kindle., MD ([facility]) Pt. presented for post-op exam 6-8 week follow up. No other information available. Visit #3: 06/14/2021-Karen Edythe Clarity, NP ([place] [facility])- Pt. presented for  follow up.  Pt. s/p posterior lumbar interbody fusion L3-L4 with revision of fractured rod on 01-29-21. No medication changes noted. Visit #4: 07/15/2021-Nitka, Daleen Bo, MD (Orthopedic Surgery)-Pt. was referred by Dr. Melford Aase and presented for right hip pain. No medication changes noted. Visit #5: 08/12/2021-Nitka, Daleen Bo, MD (Orthopedic Surgery)- Pt. presented for f/u for right hip pain.  Changed- Gabapentin 100mg  1 capsule 4x day at bedtime. Was there a Hospital Visit in last 30 days?: No Were there other Hospital Visits in last 6 months?: No  Medication Information Are there any Medication discrepancies?: No Are there any Medication adherence gaps (beyond 5 days past due)?: No Medication adherence rates for the STAR rating drugs: N/A List Patient's current Care Gaps: Need Colorectal Cancer Screening  Disease Assessments Current BP: 155/93 Current HR: 76 taken on: 08/12/2021 Previous BP: 148/86 Previous HR: 69 taken on: 07/15/2021 Weight: 157 BMI: 27.46 Last GFR: 54 taken on: 06/07/2021 Why did the patient present?: CCM Initial visit Marital status?: Divorced Details: Currently working through the divorce 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 Got a-lot of things going on in the house. In the midst of divorce which has been depressing. no family around except one daughter who lives at the [place] and 1 in [place]. One sibling lives in [place], [place] and the other in [place], [place]. mother lives with her during the summer time and spring time. Who does the patient spend their time with and what do they do?: Patient does not have any family that is nearby. She does have church friends that she tries to hang out with at least 2-3 times a week Lifestyle habits such as diet and exercise?: Diet: Eats 1 major meal a day:  Dinner: veggies and sandwich, garlic bread. Snacks on crackers, English muffin  throughout the day. Likes dessert, ice-cream sandwich every other night Coffee: 1 cup a day sometimes Alcohol, tobacco, and illicit drug usage?: Drink wine a little couple times a week No tobacco or illicit drugs What is the patient's sleep pattern?: No sleep issues How many hours per night does patient typically sleep?: 7-8hours.  Patient pleased with health care they are receiving?: Yes Family, occupational, and living circumstances relevant to overall health?: Mother (Alive) - Hypertension, Stroke  Father (Deceased) - Heart attack (Age: 51)  Maternal Grandmother (Deceased) - blood clot after surgery Paternal Grandmother (Deceased) - Heart attack  Factors that may affect medication adherence?: Altered mental status (dementia, substance use, mental illness) Name and location of Current pharmacy: [facility] or [place], [place] Current Rx insurance plan: Cigna Are meds synced by current pharmacy?: No Are meds delivered by current pharmacy?: No - delivery available but patient prefers to not use Would patient benefit from direct intervention of clinical lead in dispensing process to optimize clinical outcomes?: Yes Are [facility] pharmacy services available where patient lives?: Yes Is patient disadvantaged to use [facility]?: No [facility] services reviewed with patient and patient wishes to change pharmacy?: No Select reason patient declined to change pharmacies: Prefers to go to the pharmacy / reason to leave the house Does patient experience delays in picking up medications due to transportation concerns (getting to pharmacy)?: No Any additional demeanor/mood notes?: Rebekah Hamilton is a 76 year old female who presents for initial CCM visit. Patients primary concern is leg weakness and stress from current  divorce process.  Hypertension (HTN) Assess this condition today?: Yes Is patient able to obtain BP  reading today?: No Goal: <130/80 mmHG Hypertension Stage: Stage 2 (SBP >140 or DBP > 90) Is Patient checking BP at home?: No How often does patient miss taking their blood pressure medications?: Pt has not been taking HTCZ since recent surgery Has patient experienced hypotension, dizziness, falls or bradycardia?: Yes Provide Details: Patient fell a few weeks ago due to leg weakness. She denied additional imaging and feels fine. She is now taking precautions to prevent future falls Check present secondary causes (below) for HTN: CKD BP RPM device: Does patient qualify?: Yes We discussed: DASH diet:  following a diet emphasizing fruits and vegetables and low-fat dairy products along with whole grains, fish, poultry, and nuts. Reducing red meats and sugars., Targeting 150 minutes of aerobic activity per week, Reducing the amount of salt intake to 1500mg /per day. Assessment:: Uncontrolled Drug: HCTZ 25mg  1/2 to 1 tab daily  Assessment: Appropriate, Query Effectiveness Drug: Lopressor 25mg  take 1 tab BID Assessment: Appropriate, Query Effectiveness Additional Info: Pt unable to check BP due to being at work during the visit. Plan to Start: Check BP every other day and write down BP values. Restart HCTZ 12.5mg  daily. Also, call patient in 1 month to assess home BP values. HC Follow up: HTN assessment February. Please provide at home BP reading log for the last 4 weeks. Also ask pt if they restarted the HCTZ Pharmacist Follow up: Assess Home BP readings, Enroll pt in BP RPM when available.  Hyperlipidemia/Dyslipidemia (HLD) Last Lipid panel on: 06/07/2021 TC (Goal<200): 130 LDL: 66 HDL (Goal>40): 45 TG (Goal<150): 106 ASCVD 10-year risk?is:: Intermediate (7.5%-20%) ASCVD Risk Score: 10.8% Assess this condition today?: Yes LDL Goal: <100 Has patient tried and failed any HLD Medications?: No Check present secondary causes (below) that can lead to increased cholesterol levels (multi-choice  optional): Beta blockers, Thiazide diuretics, Hormonal treatments We discussed: How to reduce cholesterol through diet/weight management and physical activity., How a diet high in plant sterols (fruits/vegetables/nuts/whole grains/legumes) may reduce your cholesterol. Assessment:: Controlled Drug: Pravastatin 40mg  QD Assessment: Appropriate, Effective, Safe, Accessible HC Follow up: N/A Pharmacist Follow up: Assess lipid panel, LFTs, s/s rhabdo  Anxiety Previous GAD-7 Score: Unknown  Assess this condition today?: Yes Completing the GAD-7 Questionnaire today?: No In your opinion, how do you feel your anxiety symptoms have been controlled over the past 3 months?: Worsened Patient has tried and failed: Pt states that due to the divorce she has been very stressed and anxious. Pt denies CBT therapy due to the clinic no longer accepting Medicare. Pt is not interested in adjusting blood sugar therapy Assessment:: Uncontrolled Drug: Alprazolam 1/2 to 1 tablet 3 times a day PRN Assessment: Appropriate, Query Effectiveness Drug: Duloxetine 30mg  QD Assessment: Appropriate, Query Effectiveness Plan to Start: Reach out to Medicare to see list of in-network mental health clinics [facility] Follow up: Anxiety/Depression Assessment in March (please also complete GAD-7 and PHQ-9) Pharmacist Follow up: Assess mood at next visit  Depression Assess this condition today?: Yes Completing the PHQ-9 Questionnaire today?: No In your opinion, how do you feel your depression symptoms have been controlled over the past 3 months?: Worsened Patient has tried and failed: CBT previously, but Medicare was no longer accepted at the clinic she was receiving mental health services Assessment:: Uncontrolled Drug: Duloxetine 30mg  QD Assessment: Appropriate, Query Effectiveness HC Follow up: Anxiety/Depression Assessment in March (please also complete GAD-7 and PHQ-9) Pharmacist Follow up: Continue  to assess mood at next  visit  Chronic kidney disease (CKD) Previous GFR: 54 taken on: 06/07/2021 The current microalbumin ratio is: <0.2 tested on: 06/07/2021 Assess this condition today?: Yes CKD Stage: Stage 3a (GFR 45-60 mL/min) Albuminuria Stage: A1 (<30) Contributing factors for developing CKD: HTN Is Patient taking statin medication: Yes Is patient taking ACEi / ARB?: No Renal dose adjustments recommended?: No We discussed: Limiting dietary sodium intake to less than 2000 mg / day, Maintaining blood pressure control, Maintaining blood glucose control, Avoidance of nephrotoxic drugs (NSAIDs) Assessment:: Controlled Drug: None HC Follow up: N/A Pharmacist Follow up: Continue to assess GFR, SCr, adjust meds as necessary  Overactive Bladder (OAB) Assess this condition today?: Yes How often are you waking at night to urinate?: 1 time a night What dietary modifications have you made to help with your overactive bladder?: None We discussed: Timed voiding, Avoiding / limiting caffeine intake, Limiting fluid intake for 2-3 hours prior to bedtime Assessment:: Controlled Drug: None HC Follow up: N/A Pharmacist Follow up: Continue to assess bladder symptoms  Exercise, Diet and Non-Drug Coordination Needs Additional exercise counseling points. We discussed: decreasing sedentary behavior, incorporating flexibility, balance, and strength training exercises Additional diet counseling points. We discussed: aiming to consume at least 8 cups of water day, key components of a low-carb eating plan, key components of the DASH diet Discussed Non-Drug Care Coordination Needs: Yes Does Patient have Medication financial barriers?: No  Accountable Health Communities Health-Related Social Needs Screening Tool -  SDOH  (BloggerBowl.es) What is your living situation today? (ref #1): I have a steady place to live Think about the place you live. Do you have problems  with any of the following? (ref #2): None of the above Within the past 12 months, you worried that your food would run out before you got money to buy more (ref #3): Never true Within the past 12 months, the food you bought just didn't last and you didn't have money to get more (ref #4): Never true In the past 12 months, has lack of reliable transportation kept you from medical appointments, meetings, work or from getting things needed for daily living? (ref #5): No In the past 12 months, has the electric, gas, oil, or water company threatened to shut off services in your home? (ref #6): No How often does anyone, including family and friends, physically hurt you? (ref #7): Never (1) How often does anyone, including family and friends, insult or talk down to you? (ref #8): Never (1) How often does anyone, including friends and family, threaten you with harm? (ref #9): Never (1) How often does anyone, including family and friends, scream or curse at you? (ref #10): Never (1)  Engagement Notes Rebekah Hamilton on 08/19/2021 12:32 PM CPP Chart Review: 26 min CPP Office Visit: 40 min CPP Office Visit Documentation: 65 min CPP Coordination of Care: [facility] Care Plan Completion: 24 minutes  CPP Care Plan Review: 14 min  Engagement Notes Rebekah Hamilton on 08/19/2021 12:35 PM HC F/u: February: HTN assessment (Please record BP readings from past 4 weeks. Also confirm if pt restarted HCTZ) March: Anxiety/Depression assessment (please also complete GAD-7 and PHQ-9)    CPP F/u:  04/19/2022 at 10:30AM 11/15/21: OV w Rebekah Hamilton 04/19/22 @ 10:30am: CCM f/u phone visit (HTN, mental health, has leg weakness resolved, how is pt coping w/ divorce)         Patient Name: Rebekah Hamilton, Rebekah Hamilton DOB:  Dec 24, 1945   Last Care Plan Update: 08/23/2021  COMPREHENSIVE  CARE PLAN AND GOALS   HYPERTENSION  MOST RECENT BLOOD PRESSURE:     155/93 MY GOAL BLOOD PRESSURE:  <130/80 mmHG CURRENT MEDICATION AND DOSING:   Hydrochlorothiazide 25mg  1/2 to 1 tab daily, Lopressor 25mg  take 1 tab Twice daily  THE GOALS WE HAVE CHOSEN ARE:    -Maintain an at goal blood pressure  BARRIERS TO ACHIEVING GOALS:  -Not at goal PLAN TO WORK ON THESE GOALS:  -Take medications regularly. Restart Hydrochlorothiazide daily -Check BP at home at least 3 times a week -Reduce salt intake (< 1500mg / day)  -Diet: DASH diet (Choose fruits, vegetables, and low-fat dairy products. Increase whole grains, fish, poultry, nuts. Reduce red meats and sugars)  -Weight: 1 kg = ~1 mmHg reduction  -Exercise  CHOLESTEROL  MOST RECENT LABS:    06/07/2021 -TOTAL CHOLESTEROL: 130 -TRIGLYCERIDES: 106 -HDL: 45 -LDL: 66 CURRENT MEDICATION AND DOSING:  Pravastatin 40mg  Once daily THE GOALS WE HAVE CHOSEN ARE:    -Total Cholesterol goal under 200, Triglycerides goal under 150, HDL goal above 40, LDL goal under 100  BARRIERS TO ACHIEVING GOALS:  -At goal PLAN TO WORK ON THESE GOALS:  -Take medication regularly  -Diet: high in plant sterols (fruits/ vegetables/ nuts/ whole grains/ legumes). Increase fiber intake (10-25g/day). Avoid foods high in cholesterol (red meat, egg yolks, dairy, oils/ butter). Choose low-fat options.  -Exercise  -Weight Management  Anxiety  CURRENT REGIMEN AND DOSING:  Alprazolam 1/2 to 1 tablet 3 times a day as needed, Duloxetine 30mg  once daily THE GOALS WE HAVE CHOSEN ARE:  -Lower and manage symptoms of anxiety  PLAN TO WORK ON THESE GOALS:    -Continue medication as prescribed  -Inform practitioner of any signs and symptoms of worsening anxiety  -Symptoms: fear, worry, tachycardia, palpitations, SOB, stomach upset, chest pain, insomnia, fatigue  -Lifestyle changes: increasing physical activity, community involvement, yoga and meditation   Depression  CURRENT REGIMEN AND DOSING:  Duloxetine 30mg  once daily THE GOALS WE HAVE CHOSEN ARE:  -Lower and manage symptoms of depression  PLAN TO WORK ON THESE  GOALS:    -Continue medication as prescribed  -Inform practitioner of any signs and symptoms of worsening depression  -Symptoms: persistent feeling of hopelessness, dejection, constant worry, poor concentration, lack of energy, inability to sleep, sometimes suicidal tendencies, agitation, decreased concentration, sleep changes, diminished interest/ pleasure, changes in appetite  Chronic Kidney Disease  Labs:  GFR 54 on 06/07/21  Microalbumin ratio <0.2 on 06/07/21 PLAN TO WORK ON THESE GOALS:    -Maintaining normal BP and BG  -Diet: Decrease salt and fatty foods. Increase fruit and vegetables  Overactive Bladder (OAB) CURRENT REGIMEN AND DOSING:  None THE GOALS WE HAVE CHOSEN ARE:  -Continue to assess bladder symptoms BARRIERS TO ACHIEVING GOALS: At goal PLAN TO WORK ON THESE GOALS:    -Timed voiding -Avoiding / limiting caffeine intake, limiting fluid intake for 2-3 hours prior to bedtime    ACTIVE MEDICATION LIST  Your current medication list has been updated. To view, log in to your patient portal.  Call if any changes need to be made.   MEDICATION REVIEW  MEDICATION REVIEW CONDUCTED:   Yes   DATE:    08/23/2021 BEST POSSIBLE MEDICATION HISTORY  SOURCE:   Medical Records     HOW DO I? - WHEN DO I?   GET AHOLD OF MY DOCTOR?   DURING BUSINESS HOURS WHEN THE OFFICE IS OPEN    PHONE: [phone] AFTER HOURS [facility] NURSE WHEN THE OFFICE  IS CLOSED   PHONE: [phone]  TALK TO [place] CARE COORDINATOR NAME: Rebekah Hamilton  PHONE: [phone] EMAIL:  [email or web address] CARE COORDINATOR STAFF   NAME: Rosary Lively, Marlene Bast, Sandria Bales  PHONE: [phone]  NOTE SECTION  Thank you for participating in the Chronic Care Management (CCM) program with Dr. Newton Hamilton   This program takes a proactive approach to your health and my team will serve as a resource for you throughout the year. Please follow up at [phone] if you have any  questions or experience changes to your overall health. Your next CCM appointment will be conducted with Rebekah Hamilton, PharmD as follows:    Date:  04/19/2022  Time:  10:30AM Over the Phone    Rachelle Hora. Jeannett Senior, PharmD  Clinical Pharmacist  [email or web address]  [phone]

## 2021-09-13 DIAGNOSIS — E782 Mixed hyperlipidemia: Secondary | ICD-10-CM | POA: Diagnosis not present

## 2021-09-13 DIAGNOSIS — N3281 Overactive bladder: Secondary | ICD-10-CM | POA: Diagnosis not present

## 2021-09-13 DIAGNOSIS — I10 Essential (primary) hypertension: Secondary | ICD-10-CM | POA: Diagnosis not present

## 2021-09-13 DIAGNOSIS — N182 Chronic kidney disease, stage 2 (mild): Secondary | ICD-10-CM | POA: Diagnosis not present

## 2021-09-24 DIAGNOSIS — D0439 Carcinoma in situ of skin of other parts of face: Secondary | ICD-10-CM | POA: Diagnosis not present

## 2021-09-24 DIAGNOSIS — D2261 Melanocytic nevi of right upper limb, including shoulder: Secondary | ICD-10-CM | POA: Diagnosis not present

## 2021-09-24 DIAGNOSIS — D2262 Melanocytic nevi of left upper limb, including shoulder: Secondary | ICD-10-CM | POA: Diagnosis not present

## 2021-09-24 DIAGNOSIS — D225 Melanocytic nevi of trunk: Secondary | ICD-10-CM | POA: Diagnosis not present

## 2021-09-24 DIAGNOSIS — L821 Other seborrheic keratosis: Secondary | ICD-10-CM | POA: Diagnosis not present

## 2021-09-24 DIAGNOSIS — D485 Neoplasm of uncertain behavior of skin: Secondary | ICD-10-CM | POA: Diagnosis not present

## 2021-10-05 ENCOUNTER — Ambulatory Visit: Payer: Medicare Other | Attending: Specialist | Admitting: Physical Therapy

## 2021-10-05 ENCOUNTER — Encounter: Payer: Self-pay | Admitting: Physical Therapy

## 2021-10-05 ENCOUNTER — Other Ambulatory Visit: Payer: Self-pay

## 2021-10-05 DIAGNOSIS — M25551 Pain in right hip: Secondary | ICD-10-CM | POA: Diagnosis not present

## 2021-10-05 DIAGNOSIS — M6281 Muscle weakness (generalized): Secondary | ICD-10-CM | POA: Insufficient documentation

## 2021-10-05 DIAGNOSIS — R29898 Other symptoms and signs involving the musculoskeletal system: Secondary | ICD-10-CM | POA: Diagnosis not present

## 2021-10-05 DIAGNOSIS — R2689 Other abnormalities of gait and mobility: Secondary | ICD-10-CM | POA: Insufficient documentation

## 2021-10-05 DIAGNOSIS — R2681 Unsteadiness on feet: Secondary | ICD-10-CM | POA: Insufficient documentation

## 2021-10-05 DIAGNOSIS — R262 Difficulty in walking, not elsewhere classified: Secondary | ICD-10-CM | POA: Insufficient documentation

## 2021-10-05 DIAGNOSIS — R278 Other lack of coordination: Secondary | ICD-10-CM | POA: Insufficient documentation

## 2021-10-06 NOTE — Therapy (Signed)
[place] MAIN [facility] SERVICES [address] [place], [place], [address] Phone: [phone]   Fax:  [phone]  Physical Therapy Evaluation  Patient Details  Name: Rebekah Hamilton MRN: [id number] Date of Birth: 08/31/1945 Referring Provider (PT): Dr. Louanne Skye   Encounter Date: 10/05/2021   PT End of Session - 10/06/21 0904     Visit Number 1    Number of Visits 17    Date for PT Re-Evaluation 12/01/21    Authorization Type Medicare    PT Start Time 0920    PT Stop Time 1020    PT Time Calculation (min) 60 min    Activity Tolerance Patient tolerated treatment well    Behavior During Therapy [facility] for tasks assessed/performed             Past Medical History:  Diagnosis Date   Allergy    Anemia    Anxiety    Depression    Hyperlipidemia    Hypertension    Lumbar stenosis    Migraines    Prediabetes     Past Surgical History:  Procedure Laterality Date   BREAST LUMPECTOMY Bilateral    CHOLECYSTECTOMY  1999   SPINE SURGERY      There were no vitals filed for this visit.    Subjective Assessment - 10/05/21 0927     Subjective "My right hip has really been bothering me. It was fine after the initial spinal fusion and then after the 2nd surgery, my right hip has bothered me ever since."    Pertinent History 76 yo Female reports RLE weakness, imbalance and decreased walking ability. Her PMH is significant:  She is s/p posterior lumbar interbody fusion L3-L4 with revision of fractured rod on 01-29-21 and noted s/p T9-S2 performed on 12-23-19. She reports after 2nd surgery, the surgeon did something to affect the nerve in her right hip; She reports since the surgery she hasn't been back to the surgeon because he is [facility] and that's too far. She has been seeing Dr. Louanne Skye in [place] and she will be following up with him. She reports her pain is intermittent worse with standing/walking and is alleviated with sitting;she  does have a back brace that she will wear as needed (has worn it 1x in last several weeks). She denies any numbness/tingling down RLE. She was given gabapentin but couldn't tolerate it because of memory loss; She also tried steroids which helped a little but it made her jittery; She reports nothing helps at night. She reports frequent sleep disturbances, taking tylenol/excedrin to help manage symptoms; She typically sleeps flat on back, has increased pain in sidelying and is unable to tolerate right sidelying; She reports multiple falls with 2 significant falls in last several months. In addition, she reports significant impairement to memory which she attributes to 2nd surgery and prolonged anesthesia;    Limitations Standing;Walking    How long can you sit comfortably? as long as needed, but increased pain upon standing;    How long can you stand comfortably? 20 min;    How long can you walk comfortably? 5-52min    Patient Stated Goals "Get rid of pain."    Currently in Pain? Yes    Pain Location Hip    Pain Orientation Right;Posterior;Lateral    Pain Descriptors / Indicators Throbbing;Tender    Pain Type Chronic pain    Pain Onset More than a month ago    Pain Frequency Intermittent  Aggravating Factors  worse with walking, will wake up with sleeping; no pain when driving or sitting but has pain when trying to get up;    Pain Relieving Factors no pain in sitting;    Effect of Pain on Daily Activities decreased activity tolerance;    Multiple Pain Sites No                [facility] PT Assessment - 10/06/21 0001       Assessment   Medical Diagnosis right hip pain/weakness    Referring Provider (PT) Dr. Louanne Skye    Onset Date/Surgical Date 02/03/22    Hand Dominance Right    Next MD Visit Next month    Prior Therapy Denies any outpatient PT for this condition, has had therapy after initial fusion and was in rehab after 2nd surgery;      Precautions   Precautions Fall    Required  Braces or Orthoses Spinal Brace   which she wears prn     Restrictions   Weight Bearing Restrictions No      Balance Screen   Has the patient fallen in the past 6 months Yes    How many times? 2    Has the patient had a decrease in activity level because of a fear of falling?  Yes    Is the patient reluctant to leave their home because of a fear of falling?  No      Home Environment   Living Environment Private residence    Living Arrangements Alone    Available Help at Discharge Family    Type of [place] --   split level home   Home Layout Multi-level;Bed/bath upstairs    Alternate Level Stairs-Number of Steps 4 steps with left rail; when going down to lower level railing on right    [place] - 2 wheels;Cane - single point;Shower seat      Prior Function   Level of Independence Independent    Vocation Part time employment    Vocation Requirements none    Leisure used to dance, likes to travel,      Cognition   Overall Cognitive Status Within Functional Limits for tasks assessed      Observation/Other Assessments   Observations patient very pleasant    Focus on Therapeutic Outcomes (FOTO)  47%      Sensation   Light Touch Appears Intact      Coordination   Gross Motor Movements are Fluid and Coordinated Yes    Fine Motor Movements are Fluid and Coordinated Yes      Posture/Postural Control   Posture Comments exhibits erect posture, SI joints are equal in height and prominence;      AROM   Overall AROM Comments BLE AROM is WFL, lumbar ROM is limited due to fusion but functional      Strength   Right Hip Flexion 3-/5    Right Hip ABduction 3-/5    Left Hip Flexion 3+/5      Palpation   Spinal mobility hypomobility throughout, discomfort reported at T8-T9 but no pain radiating down RLE    SI assessment  WFL, equal height, positioning    Palpation comment moderate tightness in right thoracolumar paraspinals, but no pain radiating into  hip; does exhibits moderate tenderness and tightness along right gluteal tendon, moderate tenderness to right trochantor bursa as well as along IT band;      Special Tests  Special Tests Lumbar;Hip Special Tests    Lumbar Tests FABER test;Straight Leg Raise;Slump Test;Prone Knee Bend Test    Hip Special Tests  Hip Scouring      FABER test   findings Positive    Side Right      Prone Knee Bend Test   Findings Negative    Side --   bilaterally; does have tightness in quad     Straight Leg Raise   Findings Positive    Side  Right    Comment 60 degrees      Hip Scouring   Findings Negative    Side Right      Transfers   Comments requires UE to push up from chair due to LE stiffness and pain      Ambulation/Gait   Gait Comments ambulates mod I without AD, does exhibit slightly slower gait speed, reciprocal gait pattern      High Level Balance   High Level Balance Comments SLS 10 sec each LE, increased instability on RLE, able to hold tandem stance 10 sec each LE with increased sway noted                        Objective measurements completed on examination: See above findings.                PT Education - 10/06/21 0903     Education Details plan of care    Person(s) Educated Patient    Methods Explanation    Comprehension Verbalized understanding              PT Short Term Goals - 10/06/21 0919       PT SHORT TERM GOAL #1   Title Patient will be adherent to HEP at least 3x a week to improve functional strength and balance for better safety at home.    Time 4    Period Weeks    Status New    Target Date 11/03/21      PT SHORT TERM GOAL #2   Title Patient will report maximum of 1 sleep disturbance per night for better quality of life and improved sleep tolerance;    Time 4    Period Weeks    Status New    Target Date 11/03/21               PT Long Term Goals - 10/06/21 0922       PT LONG TERM GOAL #1   Title Patient  will increase Functional Gait Assessment score to >20/30 as to reduce fall risk and improve dynamic gait safety with community ambulation.    Time 8    Period Weeks    Status New    Target Date 12/01/21      PT LONG TERM GOAL #2   Title Patient will increase RLE gross strength to 4+/5 as to improve functional strength for independent gait, increased standing tolerance and increased ADL ability.    Time 8    Period Weeks    Status New    Target Date 12/01/21      PT LONG TERM GOAL #3   Title Patient will report a worst pain of 3/10 on VAS in     right hip        to improve tolerance with ADLs and reduced symptoms with activities.    Time 8    Period Weeks    Status New    Target Date  12/01/21      PT LONG TERM GOAL #4   Title Patient will increase six minute walk test distance to >1000 feet without increase in right hip pain for progression to community ambulator and improve gait ability    Time 8    Period Weeks    Status New    Target Date 12/01/21      PT LONG TERM GOAL #5   Title Patient will improve FOTO score by 5% to indicate improved functional mobility with ADLs.    Time 8    Period Weeks    Status New    Target Date 12/01/21                    Plan - 10/06/21 0904     Clinical Impression Statement 76 yo Female s/p spinal fusion in 2021, had revision in June 2022 due to rod breaking. She reports after revision she started having increased right hip pain that has not resolved. She believes the surgeon accidently did something to her nerve. Patient reports chronic right hip pain which is mostly posterior but sometimes radiates laterally. She does have a lot of tenderness along gluteal tendon, greater trochanter and down IT band. She also tested positive for lumbar impingement tests with SLR and FABER. Patient exhibits increased weakness in RLE which could be contributing to imbalance. Patient does have more pain at night when trying to sleep and reports multiple  sleep disturbances which could be contributing to imbalance as well. She also reports some memory deficits which have occurred since surgery; She would benefit from skilled PT intervention to improve LE strength, core strength and reduce hip/back pain while also addressing balance deficits.    Personal Factors and Comorbidities Comorbidity 3+;Past/Current Experience;Time since onset of injury/illness/exacerbation    Comorbidities HTN(variable), pre-diabetic, past spinal fusion and revision, anxiety/depression (somewhat controlled), recent falls    Examination-Activity Limitations Bed Mobility;Locomotion Level;Sleep;Squat;Stairs;Stand;Transfers    Examination-Participation Restrictions Cleaning;Community Activity;Shop;Volunteer;Yard Work    [employer]chant [employer] officer Evolving/Moderate complexity    Clinical Decision Making Moderate    Rehab Potential Fair    PT Frequency 2x / week    PT Duration 8 weeks    PT Treatment/Interventions ADLs/Self Care Home Management;Cryotherapy;Electrical Stimulation;Moist Heat;Gait training;Stair training;Functional mobility training;Therapeutic activities;Therapeutic exercise;Balance training;Neuromuscular re-education;Patient/family education;Manual techniques;Dry needling;Energy conservation;Passive range of motion;Joint Manipulations    PT Next Visit Plan initiate HEP- core stabilization, ROM, LE strengthening    PT Home Exercise Plan will address next session;    Consulted and Agree with Plan of Care Patient             Patient will benefit from skilled therapeutic intervention in order to improve the following deficits and impairments:  Decreased range of motion, Difficulty walking, Decreased activity tolerance, Pain, Hypermobility, Decreased balance, Decreased strength, Decreased mobility  Visit Diagnosis: Pain in right hip  Muscle weakness (generalized)  Difficulty in walking, not elsewhere classified  Unsteadiness on feet     Problem  List Patient Active Problem List   Diagnosis Date Noted   S/P lumbar fusion 11/12/2020   Chronic venous insufficiency 05/07/2019   History of 2019 novel coronavirus disease (COVID-19) 05/07/2019   Chronic left-sided low back pain with left-sided sciatica 07/02/2018   OAB (overactive bladder) 04/02/2018   CKD (chronic kidney disease), symptom management only, stage 2 (mild) 09/24/2017   Encounter for Medicare annual wellness exam 06/02/2015   Glaucoma 06/02/2015   Vitamin D deficiency 10/21/2014   Medication management 12/01/2013  Hyperlipidemia, mixed    Essential hypertension    Abnormal glucose    Migraines    Allergy    Anxiety    Depression, major, recurrent (HCC)    Lumbar stenosis     Rebekah Hamilton, PT, DPT 10/06/2021, 9:25 AM  [place] MAIN [facility] SERVICES [address] [place], [place], [address] Phone: [phone]   Fax:  [phone]  Name: Rebekah Hamilton MRN: [id number] Date of Birth: September 02, 1945

## 2021-10-07 ENCOUNTER — Ambulatory Visit: Payer: Medicare Other | Admitting: Physical Therapy

## 2021-10-07 ENCOUNTER — Encounter: Payer: Self-pay | Admitting: Physical Therapy

## 2021-10-07 ENCOUNTER — Other Ambulatory Visit: Payer: Self-pay

## 2021-10-07 DIAGNOSIS — R29898 Other symptoms and signs involving the musculoskeletal system: Secondary | ICD-10-CM | POA: Diagnosis not present

## 2021-10-07 DIAGNOSIS — R262 Difficulty in walking, not elsewhere classified: Secondary | ICD-10-CM | POA: Diagnosis not present

## 2021-10-07 DIAGNOSIS — R2681 Unsteadiness on feet: Secondary | ICD-10-CM

## 2021-10-07 DIAGNOSIS — M6281 Muscle weakness (generalized): Secondary | ICD-10-CM

## 2021-10-07 DIAGNOSIS — R2689 Other abnormalities of gait and mobility: Secondary | ICD-10-CM | POA: Diagnosis not present

## 2021-10-07 DIAGNOSIS — M25551 Pain in right hip: Secondary | ICD-10-CM

## 2021-10-07 NOTE — Therapy (Signed)
[place] MAIN [facility] SERVICES [address] [place], [place], [address] Phone: [phone]   Fax:  [phone]  Physical Therapy Treatment  Patient Details  Name: Rebekah Hamilton MRN: [id number] Date of Birth: [birth year] Referring Provider (PT): Dr. Louanne Skye   Encounter Date: 10/07/2021   PT End of Session - 10/07/21 1437     Visit Number 2    Number of Visits 17    Date for PT Re-Evaluation 12/01/21    Authorization Type Medicare    PT Start Time 1348    PT Stop Time 1430    PT Time Calculation (min) 42 min    Activity Tolerance Patient tolerated treatment well    Behavior During Therapy [facility] for tasks assessed/performed             Past Medical History:  Diagnosis Date   Allergy    Anemia    Anxiety    Depression    Hyperlipidemia    Hypertension    Lumbar stenosis    Migraines    Prediabetes     Past Surgical History:  Procedure Laterality Date   BREAST LUMPECTOMY Bilateral    CHOLECYSTECTOMY  1999   SPINE SURGERY      There were no vitals filed for this visit.   Subjective Assessment - 10/07/21 1354     Subjective Patient reports her pain is 1-2/10 in right hip; Reports no new changes;    Pertinent History 76 yo Female reports RLE weakness, imbalance and decreased walking ability. Her PMH is significant:  She is s/p posterior lumbar interbody fusion L3-L4 with revision of fractured rod on 01-29-21 and noted s/p T9-S2 performed on 12-23-19. She reports after 2nd surgery, the surgeon did something to affect the nerve in her right hip; She reports since the surgery she hasn't been back to the surgeon because he is [facility] and that's too far. She has been seeing Dr. Louanne Skye in [place] and she will be following up with him. She reports her pain is intermittent worse with standing/walking and is alleviated with sitting;she does have a back brace that she will wear as needed (has worn it 1x in last several weeks). She  denies any numbness/tingling down RLE. She was given gabapentin but couldn't tolerate it because of memory loss; She also tried steroids which helped a little but it made her jittery; She reports nothing helps at night. She reports frequent sleep disturbances, taking tylenol/excedrin to help manage symptoms; She typically sleeps flat on back, has increased pain in sidelying and is unable to tolerate right sidelying; She reports multiple falls with 2 significant falls in last several months. In addition, she reports significant impairement to memory which she attributes to 2nd surgery and prolonged anesthesia;    Limitations Standing;Walking    How long can you sit comfortably? as long as needed, but increased pain upon standing;    How long can you stand comfortably? 20 min;    How long can you walk comfortably? 5-40min    Patient Stated Goals "Get rid of pain."    Currently in Pain? Yes    Pain Score 2     Pain Location Hip    Pain Orientation Right    Pain Descriptors / Indicators Aching;Sore;Throbbing    Pain Type Chronic pain    Pain Onset More than a month ago    Pain Frequency Intermittent    Aggravating Factors  worse with walking    Pain Relieving  Factors no pain in sitting    Effect of Pain on Daily Activities decreased activity tolerance;    Multiple Pain Sites No                 TREATMENT: Hooklying with moist heat to low back concurrent with hooklying exercise: -LE apart, lumbar trunk rotation (windshield wiper) x8 reps each direction with cues to avoid painful ROM -single knee to chest 10 sec hold x1 rep each LE  Progressed to circles clockwise/counterclockwise x5 reps each -single leg crossed over contralateral LE, hip adduction/flexion for IT band/piriformis stretch x5 reps Finished with windshield wiper stretch x5 reps each LE;  Hooklying: Posterior pelvic tilt 5 sec hold x10 reps Posterior pelvic tilt with alternate UE/LE lift (dead bug) x5 reps each with cues  for core stabilization to help reduce back discomfort; patient able to do well with good core stabilization;   Sidelying: Clamshells red tband 2x10 reps RLE only, moderate difficulty;   PT utilized rolling stick to right posterior/lateral hip x5 min to help reduce tightness and reduce delayed onset muscle soreness;  Patient instructed in sitting position Educated patient in sitting posterior pelvic tilt 5 sec hold x5 reps Provided written HEP for better adherence, patient verbalized understanding  She reports increased soreness in right hip at end of session, stating, "I feel like it was worked." She was educated to try exercise daily and to avoid if feeling increased pain;  Also consider TENs unit in future sessions to help manage pain control;                    PT Education - 10/07/21 1437     Education Details HEP/strengthening/ROM    Person(s) Educated Patient    Methods Explanation;Verbal cues    Comprehension Verbalized understanding;Returned demonstration;Verbal cues required;Need further instruction              PT Short Term Goals - 10/06/21 0919       PT SHORT TERM GOAL #1   Title Patient will be adherent to HEP at least 3x a week to improve functional strength and balance for better safety at home.    Time 4    Period Weeks    Status New    Target Date 11/03/21      PT SHORT TERM GOAL #2   Title Patient will report maximum of 1 sleep disturbance per night for better quality of life and improved sleep tolerance;    Time 4    Period Weeks    Status New    Target Date 11/03/21               PT Long Term Goals - 10/06/21 0922       PT LONG TERM GOAL #1   Title Patient will increase Functional Gait Assessment score to >20/30 as to reduce fall risk and improve dynamic gait safety with community ambulation.    Time 8    Period Weeks    Status New    Target Date 12/01/21      PT LONG TERM GOAL #2   Title Patient will increase RLE  gross strength to 4+/5 as to improve functional strength for independent gait, increased standing tolerance and increased ADL ability.    Time 8    Period Weeks    Status New    Target Date 12/01/21      PT LONG TERM GOAL #3   Title Patient will report a worst pain of  3/10 on VAS in     right hip        to improve tolerance with ADLs and reduced symptoms with activities.    Time 8    Period Weeks    Status New    Target Date 12/01/21      PT LONG TERM GOAL #4   Title Patient will increase six minute walk test distance to >1000 feet without increase in right hip pain for progression to community ambulator and improve gait ability    Time 8    Period Weeks    Status New    Target Date 12/01/21      PT LONG TERM GOAL #5   Title Patient will improve FOTO score by 5% to indicate improved functional mobility with ADLs.    Time 8    Period Weeks    Status New    Target Date 12/01/21                   Plan - 10/07/21 1437     Clinical Impression Statement Patient motivated and participated well within session. She was instructed in LE ROM to facilitate better hip mobility. Patient reports mild discomfort during stretches. She was also instructed in advanced core strengthening exercise. she requires min Vcs for proper exercise technique. She was able to activate lower abdominals well with minimal difficulty. She does report increased fatigue with advanced exercise. Patient reports slight increase in RLE hip soreness stating, "I feel like I worked it." at end of session. she was provided with written HEP for better adherence. She would benefit from additional skilled PT Intervention to improve strength, balance and mobility;    Personal Factors and Comorbidities Comorbidity 3+;Past/Current Experience;Time since onset of injury/illness/exacerbation    Comorbidities HTN(variable), pre-diabetic, past spinal fusion and revision, anxiety/depression (somewhat controlled), recent falls     Examination-Activity Limitations Bed Mobility;Locomotion Level;Sleep;Squat;Stairs;Stand;Transfers    Examination-Participation Restrictions Cleaning;Community Activity;Shop;Volunteer;Yard Work    [employer]chant [employer] officer Evolving/Moderate complexity    Rehab Potential Fair    PT Frequency 2x / week    PT Duration 8 weeks    PT Treatment/Interventions ADLs/Self Care Home Management;Cryotherapy;Electrical Stimulation;Moist Heat;Gait training;Stair training;Functional mobility training;Therapeutic activities;Therapeutic exercise;Balance training;Neuromuscular re-education;Patient/family education;Manual techniques;Dry needling;Energy conservation;Passive range of motion;Joint Manipulations    PT Next Visit Plan initiate HEP- core stabilization, ROM, LE strengthening    PT Home Exercise Plan will address next session;    Consulted and Agree with Plan of Care Patient             Patient will benefit from skilled therapeutic intervention in order to improve the following deficits and impairments:  Decreased range of motion, Difficulty walking, Decreased activity tolerance, Pain, Hypermobility, Decreased balance, Decreased strength, Decreased mobility  Visit Diagnosis: Pain in right hip  Muscle weakness (generalized)  Difficulty in walking, not elsewhere classified  Unsteadiness on feet     Problem List Patient Active Problem List   Diagnosis Date Noted   S/P lumbar fusion 11/12/2020   Chronic venous insufficiency 05/07/2019   History of 2019 novel coronavirus disease (COVID-19) 05/07/2019   Chronic left-sided low back pain with left-sided sciatica 07/02/2018   OAB (overactive bladder) 04/02/2018   CKD (chronic kidney disease), symptom management only, stage 2 (mild) 09/24/2017   Encounter for Medicare annual wellness exam 06/02/2015   Glaucoma 06/02/2015   Vitamin D deficiency 10/21/2014   Medication management 12/01/2013   Hyperlipidemia, mixed    Essential  hypertension    Abnormal glucose  Migraines    Allergy    Anxiety    Depression, major, recurrent ([place])    Lumbar stenosis     Travis Mastel, PT, DPT 10/07/2021, 2:39 PM  [place] MAIN [facility] SERVICES [address] [place], [place], [address] Phone: [phone]   Fax:  [phone]  Name: Rebekah Hamilton MRN: [id number] Date of Birth: 06-07-1946

## 2021-10-07 NOTE — Patient Instructions (Signed)
Access Code: [email or web address] URL: [email or web address] Date: 10/07/2021 Prepared by: Blanche East  Exercises Supine Lower Trunk Rotation - 1-2 x daily - 7 x weekly - 1 sets - 10 reps - 2-3 sec hold Supine Single Knee to Chest Stretch - 1 x daily - 7 x weekly - 1 sets - 2 reps - 10 sec hold Sidelying IT Band Stretch - 1 x daily - 7 x weekly - 1 sets - 5 reps Supine Posterior Pelvic Tilt - 1 x daily - 7 x weekly - 1 sets - 10 reps - 5 sec hold Dead Bug - 1 x daily - 7 x weekly - 1 sets - 10 reps Seated Posterior Pelvic Tilt - 1 x daily - 7 x weekly - 1 sets - 10 reps - 5 sec hold

## 2021-10-12 ENCOUNTER — Other Ambulatory Visit: Payer: Self-pay

## 2021-10-12 ENCOUNTER — Ambulatory Visit: Payer: Medicare Other | Admitting: Physical Therapy

## 2021-10-12 ENCOUNTER — Encounter: Payer: Self-pay | Admitting: Physical Therapy

## 2021-10-12 DIAGNOSIS — M25551 Pain in right hip: Secondary | ICD-10-CM

## 2021-10-12 DIAGNOSIS — R2681 Unsteadiness on feet: Secondary | ICD-10-CM | POA: Diagnosis not present

## 2021-10-12 DIAGNOSIS — R262 Difficulty in walking, not elsewhere classified: Secondary | ICD-10-CM

## 2021-10-12 DIAGNOSIS — M6281 Muscle weakness (generalized): Secondary | ICD-10-CM | POA: Diagnosis not present

## 2021-10-12 DIAGNOSIS — R2689 Other abnormalities of gait and mobility: Secondary | ICD-10-CM | POA: Diagnosis not present

## 2021-10-12 DIAGNOSIS — R29898 Other symptoms and signs involving the musculoskeletal system: Secondary | ICD-10-CM | POA: Diagnosis not present

## 2021-10-12 NOTE — Therapy (Signed)
[place] MAIN [facility] SERVICES [address] [place], [place], [address] Phone: [phone]   Fax:  [phone]  Physical Therapy Treatment  Patient Details  Name: Rebekah Hamilton MRN: [id number] Date of Birth: October 23, 1945 Referring Provider (PT): Dr. Louanne Skye   Encounter Date: 10/12/2021   PT End of Session - 10/12/21 1121     Visit Number 3    Number of Visits 17    Date for PT Re-Evaluation 12/01/21    Authorization Type Medicare    PT Start Time 1102    PT Stop Time 1155    PT Time Calculation (min) 53 min    Activity Tolerance Patient tolerated treatment well    Behavior During Therapy [facility] for tasks assessed/performed             Past Medical History:  Diagnosis Date   Allergy    Anemia    Anxiety    Depression    Hyperlipidemia    Hypertension    Lumbar stenosis    Migraines    Prediabetes     Past Surgical History:  Procedure Laterality Date   BREAST LUMPECTOMY Bilateral    CHOLECYSTECTOMY  1999   SPINE SURGERY      There were no vitals filed for this visit.   Subjective Assessment - 10/12/21 1107     Subjective Patient reports having some nose sniffles and has been taking cold medicine. She reports still having intermittent back pain which has been exacerbated with ROM. She reports her pain is not as bad today after taking cold medicine;    Pertinent History 76 yo Female reports RLE weakness, imbalance and decreased walking ability. Her PMH is significant:  She is s/p posterior lumbar interbody fusion L3-L4 with revision of fractured rod on 01-29-21 and noted s/p T9-S2 performed on 12-23-19. She reports after 2nd surgery, the surgeon did something to affect the nerve in her right hip; She reports since the surgery she hasn't been back to the surgeon because he is [facility] and that's too far. She has been seeing Dr. Louanne Skye in [place] and she will be following up with him. She reports her pain is intermittent  worse with standing/walking and is alleviated with sitting;she does have a back brace that she will wear as needed (has worn it 1x in last several weeks). She denies any numbness/tingling down RLE. She was given gabapentin but couldn't tolerate it because of memory loss; She also tried steroids which helped a little but it made her jittery; She reports nothing helps at night. She reports frequent sleep disturbances, taking tylenol/excedrin to help manage symptoms; She typically sleeps flat on back, has increased pain in sidelying and is unable to tolerate right sidelying; She reports multiple falls with 2 significant falls in last several months. In addition, she reports significant impairement to memory which she attributes to 2nd surgery and prolonged anesthesia;    Limitations Standing;Walking    How long can you sit comfortably? as long as needed, but increased pain upon standing;    How long can you stand comfortably? 20 min;    How long can you walk comfortably? 5-65min    Patient Stated Goals "Get rid of pain."    Currently in Pain? Yes    Pain Score 2     Pain Location Hip    Pain Orientation Right    Pain Descriptors / Indicators Aching;Sore    Pain Type Chronic pain    Pain Onset  More than a month ago    Pain Frequency Intermittent    Aggravating Factors  worse with walking/turning over in bed    Pain Relieving Factors no pain in sitting    Effect of Pain on Daily Activities decreased activity tolerance;    Multiple Pain Sites Yes    Pain Score 2    Pain Location Back    Pain Orientation Mid    Pain Descriptors / Indicators Aching;Sore    Pain Type Chronic pain    Pain Onset More than a month ago    Pain Frequency Intermittent                     TREATMENT: PT applied TENs to left side low back and right hip (modulated setting II) at tolerated intensity concurrent with exercise for better tolerance; Hooklying with moist heat to low back concurrent with hooklying  exercise: -LE apart, lumbar trunk rotation (windshield wiper) x10 reps each direction with cues to avoid painful ROM -single knee to chest 20 sec hold x1 rep each LE             Progressed to circles clockwise/counterclockwise x5 reps each -single leg crossed over contralateral LE, hip adduction/flexion for IT band/piriformis stretch x5 reps Finished with windshield wiper stretch x5 reps each LE;   Hooklying: Posterior pelvic tilt 5 sec hold x10 reps Posterior pelvic tilt with BLE leg lift, hip/knee 90 degrees, with mod VCs for core activation and to slow down LE movement to isolate core activation  Bridges x10 reps with cues to avoid painful ROM;    Sidelying: Clamshells red tband 2x10 reps RLE only, moderate difficulty; with moderate soreness in posterior/lateral hip;    PT utilized rolling stick to right posterior/lateral hip x5 min to help reduce tightness and reduce delayed onset muscle soreness;  Patient returned back to hooklying position Lumbar trunk rotation with legs apart (windshield wiper) x5 reps each direction for hip mobility;  Patient transitioned to sitting and then standing She walked around gym x80 feet while assessing pain. She reports minimal right hip discomfort and little to no left lower back pain. She does report her mid left side low back pain which is above TENs unit area.  Educated patient about TENs use and where to find one if interested. She would benefit from additional education on TENs for pain management;    Tolerated session well. Reinforced importance of HEP adherence and to focus on increasing core activation and lumbar flexion to help alleviate symptoms;                     PT Education - 10/12/21 1120     Education Details HEP/positioning, TENS    Person(s) Educated Patient    Methods Explanation;Verbal cues    Comprehension Verbalized understanding;Returned demonstration;Verbal cues required;Need further instruction               PT Short Term Goals - 10/06/21 0919       PT SHORT TERM GOAL #1   Title Patient will be adherent to HEP at least 3x a week to improve functional strength and balance for better safety at home.    Time 4    Period Weeks    Status New    Target Date 11/03/21      PT SHORT TERM GOAL #2   Title Patient will report maximum of 1 sleep disturbance per night for better quality of life and improved sleep tolerance;  Time 4    Period Weeks    Status New    Target Date 11/03/21               PT Long Term Goals - 10/06/21 0922       PT LONG TERM GOAL #1   Title Patient will increase Functional Gait Assessment score to >20/30 as to reduce fall risk and improve dynamic gait safety with community ambulation.    Time 8    Period Weeks    Status New    Target Date 12/01/21      PT LONG TERM GOAL #2   Title Patient will increase RLE gross strength to 4+/5 as to improve functional strength for independent gait, increased standing tolerance and increased ADL ability.    Time 8    Period Weeks    Status New    Target Date 12/01/21      PT LONG TERM GOAL #3   Title Patient will report a worst pain of 3/10 on VAS in     right hip        to improve tolerance with ADLs and reduced symptoms with activities.    Time 8    Period Weeks    Status New    Target Date 12/01/21      PT LONG TERM GOAL #4   Title Patient will increase six minute walk test distance to >1000 feet without increase in right hip pain for progression to community ambulator and improve gait ability    Time 8    Period Weeks    Status New    Target Date 12/01/21      PT LONG TERM GOAL #5   Title Patient will improve FOTO score by 5% to indicate improved functional mobility with ADLs.    Time 8    Period Weeks    Status New    Target Date 12/01/21                   Plan - 10/12/21 1130     Clinical Impression Statement Patient motivated and participated well within session. She was  instructed in advanced core strengthening/lumbar flexion exercise. patient does require min VCS for proper positioning/exercise technique. She denies any increase in back pain with flexion exercise, but does report slight achiness with exercise progression. PT applied TENs throughout session to help with pain relief. Patient tolerated well. She reports less soreness at end of session. Reinforced importance of core strengthening and flexion exercise to help with pain relief with ADLs. She would benefit from additional skilled PT Intervention to improve strength and reduce pain with ADLs.    Personal Factors and Comorbidities Comorbidity 3+;Past/Current Experience;Time since onset of injury/illness/exacerbation    Comorbidities HTN(variable), pre-diabetic, past spinal fusion and revision, anxiety/depression (somewhat controlled), recent falls    Examination-Activity Limitations Bed Mobility;Locomotion Level;Sleep;Squat;Stairs;Stand;Transfers    Examination-Participation Restrictions Cleaning;Community Activity;Shop;Volunteer;Yard Work    [employer] officer Evolving/Moderate complexity    Rehab Potential Fair    PT Frequency 2x / week    PT Duration 8 weeks    PT Treatment/Interventions ADLs/Self Care Home Management;Cryotherapy;Electrical Stimulation;Moist Heat;Gait training;Stair training;Functional mobility training;Therapeutic activities;Therapeutic exercise;Balance training;Neuromuscular re-education;Patient/family education;Manual techniques;Dry needling;Energy conservation;Passive range of motion;Joint Manipulations    PT Next Visit Plan initiate HEP- core stabilization, ROM, LE strengthening    PT Home Exercise Plan will address next session;    Consulted and Agree with Plan of Care Patient  Patient will benefit from skilled therapeutic intervention in order to improve the following deficits and impairments:  Decreased range of motion, Difficulty walking, Decreased  activity tolerance, Pain, Hypermobility, Decreased balance, Decreased strength, Decreased mobility  Visit Diagnosis: Pain in right hip  Muscle weakness (generalized)  Difficulty in walking, not elsewhere classified  Unsteadiness on feet     Problem List Patient Active Problem List   Diagnosis Date Noted   S/P lumbar fusion 11/12/2020   Chronic venous insufficiency 05/07/2019   History of 2019 novel coronavirus disease (COVID-19) 05/07/2019   Chronic left-sided low back pain with left-sided sciatica 07/02/2018   OAB (overactive bladder) 04/02/2018   CKD (chronic kidney disease), symptom management only, stage 2 (mild) 09/24/2017   Encounter for Medicare annual wellness exam 06/02/2015   Glaucoma 06/02/2015   Vitamin D deficiency 10/21/2014   Medication management 12/01/2013   Hyperlipidemia, mixed    Essential hypertension    Abnormal glucose    Migraines    Allergy    Anxiety    Depression, major, recurrent ([place])    Lumbar stenosis     Pearlena Ow, PT, DPT 10/12/2021, 1:31 PM  [place] MAIN [facility] SERVICES [address] [place], [place], [address] Phone: [phone]   Fax:  [phone]  Name: Rebekah Hamilton MRN: [id number] Date of Birth: [birth year]

## 2021-10-14 ENCOUNTER — Ambulatory Visit: Payer: Medicare Other | Attending: Specialist | Admitting: Physical Therapy

## 2021-10-14 ENCOUNTER — Other Ambulatory Visit: Payer: Self-pay

## 2021-10-14 DIAGNOSIS — R2681 Unsteadiness on feet: Secondary | ICD-10-CM | POA: Insufficient documentation

## 2021-10-14 DIAGNOSIS — M25551 Pain in right hip: Secondary | ICD-10-CM | POA: Diagnosis not present

## 2021-10-14 DIAGNOSIS — R262 Difficulty in walking, not elsewhere classified: Secondary | ICD-10-CM | POA: Diagnosis not present

## 2021-10-14 DIAGNOSIS — M6281 Muscle weakness (generalized): Secondary | ICD-10-CM | POA: Insufficient documentation

## 2021-10-14 NOTE — Therapy (Signed)
[place] MAIN [facility] SERVICES [address] [place], [place], [address] Phone: [phone]   Fax:  [phone]  Physical Therapy Treatment  Patient Details  Name: Rebekah Hamilton MRN: [id number] Date of Birth: 03-11-1946 Referring Provider (PT): Dr. Louanne Skye   Encounter Date: 10/14/2021   PT End of Session - 10/14/21 0840     Visit Number 4    Number of Visits 17    Date for PT Re-Evaluation 12/01/21    Authorization Type Medicare    PT Start Time 0848    PT Stop Time 0930    PT Time Calculation (min) 42 min    Activity Tolerance Patient tolerated treatment well    Behavior During Therapy [facility] for tasks assessed/performed             Past Medical History:  Diagnosis Date   Allergy    Anemia    Anxiety    Depression    Hyperlipidemia    Hypertension    Lumbar stenosis    Migraines    Prediabetes     Past Surgical History:  Procedure Laterality Date   BREAST LUMPECTOMY Bilateral    CHOLECYSTECTOMY  1999   SPINE SURGERY      There were no vitals filed for this visit.   Subjective Assessment - 10/14/21 0839     Subjective Patient reports feeling well since previous session and feels physical therapy is already started to improve her lower extremity strength and back pain.  Patient reports difficulty determining whether TENS unit improved her pain during last session but reported did not cause an increase in pain and felt more like a "distraction "from pain.    Pertinent History 76 yo Female reports RLE weakness, imbalance and decreased walking ability. Her PMH is significant:  She is s/p posterior lumbar interbody fusion L3-L4 with revision of fractured rod on 01-29-21 and noted s/p T9-S2 performed on 12-23-19. She reports after 2nd surgery, the surgeon did something to affect the nerve in her right hip; She reports since the surgery she hasn't been back to the surgeon because he is [facility] and that's too far. She has  been seeing Dr. Louanne Skye in [place] and she will be following up with him. She reports her pain is intermittent worse with standing/walking and is alleviated with sitting;she does have a back brace that she will wear as needed (has worn it 1x in last several weeks). She denies any numbness/tingling down RLE. She was given gabapentin but couldn't tolerate it because of memory loss; She also tried steroids which helped a little but it made her jittery; She reports nothing helps at night. She reports frequent sleep disturbances, taking tylenol/excedrin to help manage symptoms; She typically sleeps flat on back, has increased pain in sidelying and is unable to tolerate right sidelying; She reports multiple falls with 2 significant falls in last several months. In addition, she reports significant impairement to memory which she attributes to 2nd surgery and prolonged anesthesia;    Limitations Standing;Walking    How long can you sit comfortably? as long as needed, but increased pain upon standing;    How long can you stand comfortably? 20 min;    How long can you walk comfortably? 5-69min    Patient Stated Goals "Get rid of pain."    Pain Onset More than a month ago    Pain Onset More than a month ago  TREATMENT: Hooklying with moist heat to low back concurrent with hooklying exercise -LE apart, lumbar trunk rotation (windshield wiper) x10 reps each direction with cues to avoid painful ROM -single knee to chest 30 sec hold x1 rep each LE             - circles clock and counterclockwise x 5 ea  Figure 4 piriformis stretch 2 x 45 sec ea, cues for positioning ea -Finished with windshield wiper stretch x5 reps each LE;   Hooklying: Posterior pelvic tilt 5 sec hold x10 reps Posterior pelvic tilt with BLE leg lift, hip/knee 90 degrees alternating 2 x 10 ( 5 ea side)  - pt reports this positioning feels good on low back and improved her pain.  Bridges 2 x10 reps , no pain noted, rated  moderate intensity    Sidelying:  Clamshells red tband 2x10 reps RLE only, moderate difficulty; with moderate soreness in posterior/lateral hip    PT utilized rolling stick to right posterior/lateral hip as well as TF for multiple min to help reduce tightness. Also preformed CFM to IT band as pad had significant discomfort in this area.   Sanding: TFL/ ITB stretch - with L LE crossed over right with left lateral trunk lean and reaching overhead with R UE. X 40 sec, pt felt good stretch in target area.   Pt educated throughout session about proper posture and technique with exercises. Improved exercise technique, movement at target joints, use of target muscles after min to mod verbal, visual, tactile cues.  Note: Portions of this document were prepared using Dragon voice recognition software and although reviewed may contain unintentional dictation errors in syntax, grammar, or spelling.                                  PT Education - 10/14/21 0840     Education Details Exercise form and technique    Person(s) Educated Patient    Methods Explanation;Demonstration    Comprehension Verbalized understanding;Returned demonstration              PT Short Term Goals - 10/06/21 0919       PT SHORT TERM GOAL #1   Title Patient will be adherent to HEP at least 3x a week to improve functional strength and balance for better safety at home.    Time 4    Period Weeks    Status New    Target Date 11/03/21      PT SHORT TERM GOAL #2   Title Patient will report maximum of 1 sleep disturbance per night for better quality of life and improved sleep tolerance;    Time 4    Period Weeks    Status New    Target Date 11/03/21               PT Long Term Goals - 10/06/21 0922       PT LONG TERM GOAL #1   Title Patient will increase Functional Gait Assessment score to >20/30 as to reduce fall risk and improve dynamic gait safety with community ambulation.     Time 8    Period Weeks    Status New    Target Date 12/01/21      PT LONG TERM GOAL #2   Title Patient will increase RLE gross strength to 4+/5 as to improve functional strength for independent gait, increased standing tolerance and increased ADL ability.  Time 8    Period Weeks    Status New    Target Date 12/01/21      PT LONG TERM GOAL #3   Title Patient will report a worst pain of 3/10 on VAS in     right hip        to improve tolerance with ADLs and reduced symptoms with activities.    Time 8    Period Weeks    Status New    Target Date 12/01/21      PT LONG TERM GOAL #4   Title Patient will increase six minute walk test distance to >1000 feet without increase in right hip pain for progression to community ambulator and improve gait ability    Time 8    Period Weeks    Status New    Target Date 12/01/21      PT LONG TERM GOAL #5   Title Patient will improve FOTO score by 5% to indicate improved functional mobility with ADLs.    Time 8    Period Weeks    Status New    Target Date 12/01/21                   Plan - 10/14/21 0840     Clinical Impression Statement Patient demonstrates excellent motivation for completion of physical therapy program.  Patient reports she has felt improvement in her lower extremity strength since beginning physical therapy.  Patient did not report any increase in pain with any exercise but did have increased tightness on the right hip compared with the left.  Patient continue with progression of therapeutic interventions from previous sessions and also incorporated TFL and IT band stretching as patient had discomfort in this area with palpation during manual therapy.  Patient will continue to benefit from skilled physical therapy intervention in order to improve to the strength, reduce pain, and improve her overall function.    Personal Factors and Comorbidities Comorbidity 3+;Past/Current Experience;Time since onset of  injury/illness/exacerbation    Comorbidities HTN(variable), pre-diabetic, past spinal fusion and revision, anxiety/depression (somewhat controlled), recent falls    Examination-Activity Limitations Bed Mobility;Locomotion Level;Sleep;Squat;Stairs;Stand;Transfers    Examination-Participation Restrictions Cleaning;Community Activity;Shop;Volunteer;Yard Work    [employer] officer Evolving/Moderate complexity    Rehab Potential Fair    PT Frequency 2x / week    PT Duration 8 weeks    PT Treatment/Interventions ADLs/Self Care Home Management;Cryotherapy;Electrical Stimulation;Moist Heat;Gait training;Stair training;Functional mobility training;Therapeutic activities;Therapeutic exercise;Balance training;Neuromuscular re-education;Patient/family education;Manual techniques;Dry needling;Energy conservation;Passive range of motion;Joint Manipulations    PT Next Visit Plan initiate HEP- core stabilization, ROM, LE strengthening    PT Home Exercise Plan will address next session;    Consulted and Agree with Plan of Care Patient             Patient will benefit from skilled therapeutic intervention in order to improve the following deficits and impairments:  Decreased range of motion, Difficulty walking, Decreased activity tolerance, Pain, Hypermobility, Decreased balance, Decreased strength, Decreased mobility  Visit Diagnosis: Difficulty in walking, not elsewhere classified  Unsteadiness on feet     Problem List Patient Active Problem List   Diagnosis Date Noted   S/P lumbar fusion 11/12/2020   Chronic venous insufficiency 05/07/2019   History of 2019 novel coronavirus disease (COVID-19) 05/07/2019   Chronic left-sided low back pain with left-sided sciatica 07/02/2018   OAB (overactive bladder) 04/02/2018   CKD (chronic kidney disease), symptom management only, stage 2 (mild) 09/24/2017   Encounter for Medicare  annual wellness exam 06/02/2015   Glaucoma 06/02/2015    Vitamin D deficiency 10/21/2014   Medication management 12/01/2013   Hyperlipidemia, mixed    Essential hypertension    Abnormal glucose    Migraines    Allergy    Anxiety    Depression, major, recurrent ([place])    Lumbar stenosis     Particia Lather, PT 10/14/2021, 9:55 AM  [place] MAIN [facility] SERVICES [address] [place], [place], [address] Phone: [phone]   Fax:  [phone]  Name: Rhian Asebedo MRN: [id number] Date of Birth: 1945-12-31

## 2021-10-19 ENCOUNTER — Other Ambulatory Visit: Payer: Self-pay

## 2021-10-19 ENCOUNTER — Ambulatory Visit: Payer: Medicare Other | Admitting: Physical Therapy

## 2021-10-19 ENCOUNTER — Encounter: Payer: Self-pay | Admitting: Physical Therapy

## 2021-10-19 DIAGNOSIS — M6281 Muscle weakness (generalized): Secondary | ICD-10-CM | POA: Diagnosis not present

## 2021-10-19 DIAGNOSIS — M25551 Pain in right hip: Secondary | ICD-10-CM | POA: Diagnosis not present

## 2021-10-19 DIAGNOSIS — R2681 Unsteadiness on feet: Secondary | ICD-10-CM | POA: Diagnosis not present

## 2021-10-19 DIAGNOSIS — R262 Difficulty in walking, not elsewhere classified: Secondary | ICD-10-CM | POA: Diagnosis not present

## 2021-10-19 NOTE — Therapy (Signed)
[place] MAIN [facility] SERVICES [address] [place], [place], [address] Phone: [phone]   Fax:  [phone]  Physical Therapy Treatment  Patient Details  Name: Rebekah Hamilton MRN: [id number] Date of Birth: [birth year] Referring Provider (PT): Dr. Louanne Skye   Encounter Date: 10/19/2021   PT End of Session - 10/19/21 0907     Visit Number 5    Number of Visits 17    Date for PT Re-Evaluation 12/01/21    Authorization Type Medicare    PT Start Time 0850    PT Stop Time 0930    PT Time Calculation (min) 40 min    Activity Tolerance Patient tolerated treatment well    Behavior During Therapy [facility] for tasks assessed/performed             Past Medical History:  Diagnosis Date   Allergy    Anemia    Anxiety    Depression    Hyperlipidemia    Hypertension    Lumbar stenosis    Migraines    Prediabetes     Past Surgical History:  Procedure Laterality Date   BREAST LUMPECTOMY Bilateral    CHOLECYSTECTOMY  1999   SPINE SURGERY      There were no vitals filed for this visit.   Subjective Assessment - 10/19/21 0904     Subjective Patient reports feeling like her right hip may be a little better. She reports increased soreness along left lateral low back. She isn't sure if the TENs unit helped but states, "I would like to try it again to see if it makes a difference."    Pertinent History 76 yo Female reports RLE weakness, imbalance and decreased walking ability. Her PMH is significant:  She is s/p posterior lumbar interbody fusion L3-L4 with revision of fractured rod on 01-29-21 and noted s/p T9-S2 performed on 12-23-19. She reports after 2nd surgery, the surgeon did something to affect the nerve in her right hip; She reports since the surgery she hasn't been back to the surgeon because he is [facility] and that's too far. She has been seeing Dr. Louanne Skye in [place] and she will be following up with him. She reports her pain is  intermittent worse with standing/walking and is alleviated with sitting;she does have a back brace that she will wear as needed (has worn it 1x in last several weeks). She denies any numbness/tingling down RLE. She was given gabapentin but couldn't tolerate it because of memory loss; She also tried steroids which helped a little but it made her jittery; She reports nothing helps at night. She reports frequent sleep disturbances, taking tylenol/excedrin to help manage symptoms; She typically sleeps flat on back, has increased pain in sidelying and is unable to tolerate right sidelying; She reports multiple falls with 2 significant falls in last several months. In addition, she reports significant impairement to memory which she attributes to 2nd surgery and prolonged anesthesia;    Limitations Standing;Walking    How long can you sit comfortably? as long as needed, but increased pain upon standing;    How long can you stand comfortably? 20 min;    How long can you walk comfortably? 5-58mn    Patient Stated Goals "Get rid of pain."    Currently in Pain? Yes    Pain Score 3     Pain Location Back    Pain Orientation Left;Lower    Pain Descriptors / Indicators Aching;Sore;Tightness    Pain Type  Chronic pain    Pain Onset More than a month ago    Pain Frequency Intermittent    Aggravating Factors  worse with walking/turning over in bed    Pain Relieving Factors no pain in sitting    Effect of Pain on Daily Activities decreased activity tolerance;    Multiple Pain Sites No    Pain Onset More than a month ago                       TREATMENT: PT applied TENs to left side low back (modulated setting II) at tolerated intensity concurrent with exercise for better tolerance; Hooklying with moist heat to low back concurrent with hooklying exercise: -LE apart, lumbar trunk rotation (windshield wiper) x10 reps each direction with cues to avoid painful ROM -single knee to chest 20 sec hold x1  rep each LE             Progressed to circles clockwise/counterclockwise x5 reps each -single leg crossed over contralateral LE, hip adduction/flexion for IT band/piriformis stretch x5 reps Finished with windshield wiper stretch x5 reps each LE;   Hooklying: Posterior pelvic tilt 5 sec hold x5 reps Posterior pelvic tilt with BLE leg lift, hip/knee 90 degrees, with mod VCs for core activation and to slow down LE movement to isolate core activation 5 sec hold x5 reps; Patient reports moderate difficulty but no increase in pain ; Bridges x10 reps with cues to avoid painful ROM;  SLR hip flexion with cues for core stabilization x10 reps each LE;    Sidelying: Clamshells red tband 2x10 reps RLE only, moderate difficulty; with moderate soreness in posterior/lateral hip;    PT utilized rolling stick to right posterior/lateral hip x5 min to help reduce tightness and reduce delayed onset muscle soreness;   Patient returned back to hooklying position Lumbar trunk rotation with legs apart (windshield wiper) x5 reps each direction for hip mobility;   Patient transitioned to sitting and then standing Instructed patient in standing IT band stretch 20 sec hold x1 rep Instructed patient in seated RLE IT band stretch with knee towards opposite shoulder 20 sec hold x1 rep  Advanced HEP- see patient instructions;   Tolerated session well. Reinforced importance of HEP adherence and to focus on increasing core activation and lumbar flexion to help alleviate symptoms; Patient reports less back pain at end of session.                          PT Education - 10/19/21 0907     Education Details exercise technique/positioning;    Person(s) Educated Patient    Methods Explanation;Verbal cues    Comprehension Verbalized understanding;Returned demonstration;Verbal cues required;Need further instruction              PT Short Term Goals - 10/06/21 0919       PT SHORT TERM GOAL #1    Title Patient will be adherent to HEP at least 3x a week to improve functional strength and balance for better safety at home.    Time 4    Period Weeks    Status New    Target Date 11/03/21      PT SHORT TERM GOAL #2   Title Patient will report maximum of 1 sleep disturbance per night for better quality of life and improved sleep tolerance;    Time 4    Period Weeks    Status New  Target Date 11/03/21               PT Long Term Goals - 10/06/21 0922       PT LONG TERM GOAL #1   Title Patient will increase Functional Gait Assessment score to >20/30 as to reduce fall risk and improve dynamic gait safety with community ambulation.    Time 8    Period Weeks    Status New    Target Date 12/01/21      PT LONG TERM GOAL #2   Title Patient will increase RLE gross strength to 4+/5 as to improve functional strength for independent gait, increased standing tolerance and increased ADL ability.    Time 8    Period Weeks    Status New    Target Date 12/01/21      PT LONG TERM GOAL #3   Title Patient will report a worst pain of 3/10 on VAS in     right hip        to improve tolerance with ADLs and reduced symptoms with activities.    Time 8    Period Weeks    Status New    Target Date 12/01/21      PT LONG TERM GOAL #4   Title Patient will increase six minute walk test distance to >1000 feet without increase in right hip pain for progression to community ambulator and improve gait ability    Time 8    Period Weeks    Status New    Target Date 12/01/21      PT LONG TERM GOAL #5   Title Patient will improve FOTO score by 5% to indicate improved functional mobility with ADLs.    Time 8    Period Weeks    Status New    Target Date 12/01/21                   Plan - 10/19/21 1112     Clinical Impression Statement Patient motivated and participated well within session. She reports less low back pain and stiffness at end of session. she was instructed in advanced LE  strengthening while focusing on core stabilization to alleviate back symptoms. Patient does require min Vcs for proper positioning/exercise technique. She does report increased difficulty when strengtheing RLE compared to LLE. She continues to have soreness along RLE IT band which was mildly alleviated with stretches and rolling stick. She would benefit from additional skilled PT Intervention to improve strength, ROM and reduce pain with ADLs.    Personal Factors and Comorbidities Comorbidity 3+;Past/Current Experience;Time since onset of injury/illness/exacerbation    Comorbidities HTN(variable), pre-diabetic, past spinal fusion and revision, anxiety/depression (somewhat controlled), recent falls    Examination-Activity Limitations Bed Mobility;Locomotion Level;Sleep;Squat;Stairs;Stand;Transfers    Examination-Participation Restrictions Cleaning;Community Activity;Shop;Volunteer;Yard Work    [employer] officer Evolving/Moderate complexity    Rehab Potential Fair    PT Frequency 2x / week    PT Duration 8 weeks    PT Treatment/Interventions ADLs/Self Care Home Management;Cryotherapy;Electrical Stimulation;Moist Heat;Gait training;Stair training;Functional mobility training;Therapeutic activities;Therapeutic exercise;Balance training;Neuromuscular re-education;Patient/family education;Manual techniques;Dry needling;Energy conservation;Passive range of motion;Joint Manipulations    PT Next Visit Plan initiate HEP- core stabilization, ROM, LE strengthening    PT Home Exercise Plan will address next session;    Consulted and Agree with Plan of Care Patient             Patient will benefit from skilled therapeutic intervention in order to improve the following deficits and impairments:  Decreased range of motion, Difficulty walking, Decreased activity tolerance, Pain, Hypermobility, Decreased balance, Decreased strength, Decreased mobility  Visit Diagnosis: Difficulty in walking,  not elsewhere classified  Unsteadiness on feet  Pain in right hip  Muscle weakness (generalized)     Problem List Patient Active Problem List   Diagnosis Date Noted   S/P lumbar fusion 11/12/2020   Chronic venous insufficiency 05/07/2019   History of 2019 novel coronavirus disease (COVID-19) 05/07/2019   Chronic left-sided low back pain with left-sided sciatica 07/02/2018   OAB (overactive bladder) 04/02/2018   CKD (chronic kidney disease), symptom management only, stage 2 (mild) 09/24/2017   Encounter for Medicare annual wellness exam 06/02/2015   Glaucoma 06/02/2015   Vitamin D deficiency 10/21/2014   Medication management 12/01/2013   Hyperlipidemia, mixed    Essential hypertension    Abnormal glucose    Migraines    Allergy    Anxiety    Depression, major, recurrent ([place])    Lumbar stenosis     Araly Kaas, PT, DPT 10/19/2021, 11:15 AM  [place], [place], [address] Phone: [phone]   Fax:  [phone]  Name: Rebekah Hamilton MRN: [id number] Date of Birth: 06/08/1946

## 2021-10-19 NOTE — Patient Instructions (Signed)
Access Code: [email or web address] ?URL: [email or web address] ?Date: 10/19/2021 ?Prepared by: Blanche East ? ?Exercises ?Supine Active Straight Leg Raise - 1 x daily - 4 x weekly - 2 sets - 10 reps ?Clamshell with Resistance - 1 x daily - 4 x weekly - 2 sets - 10 reps ?Standing ITB Stretch - 1-2 x daily - 7 x weekly - 1 sets - 2-3 reps - 15 sec hold ?Cross-Legged IT Band Stretch - 1-2 x daily - 7 x weekly - 1 sets - 2-3 reps - 15 sec hold ? ?

## 2021-10-21 ENCOUNTER — Ambulatory Visit: Payer: Medicare Other | Admitting: Physical Therapy

## 2021-10-21 ENCOUNTER — Other Ambulatory Visit: Payer: Self-pay

## 2021-10-21 DIAGNOSIS — R2681 Unsteadiness on feet: Secondary | ICD-10-CM

## 2021-10-21 DIAGNOSIS — R262 Difficulty in walking, not elsewhere classified: Secondary | ICD-10-CM | POA: Diagnosis not present

## 2021-10-21 DIAGNOSIS — M6281 Muscle weakness (generalized): Secondary | ICD-10-CM

## 2021-10-21 DIAGNOSIS — M25551 Pain in right hip: Secondary | ICD-10-CM | POA: Diagnosis not present

## 2021-10-21 NOTE — Patient Instructions (Signed)
Access Code: [email or web address] ?URL: [email or web address] ?Date: 10/21/2021 ?Prepared by: Rivka Barbara ? ?Exercises ?Supine ITB Stretch - 1 x daily - 7 x weekly - 3 sets - 45 sec hold ? ?

## 2021-10-21 NOTE — Therapy (Signed)
[place] MAIN [facility] SERVICES [address] [place], [place], [address] Phone: [phone]   Fax:  [phone]  Physical Therapy Treatment  Patient Details  Name: Rebekah Hamilton MRN: [id number] Date of Birth: 1946/01/19 Referring Provider (PT): Dr. Louanne Skye   Encounter Date: 10/21/2021   PT End of Session - 10/21/21 0858     Visit Number 6    Number of Visits 17    Date for PT Re-Evaluation 12/01/21    Authorization Type Medicare    PT Start Time 0847    PT Stop Time 0930    PT Time Calculation (min) 43 min    Activity Tolerance Patient tolerated treatment well    Behavior During Therapy [facility] for tasks assessed/performed             Past Medical History:  Diagnosis Date   Allergy    Anemia    Anxiety    Depression    Hyperlipidemia    Hypertension    Lumbar stenosis    Migraines    Prediabetes     Past Surgical History:  Procedure Laterality Date   BREAST LUMPECTOMY Bilateral    CHOLECYSTECTOMY  1999   SPINE SURGERY      There were no vitals filed for this visit.   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    Pertinent History 76 yo Female reports RLE weakness, imbalance and decreased walking ability. Her PMH is significant:  She is s/p posterior lumbar interbody fusion L3-L4 with revision of fractured rod on 01-29-21 and noted s/p T9-S2 performed on 12-23-19. She reports after 2nd surgery, the surgeon did something to affect the nerve in her right hip; She reports since the surgery she hasn't been back to the surgeon because he is [facility] and that's too far. She has been seeing Dr. Louanne Skye in [place] and she will be following up with him. She reports her pain is  intermittent worse with standing/walking and is alleviated with sitting;she does have a back brace that she will wear as needed (has worn it 1x in last several weeks). She denies any numbness/tingling down RLE. She was given gabapentin but couldn't tolerate it because of memory loss; She also tried steroids which helped a little but it made her jittery; She reports nothing helps at night. She reports frequent sleep disturbances, taking tylenol/excedrin to help manage symptoms; She typically sleeps flat on back, has increased pain in sidelying and is unable to tolerate right sidelying; She reports multiple falls with 2 significant falls in last several months. In addition, she reports significant impairement to memory which she attributes to 2nd surgery and prolonged anesthesia;    Limitations Standing;Walking    How long can you sit comfortably? as long as needed, but increased pain upon standing;    How long can you stand comfortably? 20 min;    How long can you walk comfortably? 5-30mn    Patient Stated Goals "Get rid of pain."    Pain Onset More than a month ago    Pain Onset More than a month ago                  TREATMENT: Hooklying with moist  heat to low back concurrent with hooklying exercise -LE apart, lumbar trunk rotation (windshield wiper) x10 reps x 10 second holds on each side  each direction with cues to avoid painful ROM Manual A with following stretches:  single knee to chest 30 sec hold x2 rep each LE             - circles clock and counterclockwise x 5 ea   -some pain with clockwise on the L, cues to avoid painful ranges  Figure 4 piriformis stretch 2 x 45 sec ea    Hooklying: Posterior pelvic tilt 5 sec hold x5 reps Posterior pelvic tilt with BLE leg lift with arms in flexion ( no UE support on mat table) , hip/knee 90 degrees alternating 2 x 10 ( 5 ea side)  - pt reports this positioning feels good on low back and improved her pain.  Bridges 2 x10 reps , no pain  noted, rated moderate intensity    Sidelying:  Clamshells red tband 2x10 reps RLE only, moderate difficulty   PT utilized rolling stick to right posterior/lateral hip and TFL/ ITB for multiple min to help reduce tightness. Also preformed CFM to IT band as pad had significant discomfort in this area.  -Significant improvement in IT band discomfort following manual therapy  Seated: TFL/ ITB stretch instructed pt in the following exercise Access Code: [email or web address] URL: [email or web address]  Exercises Supine ITB Stretch - 1 x daily - 7 x weekly - 3 sets - 45 sec hold   Pt educated throughout session about proper posture and technique with exercises. Improved exercise technique, movement at target joints, use of target muscles after min to mod verbal, visual, tactile cues.  Note: Portions of this document were prepared using Dragon voice recognition software and although reviewed may contain unintentional dictation errors in syntax, grammar, or spelling.                                 PT Education - 10/21/21 1228     Education Details exercise form and technique    Person(s) Educated Patient    Methods Explanation    Comprehension Verbalized understanding              PT Short Term Goals - 10/06/21 0919       PT SHORT TERM GOAL #1   Title Patient will be adherent to HEP at least 3x a week to improve functional strength and balance for better safety at home.    Time 4    Period Weeks    Status New    Target Date 11/03/21      PT SHORT TERM GOAL #2   Title Patient will report maximum of 1 sleep disturbance per night for better quality of life and improved sleep tolerance;    Time 4    Period Weeks    Status New    Target Date 11/03/21               PT Long Term Goals - 10/06/21 0922       PT LONG TERM GOAL #1   Title Patient will increase Functional Gait Assessment score to >20/30 as to reduce fall risk and improve  dynamic gait safety with community ambulation.    Time 8    Period Weeks    Status New    Target Date 12/01/21      PT LONG TERM GOAL #2  Title Patient will increase RLE gross strength to 4+/5 as to improve functional strength for independent gait, increased standing tolerance and increased ADL ability.    Time 8    Period Weeks    Status New    Target Date 12/01/21      PT LONG TERM GOAL #3   Title Patient will report a worst pain of 3/10 on VAS in     right hip        to improve tolerance with ADLs and reduced symptoms with activities.    Time 8    Period Weeks    Status New    Target Date 12/01/21      PT LONG TERM GOAL #4   Title Patient will increase six minute walk test distance to >1000 feet without increase in right hip pain for progression to community ambulator and improve gait ability    Time 8    Period Weeks    Status New    Target Date 12/01/21      PT LONG TERM GOAL #5   Title Patient will improve FOTO score by 5% to indicate improved functional mobility with ADLs.    Time 8    Period Weeks    Status New    Target Date 12/01/21                   Plan - 10/21/21 0859     Clinical Impression Statement Patient demonstrates good motivation for completion of physical therapy program.  Patient reports increased pain and discomfort yesterday following physical therapy on Tuesday as well as increase in activity levels with her caretaking responsibilities.  Patient did not report increase in pain with any exercises performed today and reported improved signs and symptoms in her right lateral lower extremity along her IT band following manual therapy at this session.  Patient provided with new home exercise to stretch this area and improve residual tightness.  Patient will continue to benefit from skilled physical therapy intervention in order to improve her lower extremity strength, range of motion, and improve pain and quality of life.    Personal Factors and  Comorbidities Comorbidity 3+;Past/Current Experience;Time since onset of injury/illness/exacerbation    Comorbidities HTN(variable), pre-diabetic, past spinal fusion and revision, anxiety/depression (somewhat controlled), recent falls    Examination-Activity Limitations Bed Mobility;Locomotion Level;Sleep;Squat;Stairs;Stand;Transfers    Examination-Participation Restrictions Cleaning;Community Activity;Shop;Volunteer;Yard Work    [employer]chant [employer] officer Evolving/Moderate complexity    Rehab Potential Fair    PT Frequency 2x / week    PT Duration 8 weeks    PT Treatment/Interventions ADLs/Self Care Home Management;Cryotherapy;Electrical Stimulation;Moist Heat;Gait training;Stair training;Functional mobility training;Therapeutic activities;Therapeutic exercise;Balance training;Neuromuscular re-education;Patient/family education;Manual techniques;Dry needling;Energy conservation;Passive range of motion;Joint Manipulations    PT Next Visit Plan initiate HEP- core stabilization, ROM, LE strengthening    PT Home Exercise Plan will address next session;    Consulted and Agree with Plan of Care Patient             Patient will benefit from skilled therapeutic intervention in order to improve the following deficits and impairments:  Decreased range of motion, Difficulty walking, Decreased activity tolerance, Pain, Hypermobility, Decreased balance, Decreased strength, Decreased mobility  Visit Diagnosis: Difficulty in walking, not elsewhere classified  Unsteadiness on feet  Pain in right hip  Muscle weakness (generalized)     Problem List Patient Active Problem List   Diagnosis Date Noted   S/P lumbar fusion 11/12/2020   Chronic venous insufficiency 05/07/2019   History  of 2019 novel coronavirus disease (COVID-19) 05/07/2019   Chronic left-sided low back pain with left-sided sciatica 07/02/2018   OAB (overactive bladder) 04/02/2018   CKD (chronic kidney disease), symptom  management only, stage 2 (mild) 09/24/2017   Encounter for Medicare annual wellness exam 06/02/2015   Glaucoma 06/02/2015   Vitamin D deficiency 10/21/2014   Medication management 12/01/2013   Hyperlipidemia, mixed    Essential hypertension    Abnormal glucose    Migraines    Allergy    Anxiety    Depression, major, recurrent ([place])    Lumbar stenosis     Particia Lather, PT 10/21/2021, 12:42 PM  [place] MAIN [facility] SERVICES [address] [place], [place], [address] Phone: [phone]   Fax:  [phone]  Name: Ladina Shutters MRN: [id number] Date of Birth: 07-Jan-1946

## 2021-10-26 ENCOUNTER — Ambulatory Visit: Payer: Medicare Other | Admitting: Physical Therapy

## 2021-10-26 ENCOUNTER — Other Ambulatory Visit: Payer: Self-pay

## 2021-10-26 ENCOUNTER — Encounter: Payer: Self-pay | Admitting: Physical Therapy

## 2021-10-26 DIAGNOSIS — R262 Difficulty in walking, not elsewhere classified: Secondary | ICD-10-CM | POA: Diagnosis not present

## 2021-10-26 DIAGNOSIS — M6281 Muscle weakness (generalized): Secondary | ICD-10-CM | POA: Diagnosis not present

## 2021-10-26 DIAGNOSIS — M25551 Pain in right hip: Secondary | ICD-10-CM

## 2021-10-26 DIAGNOSIS — R2681 Unsteadiness on feet: Secondary | ICD-10-CM

## 2021-10-26 NOTE — Therapy (Signed)
[place] [facility] ?[place], [place], [address] ?Phone: [phone]   Fax:  [phone] ? ?Physical Therapy Treatment ? ?Patient Details  ?Name: Rebekah Hamilton Rebekah Hamilton ?MRN: [id number] ?Date of Birth: 1946-03-30 ?Referring Provider (PT): Dr. Louanne Skye ? ? ?Encounter Date: 10/26/2021 ? ? PT End of Session - 10/26/21 0903   ? ? Visit Number 7   ? Number of Visits 17   ? Date for PT Re-Evaluation 12/01/21   ? Authorization Type Medicare   ? PT Start Time [phone]   ? PT Stop Time 0930   ? PT Time Calculation (min) 40 min   ? Activity Tolerance Patient tolerated treatment well   ? Behavior During Therapy [facility] for tasks assessed/performed   ? ?  ?  ? ?  ? ? ?Past Medical History:  ?Diagnosis Date  ? Allergy   ? Anemia   ? Anxiety   ? Depression   ? Hyperlipidemia   ? Hypertension   ? Lumbar stenosis   ? Migraines   ? Prediabetes   ? ? ?Past Surgical History:  ?Procedure Laterality Date  ? BREAST LUMPECTOMY Bilateral   ? CHOLECYSTECTOMY  1999  ? SPINE SURGERY    ? ? ?There were no vitals filed for this visit. ? ? Subjective Assessment - 10/26/21 0859   ? ? Subjective Patient reports her back/hip pain are better.She is feeling a little hip pain this morning but she states that it felt so much better after last session. She reports, "I still feel like I can't walk as fast and that I can't do as much as I could before."   ? Pertinent History 76 yo Female reports RLE weakness, imbalance and decreased walking ability. Her PMH is significant:  She is s/p posterior lumbar interbody fusion L3-L4 with revision of fractured rod on 01-29-21 and noted s/p T9-S2 performed on 12-23-19. She reports after 2nd surgery, the surgeon did something to affect the nerve in her right hip; She reports since the surgery she hasn't been back to the surgeon because he is [facility] and that's too far. She has been seeing Dr. Louanne Skye in [place] and she will be following up with him. She reports  her pain is intermittent worse with standing/walking and is alleviated with sitting;she does have a back brace that she will wear as needed (has worn it 1x in last several weeks). She denies any numbness/tingling down RLE. She was given gabapentin but couldn't tolerate it because of memory loss; She also tried steroids which helped a little but it made her jittery; She reports nothing helps at night. She reports frequent sleep disturbances, taking tylenol/excedrin to help manage symptoms; She typically sleeps flat on back, has increased pain in sidelying and is unable to tolerate right sidelying; She reports multiple falls with 2 significant falls in last several months. In addition, she reports significant impairement to memory which she attributes to 2nd surgery and prolonged anesthesia;   ? Limitations Standing;Walking   ? How long can you sit comfortably? as long as needed, but increased pain upon standing;   ? How long can you stand comfortably? 20 min;   ? How long can you walk comfortably? 5-67mn   ? Patient Stated Goals "Get rid of pain."   ? Currently in Pain? Yes   ? Pain Score 2    ? Pain Location Hip   ? Pain Orientation Right   ? Pain Descriptors / Indicators Aching;Sore   ?  Pain Type Chronic pain   ? Pain Onset More than a month ago   ? Pain Frequency Intermittent   ? Aggravating Factors  worse with walking/turning over in bed   ? Pain Relieving Factors no pain in sitting   ? Effect of Pain on Daily Activities decreased activity tolerance;   ? Multiple Pain Sites No   ? Pain Onset More than a month ago   ? ?  ?  ? ?  ? ? ? ? ?TREATMENT: ?Hooklying with moist heat to low back concurrent with hooklying exercise ?-LE apart, lumbar trunk rotation (windshield wiper) x10 reps x 2 second holds on each side  each direction with cues to avoid painful ROM ?Manual A with following stretches:  ?single knee to chest 20 sec hold x1 rep each LE ?            - circles clock and counterclockwise x 5 ea  ?             -some pain with clockwise on the L, cues to avoid painful ranges  ?-single leg crossed over contralateral LE, hip adduction/flexion for IT band/piriformis stretch x5 reps ?-Crossing LLE over RLE, using LUE to pull RLE over for IT band stretch 5-10 sec hold x2 reps;  ? ?  ?  ?Hooklying: ?Posterior pelvic tilt 5 sec hold x5 reps ?Posterior pelvic tilt with BLE leg lift 90/90, 5 sec hold x5 reps, moderate difficulty ?SLR hip flexion 2# x12 reps each LE  ?Bridges with legs apart, green tband around BLE to challenge gluteal activation x10 reps with cues for proper positioning;  ?Suspended heel slides x5 reps each LE x2 sets, with BLE lifted to challenge core activation, no ankle weight;  ?  ?Sidelying: ?PT performed soft tissue massage to right lateral hip and down lateral leg focusing on right gluteal tendon as well as IT band. X10 min, utilized edge tool for IASTM. ?Patient reports moderate to severe tenderness which was alleviated following manual therapy; She does exhibit multiple trigger points and moderate tightness in right lateral hip; Reinforced importance of IT band stretches as part of HEP; ? ?Pt educated throughout session about proper posture and technique with exercises. Improved exercise technique, movement at target joints, use of target muscles after min to mod verbal, visual, tactile cues. ? ? ?She reports mild left side low back pain upon standing but minimal to no right hip pain;  ? ? ? ? ? ? ? ? ? ? ? ? ? ? ? ? ? ? ? ? ? PT Education - 10/26/21 0903   ? ? Education Details exercise technique/positioning, HEP reinforced;   ? Person(s) Educated Patient   ? Methods Explanation;Verbal cues   ? Comprehension Verbalized understanding;Returned demonstration;Verbal cues required   ? ?  ?  ? ?  ? ? ? PT Short Term Goals - 10/06/21 0919   ? ?  ? PT SHORT TERM GOAL #1  ? Title Patient will be adherent to HEP at least 3x a week to improve functional strength and balance for better safety at home.   ? Time 4   ?  Period Weeks   ? Status New   ? Target Date 11/03/21   ?  ? PT SHORT TERM GOAL #2  ? Title Patient will report maximum of 1 sleep disturbance per night for better quality of life and improved sleep tolerance;   ? Time 4   ? Period Weeks   ? Status New   ?  Target Date 11/03/21   ? ?  ?  ? ?  ? ? ? ? PT Long Term Goals - 10/06/21 0922   ? ?  ? PT LONG TERM GOAL #1  ? Title Patient will increase Functional Gait Assessment score to >20/30 as to reduce fall risk and improve dynamic gait safety with community ambulation.   ? Time 8   ? Period Weeks   ? Status New   ? Target Date 12/01/21   ?  ? PT LONG TERM GOAL #2  ? Title Patient will increase RLE gross strength to 4+/5 as to improve functional strength for independent gait, increased standing tolerance and increased ADL ability.   ? Time 8   ? Period Weeks   ? Status New   ? Target Date 12/01/21   ?  ? PT LONG TERM GOAL #3  ? Title Patient will report a worst pain of 3/10 on VAS in     right hip        to improve tolerance with ADLs and reduced symptoms with activities.   ? Time 8   ? Period Weeks   ? Status New   ? Target Date 12/01/21   ?  ? PT LONG TERM GOAL #4  ? Title Patient will increase six minute walk test distance to >1000 feet without increase in right hip pain for progression to community ambulator and improve gait ability   ? Time 8   ? Period Weeks   ? Status New   ? Target Date 12/01/21   ?  ? PT LONG TERM GOAL #5  ? Title Patient will improve FOTO score by 5% to indicate improved functional mobility with ADLs.   ? Time 8   ? Period Weeks   ? Status New   ? Target Date 12/01/21   ? ?  ?  ? ?  ? ? ? ? ? ? ? ? Plan - 10/26/21 0911   ? ? Clinical Impression Statement Patient motivated and participated well within session. She reports feeling like her back and hip have improved since starting therapy. She still has pain with advanced activity. She was instructed in advanced core/LE strengthening. Patient does require min VCs for proper exercise technique.  She continues to have soreness along right IT band/lateral hip which was alleviated with manual therapy. Patient reports less stiffness at end of session. She would benefit from additional skilled PT inte

## 2021-10-28 ENCOUNTER — Ambulatory Visit: Payer: Medicare Other | Admitting: Physical Therapy

## 2021-10-28 ENCOUNTER — Other Ambulatory Visit: Payer: Self-pay

## 2021-10-28 DIAGNOSIS — R2681 Unsteadiness on feet: Secondary | ICD-10-CM | POA: Diagnosis not present

## 2021-10-28 DIAGNOSIS — R262 Difficulty in walking, not elsewhere classified: Secondary | ICD-10-CM | POA: Diagnosis not present

## 2021-10-28 DIAGNOSIS — M25551 Pain in right hip: Secondary | ICD-10-CM | POA: Diagnosis not present

## 2021-10-28 DIAGNOSIS — M6281 Muscle weakness (generalized): Secondary | ICD-10-CM | POA: Diagnosis not present

## 2021-10-28 NOTE — Therapy (Signed)
[place] ?[facility] ?[place], [place], [address] ?Phone: [phone]   Fax:  [phone] ? ?Physical Therapy Treatment ? ?Patient Details  ?Name: Rebekah Hamilton ?MRN: [id number] ?Date of Birth: Mar 31, 1946 ?Referring Provider (PT): Dr. Louanne Skye ? ? ?Encounter Date: 10/28/2021 ? ? PT End of Session - 10/28/21 0856   ? ? Visit Number 8   ? Number of Visits 17   ? Date for PT Re-Evaluation 12/01/21   ? Authorization Type Medicare   ? PT Start Time [phone]   ? PT Stop Time 0929   ? PT Time Calculation (min) 39 min   ? Activity Tolerance Patient tolerated treatment well   ? Behavior During Therapy [facility] for tasks assessed/performed   ? ?  ?  ? ?  ? ? ?Past Medical History:  ?Diagnosis Date  ? Allergy   ? Anemia   ? Anxiety   ? Depression   ? Hyperlipidemia   ? Hypertension   ? Lumbar stenosis   ? Migraines   ? Prediabetes   ? ? ?Past Surgical History:  ?Procedure Laterality Date  ? BREAST LUMPECTOMY Bilateral   ? CHOLECYSTECTOMY  1999  ? SPINE SURGERY    ? ? ?There were no vitals filed for this visit. ? ? Subjective Assessment - 10/28/21 0854   ? ? Subjective Pt reports no hip pain today but a little pain in the back. Feelt the therapeutic programhas been helping significantly.   ? Pertinent History 76 yo Female reports RLE weakness, imbalance and decreased walking ability. Her PMH is significant:  She is s/p posterior lumbar interbody fusion L3-L4 with revision of fractured rod on 01-29-21 and noted s/p T9-S2 performed on 12-23-19. She reports after 2nd surgery, the surgeon did something to affect the nerve in her right hip; She reports since the surgery she hasn't been back to the surgeon because he is [facility] and that's too far. She has been seeing Dr. Louanne Skye in [place] and she will be following up with him. She reports her pain is intermittent worse with standing/walking and is alleviated with sitting;she does have a back brace that she will wear as  needed (has worn it 1x in last several weeks). She denies any numbness/tingling down RLE. She was given gabapentin but couldn't tolerate it because of memory loss; She also tried steroids which helped a little but it made her jittery; She reports nothing helps at night. She reports frequent sleep disturbances, taking tylenol/excedrin to help manage symptoms; She typically sleeps flat on back, has increased pain in sidelying and is unable to tolerate right sidelying; She reports multiple falls with 2 significant falls in last several months. In addition, she reports significant impairement to memory which she attributes to 2nd surgery and prolonged anesthesia;   ? Limitations Standing;Walking   ? How long can you sit comfortably? as long as needed, but increased pain upon standing;   ? How long can you stand comfortably? 20 min;   ? How long can you walk comfortably? 5-70mn   ? Patient Stated Goals "Get rid of pain."   ? Currently in Pain? Yes   ? Pain Score 1    ? Pain Location Back   ? Pain Orientation Right   ? Pain Descriptors / Indicators Sore   ? Pain Type Chronic pain   ? Pain Onset More than a month ago   ? Pain Frequency Intermittent   ? Pain Onset More than  a month ago   ? ?  ?  ? ?  ? ? ? ?TREATMENT: ?Hooklying with moist heat to low back concurrent with hooklying exercise ?-LE apart, lumbar trunk rotation (windshield wiper) x10 reps x 2 second holds on each side  each direction with cues to avoid painful ROM ?Manual A with following stretches:  ?single knee to chest 20 sec hold x1 rep each LE ?            - circles clock and counterclockwise x 5 ea  ?            -some pain with clockwise on the L, cues to avoid painful ranges  ?-single leg crossed over contralateral LE, hip adduction/flexion for IT band/piriformis stretch 2*45 seconds  reps ?-Crossing LLE over RLE, using LUE to pull RLE over for IT band stretch 5-10 sec hold x2 reps;  ? ?  ?  ?Hooklying: ?Posterior pelvic tilt 5 sec hold x5  reps ?Posterior pelvic tilt with LE leg lift 90/90, 5 sec hold x5 reps ?Dead bugs ( Le lift only with arms in flexion) x 5 ea  ?Bird dogs LE only for gluteal activation and core stabilization.  ?Bridges with legs apart, green tband around BLE to challenge gluteal activation x10 reps with cues for proper positioning and movement pattern  ?  ?Sidelying: ?PT performed soft tissue massage to right lateral hip and down lateral leg focusing on right gluteal tendon as well as IT band. X10 min, utilized edge tool for IASTM. ?Patient reports moderate to severe tenderness which was alleviated following manual therapy; She does exhibit multiple trigger points and moderate tightness in right lateral hip; Reinforced importance of IT band stretches as part of HEP; ? ?Pt educated throughout session about proper posture and technique with exercises. Improved exercise technique, movement at target joints, use of target muscles after min to mod verbal, visual, tactile cues. ? ? ? ? ? ? ? ? ? ? ? ? ? ? ? ? ? ? ? ? ? ? ? ? ? ? ? ? ? ? ? PT Education - 10/28/21 1103   ? ? Education Details Exercise form and technique   ? Person(s) Educated Patient   ? Methods Explanation   ? Comprehension Verbalized understanding   ? ?  ?  ? ?  ? ? ? PT Short Term Goals - 10/06/21 0919   ? ?  ? PT SHORT TERM GOAL #1  ? Title Patient will be adherent to HEP at least 3x a week to improve functional strength and balance for better safety at home.   ? Time 4   ? Period Weeks   ? Status New   ? Target Date 11/03/21   ?  ? PT SHORT TERM GOAL #2  ? Title Patient will report maximum of 1 sleep disturbance per night for better quality of life and improved sleep tolerance;   ? Time 4   ? Period Weeks   ? Status New   ? Target Date 11/03/21   ? ?  ?  ? ?  ? ? ? ? PT Long Term Goals - 10/06/21 0922   ? ?  ? PT LONG TERM GOAL #1  ? Title Patient will increase Functional Gait Assessment score to >20/30 as to reduce fall risk and improve dynamic gait safety with  community ambulation.   ? Time 8   ? Period Weeks   ? Status New   ? Target Date 12/01/21   ?  ?  PT LONG TERM GOAL #2  ? Title Patient will increase RLE gross strength to 4+/5 as to improve functional strength for independent gait, increased standing tolerance and increased ADL ability.   ? Time 8   ? Period Weeks   ? Status New   ? Target Date 12/01/21   ?  ? PT LONG TERM GOAL #3  ? Title Patient will report a worst pain of 3/10 on VAS in     right hip        to improve tolerance with ADLs and reduced symptoms with activities.   ? Time 8   ? Period Weeks   ? Status New   ? Target Date 12/01/21   ?  ? PT LONG TERM GOAL #4  ? Title Patient will increase six minute walk test distance to >1000 feet without increase in right hip pain for progression to community ambulator and improve gait ability   ? Time 8   ? Period Weeks   ? Status New   ? Target Date 12/01/21   ?  ? PT LONG TERM GOAL #5  ? Title Patient will improve FOTO score by 5% to indicate improved functional mobility with ADLs.   ? Time 8   ? Period Weeks   ? Status New   ? Target Date 12/01/21   ? ?  ?  ? ?  ? ? ? ? ? ? ? ? Plan - 10/28/21 0856   ? ? Clinical Impression Statement Patient demonstrates good motivation for completion of physical therapy program. Patient did not report increase in pain with any exercises performed today and reported improved signs and symptoms in her right lateral lower extremity along her IT band following manual therapy this session.Pt progressed with several core strengthening exercsie with no increase in pain. Patient will continue to benefit from skilled physical therapy intervention in order to improve her lower extremity strength, range of motion, and improve pain and quality of life.   ? Personal Factors and Comorbidities Comorbidity 3+;Past/Current Experience;Time since onset of injury/illness/exacerbation   ? Comorbidities HTN(variable), pre-diabetic, past spinal fusion and revision, anxiety/depression (somewhat  controlled), recent falls   ? Examination-Activity Limitations Bed Mobility;Locomotion Level;Sleep;Squat;Stairs;Stand;Transfers   ? Examination-Participation Restrictions Cleaning;Community Activity;Shop;Volunteer;[employer] Work   ?

## 2021-10-29 ENCOUNTER — Telehealth: Payer: Self-pay

## 2021-10-29 NOTE — Telephone Encounter (Signed)
10/29/21-Called patient and completed GAD-7 & PHQ-9 assessment and reviewed chart. ? ?Total time spent: 21 Minutes ?Vanetta Shawl, [facility] ?

## 2021-11-02 ENCOUNTER — Encounter: Payer: Self-pay | Admitting: Physical Therapy

## 2021-11-02 ENCOUNTER — Other Ambulatory Visit: Payer: Self-pay

## 2021-11-02 ENCOUNTER — Ambulatory Visit: Payer: Medicare Other | Admitting: Physical Therapy

## 2021-11-02 DIAGNOSIS — M6281 Muscle weakness (generalized): Secondary | ICD-10-CM

## 2021-11-02 DIAGNOSIS — R2681 Unsteadiness on feet: Secondary | ICD-10-CM | POA: Diagnosis not present

## 2021-11-02 DIAGNOSIS — M25551 Pain in right hip: Secondary | ICD-10-CM | POA: Diagnosis not present

## 2021-11-02 DIAGNOSIS — R262 Difficulty in walking, not elsewhere classified: Secondary | ICD-10-CM | POA: Diagnosis not present

## 2021-11-02 NOTE — Therapy (Signed)
[place] [facility] ?[place], [place], [address] ?Phone: [phone]   Fax:  [phone] ? ?Physical Therapy Treatment ? ?Patient Details  ?Name: Rebekah Hamilton [name] ?MRN: [id number] ?Date of Birth: Jul 14, 1946 ?Referring Provider (PT): Dr. Louanne Skye ? ? ?Encounter Date: 11/02/2021 ? ? PT End of Session - 11/02/21 0903   ? ? Visit Number 9   ? Number of Visits 17   ? Date for PT Re-Evaluation 12/01/21   ? Authorization Type Medicare   ? PT Start Time [phone]   ? PT Stop Time 0930   ? PT Time Calculation (min) 41 min   ? Activity Tolerance Patient tolerated treatment well   ? Behavior During Therapy [facility] for tasks assessed/performed   ? ?  ?  ? ?  ? ? ?Past Medical History:  ?Diagnosis Date  ? Allergy   ? Anemia   ? Anxiety   ? Depression   ? Hyperlipidemia   ? Hypertension   ? Lumbar stenosis   ? Migraines   ? Prediabetes   ? ? ?Past Surgical History:  ?Procedure Laterality Date  ? BREAST LUMPECTOMY Bilateral   ? CHOLECYSTECTOMY  1999  ? SPINE SURGERY    ? ? ?There were no vitals filed for this visit. ? ? Subjective Assessment - 11/02/21 0854   ? ? Subjective Patient reports her back has been more sore this last week. She states it could be related to weather with colder temps; She reports she hasn't had much time to work on core strengthening;   ? Pertinent History 76 yo Female reports RLE weakness, imbalance and decreased walking ability. Her PMH is significant:  She is s/p posterior lumbar interbody fusion L3-L4 with revision of fractured rod on 01-29-21 and noted s/p T9-S2 performed on 12-23-19. She reports after 2nd surgery, the surgeon did something to affect the nerve in her right hip; She reports since the surgery she hasn't been back to the surgeon because he is [facility] and that's too far. She has been seeing Dr. Louanne Skye in [place] and she will be following up with him. She reports her pain is intermittent worse with standing/walking and is  alleviated with sitting;she does have a back brace that she will wear as needed (has worn it 1x in last several weeks). She denies any numbness/tingling down RLE. She was given gabapentin but couldn't tolerate it because of memory loss; She also tried steroids which helped a little but it made her jittery; She reports nothing helps at night. She reports frequent sleep disturbances, taking tylenol/excedrin to help manage symptoms; She typically sleeps flat on back, has increased pain in sidelying and is unable to tolerate right sidelying; She reports multiple falls with 2 significant falls in last several months. In addition, she reports significant impairement to memory which she attributes to 2nd surgery and prolonged anesthesia;   ? Limitations Standing;Walking   ? How long can you sit comfortably? as long as needed, but increased pain upon standing;   ? How long can you stand comfortably? 20 min;   ? How long can you walk comfortably? 5-53mn   ? Patient Stated Goals "Get rid of pain."   ? Currently in Pain? Yes   ? Pain Score 4    ? Pain Location Back   ? Pain Orientation Left;Mid   ? Pain Descriptors / Indicators Aching;Sore   ? Pain Type Chronic pain   ? Pain Onset More than a month  ago   ? Pain Frequency Intermittent   ? Aggravating Factors  worse when walking/turning over in bed   ? Pain Relieving Factors no pain in sitting   ? Effect of Pain on Daily Activities decreased activity tolerance;   ? Multiple Pain Sites Yes   ? Pain Score 2   ? Pain Location Hip   ? Pain Orientation Right   ? Pain Descriptors / Indicators Aching;Sore;Tightness   ? Pain Type Chronic pain   ? Pain Onset More than a month ago   ? Pain Frequency Intermittent   ? Aggravating Factors  turning over in bed/tender to touch   ? Pain Relieving Factors sitting   ? Effect of Pain on Daily Activities decreased activity tolerance;   ? ?  ?  ? ?  ? ? ? ? ? ? ?TREATMENT: ?Hooklying with moist heat to low back concurrent with hooklying  exercise ?-LE apart, lumbar trunk rotation (windshield wiper) x10 reps x 2 second holds on each side  each direction with cues to avoid painful ROM ?Manual A with following stretches:  ?single knee to chest 20 sec hold x1 rep each LE ?            - circles clock and counterclockwise x 5 ea  ?            -some pain with clockwise on the L, cues to avoid painful ranges  ?-single leg crossed over contralateral LE, hip adduction/flexion for IT band/piriformis stretch x5 reps ?-Crossing LLE over RLE, using LUE to pull RLE over for IT band stretch 5-10 sec hold x2 reps;  ?  ?  ?  ?Hooklying: ?Posterior pelvic tilt 5 sec hold x5 reps ?Posterior pelvic tilt with BLE leg lift 90/90, 5 sec hold x5 reps, with 2# ankle weight moderate difficulty ?SLR hip flexion 2# x10 reps each LE  ?Bridges with legs apart, green tband around BLE to challenge gluteal activation x15 reps with cues for proper positioning;  ?Suspended heel slides 2# single leg x10 reps each LE with moderate difficulty reported;  ?Small ball between legs, hip adduction BLE leg lift x5 reps with cues for core stabilization and to avoid arching spine,mild difficulty reported;  ?  ?Sidelying: ?PT performed soft tissue massage to right lateral hip and down lateral leg focusing on right gluteal tendon as well as IT band. X12 min, utilized edge tool for IASTM. ?Patient reports moderate to severe tenderness which was alleviated following manual therapy; She does exhibit multiple trigger points and moderate tightness in right lateral hip; Reinforced importance of IT band stretches as part of HEP; ?  ?Pt educated throughout session about proper posture and technique with exercises. Improved exercise technique, movement at target joints, use of target muscles after min to mod verbal, visual, tactile cues. ?  ?  ?She reports less soreness at end of session.  ? ? ?  ?  ? ? ? ? ? ? ? ? ? ? ? ? ? ? ? ? ? ? ? ? ? ? PT Education - 11/02/21 0903   ? ? Education Details exercise  positioning/ROM   ? Person(s) Educated Patient   ? Methods Explanation;Verbal cues   ? Comprehension Verbalized understanding;Returned demonstration;Verbal cues required;Need further instruction   ? ?  ?  ? ?  ? ? ? PT Short Term Goals - 10/06/21 0919   ? ?  ? PT SHORT TERM GOAL #1  ? Title Patient will be adherent to HEP at  least 3x a week to improve functional strength and balance for better safety at home.   ? Time 4   ? Period Weeks   ? Status New   ? Target Date 11/03/21   ?  ? PT SHORT TERM GOAL #2  ? Title Patient will report maximum of 1 sleep disturbance per night for better quality of life and improved sleep tolerance;   ? Time 4   ? Period Weeks   ? Status New   ? Target Date 11/03/21   ? ?  ?  ? ?  ? ? ? ? PT Long Term Goals - 10/06/21 0922   ? ?  ? PT LONG TERM GOAL #1  ? Title Patient will increase Functional Gait Assessment score to >20/30 as to reduce fall risk and improve dynamic gait safety with community ambulation.   ? Time 8   ? Period Weeks   ? Status New   ? Target Date 12/01/21   ?  ? PT LONG TERM GOAL #2  ? Title Patient will increase RLE gross strength to 4+/5 as to improve functional strength for independent gait, increased standing tolerance and increased ADL ability.   ? Time 8   ? Period Weeks   ? Status New   ? Target Date 12/01/21   ?  ? PT LONG TERM GOAL #3  ? Title Patient will report a worst pain of 3/10 on VAS in     right hip        to improve tolerance with ADLs and reduced symptoms with activities.   ? Time 8   ? Period Weeks   ? Status New   ? Target Date 12/01/21   ?  ? PT LONG TERM GOAL #4  ? Title Patient will increase six minute walk test distance to >1000 feet without increase in right hip pain for progression to community ambulator and improve gait ability   ? Time 8   ? Period Weeks   ? Status New   ? Target Date 12/01/21   ?  ? PT LONG TERM GOAL #5  ? Title Patient will improve FOTO score by 5% to indicate improved functional mobility with ADLs.   ? Time 8   ? Period  Weeks   ? Status New   ? Target Date 12/01/21   ? ?  ?  ? ?  ? ? ? ? ? ? ? ? Plan - 11/02/21 1056   ? ? Clinical Impression Statement Patient motivated and participated well within session. Progressed core strengthening w

## 2021-11-05 ENCOUNTER — Ambulatory Visit: Payer: Medicare Other | Admitting: Physical Therapy

## 2021-11-05 ENCOUNTER — Encounter: Payer: Self-pay | Admitting: Physical Therapy

## 2021-11-05 ENCOUNTER — Other Ambulatory Visit: Payer: Self-pay

## 2021-11-05 DIAGNOSIS — R262 Difficulty in walking, not elsewhere classified: Secondary | ICD-10-CM | POA: Diagnosis not present

## 2021-11-05 DIAGNOSIS — R2681 Unsteadiness on feet: Secondary | ICD-10-CM

## 2021-11-05 DIAGNOSIS — M25551 Pain in right hip: Secondary | ICD-10-CM

## 2021-11-05 DIAGNOSIS — M6281 Muscle weakness (generalized): Secondary | ICD-10-CM | POA: Diagnosis not present

## 2021-11-05 NOTE — Therapy (Signed)
[place] [facility] ?[place], [place], [address] ?Phone: [phone]   Fax:  [phone] ? ?Physical Therapy Treatment/ Physical Therapy Progress Note ? ? ?Dates of reporting period  10/07/21   to   11/05/21 ? ? ?Patient Details  ?Name: Rebekah Marquina Shimer Ishmael Hamilton ?MRN: [id number] ?Date of Birth: [birth year] ?Referring Provider (PT): Dr. Louanne Skye ? ? ?Encounter Date: 11/05/2021 ? ? PT End of Session - 11/05/21 0834   ? ? Visit Number 10   ? Number of Visits 17   ? Date for PT Re-Evaluation 12/01/21   ? Authorization Type Medicare   ? PT Start Time 0830   ? PT Stop Time 4008   ? PT Time Calculation (min) 45 min   ? Activity Tolerance Patient tolerated treatment well   ? Behavior During Therapy [facility] for tasks assessed/performed   ? ?  ?  ? ?  ? ? ?Past Medical History:  ?Diagnosis Date  ? Allergy   ? Anemia   ? Anxiety   ? Depression   ? Hyperlipidemia   ? Hypertension   ? Lumbar stenosis   ? Migraines   ? Prediabetes   ? ? ?Past Surgical History:  ?Procedure Laterality Date  ? BREAST LUMPECTOMY Bilateral   ? CHOLECYSTECTOMY  1999  ? SPINE SURGERY    ? ? ?There were no vitals filed for this visit. ? ? Subjective Assessment - 11/05/21 0833   ? ? Subjective Patient reports her back has been more sore this last week. She states the hip has been particularly sore. She reports improvment in her ambulatory capacity and some inabilty to complete HEP over past week or 2 due to her busy schedule. Pt was implored to be more diligent with HEP for ipmrovement in her pain and discomfort in the hip.  Patient does report she would like to feel more comfortable with activities such as line dancing without feeling like her right hip will "give way"   ? Pertinent History 76 yo Female reports RLE weakness, imbalance and decreased walking ability. Her PMH is significant:  She is s/p posterior lumbar interbody fusion L3-L4 with revision of fractured rod on 01-29-21 and noted s/p T9-S2  performed on 12-23-19. She reports after 2nd surgery, the surgeon did something to affect the nerve in her right hip; She reports since the surgery she hasn't been back to the surgeon because he is [facility] and that's too far. She has been seeing Dr. Louanne Skye in [place] and she will be following up with him. She reports her pain is intermittent worse with standing/walking and is alleviated with sitting;she does have a back brace that she will wear as needed (has worn it 1x in last several weeks). She denies any numbness/tingling down RLE. She was given gabapentin but couldn't tolerate it because of memory loss; She also tried steroids which helped a little but it made her jittery; She reports nothing helps at night. She reports frequent sleep disturbances, taking tylenol/excedrin to help manage symptoms; She typically sleeps flat on back, has increased pain in sidelying and is unable to tolerate right sidelying; She reports multiple falls with 2 significant falls in last several months. In addition, she reports significant impairement to memory which she attributes to 2nd surgery and prolonged anesthesia;   ? Limitations Standing;Walking   ? How long can you sit comfortably? as long as needed, but increased pain upon standing;   ? How long can you stand comfortably?  20 min;   ? How long can you walk comfortably? 5-35mn   ? Patient Stated Goals "Get rid of pain."   ? Currently in Pain? Yes   ? Pain Score 5    ? Pain Location Leg   ? Pain Orientation Right   ? Pain Descriptors / Indicators Aching   ? Pain Type Chronic pain   ? Pain Onset More than a month ago   ? Pain Frequency Intermittent   ? Aggravating Factors  Sleeping at night, prolonged walking activities during the day   ? Pain Relieving Factors PT, stretching.   ? Pain Onset More than a month ago   ? ?  ?  ? ?  ? ? ? ? ? [facility] PT Assessment - 11/05/21 0001   ? ?  ? Functional Gait  Assessment  ? Gait assessed  Yes   ? Gait Level Surface Walks 20 ft in  less than 5.5 sec, no assistive devices, good speed, no evidence for imbalance, normal gait pattern, deviates no more than 6 in outside of the 12 in walkway width.   ? Change in Gait Speed Able to smoothly change walking speed without loss of balance or gait deviation. Deviate no more than 6 in outside of the 12 in walkway width.   ? Gait with Horizontal Head Turns Performs head turns smoothly with no change in gait. Deviates no more than 6 in outside 12 in walkway width   ? Gait with Vertical Head Turns Performs task with slight change in gait velocity (eg, minor disruption to smooth gait path), deviates 6 - 10 in outside 12 in walkway width or uses assistive device   ? Gait and Pivot Turn Pivot turns safely within 3 sec and stops quickly with no loss of balance.   ? Step Over Obstacle Is able to step over one shoe box (4.5 in total height) but must slow down and adjust steps to clear box safely. May require verbal cueing.   ? Gait with Narrow Base of Support Ambulates 7-9 steps.   ? Gait with Eyes Closed Walks 20 ft, uses assistive device, slower speed, mild gait deviations, deviates 6-10 in outside 12 in walkway width. Ambulates 20 ft in less than 9 sec but greater than 7 sec.   ? Ambulating Backwards Walks 20 ft, uses assistive device, slower speed, mild gait deviations, deviates 6-10 in outside 12 in walkway width.   ? Steps Alternating feet, must use rail.   ? Total Score 23   ? ?  ?  ? ?  ? ? ? ? ? ?Exercise/Activity Sets/Reps/Time/ Resistance Assistance Charge type Comments  ?Lower extremity strength testing    4+ out of 5 for hip abduction, knee extension, knee flexion bilaterally ?4 of 5 for hip abduction, hip flexion bilaterally ?Significant improvement since initial evaluation  ?6-minute walk test 1215 feet Standby assist therex Patient met goal for community ambulation but does report she would like to be able to cover more ground or to complete normal activities at closer to her "normal speed"  ?Lower  extremity stretching including knee-to-chest stretch on each leg, piriformis stretch on each leg, hip circles on each leg both clockwise and counterclockwise, and IT band/tensor fascia lata stretch bilateral X30 seconds each Manual assistance Manual   ?Functional gait assessment  Contact-guard assist Therapeutic exercise Patient demonstrates improved score on this but still reports some unsteadiness and demonstrates unsteadiness with several components of this task.  ?F OTO score 53%  N/a Significant improvement from initial evaluation patient still feels in physical therapist agrees her maximal improvement has not been met regarding confidence with various balance related activities.  This does however indicate improvement and patient subjective improvement with balance related tasks.  ?      ?      ?      ?      ?      ?      ?Treatment Provided this session  ? ?Pt educated throughout session about proper posture and technique with exercises. Improved exercise technique, movement at target joints, use of target muscles after min to mod verbal, visual, tactile cues. ?Note: Portions of this document were prepared using Dragon voice recognition software and although reviewed may contain unintentional dictation errors in syntax, grammar, or spelling. ? ? ? ? ? ? ? ? ? ? ? ? ? ? ? ? ? ? ? ? ? PT Education - 11/05/21 0925   ? ? Education Details Progress and plan of care moving forward   ? Person(s) Educated Patient   ? Methods Explanation;Verbal cues   ? Comprehension Verbalized understanding   ? ?  ?  ? ?  ? ? ? PT Short Term Goals - 11/05/21 0835   ? ?  ? PT SHORT TERM GOAL #1  ? Title Patient will be adherent to HEP at least 3x a week to improve functional strength and balance for better safety at home.   ? Time 4   ? Period Weeks   ? Status Partially Met   ? Target Date 12/03/21   ?  ? PT SHORT TERM GOAL #2  ? Title Patient will report maximum of 1 sleep disturbance per night for better quality of life and improved  sleep tolerance;   ? Baseline sleep disturbed less but this week has been more effected   ? Time 4   ? Period Weeks   ? Status On-going   ? Target Date 12/03/21   ? ?  ?  ? ?  ? ? ? ? PT Long Term Goals - 03/

## 2021-11-07 ENCOUNTER — Other Ambulatory Visit: Payer: Self-pay | Admitting: Internal Medicine

## 2021-11-09 ENCOUNTER — Other Ambulatory Visit: Payer: Self-pay

## 2021-11-09 ENCOUNTER — Encounter: Payer: Self-pay | Admitting: Physical Therapy

## 2021-11-09 ENCOUNTER — Ambulatory Visit: Payer: Medicare Other | Admitting: Physical Therapy

## 2021-11-09 DIAGNOSIS — R262 Difficulty in walking, not elsewhere classified: Secondary | ICD-10-CM

## 2021-11-09 DIAGNOSIS — M25551 Pain in right hip: Secondary | ICD-10-CM | POA: Diagnosis not present

## 2021-11-09 DIAGNOSIS — R2681 Unsteadiness on feet: Secondary | ICD-10-CM | POA: Diagnosis not present

## 2021-11-09 DIAGNOSIS — M6281 Muscle weakness (generalized): Secondary | ICD-10-CM

## 2021-11-09 NOTE — Therapy (Signed)
[facility] ?[place], [place], [address] ?Phone: [phone]   Fax:  [phone] ? ?Physical Therapy Treatment ? ?Patient Details  ?Name: Rebekah Hamilton [name] ?MRN: [id number] ?Date of Birth: Dec 28, 1945 ?Referring Provider (PT): Dr. Louanne Skye ? ? ?Encounter Date: 11/09/2021 ? ? PT End of Session - 11/09/21 1657   ? ? Visit Number 11   ? Number of Visits 17   ? Date for PT Re-Evaluation 12/01/21   ? Authorization Type Medicare   ? PT Start Time 1430   ? PT Stop Time 1515   ? PT Time Calculation (min) 45 min   ? Activity Tolerance Patient tolerated treatment well   ? Behavior During Therapy [facility] for tasks assessed/performed   ? ?  ?  ? ?  ? ? ?Past Medical History:  ?Diagnosis Date  ? Allergy   ? Anemia   ? Anxiety   ? Depression   ? Hyperlipidemia   ? Hypertension   ? Lumbar stenosis   ? Migraines   ? Prediabetes   ? ? ?Past Surgical History:  ?Procedure Laterality Date  ? BREAST LUMPECTOMY Bilateral   ? CHOLECYSTECTOMY  1999  ? SPINE SURGERY    ? ? ?There were no vitals filed for this visit. ? ? Subjective Assessment - 11/09/21 1434   ? ? Subjective Patient reports her back and hip pain are improved at this time. Pt still reports her butt gets sore at times.   ? Pertinent History 76 yo Female reports RLE weakness, imbalance and decreased walking ability. Her PMH is significant:  She is s/p posterior lumbar interbody fusion L3-L4 with revision of fractured rod on 01-29-21 and noted s/p T9-S2 performed on 12-23-19. She reports after 2nd surgery, the surgeon did something to affect the nerve in her right hip; She reports since the surgery she hasn't been back to the surgeon because he is [facility] and that's too far. She has been seeing Dr. Louanne Skye in [place] and she will be following up with him. She reports her pain is intermittent worse with standing/walking and is alleviated with sitting;she does have a back brace that she will wear as needed (has  worn it 1x in last several weeks). She denies any numbness/tingling down RLE. She was given gabapentin but couldn't tolerate it because of memory loss; She also tried steroids which helped a little but it made her jittery; She reports nothing helps at night. She reports frequent sleep disturbances, taking tylenol/excedrin to help manage symptoms; She typically sleeps flat on back, has increased pain in sidelying and is unable to tolerate right sidelying; She reports multiple falls with 2 significant falls in last several months. In addition, she reports significant impairement to memory which she attributes to 2nd surgery and prolonged anesthesia;   ? Limitations Standing;Walking   ? How long can you sit comfortably? as long as needed, but increased pain upon standing;   ? How long can you stand comfortably? 20 min;   ? How long can you walk comfortably? 5-68mn   ? Patient Stated Goals "Get rid of pain."   ? Pain Onset More than a month ago   ? Pain Onset More than a month ago   ? ?  ?  ? ?  ? ? ? ? ? ?TREATMENT: ?Hooklying with moist heat to low back concurrent with hooklying exercise ?-LE apart, lumbar trunk rotation (windshield wiper) x10 reps x 2 second holds on each side  each direction with cues to avoid painful ROM ?single knee to chest 20 sec hold x1 rep each LE ?            - circles clock and counterclockwise x 5 ea  ?            -some pain with clockwise on the L, cues to avoid painful ranges  ?-Crossing LLE over RLE, using LUE to pull RLE over for IT band stretch 5-10 sec hold x2 reps;  ? ?  ?  ?Hooklying: ?Posterior pelvic tilt 5 sec hold x5 reps ?Posterior pelvic tilt with LE leg lift 90/90, 5 sec hold x5 reps ? ?Bird dogs LE first followed by upper extremity for gluteal activation and core stabilization.  Patient did feel wobbly when using left lower extremity as stability lower extremity. ? ?All the above patient provided with handout for home exercise program. ?  ?Sidelying: ?PT performed soft  tissue massage to right lateral hip and down lateral leg focusing on right gluteal tendon as well as IT band. X10 min, utilized edge tool for IASTM. ?Patient reports moderate to severe tenderness which was alleviated following manual therapy; She does exhibit multiple trigger points and moderate tightness in right lateral hip; Reinforced importance of IT band stretches as part of HEP; ?Exercise/Activity Sets/ Reps/Time/ Resistance Assistance Charge type Comments  ?      ?      ?      ?      ?Airex step lateral 10 x ea  CGA Neuro re-ed Good efficacy with no LOB noted.   ?SLS progression - 1 LE airex and other on 6 inch step  X 30 seconds with ea LE with normal, horizontal head turns and vertical head nods CGA  Neuro re-ed Most difficulty noted with head nods   ?      ?      ?      ?      ?      ?Treatment provided this session ? ? ?Pt educated throughout session about proper posture and technique with exercises. Improved exercise technique, movement at target joints, use of target muscles after min to mod verbal, visual, tactile cues. ?Note: Portions of this document were prepared using Dragon voice recognition software and although reviewed may contain unintentional dictation errors in syntax, grammar, or spelling. ?  ? ?Pt educated throughout session about proper posture and technique with exercises. Improved exercise technique, movement at target joints, use of target muscles after min to mod verbal, visual, tactile cues. ? ? ? ? ? ? ? ? ? ? ? ? ? ? ? ? ? ? ? ? ? ? ? ? ? ? ? ? ? ? ? PT Education - 11/09/21 1656   ? ? Education Details HEP updated   ? Person(s) Educated Patient   ? Methods Explanation;Handout   ? Comprehension Verbalized understanding;Returned demonstration   ? ?  ?  ? ?  ? ? ? PT Short Term Goals - 11/05/21 0835   ? ?  ? PT SHORT TERM GOAL #1  ? Title Patient will be adherent to HEP at least 3x a week to improve functional strength and balance for better safety at home.   ? Time 4   ? Period Weeks    ? Status Partially Met   ? Target Date 12/03/21   ?  ? PT SHORT TERM GOAL #2  ? Title Patient will report maximum of 1 sleep disturbance per  night for better quality of life and improved sleep tolerance;   ? Baseline sleep disturbed less but this week has been more effected   ? Time 4   ? Period Weeks   ? Status On-going   ? Target Date 12/03/21   ? ?  ?  ? ?  ? ? ? ? PT Long Term Goals - 11/05/21 0838   ? ?  ? PT LONG TERM GOAL #1  ? Title Patient will increase Functional Gait Assessment score to >20/30 as to reduce fall risk and improve dynamic gait safety with community ambulation.   ? Time 8   ? Period Weeks   ? Status Achieved   ? Target Date 12/01/21   ?  ? PT LONG TERM GOAL #2  ? Title Patient will increase RLE gross strength to 4+/5 as to improve functional strength for independent gait, increased standing tolerance and increased ADL ability.   ? Baseline 4/5 with hip flexion and abduction   ? Time 8   ? Period Weeks   ? Status Partially Met   ? Target Date 12/01/21   ?  ? PT LONG TERM GOAL #3  ? Title Patient will report a worst pain of 3/10 on VAS in     right hip        to improve tolerance with ADLs and reduced symptoms with activities.   ? Baseline 5-6/10 in hip this week but had a decrease in pain for a while.   ? Time 8   ? Period Weeks   ? Status Not Met   ? Target Date 12/01/21   ?  ? PT LONG TERM GOAL #4  ? Title Patient will increase six minute walk test distance to >1000 feet without increase in right hip pain for progression to community ambulator and improve gait ability   ? Time 8   ? Period Weeks   ? Status Achieved   ? Target Date 12/01/21   ?  ? PT LONG TERM GOAL #5  ? Title Patient will improve FOTO score by 5% to indicate improved functional mobility with ADLs.   ? Baseline FOTO from 48 at IE to 53 on 11/05/21   ? Time 8   ? Period Weeks   ? Status Achieved   ? Target Date 12/01/21   ?  ? Additional Long Term Goals  ? Additional Long Term Goals Yes   ?  ? PT LONG TERM GOAL #6  ? Title  Patient will improve functional gait assessment score to 26 out of 30 or better in order to improve her fall risk as well as improve her functional mobility   ? Baseline 23 of 30 on 11/05/2021   ? Time 6

## 2021-11-11 NOTE — Progress Notes (Signed)
?MEDICARE ANNUAL WELLNESS VISIT AND FOLLOW UP ? ?Assessment:  ? ?Diagnoses and all orders for this visit: ? ?Encounter for Medicare annual wellness exam ?Patient will ask GYN about ordering follow up DEXA scan ? ?Essential hypertension ?At goal; continue meds ?Monitor blood pressure at home; call if consistently over 130/80 ?Continue DASH diet.   ?Reminder to go to the ER if any CP, SOB, nausea, dizziness, severe HA, changes vision/speech, left arm numbness and tingling and jaw pain. ? ?Migraine without aura and without status migrainosus, not intractable ?Improved from previous - has mild HA once a week but does not progress to full migraines; continue medications ?Follow up neuro - [facility] ? ?Mixed hyperlipidemia ?At goal; continue medications ?Continue low cholesterol diet and exercise.  ?Check lipid panel.  ?-     Lipid panel ?-     TSH ? ?Other abnormal glucose ?Last A1Cs at goal  ?Discussed disease and risks ?Discussed diet/exercise, weight management  ?Check A1C ? ?Allergic state, sequela ?Continue allergy medications as needed; rotate agent q 6 months, hygiene discussed ? ?Depression, major, recurrent, active ([place]) with anxiety ?No improvement with Duloxetine 60 mg QD ?Will add Trintellix 5 mg ?Follow up in 4 weeks ?Encouraged counseling/CBT - insurance issue -Dr. Hilliard Clark informaion given  ?Lifestyle discussed: diet/exerise, sleep hygiene, stress management, hydration ?Instructed patient to contact office or on-call physician promptly should condition worsen or any new symptoms appear. IF THE PATIENT HAS ANY SUICIDAL OR HOMICIDAL IDEATIONS, CALL THE OFFICE, DISCUSS WITH A SUPPORT MEMBER, OR GO TO THE ER IMMEDIATELY. Patient was agreeable with this plan.  ? ?Spinal stenosis of lumbar region/right hip pain ?Continue follow up with ortho/neurosurgery  ?Continue PT ? ?Medication management ?-     CBC with Differential/Platelet ?-     CMP/GFR ? ?Vitamin D deficiency ?continue to recommend supplementation  for goal of 70-100 ? ?Colon Cancer Screening ?- Cologuard ordered ? ?Glaucoma, unspecified glaucoma type, unspecified laterality ?Very mild - currently under observation ?Continue follow up with ophthalmology ? ?CKD (chronic kidney disease), symptom management only, stage 2 (mild) ?Increase fluids, avoid NSAIDS, monitor sugars, will monitor ?-     CMP WITH GFR ? ?OAB ?Currently off of medications and doing well; monitor  ? ? ?Over 40 minutes of exam, counseling, chart review and critical decision making was performed ?Future Appointments  ?Date Time Provider [place]  ?11/16/2021 10:45 AM Hillis Range, PT [facility] None  ?11/18/2021 11:45 AM Hillis Range, PT [facility] None  ?11/23/2021 10:45 AM Hillis Range, PT [facility] None  ?11/26/2021  8:45 AM Particia Lather, PT [facility] None  ?11/30/2021  8:45 AM Hillis Range, PT [facility] None  ?12/02/2021  8:45 AM Particia Lather, PT [facility] None  ?12/07/2021 10:30 AM Hillis Range, PT [facility] None  ?12/09/2021 10:15 AM Hillis Range, PT [facility] None  ?12/16/2021  8:45 AM Zollie Pee, PT [facility] None  ?12/21/2021  8:45 AM Hillis Range, PT [facility] None  ?12/23/2021  9:30 AM Hillis Range, PT [facility] None  ?12/30/2021  9:30 AM Hillis Range, PT [facility] None  ?01/04/2022  2:30 PM Hillis Range, PT [facility] None  ?01/06/2022 11:45 AM [place], Kathlee Nations, PT [facility] None  ?01/13/2022  8:45 AM Zollie Pee, PT [facility] None  ?01/20/2022 10:15 AM Hillis Range, PT [facility] None  ?01/25/2022 10:15 AM Hillis Range, PT [facility] None  ?01/27/2022 10:15 AM Hillis Range, PT [facility] None  ?02/01/2022 10:15 AM Barnet Glasgow,  Norwood Levo, PT [facility] None  ?02/03/2022 10:15 AM Hillis Range, PT [facility] None  ?02/08/2022  9:30 AM Hillis Range, PT [facility] None  ?02/10/2022  9:30 AM Hillis Range, PT [facility] None  ?02/17/2022  9:30 AM Particia Lather, PT [facility] None   ?02/22/2022  9:30 AM Particia Lather, PT [facility] None  ?02/24/2022  9:30 AM Particia Lather, PT [facility] None  ?03/01/2022  8:45 AM Hillis Range, PT [facility] None  ?03/03/2022  9:30 AM Particia Lather, PT [facility] None  ?03/08/2022  8:45 AM Hillis Range, PT [facility] None  ?03/10/2022  8:45 AM Hillis Range, PT [facility] None  ?04/19/2022 10:30 AM Newton Pigg, RPH [facility] None  ?06/08/2022  2:00 PM Unk Pinto, MD [facility] None  ?11/16/2022  2:30 PM Darnelle Derrick, Townsend Roger, NP [facility] None  ? ? ? ?Plan:  ? ?During the course of the visit the patient was educated and counseled about appropriate screening and preventive services including:  ? ?Pneumococcal vaccine  ?Prevnar 13 ?Influenza vaccine ?Td vaccine ?Screening electrocardiogram ?Bone densitometry screening ?Colorectal cancer screening ?Diabetes screening ?Glaucoma screening ?Nutrition counseling  ?Advanced directives: requested ? ? ?Subjective:  ?Rebekah Hamilton [name] is a 76 y.o. female who presents for Medicare Annual Wellness Visit and 3 month follow up.  ? ?She is currently undergoing physical therapy for right hip pain. She is S/P posterior lumbar interbody fusion L3-L4 with revision of fractured rod 01/29/21 and T9-S2 performed 12/23/19. Therapy is starting to help pain, numbness is improving.  Balance continues to be difficult. After surgery she had numbness in right leg and was unable to move leg which was due to nerve being moved with surgery. She goes back to Dr. Harl Bowie 01/2022. ? ?Memory is more impaired since long anesthesia (12 hours) with back surgery.  ? ?She has a diagnosis of recurrent major depression/anxiety related to her divorce which resulted in loss financially. Her mom is 100 and is at her brothers currently, plans to come back next month. Her ex husbands children and grandchildren wont talk to her which is causing more stress. Feels very depressed and not controlled on Duloxetine. Previously did see a  therapist but do not take her insurance.  Denies suicidal/homicidal ideation ? ?Migraines have been a little more frequent. Riztriptan does help.  ? ?Followed by [facility] Con[facility] O[facility] annually for estrogen/progesterone supplementation.  ? ?BMI is Body mass index is 27.97 kg/m?., she has not been working on diet and exercise. ?Wt Readings from Last 3 Encounters:  ?11/15/21 160 lb 6.4 oz (72.8 kg)  ?08/12/21 157 lb 8 oz (71.4 kg)  ?07/15/21 157 lb 8 oz (71.4 kg)  ? ? Her blood pressure has been controlled at home, today their BP is BP: 124/76  ?BP Readings from Last 3 Encounters:  ?11/15/21 124/76  ?08/12/21 (!) 155/93  ?07/15/21 (!) 148/86  ? ? ?She does not workout. She denies chest pain, shortness of breath, dizziness.  ? ?She is on cholesterol medication (pravastatin 40 mg daily) and denies myalgias. Her cholesterol is at goal. The cholesterol last visit was:   ?Lab Results  ?Component Value Date  ? CHOL 130 06/08/2021  ? HDL 45 (L) 06/08/2021  ? Wade 66 06/08/2021  ? TRIG 106 06/08/2021  ? CHOLHDL 2.9 06/08/2021  ? ? She has been working on diet and exercise for glucose management, and denies increased appetite, nausea, paresthesia of the feet, polydipsia, polyuria, visual disturbances and vomiting. Last A1C in the office was:  ?  Lab Results  ?Component Value Date  ? HGBA1C 5.5 06/08/2021  ? ?Last GFR demonstrates labile stage 2/3 CKD, pushes water: ?Lab Results  ?Component Value Date  ? GFRNONAA 54 (L) 11/12/2020  ? GFRNONAA 49 (L) 06/04/2020  ? GFRNONAA 67 02/12/2020  ? ?Patient is on Vitamin D supplement and at goal of 60, taking about 10000-12000 IU daily:    ?Lab Results  ?Component Value Date  ? VD25OH 48 06/08/2021  ?   ? ?Medication Review: ?Current Outpatient Medications on File Prior to Visit  ?Medication Sig Dispense Refill  ? ALPRAZolam (XANAX) 0.25 MG tablet Take 1/2 to 1 tablet 2 to 3 x / day ONLY if needed for Acute Anxiety Attacks  & please try to limit to 5 days / week to avoid addiction 90 tablet 0   ? Ascorbic Acid (VITAMIN C PO) 500 mg 2 (two) times daily.    ? aspirin-acetaminophen-caffeine (EXCEDRIN EXTRA STRENGTH) 250-250-65 MG tablet Take 1 tablet by mouth daily.    ? Cholecalciferol (VITAMIN

## 2021-11-12 ENCOUNTER — Encounter: Payer: Self-pay | Admitting: Physical Therapy

## 2021-11-12 ENCOUNTER — Ambulatory Visit: Payer: Medicare Other | Admitting: Physical Therapy

## 2021-11-12 DIAGNOSIS — M25551 Pain in right hip: Secondary | ICD-10-CM | POA: Diagnosis not present

## 2021-11-12 DIAGNOSIS — R262 Difficulty in walking, not elsewhere classified: Secondary | ICD-10-CM

## 2021-11-12 DIAGNOSIS — M6281 Muscle weakness (generalized): Secondary | ICD-10-CM | POA: Diagnosis not present

## 2021-11-12 DIAGNOSIS — R2681 Unsteadiness on feet: Secondary | ICD-10-CM | POA: Diagnosis not present

## 2021-11-12 NOTE — Therapy (Signed)
[place] [facility] ?[place], [place], [address] ?Phone: [phone]   Fax:  [phone] ? ?Physical Therapy Treatment ? ?Patient Details  ?Name: Rebekah Hamilton ?MRN: [id number] ?Date of Birth: [birth year] ?Referring Provider (PT): Dr. Louanne Skye ? ? ?Encounter Date: 11/12/2021 ? ? PT End of Session - 11/12/21 0836   ? ? Visit Number 12   ? Number of Visits 17   ? Date for PT Re-Evaluation 12/01/21   ? Authorization Type Medicare   ? Progress Note Due on Visit 20   ? PT Start Time 0830   ? PT Stop Time 2725   ? PT Time Calculation (min) 42 min   ? Activity Tolerance Patient tolerated treatment well   ? Behavior During Therapy [facility] for tasks assessed/performed   ? ?  ?  ? ?  ? ? ?Past Medical History:  ?Diagnosis Date  ? Allergy   ? Anemia   ? Anxiety   ? Depression   ? Hyperlipidemia   ? Hypertension   ? Lumbar stenosis   ? Migraines   ? Prediabetes   ? ? ?Past Surgical History:  ?Procedure Laterality Date  ? BREAST LUMPECTOMY Bilateral   ? CHOLECYSTECTOMY  1999  ? SPINE SURGERY    ? ? ?There were no vitals filed for this visit. ? ? Subjective Assessment - 11/12/21 0833   ? ? Subjective Pt reports continued hip pain at this time. 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   ? Pertinent History 76 yo Female reports RLE weakness, imbalance and decreased walking ability. Her PMH is significant:  She is s/p posterior lumbar interbody fusion L3-L4 with revision of fractured rod on 01-29-21 and noted s/p T9-S2 performed on 12-23-19. She reports after 2nd surgery, the surgeon did something to affect the nerve in her right hip; She reports since the surgery she hasn't been back to the surgeon because he is [facility] and that's too far. She has been seeing Dr. Louanne Skye in [place] and she will be following up with him. She reports her pain is intermittent worse with standing/walking and is alleviated with  sitting;she does have a back brace that she will wear as needed (has worn it 1x in last several weeks). She denies any numbness/tingling down RLE. She was given gabapentin but couldn't tolerate it because of memory loss; She also tried steroids which helped a little but it made her jittery; She reports nothing helps at night. She reports frequent sleep disturbances, taking tylenol/excedrin to help manage symptoms; She typically sleeps flat on back, has increased pain in sidelying and is unable to tolerate right sidelying; She reports multiple falls with 2 significant falls in last several months. In addition, she reports significant impairement to memory which she attributes to 2nd surgery and prolonged anesthesia;   ? Limitations Standing;Walking   ? How long can you sit comfortably? as long as needed, but increased pain upon standing;   ? How long can you stand comfortably? 20 min;   ? How long can you walk comfortably? 5-10mn   ? Patient Stated Goals "Get rid of pain."   ? Currently in Pain? Yes   ? Pain Score 5    ? Pain Location Leg   ? Pain Orientation Right   ? Pain Descriptors / Indicators Aching   ? Pain Type Chronic pain   ? Pain Onset More  than a month ago   ? Pain Score 5   ? Pain Location Hip   ? Pain Orientation Right   ? Pain Descriptors / Indicators Aching;Sore   ? Pain Type Chronic pain   ? Pain Onset More than a month ago   ? Pain Frequency Intermittent   ? Aggravating Factors  tender to touch,   ? ?  ?  ? ?  ? ? ? ? ? ? ? ?TREATMENT:THEREX ?-LE apart, lumbar trunk rotation (windshield wiper) x10 reps x 2 second holds on each side  each direction with cues to avoid painful ROM ?single knee to chest 20 sec hold x1 rep each LE ?            - circles clock and counterclockwise x 5 ea  ?            -some pain with clockwise on the L, cues to avoid painful ranges  ?-Crossing LLE over RLE, using LUE to pull RLE over for IT band stretch 5-10 sec hold x2 reps;  ? ?Sidelying:MANUAL ?PT performed soft  tissue massage to right lateral hip and down lateral leg focusing on right gluteal tendon as well as IT band. X10 min, utilized rolling stick for IASTM. ?Ppt reports tenderness and had multiple trigger points along r lateral hip and thigh musculature/ tendons.  ? ?NO CHARGE- Moist heat to R LE to decrease muscle spasm and pain that came when pt in prolonged L SL for STM. X 5 min, when transitioned from SL with heat to supine with heat pt reported symptoms were relieved. Pt was instructed to avoid this position when sleeping due to potential for pain and inflamation exacerbation due to rotational and gravitational forces.  ?Exercise/Activity Sets/ Reps/Time/ Resistance Assistance Charge type Comments  ?      ?      ?      ?      ?Airex step line dance progresion/ practice  10 x ea  CGA Neuro re-ed Good efficacy with no LOB noted.  ?Side step to and from airex pads (1 pad on each side) and step up and back on to airex pads ( 1 airex ant and 1 airex post)  ?- difficulty with retro stepping to airex pad.   ?SLS progression - 1 LE airex and other on 6 inch step  X 30 seconds with ea LE with normal, horizontal head turns and vertical head nods CGA  Neuro re-ed Most difficulty noted with head nods   ?      ?      ?      ?      ?      ?Treatment provided this session ? ? ?Pt educated throughout session about proper posture and technique with exercises. Improved exercise technique, movement at target joints, use of target muscles after min to mod verbal, visual, tactile cues. ?Note: Portions of this document were prepared using Dragon voice recognition software and although reviewed may contain unintentional dictation errors in syntax, grammar, or spelling. ?  ? ?Pt educated throughout session about proper posture and technique with exercises. Improved exercise technique, movement at target joints, use of target muscles after min to mod verbal, visual, tactile cues. ? ? ? ? ? ? ? ? ? ? ? ? ? ? ? ? ? ? ? ? ? ? ? ? ? PT  Education - 11/12/21 0836   ? ? Education Details Exercise form and technique   ?  Person(s) Educated Patient   ? Methods Explanation   ? Comprehension Verbalized understanding   ? ?  ?  ? ?  ? ? ? PT Short Term Goals - 11/05/21 0835   ? ?  ? PT SHORT TERM GOAL #1  ? Title Patient will be adherent to HEP at least 3x a week to improve functional strength and balance for better safety at home.   ? Time 4   ? Period Weeks   ? Status Partially Met   ? Target Date 12/03/21   ?  ? PT SHORT TERM GOAL #2  ? Title Patient will report maximum of 1 sleep disturbance per night for better quality of life and improved sleep tolerance;   ? Baseline sleep disturbed less but this week has been more effected   ? Time 4   ? Period Weeks   ? Status On-going   ? Target Date 12/03/21   ? ?  ?  ? ?  ? ? ? ? PT Long Term Goals - 11/05/21 0838   ? ?  ? PT LONG TERM GOAL #1  ? Title Patient will increase Functional Gait Assessment score to >20/30 as to reduce fall risk and improve dynamic gait safety with community ambulation.   ? Time 8   ? Period Weeks   ? Status Achieved   ? Target Date 12/01/21   ?  ? PT LONG TERM GOAL #2  ? Title Patient will increase RLE gross strength to 4+/5 as to improve functional strength for independent gait, increased standing tolerance and increased ADL ability.   ? Baseline 4/5 with hip flexion and abduction   ? Time 8   ? Period Weeks   ? Status Partially Met   ? Target Date 12/01/21   ?  ? PT LONG TERM GOAL #3  ? Title Patient will report a worst pain of 3/10 on VAS in     right hip        to improve tolerance with ADLs and reduced symptoms with activities.   ? Baseline 5-6/10 in hip this week but had a decrease in pain for a while.   ? Time 8   ? Period Weeks   ? Status Not Met   ? Target Date 12/01/21   ?  ? PT LONG TERM GOAL #4  ? Title Patient will increase six minute walk test distance to >1000 feet without increase in right hip pain for progression to community ambulator and improve gait ability   ?  Time 8   ? Period Weeks   ? Status Achieved   ? Target Date 12/01/21   ?  ? PT LONG TERM GOAL #5  ? Title Patient will improve FOTO score by 5% to indicate improved functional mobility with ADLs.   ? Baseline FOTO from 48

## 2021-11-15 ENCOUNTER — Encounter: Payer: Self-pay | Admitting: Nurse Practitioner

## 2021-11-15 ENCOUNTER — Ambulatory Visit (INDEPENDENT_AMBULATORY_CARE_PROVIDER_SITE_OTHER): Payer: Medicare Other | Admitting: Nurse Practitioner

## 2021-11-15 ENCOUNTER — Ambulatory Visit: Payer: Medicare Other | Admitting: Adult Health

## 2021-11-15 VITALS — BP 124/76 | HR 61 | Temp 97.7°F | Wt 160.4 lb

## 2021-11-15 DIAGNOSIS — I872 Venous insufficiency (chronic) (peripheral): Secondary | ICD-10-CM

## 2021-11-15 DIAGNOSIS — G43009 Migraine without aura, not intractable, without status migrainosus: Secondary | ICD-10-CM

## 2021-11-15 DIAGNOSIS — M7061 Trochanteric bursitis, right hip: Secondary | ICD-10-CM

## 2021-11-15 DIAGNOSIS — N3281 Overactive bladder: Secondary | ICD-10-CM

## 2021-11-15 DIAGNOSIS — Z0001 Encounter for general adult medical examination with abnormal findings: Secondary | ICD-10-CM | POA: Diagnosis not present

## 2021-11-15 DIAGNOSIS — F331 Major depressive disorder, recurrent, moderate: Secondary | ICD-10-CM | POA: Diagnosis not present

## 2021-11-15 DIAGNOSIS — Z1211 Encounter for screening for malignant neoplasm of colon: Secondary | ICD-10-CM

## 2021-11-15 DIAGNOSIS — N182 Chronic kidney disease, stage 2 (mild): Secondary | ICD-10-CM

## 2021-11-15 DIAGNOSIS — M48062 Spinal stenosis, lumbar region with neurogenic claudication: Secondary | ICD-10-CM

## 2021-11-15 DIAGNOSIS — T7840XS Allergy, unspecified, sequela: Secondary | ICD-10-CM

## 2021-11-15 DIAGNOSIS — Z0000 Encounter for general adult medical examination without abnormal findings: Secondary | ICD-10-CM

## 2021-11-15 DIAGNOSIS — R7309 Other abnormal glucose: Secondary | ICD-10-CM

## 2021-11-15 DIAGNOSIS — H409 Unspecified glaucoma: Secondary | ICD-10-CM

## 2021-11-15 DIAGNOSIS — I10 Essential (primary) hypertension: Secondary | ICD-10-CM | POA: Diagnosis not present

## 2021-11-15 DIAGNOSIS — E559 Vitamin D deficiency, unspecified: Secondary | ICD-10-CM | POA: Diagnosis not present

## 2021-11-15 DIAGNOSIS — E782 Mixed hyperlipidemia: Secondary | ICD-10-CM | POA: Diagnosis not present

## 2021-11-15 DIAGNOSIS — R6889 Other general symptoms and signs: Secondary | ICD-10-CM

## 2021-11-15 DIAGNOSIS — Z79899 Other long term (current) drug therapy: Secondary | ICD-10-CM

## 2021-11-15 MED ORDER — RIZATRIPTAN BENZOATE 10 MG PO TABS: ORAL_TABLET | ORAL | 3 refills | Status: DC

## 2021-11-15 MED ORDER — VORTIOXETINE HBR 5 MG PO TABS: 5.0000 mg | ORAL_TABLET | Freq: Every day | ORAL | 3 refills | Status: DC

## 2021-11-15 MED ORDER — DULOXETINE HCL 30 MG PO CPEP: ORAL_CAPSULE | ORAL | 3 refills | Status: DC

## 2021-11-15 MED ORDER — HYDROCHLOROTHIAZIDE 25 MG PO TABS: ORAL_TABLET | ORAL | 1 refills | Status: DC

## 2021-11-16 ENCOUNTER — Encounter: Payer: Self-pay | Admitting: Physical Therapy

## 2021-11-16 ENCOUNTER — Ambulatory Visit: Payer: Medicare Other | Attending: Specialist | Admitting: Physical Therapy

## 2021-11-16 DIAGNOSIS — R262 Difficulty in walking, not elsewhere classified: Secondary | ICD-10-CM | POA: Diagnosis not present

## 2021-11-16 DIAGNOSIS — R2681 Unsteadiness on feet: Secondary | ICD-10-CM | POA: Insufficient documentation

## 2021-11-16 DIAGNOSIS — M25551 Pain in right hip: Secondary | ICD-10-CM | POA: Insufficient documentation

## 2021-11-16 DIAGNOSIS — M6281 Muscle weakness (generalized): Secondary | ICD-10-CM | POA: Insufficient documentation

## 2021-11-16 LAB — CBC WITH DIFFERENTIAL/PLATELET
Absolute Monocytes: 736 cells/uL (ref 200–950)
Basophils Absolute: 64 cells/uL (ref 0–200)
Basophils Relative: 0.7 %
Eosinophils Absolute: 212 cells/uL (ref 15–500)
Eosinophils Relative: 2.3 %
HCT: 41.5 % (ref 35.0–45.0)
Hemoglobin: 13.8 g/dL (ref 11.7–15.5)
Lymphs Abs: 2254 cells/uL (ref 850–3900)
MCH: 30.5 pg (ref 27.0–33.0)
MCHC: 33.3 g/dL (ref 32.0–36.0)
MCV: 91.6 fL (ref 80.0–100.0)
MPV: 10 fL (ref 7.5–12.5)
Monocytes Relative: 8 %
Neutro Abs: 5934 cells/uL (ref 1500–7800)
Neutrophils Relative %: 64.5 %
Platelets: 315 10*3/uL (ref 140–400)
RBC: 4.53 10*6/uL (ref 3.80–5.10)
RDW: 12.2 % (ref 11.0–15.0)
Total Lymphocyte: 24.5 %
WBC: 9.2 10*3/uL (ref 3.8–10.8)

## 2021-11-16 LAB — COMPLETE METABOLIC PANEL WITH GFR
AG Ratio: 1.9 (calc) (ref 1.0–2.5)
ALT: 15 U/L (ref 6–29)
AST: 22 U/L (ref 10–35)
Albumin: 4.3 g/dL (ref 3.6–5.1)
Alkaline phosphatase (APISO): 60 U/L (ref 37–153)
BUN/Creatinine Ratio: 13 (calc) (ref 6–22)
BUN: 14 mg/dL (ref 7–25)
CO2: 27 mmol/L (ref 20–32)
Calcium: 9.8 mg/dL (ref 8.6–10.4)
Chloride: 107 mmol/L (ref 98–110)
Creat: 1.04 mg/dL — ABNORMAL HIGH (ref 0.60–1.00)
Globulin: 2.3 g/dL (calc) (ref 1.9–3.7)
Glucose, Bld: 82 mg/dL (ref 65–99)
Potassium: 4.9 mmol/L (ref 3.5–5.3)
Sodium: 145 mmol/L (ref 135–146)
Total Bilirubin: 0.4 mg/dL (ref 0.2–1.2)
Total Protein: 6.6 g/dL (ref 6.1–8.1)
eGFR: 56 mL/min/{1.73_m2} — ABNORMAL LOW (ref >=60)

## 2021-11-16 LAB — HEMOGLOBIN A1C
Hgb A1c MFr Bld: 5.8 % of total Hgb — ABNORMAL HIGH (ref <5.7)
Mean Plasma Glucose: 120 mg/dL
eAG (mmol/L): 6.6 mmol/L

## 2021-11-16 LAB — LIPID PANEL
Cholesterol: 189 mg/dL (ref <200)
HDL: 49 mg/dL — ABNORMAL LOW (ref >=50)
LDL Cholesterol (Calc): 115 mg/dL (calc) — ABNORMAL HIGH
Non-HDL Cholesterol (Calc): 140 mg/dL (calc) — ABNORMAL HIGH (ref <130)
Total CHOL/HDL Ratio: 3.9 (calc) (ref <5.0)
Triglycerides: 141 mg/dL (ref <150)

## 2021-11-16 NOTE — Therapy (Signed)
[place] [facility] ?[place], [place], [address] ?Phone: [phone]   Fax:  [phone] ? ?Physical Therapy Treatment ? ?Patient Details  ?Name: Rebekah Hamilton ?MRN: [id number] ?Date of Birth: 1945-09-13 ?Referring Provider (PT): Dr. Louanne Skye ? ? ?Encounter Date: 11/16/2021 ? ? PT End of Session - 11/16/21 1107   ? ? Visit Number 13   ? Number of Visits 17   ? Date for PT Re-Evaluation 12/01/21   ? Authorization Type Medicare   ? Progress Note Due on Visit 20   ? PT Start Time 1100   ? PT Stop Time 1140   ? PT Time Calculation (min) 40 min   ? Activity Tolerance Patient tolerated treatment well   ? Behavior During Therapy [facility] for tasks assessed/performed   ? ?  ?  ? ?  ? ? ?Past Medical History:  ?Diagnosis Date  ? Allergy   ? Anemia   ? Anxiety   ? Depression   ? Hyperlipidemia   ? Hypertension   ? Lumbar stenosis   ? Migraines   ? Prediabetes   ? ? ?Past Surgical History:  ?Procedure Laterality Date  ? BREAST LUMPECTOMY Bilateral   ? CHOLECYSTECTOMY  1999  ? SPINE SURGERY    ? ? ?There were no vitals filed for this visit. ? ? Subjective Assessment - 11/16/21 1105   ? ? Subjective Patient reports getting a new medication yesterday for depression/migraine and states she has been more fatigued today. "I just don't feel well."   ? Pertinent History 76 yo Female reports RLE weakness, imbalance and decreased walking ability. Her PMH is significant:  She is s/p posterior lumbar interbody fusion L3-L4 with revision of fractured rod on 01-29-21 and noted s/p T9-S2 performed on 12-23-19. She reports after 2nd surgery, the surgeon did something to affect the nerve in her right hip; She reports since the surgery she hasn't been back to the surgeon because he is [facility] and that's too far. She has been seeing Dr. Louanne Skye in [place] and she will be following up with him. She reports her pain is intermittent worse with standing/walking and is alleviated with  sitting;she does have a back brace that she will wear as needed (has worn it 1x in last several weeks). She denies any numbness/tingling down RLE. She was given gabapentin but couldn't tolerate it because of memory loss; She also tried steroids which helped a little but it made her jittery; She reports nothing helps at night. She reports frequent sleep disturbances, taking tylenol/excedrin to help manage symptoms; She typically sleeps flat on back, has increased pain in sidelying and is unable to tolerate right sidelying; She reports multiple falls with 2 significant falls in last several months. In addition, she reports significant impairement to memory which she attributes to 2nd surgery and prolonged anesthesia;   ? Limitations Standing;Walking   ? How long can you sit comfortably? as long as needed, but increased pain upon standing;   ? How long can you stand comfortably? 20 min;   ? How long can you walk comfortably? 5-28mn   ? Patient Stated Goals "Get rid of pain."   ? Currently in Pain? Yes   ? Pain Score 3    ? Pain Location Back   ? Pain Orientation Right   ? Pain Descriptors / Indicators Aching;Sore   ? Pain Type Chronic pain   ? Pain Onset More than a month ago   ?  Pain Frequency Intermittent   ? Aggravating Factors  sleeping at night   ? Pain Relieving Factors PT, stretching   ? Effect of Pain on Daily Activities decreased activity tolerance;   ? Multiple Pain Sites No   ? Pain Onset More than a month ago   ? ?  ?  ? ?  ? ? ? ?  ?  ?  ?TREATMENT: ?Hooklying with moist heat to low back concurrent with hooklying exercise ?-LE apart, lumbar trunk rotation (windshield wiper) x10 reps x 2 second holds on each side  each direction with cues to avoid painful ROM ?Manual A with following stretches:  ?single knee to chest 20 sec hold x1 rep each LE ?            - circles clock and counterclockwise x 5 ea  ?            -some pain with clockwise on the L, cues to avoid painful ranges  ?-single leg crossed over  contralateral LE, hip adduction/flexion for IT band/piriformis stretch x5 reps ?-Crossing LLE over RLE, using LUE to pull RLE over for IT band stretch 15 sec hold x2 reps;  ?   ?Hooklying: ?Posterior pelvic tilt 5 sec hold x5 reps with cues for full ROM in lumbar spine ?Posterior pelvic tilt with BLE leg lift 90/90, 5 sec hold x5 reps, with 2# ankle weight moderate difficulty ?SLR hip flexion 2# x12 reps each LE, moderate difficulty; ?BLE hip abduction/ER green tband x15 reps;  ?Bridges with legs apart, green tband around BLE to challenge gluteal activation x15 reps with cues for proper positioning;  ?Suspended heel slides 2# single leg x10 reps each LE with moderate difficulty reported;  ?  ?Sidelying: ?PT performed soft tissue massage to right lateral hip and down lateral leg focusing on right gluteal tendon as well as IT band. X10 min with rolling stick. Identified trigger point along IT band, PT performed ischemic trigger point release 30 sec hold x2 sets; ?PT utilized rolling stick along right quad with moderate tenderness reported ? ?PT performed passive quad stretch 20 sec hold x2 reps each LE with moderate tightness reported ?Educated patient on how to obtain rolling stick at home for pain management;  ?  ?Pt educated throughout session about proper posture and technique with exercises. Improved exercise technique, movement at target joints, use of target muscles after min to mod verbal, visual, tactile cues. ?  ?  ?She reports less soreness at end of session.  ?  ?  ?  ?  ? ? ? ? ? ? ? ? ? ? ? ? ? ? ? ? ? ? ? ? ? ? ? ? ? PT Education - 11/16/21 1107   ? ? Education Details exercise positioning/technique   ? Person(s) Educated Patient   ? Methods Explanation;Verbal cues   ? Comprehension Verbalized understanding;Returned demonstration;Verbal cues required;Need further instruction   ? ?  ?  ? ?  ? ? ? PT Short Term Goals - 11/05/21 0835   ? ?  ? PT SHORT TERM GOAL #1  ? Title Patient will be adherent to HEP  at least 3x a week to improve functional strength and balance for better safety at home.   ? Time 4   ? Period Weeks   ? Status Partially Met   ? Target Date 12/03/21   ?  ? PT SHORT TERM GOAL #2  ? Title Patient will report maximum of 1 sleep disturbance per  night for better quality of life and improved sleep tolerance;   ? Baseline sleep disturbed less but this week has been more effected   ? Time 4   ? Period Weeks   ? Status On-going   ? Target Date 12/03/21   ? ?  ?  ? ?  ? ? ? ? PT Long Term Goals - 11/05/21 0838   ? ?  ? PT LONG TERM GOAL #1  ? Title Patient will increase Functional Gait Assessment score to >20/30 as to reduce fall risk and improve dynamic gait safety with community ambulation.   ? Time 8   ? Period Weeks   ? Status Achieved   ? Target Date 12/01/21   ?  ? PT LONG TERM GOAL #2  ? Title Patient will increase RLE gross strength to 4+/5 as to improve functional strength for independent gait, increased standing tolerance and increased ADL ability.   ? Baseline 4/5 with hip flexion and abduction   ? Time 8   ? Period Weeks   ? Status Partially Met   ? Target Date 12/01/21   ?  ? PT LONG TERM GOAL #3  ? Title Patient will report a worst pain of 3/10 on VAS in     right hip        to improve tolerance with ADLs and reduced symptoms with activities.   ? Baseline 5-6/10 in hip this week but had a decrease in pain for a while.   ? Time 8   ? Period Weeks   ? Status Not Met   ? Target Date 12/01/21   ?  ? PT LONG TERM GOAL #4  ? Title Patient will increase six minute walk test distance to >1000 feet without increase in right hip pain for progression to community ambulator and improve gait ability   ? Time 8   ? Period Weeks   ? Status Achieved   ? Target Date 12/01/21   ?  ? PT LONG TERM GOAL #5  ? Title Patient will improve FOTO score by 5% to indicate improved functional mobility with ADLs.   ? Baseline FOTO from 48 at IE to 53 on 11/05/21   ? Time 8   ? Period Weeks   ? Status Achieved   ? Target Date  12/01/21   ?  ? Additional Long Term Goals  ? Additional Long Term Goals Yes   ?  ? PT LONG TERM GOAL #6  ? Title Patient will improve functional gait assessment score to 26 out of 30 or better in order

## 2021-11-18 ENCOUNTER — Ambulatory Visit: Payer: Medicare Other | Admitting: Physical Therapy

## 2021-11-18 DIAGNOSIS — R2681 Unsteadiness on feet: Secondary | ICD-10-CM

## 2021-11-18 DIAGNOSIS — M25551 Pain in right hip: Secondary | ICD-10-CM | POA: Diagnosis not present

## 2021-11-18 DIAGNOSIS — M6281 Muscle weakness (generalized): Secondary | ICD-10-CM | POA: Diagnosis not present

## 2021-11-18 DIAGNOSIS — R262 Difficulty in walking, not elsewhere classified: Secondary | ICD-10-CM

## 2021-11-18 NOTE — Therapy (Signed)
[facility] ?[place], [place], [address] ?Phone: [phone]   Fax:  [phone] ? ?Physical Therapy Treatment ? ?Patient Details  ?Name: Rebekah Hamilton [name] ?MRN: [id number] ?Date of Birth: 1946-01-07 ?Referring Provider (PT): Dr. Louanne Skye ? ? ?Encounter Date: 11/18/2021 ? ? PT End of Session - 11/18/21 1155   ? ? Visit Number 14   ? Number of Visits 17   ? Date for PT Re-Evaluation 12/01/21   ? Authorization Type Medicare   ? Progress Note Due on Visit 20   ? PT Start Time 1147   ? PT Stop Time 1230   ? PT Time Calculation (min) 43 min   ? Activity Tolerance Patient tolerated treatment well   ? Behavior During Therapy [facility] for tasks assessed/performed   ? ?  ?  ? ?  ? ? ?Past Medical History:  ?Diagnosis Date  ? Allergy   ? Anemia   ? Anxiety   ? Depression   ? Hyperlipidemia   ? Hypertension   ? Lumbar stenosis   ? Migraines   ? Prediabetes   ? ? ?Past Surgical History:  ?Procedure Laterality Date  ? BREAST LUMPECTOMY Bilateral   ? CHOLECYSTECTOMY  1999  ? SPINE SURGERY    ? ? ?There were no vitals filed for this visit. ? ? Subjective Assessment - 11/18/21 1153   ? ? Subjective Pt reporting no new complaints, but still experiencing some soreness in the R thigh after manual therapy at last visit.   ? Pertinent History 76 yo Female reports RLE weakness, imbalance and decreased walking ability. Her PMH is significant:  She is s/p posterior lumbar interbody fusion L3-L4 with revision of fractured rod on 01-29-21 and noted s/p T9-S2 performed on 12-23-19. She reports after 2nd surgery, the surgeon did something to affect the nerve in her right hip; She reports since the surgery she hasn't been back to the surgeon because he is [facility] and that's too far. She has been seeing Dr. Louanne Skye in [place] and she will be following up with him. She reports her pain is intermittent worse with standing/walking and is alleviated with sitting;she does have a  back brace that she will wear as needed (has worn it 1x in last several weeks). She denies any numbness/tingling down RLE. She was given gabapentin but couldn't tolerate it because of memory loss; She also tried steroids which helped a little but it made her jittery; She reports nothing helps at night. She reports frequent sleep disturbances, taking tylenol/excedrin to help manage symptoms; She typically sleeps flat on back, has increased pain in sidelying and is unable to tolerate right sidelying; She reports multiple falls with 2 significant falls in last several months. In addition, she reports significant impairement to memory which she attributes to 2nd surgery and prolonged anesthesia;   ? Limitations Standing;Walking   ? How long can you sit comfortably? as long as needed, but increased pain upon standing;   ? How long can you stand comfortably? 20 min;   ? How long can you walk comfortably? 5-74mn   ? Patient Stated Goals "Get rid of pain."   ? Currently in Pain? Yes   ? Pain Score 2    ? Pain Location Hip   ? Pain Orientation Right   ? Pain Descriptors / Indicators Aching;Sore   ? Pain Type Chronic pain   ? Pain Onset More than a month ago   ? Pain  Onset More than a month ago   ? ?  ?  ? ?  ? ? ? ? ? ?TREATMENT ? ?Hooklying with moist heat to low back concurrent with hooklying exercise ? ? ?Manual: ? ?Hooklying hamstring stretch with overpressure applied by therapist for increased muscle extensibility, 30 sec bouts, multiple bouts, each LE ?Hooklying piriformis stretch with overpressure applied by therapist for increased muscle extensibility, 30 sec bouts, multiple bouts, each LE ?Hooklying bent-knee hamstring stretch with overpressure applied by therapist for increased muscle extensibility, 30 sec bouts, multiple bouts, each LE ?Supine ITB stretch with overpressure applied by therapist, 30 sec bouts x mutliple outs each LE ?STM with ischemic TP release to the ITB with pt noting discomfort when performing;  pt educated on dry needling technique and pt will be thinking about potential for increased mobility ? ? ?TherEx: ? ?Hooklying posterior pelvic tilt 5 sec hold x5 reps with cues for full ROM in lumbar spine ?Posterior pelvic tilt with BLE leg lift 90/90, 5 sec hold x5 reps ?SLR hip flexion 2# x12 reps each LE, moderate difficulty; ?Supine hip adduction into physioball, 2x15 ? ? ? ? ? ? ? ? ? PT Short Term Goals - 11/05/21 0835   ? ?  ? PT SHORT TERM GOAL #1  ? Title Patient will be adherent to HEP at least 3x a week to improve functional strength and balance for better safety at home.   ? Time 4   ? Period Weeks   ? Status Partially Met   ? Target Date 12/03/21   ?  ? PT SHORT TERM GOAL #2  ? Title Patient will report maximum of 1 sleep disturbance per night for better quality of life and improved sleep tolerance;   ? Baseline sleep disturbed less but this week has been more effected   ? Time 4   ? Period Weeks   ? Status On-going   ? Target Date 12/03/21   ? ?  ?  ? ?  ? ? ? ? PT Long Term Goals - 11/05/21 0838   ? ?  ? PT LONG TERM GOAL #1  ? Title Patient will increase Functional Gait Assessment score to >20/30 as to reduce fall risk and improve dynamic gait safety with community ambulation.   ? Time 8   ? Period Weeks   ? Status Achieved   ? Target Date 12/01/21   ?  ? PT LONG TERM GOAL #2  ? Title Patient will increase RLE gross strength to 4+/5 as to improve functional strength for independent gait, increased standing tolerance and increased ADL ability.   ? Baseline 4/5 with hip flexion and abduction   ? Time 8   ? Period Weeks   ? Status Partially Met   ? Target Date 12/01/21   ?  ? PT LONG TERM GOAL #3  ? Title Patient will report a worst pain of 3/10 on VAS in     right hip        to improve tolerance with ADLs and reduced symptoms with activities.   ? Baseline 5-6/10 in hip this week but had a decrease in pain for a while.   ? Time 8   ? Period Weeks   ? Status Not Met   ? Target Date 12/01/21   ?  ? PT  LONG TERM GOAL #4  ? Title Patient will increase six minute walk test distance to >1000 feet without increase in right hip pain for progression  to community ambulator and improve gait ability   ? Time 8   ? Period Weeks   ? Status Achieved   ? Target Date 12/01/21   ?  ? PT LONG TERM GOAL #5  ? Title Patient will improve FOTO score by 5% to indicate improved functional mobility with ADLs.   ? Baseline FOTO from 48 at IE to 53 on 11/05/21   ? Time 8   ? Period Weeks   ? Status Achieved   ? Target Date 12/01/21   ?  ? Additional Long Term Goals  ? Additional Long Term Goals Yes   ?  ? PT LONG TERM GOAL #6  ? Title Patient will improve functional gait assessment score to 26 out of 30 or better in order to improve her fall risk as well as improve her functional mobility   ? Baseline 23 of 30 on 11/05/2021   ? Time 6   ? Period Weeks   ? Status New   ? Target Date 12/17/21   ?  ? PT LONG TERM GOAL #7  ? Title Patient will report and demonstrate ability to complete line dancing activities in order to improve her ability to participate in recreational community activities and exercise   ? Baseline Patient not competent with several [place] activities and feels her right lower extremity gives way at times   ? Time 6   ? Period Weeks   ? Status New   ? Target Date 12/17/21   ? ?  ?  ? ?  ? ? ? ? ? ? ? ? Plan - 11/18/21 1620   ? ? Personal Factors and Comorbidities Comorbidity 3+;Past/Current Experience;Time since onset of injury/illness/exacerbation   ? Comorbidities HTN(variable), pre-diabetic, past spinal fusion and revision, anxiety/depression (somewhat controlled), recent falls   ? Examination-Activity Limitations Bed Mobility;Locomotion Level;Sleep;Squat;Stairs;Stand;Transfers   ? Examination-Participation Restrictions Cleaning;Community Activity;Shop;Volunteer;[employer] Work   ? Stability/Clinical Decision Making Evolving/Moderate complexity   ? Rehab Potential Fair   ? PT Frequency 2x / week   ? PT Duration 8 weeks   ? PT  Treatment/Interventions ADLs/Self Care Home Management;Cryotherapy;Electrical Stimulation;Moist Heat;Gait training;Stair training;Functional mobility training;Therapeutic activities;Therapeutic exercise;B

## 2021-11-23 ENCOUNTER — Encounter: Payer: Self-pay | Admitting: Physical Therapy

## 2021-11-23 ENCOUNTER — Ambulatory Visit: Payer: Medicare Other | Admitting: Physical Therapy

## 2021-11-23 DIAGNOSIS — M6281 Muscle weakness (generalized): Secondary | ICD-10-CM | POA: Diagnosis not present

## 2021-11-23 DIAGNOSIS — M25551 Pain in right hip: Secondary | ICD-10-CM | POA: Diagnosis not present

## 2021-11-23 DIAGNOSIS — R2681 Unsteadiness on feet: Secondary | ICD-10-CM

## 2021-11-23 DIAGNOSIS — R262 Difficulty in walking, not elsewhere classified: Secondary | ICD-10-CM | POA: Diagnosis not present

## 2021-11-23 NOTE — Therapy (Signed)
[facility] ?[place], [place], [address] ?Phone: [phone]   Fax:  [phone] ? ?Physical Therapy Treatment ? ?Patient Details  ?Name: Rebekah Hamilton [name] ?MRN: [id number] ?Date of Birth: [birth year] ?Referring Provider (PT): Dr. Louanne Skye ? ? ?Encounter Date: 11/23/2021 ? ? PT End of Session - 11/23/21 1102   ? ? Visit Number 15   ? Number of Visits 17   ? Date for PT Re-Evaluation 12/01/21   ? Authorization Type Medicare   ? Progress Note Due on Visit 20   ? PT Start Time 1048   ? PT Stop Time 1130   ? PT Time Calculation (min) 42 min   ? Activity Tolerance Patient tolerated treatment well   ? Behavior During Therapy [facility] for tasks assessed/performed   ? ?  ?  ? ?  ? ? ?Past Medical History:  ?Diagnosis Date  ? Allergy   ? Anemia   ? Anxiety   ? Depression   ? Hyperlipidemia   ? Hypertension   ? Lumbar stenosis   ? Migraines   ? Prediabetes   ? ? ?Past Surgical History:  ?Procedure Laterality Date  ? BREAST LUMPECTOMY Bilateral   ? CHOLECYSTECTOMY  1999  ? SPINE SURGERY    ? ? ?There were no vitals filed for this visit. ? ? Subjective Assessment - 11/23/21 1056   ? ? Subjective Patient reports increased RLE hip pain. 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 She is not sure if that is what is making the hip worse or if its something else;   ? Pertinent History 76 yo Female reports RLE weakness, imbalance and decreased walking ability. Her PMH is significant:  She is s/p posterior lumbar interbody fusion L3-L4 with revision of fractured rod on 01-29-21 and noted s/p T9-S2 performed on 12-23-19. She reports after 2nd surgery, the surgeon did something to affect the nerve in her right hip; She reports since the surgery she hasn't been back to the surgeon because he is [facility] and that's too far. She has been seeing Dr. Louanne Skye in [place] and she will be following up with him. She reports her pain is intermittent worse  with standing/walking and is alleviated with sitting;she does have a back brace that she will wear as needed (has worn it 1x in last several weeks). She denies any numbness/tingling down RLE. She was given gabapentin but couldn't tolerate it because of memory loss; She also tried steroids which helped a little but it made her jittery; She reports nothing helps at night. She reports frequent sleep disturbances, taking tylenol/excedrin to help manage symptoms; She typically sleeps flat on back, has increased pain in sidelying and is unable to tolerate right sidelying; She reports multiple falls with 2 significant falls in last several months. In addition, she reports significant impairement to memory which she attributes to 2nd surgery and prolonged anesthesia;   ? Limitations Standing;Walking   ? How long can you sit comfortably? as long as needed, but increased pain upon standing;   ? How long can you stand comfortably? 20 min;   ? How long can you walk comfortably? 5-64mn   ? Patient Stated Goals "Get rid of pain."   ? Currently in Pain? Yes   ? Pain Score 5    ? Pain Location Hip   ? Pain Orientation Right   ? Pain Descriptors / Indicators Aching;Sore   ?  Pain Type Chronic pain   ? Pain Radiating Towards radiates down lateral hip/leg;   ? Pain Onset More than a month ago   ? Pain Frequency Intermittent   ? Aggravating Factors  increased walking, sleeping at night   ? Pain Relieving Factors stretches/heat   ? Effect of Pain on Daily Activities decreased activity tolerance;   ? Pain Onset More than a month ago   ? ?  ?  ? ?  ? ? ? ? ? ? ? ? ?  ?TREATMENT: ?Sidelying: ?PT applied moist heat to right hip; ? ?PT performed soft tissue massage to right lateral hip and down lateral leg focusing on right gluteal tendon as well as IT band. X10 min with rolling stick.  ?  ?PT applied TENs to right hip modulated II setting, at tolerated intensity #4-8 concurrent with hooklying exercise:  ? ?Hooklying with moist heat to low  back concurrent with hooklying exercise ?-LE apart, lumbar trunk rotation (windshield wiper) x10 reps x 2 second holds on each side  each direction with cues to avoid painful ROM ?Manual A with following stretches:  ?single knee to chest 20 sec hold x1 rep each LE ?            - circles clock and counterclockwise x 5 ea  ?            -some pain with clockwise on the L, cues to avoid painful ranges  ?-single leg crossed over contralateral LE, hip adduction/flexion for IT band/piriformis stretch x5 reps ? ?   ?Hooklying: ?Posterior pelvic tilt 5 sec hold x5 reps with cues for full ROM in lumbar spine ? ?Pt educated throughout session about proper posture and technique with exercises. Improved exercise technique, movement at target joints, use of target muscles after min to mod verbal, visual, tactile cues. ?  ?  ?She reports less soreness at end of session.  ? ?Educated patient on how she can obtain a home TENs unit for proper pain management;  ? ? ? ? ? ? ? ? ? ? ? ? ? ? ? ? ? ? PT Education - 11/23/21 1102   ? ? Education Details exercise positioning/technique   ? Person(s) Educated Patient   ? Methods Explanation;Verbal cues   ? Comprehension Verbalized understanding;Returned demonstration;Verbal cues required;Need further instruction   ? ?  ?  ? ?  ? ? ? PT Short Term Goals - 11/05/21 0835   ? ?  ? PT SHORT TERM GOAL #1  ? Title Patient will be adherent to HEP at least 3x a week to improve functional strength and balance for better safety at home.   ? Time 4   ? Period Weeks   ? Status Partially Met   ? Target Date 12/03/21   ?  ? PT SHORT TERM GOAL #2  ? Title Patient will report maximum of 1 sleep disturbance per night for better quality of life and improved sleep tolerance;   ? Baseline sleep disturbed less but this week has been more effected   ? Time 4   ? Period Weeks   ? Status On-going   ? Target Date 12/03/21   ? ?  ?  ? ?  ? ? ? ? PT Long Term Goals - 11/05/21 0838   ? ?  ? PT LONG TERM GOAL #1  ? Title  Patient will increase Functional Gait Assessment score to >20/30 as to reduce fall risk and improve dynamic gait safety  with community ambulation.   ? Time 8   ? Period Weeks   ? Status Achieved   ? Target Date 12/01/21   ?  ? PT LONG TERM GOAL #2  ? Title Patient will increase RLE gross strength to 4+/5 as to improve functional strength for independent gait, increased standing tolerance and increased ADL ability.   ? Baseline 4/5 with hip flexion and abduction   ? Time 8   ? Period Weeks   ? Status Partially Met   ? Target Date 12/01/21   ?  ? PT LONG TERM GOAL #3  ? Title Patient will report a worst pain of 3/10 on VAS in     right hip        to improve tolerance with ADLs and reduced symptoms with activities.   ? Baseline 5-6/10 in hip this week but had a decrease in pain for a while.   ? Time 8   ? Period Weeks   ? Status Not Met   ? Target Date 12/01/21   ?  ? PT LONG TERM GOAL #4  ? Title Patient will increase six minute walk test distance to >1000 feet without increase in right hip pain for progression to community ambulator and improve gait ability   ? Time 8   ? Period Weeks   ? Status Achieved   ? Target Date 12/01/21   ?  ? PT LONG TERM GOAL #5  ? Title Patient will improve FOTO score by 5% to indicate improved functional mobility with ADLs.   ? Baseline FOTO from 48 at IE to 53 on 11/05/21   ? Time 8   ? Period Weeks   ? Status Achieved   ? Target Date 12/01/21   ?  ? Additional Long Term Goals  ? Additional Long Term Goals Yes   ?  ? PT LONG TERM GOAL #6  ? Title Patient will improve functional gait assessment score to 26 out of 30 or better in order to improve her fall risk as well as improve her functional mobility   ? Baseline 23 of 30 on 11/05/2021   ? Time 6   ? Period Weeks   ? Status New   ? Target Date 12/17/21   ?  ? PT LONG TERM GOAL #7  ? Title Patient will report and demonstrate ability to complete line dancing activities in order to improve her ability to participate in recreational community  activities and exercise   ? Baseline Patient not competent with several [place] activities and feels her right lower extremity gives way at times   ? Time 6   ? Period Weeks   ? Status New   ? Target

## 2021-11-26 ENCOUNTER — Ambulatory Visit: Payer: Medicare Other | Admitting: Physical Therapy

## 2021-11-26 DIAGNOSIS — M25551 Pain in right hip: Secondary | ICD-10-CM

## 2021-11-26 DIAGNOSIS — M6281 Muscle weakness (generalized): Secondary | ICD-10-CM | POA: Diagnosis not present

## 2021-11-26 DIAGNOSIS — R262 Difficulty in walking, not elsewhere classified: Secondary | ICD-10-CM

## 2021-11-26 DIAGNOSIS — R2681 Unsteadiness on feet: Secondary | ICD-10-CM | POA: Diagnosis not present

## 2021-11-26 NOTE — Therapy (Signed)
[facility] ?[place], [place], [address] ?Phone: [phone]   Fax:  [phone] ? ?Physical Therapy Treatment ? ?Patient Details  ?Name: Bennetta Rudden [name] ?MRN: [id number] ?Date of Birth: Jan 24, 1946 ?Referring Provider (PT): Dr. Louanne Skye ? ? ?Encounter Date: 11/26/2021 ? ? PT End of Session - 11/26/21 1031   ? ? Visit Number 16   ? Number of Visits 17   ? Date for PT Re-Evaluation 12/01/21   ? Authorization Type Medicare   ? Progress Note Due on Visit 20   ? PT Start Time [phone]   ? PT Stop Time 9518   ? PT Time Calculation (min) 40 min   ? Activity Tolerance Patient tolerated treatment well   ? Behavior During Therapy [facility] for tasks assessed/performed   ? ?  ?  ? ?  ? ? ?Past Medical History:  ?Diagnosis Date  ? Allergy   ? Anemia   ? Anxiety   ? Depression   ? Hyperlipidemia   ? Hypertension   ? Lumbar stenosis   ? Migraines   ? Prediabetes   ? ? ?Past Surgical History:  ?Procedure Laterality Date  ? BREAST LUMPECTOMY Bilateral   ? CHOLECYSTECTOMY  1999  ? SPINE SURGERY    ? ? ?There were no vitals filed for this visit. ? ? Subjective Assessment - 11/26/21 0853   ? ? Subjective Patient reports increased RLE hip pain. 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   ? Pertinent History 76 yo Female reports RLE weakness, imbalance and decreased walking ability. Her PMH is significant:  She is s/p posterior lumbar interbody fusion L3-L4 with revision of fractured rod on 01-29-21 and noted s/p T9-S2 performed on 12-23-19. She reports after 2nd surgery, the surgeon did something to affect the nerve in her right hip; She reports since the surgery she hasn't been back to the surgeon because he is [facility] and that's too far. She has been seeing Dr. Louanne Skye in [place] and she will be following up with him. She reports her pain is intermittent worse with standing/walking and is alleviated with  sitting;she does have a back brace that she will wear as needed (has worn it 1x in last several weeks). She denies any numbness/tingling down RLE. She was given gabapentin but couldn't tolerate it because of memory loss; She also tried steroids which helped a little but it made her jittery; She reports nothing helps at night. She reports frequent sleep disturbances, taking tylenol/excedrin to help manage symptoms; She typically sleeps flat on back, has increased pain in sidelying and is unable to tolerate right sidelying; She reports multiple falls with 2 significant falls in last several months. In addition, she reports significant impairement to memory which she attributes to 2nd surgery and prolonged anesthesia;   ? Limitations Standing;Walking   ? How long can you sit comfortably? as long as needed, but increased pain upon standing;   ? How long can you stand comfortably? 20 min;   ? How long can you walk comfortably? 5-58mn   ? Patient Stated Goals "Get rid of pain."   ? Currently in Pain? Yes   ? Pain Score 3    ? Pain Location Hip   ? Pain Orientation Right   ? Pain Descriptors / Indicators Aching;Sore   ? Pain Type Chronic pain   ? Pain Onset More than a  month ago   ? Aggravating Factors  caretaker duties   ? Pain Onset More than a month ago   ? ?  ?  ? ?  ? ? ? ?TREATMENT: ?Sidelying: ? ? ? Self care/ home management:  ?Patient brought in TENS unit she recently purchased.  Physical therapist walked patient through various settings and different mode she could utilize that would be beneficial for her type of pain. ? ?Patient also educated regarding utilization of gait belt for transfers and for standing and ambulation when she is caretaking.  Patient educated regarding body mechanics and biomechanics of movement in order to minimize stress on low back and lateral hips as her current patient of these activities may be affecting her pain levels.  Patient provided with demonstration as well as able to practice  utilization of gait belt.  Patient provided with gait belt from clinic in order to utilize in her caretaking responsibilities.  Patient may continue to benefit from continued education and this as well as body mechanics. ? ?Manual: ?PT performed soft tissue massage to right lateral hip and down lateral leg focusing on right gluteal tendon as well as IT band. X10 min with rolling stick.  ?Manual A with following stretches:  ?single knee to chest 30 sec hold x1 rep each LE ?            - circles clock and counterclockwise x 5 ea  ?Piriformis stretch ?-2 x 45 seconds on each side ? ?Pt educated throughout session about proper posture and technique with exercises. Improved exercise technique, movement at target joints, use of target muscles after min to mod verbal, visual, tactile cues. ?  ?  ?She reports less soreness at end of session.  ? ?Educated patient on how she can obtain a home TENs unit for proper pain management;  ? ? ? ? ? ? ? ? ? ? ? ? ? ? ? ? ? ? ? ? ? ? ? ? ? ? ? ? PT Education - 11/26/21 1031   ? ? Education Details Body mechanics demonstration and review for caretaking. Pt provided with gait belt to utilize to improve body mechanics.   ? Person(s) Educated Patient   ? Methods Explanation;Demonstration   ? Comprehension Verbalized understanding;Returned demonstration;Verbal cues required;Tactile cues required;Need further instruction   ? ?  ?  ? ?  ? ? ? PT Short Term Goals - 11/05/21 0835   ? ?  ? PT SHORT TERM GOAL #1  ? Title Patient will be adherent to HEP at least 3x a week to improve functional strength and balance for better safety at home.   ? Time 4   ? Period Weeks   ? Status Partially Met   ? Target Date 12/03/21   ?  ? PT SHORT TERM GOAL #2  ? Title Patient will report maximum of 1 sleep disturbance per night for better quality of life and improved sleep tolerance;   ? Baseline sleep disturbed less but this week has been more effected   ? Time 4   ? Period Weeks   ? Status On-going   ? Target  Date 12/03/21   ? ?  ?  ? ?  ? ? ? ? PT Long Term Goals - 11/05/21 0838   ? ?  ? PT LONG TERM GOAL #1  ? Title Patient will increase Functional Gait Assessment score to >20/30 as to reduce fall risk and improve dynamic gait safety with community ambulation.   ?  Time 8   ? Period Weeks   ? Status Achieved   ? Target Date 12/01/21   ?  ? PT LONG TERM GOAL #2  ? Title Patient will increase RLE gross strength to 4+/5 as to improve functional strength for independent gait, increased standing tolerance and increased ADL ability.   ? Baseline 4/5 with hip flexion and abduction   ? Time 8   ? Period Weeks   ? Status Partially Met   ? Target Date 12/01/21   ?  ? PT LONG TERM GOAL #3  ? Title Patient will report a worst pain of 3/10 on VAS in     right hip        to improve tolerance with ADLs and reduced symptoms with activities.   ? Baseline 5-6/10 in hip this week but had a decrease in pain for a while.   ? Time 8   ? Period Weeks   ? Status Not Met   ? Target Date 12/01/21   ?  ? PT LONG TERM GOAL #4  ? Title Patient will increase six minute walk test distance to >1000 feet without increase in right hip pain for progression to community ambulator and improve gait ability   ? Time 8   ? Period Weeks   ? Status Achieved   ? Target Date 12/01/21   ?  ? PT LONG TERM GOAL #5  ? Title Patient will improve FOTO score by 5% to indicate improved functional mobility with ADLs.   ? Baseline FOTO from 48 at IE to 53 on 11/05/21   ? Time 8   ? Period Weeks   ? Status Achieved   ? Target Date 12/01/21   ?  ? Additional Long Term Goals  ? Additional Long Term Goals Yes   ?  ? PT LONG TERM GOAL #6  ? Title Patient will improve functional gait assessment score to 26 out of 30 or better in order to improve her fall risk as well as improve her functional mobility   ? Baseline 23 of 30 on 11/05/2021   ? Time 6   ? Period Weeks   ? Status New   ? Target Date 12/17/21   ?  ? PT LONG TERM GOAL #7  ? Title Patient will report and demonstrate  ability to complete line dancing activities in order to improve her ability to participate in recreational community activities and exercise   ? Baseline Patient not competent with several [place] activities an

## 2021-11-30 ENCOUNTER — Ambulatory Visit: Payer: Medicare Other | Admitting: Physical Therapy

## 2021-11-30 DIAGNOSIS — Z1211 Encounter for screening for malignant neoplasm of colon: Secondary | ICD-10-CM | POA: Diagnosis not present

## 2021-12-02 ENCOUNTER — Ambulatory Visit: Payer: Medicare Other | Admitting: Physical Therapy

## 2021-12-02 ENCOUNTER — Telehealth: Payer: Self-pay

## 2021-12-02 NOTE — Telephone Encounter (Signed)
LM-12/02/21-Chart review completed. Reviewed medications, office visits, labs, and medications. Called pt for HTN review call. Pt. Answered but is in the hospital caring for a family member. Pt. Request I call her back on Monday 4/24. Will call pt. To complete HTN on 4/24 per pt. Request. ? ?Total time spent:12 min. ?

## 2021-12-06 NOTE — Telephone Encounter (Signed)
LM-12/06/21-Called pt. To complete HTN review call. Pt. Stated she is in the hospital caring for a family member and has not been checking her BP daily. Pt. Agreed to check daily and would like me to call back on 5/1 to complete review call.  ? ? ?Total time spent: 5 min. ?

## 2021-12-07 ENCOUNTER — Ambulatory Visit: Payer: Medicare Other | Admitting: Physical Therapy

## 2021-12-08 DIAGNOSIS — D0439 Carcinoma in situ of skin of other parts of face: Secondary | ICD-10-CM | POA: Diagnosis not present

## 2021-12-08 LAB — COLOGUARD: COLOGUARD: NEGATIVE

## 2021-12-09 ENCOUNTER — Encounter: Payer: Medicare Other | Admitting: Physical Therapy

## 2021-12-14 NOTE — Progress Notes (Signed)
Assessment and Plan: ? ? ?Rebekah Hamilton was seen today for follow-up. ? ?Diagnoses and all orders for this visit: ? ?Moderate episode of recurrent major depressive disorder ([place]) ?Continue current medication ?Practice relaxation techniques ?Continue diet and exercise ? ?Essential hypertension ?Begin HCTZ daily ?- continue DASH diet, exercise and monitor at home. Call if greater than 130/80.   ?- Go to the ER if any chest pain, shortness of breath, nausea, dizziness, severe HA, changes vision/speech  ?F/U in 4 weeks for recheck ? ?Further disposition pending results of labs. Discussed med's effects and SE's.   ?Over 30 minutes of exam, counseling, chart review, and critical decision making was performed.  ? ?Future Appointments  ?Date Time Provider [place]  ?12/16/2021  8:45 AM Hillis Range, PT [facility] None  ?12/21/2021  8:45 AM Hillis Range, PT [facility] None  ?12/23/2021  9:30 AM Hillis Range, PT [facility] None  ?12/30/2021  9:30 AM Hillis Range, PT [facility] None  ?01/04/2022  2:30 PM Hillis Range, PT [facility] None  ?01/06/2022 11:45 AM [place], Kathlee Nations, PT [facility] None  ?01/13/2022  8:45 AM Zollie Pee, PT [facility] None  ?01/20/2022 10:15 AM Hillis Range, PT [facility] None  ?01/25/2022 10:15 AM Hillis Range, PT [facility] None  ?01/27/2022 10:15 AM Hillis Range, PT [facility] None  ?02/01/2022 10:15 AM Hillis Range, PT [facility] None  ?02/03/2022 10:15 AM Hillis Range, PT [facility] None  ?02/08/2022  9:30 AM Hillis Range, PT [facility] None  ?02/10/2022  9:30 AM Hillis Range, PT [facility] None  ?02/17/2022  9:30 AM Particia Lather, PT [facility] None  ?02/22/2022  9:30 AM Particia Lather, PT [facility] None  ?02/24/2022  9:30 AM Particia Lather, PT [facility] None  ?03/01/2022  8:45 AM Hillis Range, PT [facility] None  ?03/03/2022  9:30 AM Particia Lather, PT [facility] None  ?03/08/2022  8:45 AM Hillis Range, PT  [facility] None  ?03/10/2022  8:45 AM Hillis Range, PT [facility] None  ?06/08/2022  2:00 PM Unk Pinto, MD [facility] None  ?11/16/2022  2:30 PM Denine Brotz, Townsend Roger, NP [facility] None  ? ? ?------------------------------------------------------------------------------------------------------------------ ? ? ?HPI ?BP (!) 152/68   Pulse 66   Temp (!) 97.3 ?F (36.3 ?C)   Wt 160 lb 3.2 oz (72.7 kg)   SpO2 94%   BMI 27.93 kg/m?  ? ?76 y.o.female presents for reevaluation of depression ? ?She has a diagnosis of recurrent major depression/anxiety related to her divorce which resulted in loss financially. Her mom is 100 and is at her brothers currently, she is having issues with bleeding again and is not doing well. Her ex husbands children and grandchildren wont talk to her which is causing more stress. Feels very depressed and not controlled on Duloxetine. Previously did see a therapist but do not take her insurance.  Denies suicidal/homicidal ideation. Was started on Trintellix in addition to Cymbalta. She has noted improvement in her symptoms with the addition of Trintellix. ? ? 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  Dressing is in place. Was reevaluated at dermatology today.  ? ?Blood pressure has been elevated in the past week. She denies chest pain, dizziness and shortness of breath. Had been only using HCTZ as needed.  ?BP Readings from Last 3 Encounters:  ?12/15/21 (!) 152/68  ?11/15/21 124/76  ?08/12/21 (!) 155/93  ? ?  ?She is going to PT for pain of right hip. She has persistent pain  and has a follow up appointment with the surgeon in June.  ? ? ?Past Medical History:  ?Diagnosis Date  ? Allergy   ? Anemia   ? Anxiety   ? Depression   ? Hyperlipidemia   ? Hypertension   ? Lumbar stenosis   ? Migraines   ? Prediabetes   ?  ? ?Allergies  ?Allergen Reactions  ? Augmentin [Amoxicillin-Pot Clavulanate] Diarrhea  ? Doxycycline Nausea Only  ? Egg White (Diagnostic) Nausea Only  ? Prozac  [Fluoxetine Hcl] Other (See Comments)  ?  Disoriented  ? ? ?Current Outpatient Medications on File Prior to Visit  ?Medication Sig  ? ALPRAZolam (XANAX) 0.25 MG tablet Take 1/2 to 1 tablet 2 to 3 x / day ONLY if needed for Acute Anxiety Attacks  & please try to limit to 5 days / week to avoid addiction  ? Ascorbic Acid (VITAMIN C PO) 500 mg 2 (two) times daily.  ? aspirin-acetaminophen-caffeine (EXCEDRIN EXTRA STRENGTH) 250-250-65 MG tablet Take 1 tablet by mouth daily.  ? Cholecalciferol (VITAMIN D PO) Take by mouth. Takes 10000 to 12000 units daily.  ? DULoxetine (CYMBALTA) 30 MG capsule Take  3 capsules (90 mg)  Daily  for Mood & Chronic Pain  ? estradiol (ESTRACE) 0.5 MG tablet Take  1 tablet  Daily  for Estrogen Deficiency  ? fexofenadine (ALLEGRA) 180 MG tablet Take 180 mg by mouth daily.  ? hydrochlorothiazide (HYDRODIURIL) 25 MG tablet Take 1/2 -1 tablet daily for BP and fluid for goal <130/80.  ? pravastatin (PRAVACHOL) 40 MG tablet Take  1 tablet at Bedtime for Cholesterol                                /                                        TAKE                            BY                    MOUTH  ? progesterone (PROMETRIUM) 100 MG capsule Take  1 capsule  Daily  ? rizatriptan (MAXALT) 10 MG tablet Take  1 tablet  for Migraine & may repeat 1 x in 2 hrs -[ Maximum 2 tablets  /24 hours ]  ? vortioxetine HBr (TRINTELLIX) 5 MG TABS tablet Take 1 tablet (5 mg total) by mouth daily.  ? Elastic Bandages & Supports (MEDICAL COMPRESSION STOCKINGS) MISC Knee high compression socks 20-30 pressure, measure and fit  ? ?No current facility-administered medications on file prior to visit.  ? ? ?ROS: all negative except above.  ? ?Physical Exam: ? ?BP (!) 152/68   Pulse 66   Temp (!) 97.3 ?F (36.3 ?C)   Wt 160 lb 3.2 oz (72.7 kg)   SpO2 94%   BMI 27.93 kg/m?  ? ?General Appearance: Well nourished, in no apparent distress. ?Eyes: PERRLA, EOMs, conjunctiva no swelling or erythema ?Sinuses: No Frontal/maxillary  tenderness ?ENT/Mouth: Ext aud canals clear, TMs without erythema, bulging. No erythema, swelling, or exudate on post pharynx.  Tonsils not swollen or erythematous. Hearing normal.  ?Neck: Supple, thyroid normal.  ?Respiratory: Respiratory effort normal, BS equal bilaterally without rales, rhonchi, wheezing or  stridor.  ?Cardio: RRR with no MRGs. Brisk peripheral pulses . 1+ pitting edema of feet bilaterally.  ?Abdomen: Soft, + BS.  Non tender, no guarding, rebound, hernias, masses. ?Lymphatics: Non tender without lymphadenopathy.  ?Musculoskeletal: Full ROM, 5/5 strength, normal gait.  ?Skin: Warm, dry without rashes. Intact dressing on right cheek ?Neuro: Cranial nerves intact. Normal muscle tone, no cerebellar symptoms. Sensation intact.  ?Psych: Awake and oriented X 3, normal affect, Insight and Judgment appropriate.  ?  ? ?Magda Bernheim, NP ?2:55 PM ?[facility] ? ? ?

## 2021-12-15 ENCOUNTER — Encounter: Payer: Self-pay | Admitting: Nurse Practitioner

## 2021-12-15 ENCOUNTER — Ambulatory Visit (INDEPENDENT_AMBULATORY_CARE_PROVIDER_SITE_OTHER): Payer: Medicare Other | Admitting: Nurse Practitioner

## 2021-12-15 ENCOUNTER — Telehealth: Payer: Self-pay

## 2021-12-15 VITALS — BP 152/68 | HR 66 | Temp 97.3°F | Wt 160.2 lb

## 2021-12-15 DIAGNOSIS — F331 Major depressive disorder, recurrent, moderate: Secondary | ICD-10-CM | POA: Diagnosis not present

## 2021-12-15 DIAGNOSIS — I10 Essential (primary) hypertension: Secondary | ICD-10-CM | POA: Diagnosis not present

## 2021-12-15 NOTE — Telephone Encounter (Signed)
LM-12/15/21-Calling pt. At [phone] tmplete HTN review call. Pt. Answered call but was driving. Told pt. I will call her back this afternoon, but pt. Stated she won't be available and requested I call back another day this week. Will call pt. Tomorrow to complete htn review call. ? ?Total time spent: 3 min. ?

## 2021-12-16 ENCOUNTER — Ambulatory Visit: Payer: Medicare Other | Attending: Specialist | Admitting: Physical Therapy

## 2021-12-16 ENCOUNTER — Encounter: Payer: Self-pay | Admitting: Physical Therapy

## 2021-12-16 DIAGNOSIS — M6281 Muscle weakness (generalized): Secondary | ICD-10-CM | POA: Diagnosis not present

## 2021-12-16 DIAGNOSIS — R2681 Unsteadiness on feet: Secondary | ICD-10-CM | POA: Diagnosis not present

## 2021-12-16 DIAGNOSIS — R262 Difficulty in walking, not elsewhere classified: Secondary | ICD-10-CM | POA: Diagnosis not present

## 2021-12-16 DIAGNOSIS — M25551 Pain in right hip: Secondary | ICD-10-CM | POA: Diagnosis not present

## 2021-12-16 NOTE — Therapy (Signed)
[facility] ?[place], [place], [address] ?Phone: [phone]   Fax:  [phone] ? ?Physical Therapy Treatment ? ?Patient Details  ?Name: Rebekah Hamilton ?MRN: [id number] ?Date of Birth: [birth year] ?Referring Provider (PT): Dr. Louanne Skye ? ? ?Encounter Date: 12/16/2021 ? ? PT End of Session - 12/16/21 0844   ? ? Visit Number 17   ? Number of Visits 33   ? Date for PT Re-Evaluation 02/10/22   ? Authorization Type Medicare   ? Progress Note Due on Visit 20   ? PT Start Time [phone]   ? PT Stop Time 0930   ? PT Time Calculation (min) 44 min   ? Activity Tolerance Patient tolerated treatment well   ? Behavior During Therapy [facility] for tasks assessed/performed   ? ?  ?  ? ?  ? ? ?Past Medical History:  ?Diagnosis Date  ? Allergy   ? Anemia   ? Anxiety   ? Depression   ? Hyperlipidemia   ? Hypertension   ? Lumbar stenosis   ? Migraines   ? Prediabetes   ? ? ?Past Surgical History:  ?Procedure Laterality Date  ? BREAST LUMPECTOMY Bilateral   ? CHOLECYSTECTOMY  1999  ? SPINE SURGERY    ? ? ?There were no vitals filed for this visit. ? ? Subjective Assessment - 12/16/21 0850   ? ? Subjective Patient reports having to travel a lot to see her mom in [place] who is not doing well. Patient reports increased fatigue in RLE with prolonged driving. She is also stiff and sore but states she hasn't been able to do much; Pt also had a squamous cell carcinoma removed from right cheek. She is still experiencing depression;   ? Pertinent History 76 yo Female reports RLE weakness, imbalance and decreased walking ability. Her PMH is significant:  She is s/p posterior lumbar interbody fusion L3-L4 with revision of fractured rod on 01-29-21 and noted s/p T9-S2 performed on 12-23-19. She reports after 2nd surgery, the surgeon did something to affect the nerve in her right hip; She reports since the surgery she hasn't been back to the surgeon because he is [facility] and that's too  far. She has been seeing Dr. Louanne Skye in [place] and she will be following up with him. She reports her pain is intermittent worse with standing/walking and is alleviated with sitting;she does have a back brace that she will wear as needed (has worn it 1x in last several weeks). She denies any numbness/tingling down RLE. She was given gabapentin but couldn't tolerate it because of memory loss; She also tried steroids which helped a little but it made her jittery; She reports nothing helps at night. She reports frequent sleep disturbances, taking tylenol/excedrin to help manage symptoms; She typically sleeps flat on back, has increased pain in sidelying and is unable to tolerate right sidelying; She reports multiple falls with 2 significant falls in last several months. In addition, she reports significant impairement to memory which she attributes to 2nd surgery and prolonged anesthesia;   ? Limitations Standing;Walking   ? How long can you sit comfortably? as long as needed, but increased pain upon standing;   ? How long can you stand comfortably? 20 min;   ? How long can you walk comfortably? 5-17mn   ? Patient Stated Goals "Get rid of pain."   ? Currently in Pain? Yes   ? Pain Score 4    ?  Pain Location Hip   ? Pain Orientation Right   ? Pain Descriptors / Indicators Aching;Sore   ? Pain Type Chronic pain   ? Pain Radiating Towards posterior hip and radiates to side and aches down side of leg;   ? Pain Onset More than a month ago   ? Pain Frequency Intermittent   ? Aggravating Factors  caretaker duties/driving   ? Pain Relieving Factors stretches/heat   ? Effect of Pain on Daily Activities decreased activity tolerance;   ? Multiple Pain Sites No   ? Pain Onset More than a month ago   ? ?  ?  ? ?  ? ? ? ? ? [facility] PT Assessment - 12/16/21 0001   ? ?  ? Observation/Other Assessments  ? Focus on Therapeutic Outcomes (FOTO)  61%   ?  ? Strength  ? Right Hip Flexion 4-/5   ? Right Hip ABduction 3/5   ? Left Hip Flexion  4+/5   ? Left Hip ABduction 3+/5   ?  ? 6 minute walk test results   ? Aerobic Endurance Distance Walked 1250   ? Endurance additional comments without AD, community ambulator   ? ?  ?  ? ?  ?TREATMENT: ?Instructed patient in LE stretches to help alleviate stiffness: ?Hooklying: ?Legs apart trunk rotation, windshield wiper x10 reps each direction; ?PT performed passive SLR hip flexion hamstring stretch 20 sec hold ? Progressed with ankle DF/PF x10 reps neural stretch; ?PT performed passive knee to chest stretch 20 sec hold x2 reps ? Progressed to hip circles x5 reps each direction ?Crossed LE over other leg for IT band stretch 5 sec hold x3 reps each LE ? ?Instructed patient in LE strengthening: ?Hooklying: ?SLR hip flexion x10 reps each LE ? ? ?Instructed patient in outcome measures to address progress towards goals ? ?Reinforced HEP: ?Added SLS  ?Reinforced IT band stretch; ?See patient instructions ? ?Patient will be on hold for 1 month and then will resume PT in June 2023 ? ? ? ? ? ? ? ? ? ? ? ? ? ? ? ? ? ? ? ? ? ? PT Education - 12/16/21 0844   ? ? Education Details exercise technique/positioning; progress towards goals;   ? Person(s) Educated Patient   ? Methods Explanation;Verbal cues   ? Comprehension Verbalized understanding;Returned demonstration;Verbal cues required;Need further instruction   ? ?  ?  ? ?  ? ? ? PT Short Term Goals - 12/16/21 0856   ? ?  ? PT SHORT TERM GOAL #1  ? Title Patient will be adherent to HEP at least 3x a week to improve functional strength and balance for better safety at home.   ? Baseline 5/4: hasn't been able to do most of exercises due to family issues and health issues;   ? Time 4   ? Period Weeks   ? Status Partially Met   ? Target Date 12/03/21   ?  ? PT SHORT TERM GOAL #2  ? Title Patient will report maximum of 1 sleep disturbance per night for better quality of life and improved sleep tolerance;   ? Baseline sleep disturbed less but this week has been more effected;  5/4: still waking up- using a pillow which helps, 1x a night   ? Time 4   ? Period Weeks   ? Status On-going   ? Target Date 12/03/21   ? ?  ?  ? ?  ? ? ? ? PT  Long Term Goals - 12/16/21 0857   ? ?  ? PT LONG TERM GOAL #1  ? Title Patient will increase Functional Gait Assessment score to >20/30 as to reduce fall risk and improve dynamic gait safety with community ambulation.   ? Baseline 5/4: deferred   ? Time 8   ? Period Weeks   ? Status Achieved   ? Target Date 02/10/22   ?  ? PT LONG TERM GOAL #2  ? Title Patient will increase RLE gross strength to 4+/5 as to improve functional strength for independent gait, increased standing tolerance and increased ADL ability.   ? Baseline 4/5 with hip flexion and abduction   ? Time 8   ? Period Weeks   ? Status Partially Met   ? Target Date 02/10/22   ?  ? PT LONG TERM GOAL #3  ? Title Patient will report a worst pain of 3/10 on VAS in     right hip        to improve tolerance with ADLs and reduced symptoms with activities.   ? Baseline 5-6/10 in hip this week but had a decrease in pain for a while. 5/4: 7/10   ? Time 8   ? Period Weeks   ? Status Not Met   ? Target Date 02/10/22   ?  ? PT LONG TERM GOAL #4  ? Title Patient will increase six minute walk test distance to >1000 feet without increase in right hip pain for progression to community ambulator and improve gait ability   ? Baseline 5/4: 1250 feet;   ? Time 8   ? Period Weeks   ? Status Achieved   ? Target Date 02/10/22   ?  ? PT LONG TERM GOAL #5  ? Title Patient will improve FOTO score by 5% to indicate improved functional mobility with ADLs.   ? Baseline FOTO from 48 at IE to 53 on 11/05/21   ? Time 8   ? Period Weeks   ? Status Achieved   ? Target Date 02/10/22   ?  ? PT LONG TERM GOAL #6  ? Title Patient will improve functional gait assessment score to 26 out of 30 or better in order to improve her fall risk as well as improve her functional mobility   ? Baseline 23 of 30 on 11/05/2021, 5/4: deferred;   ? Time 8    ? Period Weeks   ? Status On-going   ? Target Date 02/10/22   ?  ? PT LONG TERM GOAL #7  ? Title Patient will report and demonstrate ability to complete line dancing activities in order to improve her a

## 2021-12-16 NOTE — Telephone Encounter (Signed)
LM-12/16/21-Calling pt. At [phone] to complete HTN review call. Unable to reach pt. [place]. ? ?Total time spent: 2 min. ?

## 2021-12-16 NOTE — Patient Instructions (Signed)
Access Code: [email or web address] ?URL: [email or web address] ?Date: 12/16/2021 ?Prepared by: Blanche East ? ?Exercises ?- Standing Single Leg Stance with Counter Support  - 1 x daily - 7 x weekly - 1 sets - 2-3 reps - 60 sec hold ?- Supine ITB Stretch  - 1 x daily - 7 x weekly - 1 sets - 2-3 reps - 15 sec hold ?- Seated Figure 4 Piriformis Stretch  - 1 x daily - 7 x weekly - 1 sets - 2-3 reps - 15-30 sec hold ?

## 2021-12-20 NOTE — Telephone Encounter (Signed)
LM-12/20/21-Pt. Returned call and completed HTN review call and CCM to CSS transfer. ? ?Total time spent: 16 min. ? ?Hypertension Review (HC) ?Is the patient enrolled in RPM with BP Monitor?: ?No ?BP #1 reading (last): ?152/68 ?on: ?12/15/2021 ?BP #2 reading: ?124/76 ?on: ?11/15/2021 ?BP #3 reading: ?155/93 ?on: ?08/12/2021 ?Any of the last 3 BP > 140/90 mmHg?: ?Yes ?What recent interventions have been made by any provider to improve the patient's conditions in the last 3 months?: ?09/24/21-Sandridge, Hassan Rowan K-No information available. ?09/24/21-Zedek, Daniel-No information available. ?11/15/21-Dana Demetra Shiner, NP- Pt. presented for Medicare AWV. ?STARTED ? Vortioxetine HBr 5 mg Oral Daily ?STOPPED ? Diclofenac Sodium 2 g Topical 4 times daily  ? Gabapentin 100 mg Oral Daily at bedtime, Take  1 capsule  4 x /day  for Pain  ? Metoprolol Tartrate 12.5 mg Oral 2 times daily ?12/15/21-Dana Mull, NP- Pt. presented for f/u. Restarted HCTZ. No other medication changes noted. ?Any recent hospitalizations or ED visits since last visit with CPP?: ?No ? ?Adherence Review ?Adherence rates for STAR metric medications: ? Hydrochlorothiazide '25mg'$  / 90DS / 05/25/20 & 11/15/21 ? Pravastatin '40mg'$  / 90DS / 08/02/21 & 11/08/21 ?Adherence rates for medications indicated for disease state being reviewed: ? Hydrochlorothiazide '25mg'$  / 90DS / 05/25/20 & 11/15/21 ? Pravastatin '40mg'$  / 90DS / 08/02/21 & 11/08/21 ?Does the patient have >5 day gap between last estimated fill dates for any of the above medications?: ?No ? ?Disease State Questions ?Able to connect with the Patient?: ?Yes ?Is the patient monitoring his/her BP?: ?Yes ?How often are you checking your BP?: ?daily ?Home BP Reading #1 (most recent): ?130/83  ?Home BP Reading #2: ?134/83 ?Home BP Reading #3: ?151/84 ?Is the patient having any low BP Readings <90/60?: ?No ?Is the patient having any BP readings above >180/100?: ?No ?Is the patient's average BP>140/90?: ?No ?What is your blood pressure  goal?: ?<120/60 ?Educate patient to inform proper points on checking BP at home: (Multiselect): ?When taking resting blood pressure: sit quietly for 5 minutes, not within 30 min. of exercising, no talking., Sit with feet flat on the floor, arm at heart level., Do not drink caffeine or smoke a cigarette at least 30 min. prior to checking., Make sure using the right size cuff, the length of the cuff?s bladder should be at least equal to 75% of the circumference of the upper arm. ?What diet changes have you made to improve your Blood Pressure Control? (Multiselect): ?limiting / monitoring salt intake, eating more fruits and vegetables ?What exercise are you doing to improve your Blood Pressure Control? (Multiselect): ?other ?Details: ?PT Twice a week.  ?Misc. Response/Information:: ?Pt. confirmed she has restarted her HCTZ. ? ? ?

## 2021-12-21 ENCOUNTER — Ambulatory Visit: Payer: Medicare Other | Admitting: Physical Therapy

## 2021-12-23 ENCOUNTER — Ambulatory Visit: Payer: Medicare Other | Admitting: Physical Therapy

## 2021-12-30 ENCOUNTER — Ambulatory Visit: Payer: Medicare Other | Admitting: Physical Therapy

## 2022-01-04 ENCOUNTER — Ambulatory Visit: Payer: Medicare Other | Admitting: Physical Therapy

## 2022-01-06 ENCOUNTER — Ambulatory Visit: Payer: Medicare Other

## 2022-01-06 ENCOUNTER — Other Ambulatory Visit: Payer: Self-pay | Admitting: Nurse Practitioner

## 2022-01-06 MED ORDER — ESTRADIOL 0.5 MG PO TABS: ORAL_TABLET | ORAL | 3 refills | Status: DC

## 2022-01-12 ENCOUNTER — Ambulatory Visit: Payer: Medicare Other | Admitting: Nurse Practitioner

## 2022-01-13 ENCOUNTER — Ambulatory Visit: Payer: Medicare Other

## 2022-01-18 DIAGNOSIS — L905 Scar conditions and fibrosis of skin: Secondary | ICD-10-CM | POA: Diagnosis not present

## 2022-01-20 ENCOUNTER — Ambulatory Visit: Payer: Medicare Other | Attending: Specialist | Admitting: Physical Therapy

## 2022-01-20 ENCOUNTER — Encounter: Payer: Self-pay | Admitting: Physical Therapy

## 2022-01-20 DIAGNOSIS — R2681 Unsteadiness on feet: Secondary | ICD-10-CM | POA: Diagnosis not present

## 2022-01-20 DIAGNOSIS — M6281 Muscle weakness (generalized): Secondary | ICD-10-CM | POA: Insufficient documentation

## 2022-01-20 DIAGNOSIS — R262 Difficulty in walking, not elsewhere classified: Secondary | ICD-10-CM | POA: Insufficient documentation

## 2022-01-20 DIAGNOSIS — M25551 Pain in right hip: Secondary | ICD-10-CM | POA: Insufficient documentation

## 2022-01-20 NOTE — Therapy (Signed)
[place] [address] [place], [place], [address] Phone: [phone]   Fax:  [phone]  Physical Therapy Treatment  Patient Details  Name: Rebekah Hamilton MRN: [id number] Date of Birth: 04/29/1946 Referring Provider (PT): Dr. Louanne Skye   Encounter Date: 01/20/2022   PT End of Session - 01/20/22 1017     Visit Number 18    Number of Visits 33    Date for PT Re-Evaluation 02/10/22    Authorization Type Medicare    Progress Note Due on Visit 20    PT Start Time 1016    PT Stop Time 1100    PT Time Calculation (min) 44 min    Activity Tolerance Patient tolerated treatment well    Behavior During Therapy [facility] for tasks assessed/performed             Past Medical History:  Diagnosis Date   Allergy    Anemia    Anxiety    Depression    Hyperlipidemia    Hypertension    Lumbar stenosis    Migraines    Prediabetes     Past Surgical History:  Procedure Laterality Date   BREAST LUMPECTOMY Bilateral    CHOLECYSTECTOMY  1999   SPINE SURGERY      There were no vitals filed for this visit.   Subjective Assessment - 01/20/22 1020     Subjective Patient reports her right hip and back is still hurting. She also reports her mom is still in the hospital and not doing well. "I am not sure how much longer I will be able to keep coming." She reports she is going to see her neurosurgeon next week; She reports still having difficulty with driving. She reports doing some exercise but admits she isn't doing them like she should.    Pertinent History 76 yo Female reports RLE weakness, imbalance and decreased walking ability. Her PMH is significant:  She is s/p posterior lumbar interbody fusion L3-L4 with revision of fractured rod on 01-29-21 and noted s/p T9-S2 performed on 12-23-19. She reports after 2nd surgery, the surgeon did something to affect the nerve in her right hip; She reports since the surgery she hasn't been back  to the surgeon because he is [facility] and that's too far. She has been seeing Dr. Louanne Skye in [place] and she will be following up with him. She reports her pain is intermittent worse with standing/walking and is alleviated with sitting;she does have a back brace that she will wear as needed (has worn it 1x in last several weeks). She denies any numbness/tingling down RLE. She was given gabapentin but couldn't tolerate it because of memory loss; She also tried steroids which helped a little but it made her jittery; She reports nothing helps at night. She reports frequent sleep disturbances, taking tylenol/excedrin to help manage symptoms; She typically sleeps flat on back, has increased pain in sidelying and is unable to tolerate right sidelying; She reports multiple falls with 2 significant falls in last several months. In addition, she reports significant impairement to memory which she attributes to 2nd surgery and prolonged anesthesia;    Limitations Standing;Walking    How long can you sit comfortably? as long as needed, but increased pain upon standing;    How long can you stand comfortably? 20 min;    How long can you walk comfortably? 5-14mn    Patient Stated Goals "Get rid of pain."    Currently in  Pain? Yes    Pain Score 3     Pain Location Hip    Pain Orientation Right    Pain Descriptors / Indicators Aching;Sore    Pain Type Chronic pain    Pain Onset More than a month ago    Pain Frequency Intermittent    Aggravating Factors  driving/prolonged activity    Pain Relieving Factors stretches/heat    Effect of Pain on Daily Activities decreased activity tolerance;    Multiple Pain Sites No    Pain Onset More than a month ago                  TREATMENT: Instructed patient in LE stretches to help alleviate stiffness: Hooklying: Legs apart trunk rotation, windshield wiper x10 reps each direction; PT performed passive SLR hip flexion hamstring stretch 20 sec hold              Progressed with ankle DF/PF x10 reps neural stretch; PT performed passive knee to chest stretch 20 sec hold x2 reps             Progressed to hip circles x5 reps each direction Crossed LE over other leg for IT band stretch 5 sec hold x3 reps each LE Patient reports increased soreness in RLE hip with passive circles PT performed long axis distraction RLE 20 sec hold, 10 sec rest x5 min with good tolerance Following long axis distraction, PT performed passive RLE hip circles, with no pain reported;    Instructed patient in LE strengthening: Hooklying: SLR hip flexion 2# x10 reps each LE Suspended heel slides 2# x10 reps each LE Patient required min-moderate verbal/tactile cues for correct exercise technique including to      Sidelying: Hip abduction SLR 2#, 2x10 reps each LE; Does require min VCS for proper positioning/exercise technique for optimal strengthening;   PT uiltized rolling stick to outer hip x5 min to alleviate tightness/soreness;  Patient does continue to have increased weakness in BLE which contributes to some soreness.   She reports less pain at end of session;                           PT Education - 01/20/22 1017     Education Details exercise technique/positioning;    Person(s) Educated Patient    Methods Explanation;Verbal cues    Comprehension Verbalized understanding;Returned demonstration;Verbal cues required;Need further instruction              PT Short Term Goals - 12/16/21 0856       PT SHORT TERM GOAL #1   Title Patient will be adherent to HEP at least 3x a week to improve functional strength and balance for better safety at home.    Baseline 5/4: hasn't been able to do most of exercises due to family issues and health issues;    Time 4    Period Weeks    Status Partially Met    Target Date 12/03/21      PT SHORT TERM GOAL #2   Title Patient will report maximum of 1 sleep disturbance per night for better quality of  life and improved sleep tolerance;    Baseline sleep disturbed less but this week has been more effected; 5/4: still waking up- using a pillow which helps, 1x a night    Time 4    Period Weeks    Status On-going    Target Date 12/03/21  PT Long Term Goals - 12/16/21 0857       PT LONG TERM GOAL #1   Title Patient will increase Functional Gait Assessment score to >20/30 as to reduce fall risk and improve dynamic gait safety with community ambulation.    Baseline 5/4: deferred    Time 8    Period Weeks    Status Achieved    Target Date 02/10/22      PT LONG TERM GOAL #2   Title Patient will increase RLE gross strength to 4+/5 as to improve functional strength for independent gait, increased standing tolerance and increased ADL ability.    Baseline 4/5 with hip flexion and abduction    Time 8    Period Weeks    Status Partially Met    Target Date 02/10/22      PT LONG TERM GOAL #3   Title Patient will report a worst pain of 3/10 on VAS in     right hip        to improve tolerance with ADLs and reduced symptoms with activities.    Baseline 5-6/10 in hip this week but had a decrease in pain for a while. 5/4: 7/10    Time 8    Period Weeks    Status Not Met    Target Date 02/10/22      PT LONG TERM GOAL #4   Title Patient will increase six minute walk test distance to >1000 feet without increase in right hip pain for progression to community ambulator and improve gait ability    Baseline 5/4: 1250 feet;    Time 8    Period Weeks    Status Achieved    Target Date 02/10/22      PT LONG TERM GOAL #5   Title Patient will improve FOTO score by 5% to indicate improved functional mobility with ADLs.    Baseline FOTO from 48 at IE to 53 on 11/05/21    Time 8    Period Weeks    Status Achieved    Target Date 02/10/22      PT LONG TERM GOAL #6   Title Patient will improve functional gait assessment score to 26 out of 30 or better in order to improve her fall risk  as well as improve her functional mobility    Baseline 23 of 30 on 11/05/2021, 5/4: deferred;    Time 8    Period Weeks    Status On-going    Target Date 02/10/22      PT LONG TERM GOAL #7   Title Patient will report and demonstrate ability to complete line dancing activities in order to improve her ability to participate in recreational community activities and exercise    Baseline Patient not competent with several Aline August activities and feels her right lower extremity gives way at times, 5/4: too wobbly    Time 8    Period Weeks    Status On-going    Target Date 02/10/22                   Plan - 01/20/22 1026     Clinical Impression Statement Patient motivated and participated well within session. She is still experiencing pain in low back and right hip intermittently. She has been overwhelmed trying to help care for her mom and traveling to [place]. Instructed patient in gentle ROM to help alleviate stiffness. She was instructed in LE strengthening to tolerance. Patient does require min VCS for proper exercise  technique. She would benefit from additional skilled PT intervention to improve strength, balance and mobility;    Personal Factors and Comorbidities Comorbidity 3+;Past/Current Experience;Time since onset of injury/illness/exacerbation    Comorbidities HTN(variable), pre-diabetic, past spinal fusion and revision, anxiety/depression (somewhat controlled), recent falls    Examination-Activity Limitations Bed Mobility;Locomotion Level;Sleep;Squat;Stairs;Stand;Transfers    Examination-Participation Restrictions Cleaning;Community Activity;Shop;Volunteer;Yard Work    [employer] Evolving/Moderate complexity    Rehab Potential Fair    PT Frequency 2x / week    PT Duration 8 weeks    PT Treatment/Interventions ADLs/Self Care Home Management;Cryotherapy;Electrical Stimulation;Moist Heat;Gait training;Stair training;Functional mobility training;Therapeutic  activities;Therapeutic exercise;Balance training;Neuromuscular re-education;Patient/family education;Manual techniques;Dry needling;Energy conservation;Passive range of motion;Joint Manipulations    PT Next Visit Plan balance progression    PT Home Exercise Plan updated 11/09/21    Consulted and Agree with Plan of Care Patient             Patient will benefit from skilled therapeutic intervention in order to improve the following deficits and impairments:  Decreased range of motion, Difficulty walking, Decreased activity tolerance, Pain, Hypermobility, Decreased balance, Decreased strength, Decreased mobility  Visit Diagnosis: Difficulty in walking, not elsewhere classified  Unsteadiness on feet  Pain in right hip  Muscle weakness (generalized)     Problem List Patient Active Problem List   Diagnosis Date Noted   S/P lumbar fusion 11/12/2020   Chronic venous insufficiency 05/07/2019   History of 2019 novel coronavirus disease (COVID-19) 05/07/2019   Chronic left-sided low back pain with left-sided sciatica 07/02/2018   OAB (overactive bladder) 04/02/2018   CKD (chronic kidney disease), symptom management only, stage 2 (mild) 09/24/2017   Encounter for Medicare annual wellness exam 06/02/2015   Glaucoma 06/02/2015   Vitamin D deficiency 10/21/2014   Medication management 12/01/2013   Hyperlipidemia, mixed    Essential hypertension    Abnormal glucose    Migraines    Allergy    Anxiety    Depression, major, recurrent ([place])    Lumbar stenosis     Alma Mohiuddin, PT, DPT 01/20/2022, 11:01 AM  [place], [place], [address] Phone: [phone]   Fax:  [phone]  Name: Keimya Briddell MRN: [id number] Date of Birth: 17-Mar-1946

## 2022-01-25 ENCOUNTER — Encounter: Payer: Self-pay | Admitting: Physical Therapy

## 2022-01-25 ENCOUNTER — Ambulatory Visit: Payer: Medicare Other | Admitting: Physical Therapy

## 2022-01-25 DIAGNOSIS — R262 Difficulty in walking, not elsewhere classified: Secondary | ICD-10-CM

## 2022-01-25 DIAGNOSIS — R2681 Unsteadiness on feet: Secondary | ICD-10-CM

## 2022-01-25 DIAGNOSIS — M6281 Muscle weakness (generalized): Secondary | ICD-10-CM | POA: Diagnosis not present

## 2022-01-25 DIAGNOSIS — M25551 Pain in right hip: Secondary | ICD-10-CM

## 2022-01-25 NOTE — Therapy (Signed)
[place] MAIN [facility] SERVICES [address] [place], [place], [address] Phone: [phone]   Fax:  [phone]  Physical Therapy Treatment  Patient Details  Name: Rebekah Hamilton MRN: [id number] Date of Birth: 12-30-1945 Referring Provider (PT): Dr. Louanne Skye   Encounter Date: 01/25/2022   PT End of Session - 01/25/22 1033     Visit Number 19    Number of Visits 33    Date for PT Re-Evaluation 02/10/22    Authorization Type Medicare    Progress Note Due on Visit 20    PT Start Time 1020    PT Stop Time 1100    PT Time Calculation (min) 40 min    Activity Tolerance Patient tolerated treatment well    Behavior During Therapy [facility] for tasks assessed/performed             Past Medical History:  Diagnosis Date   Allergy    Anemia    Anxiety    Depression    Hyperlipidemia    Hypertension    Lumbar stenosis    Migraines    Prediabetes     Past Surgical History:  Procedure Laterality Date   BREAST LUMPECTOMY Bilateral    CHOLECYSTECTOMY  1999   SPINE SURGERY      There were no vitals filed for this visit.   Subjective Assessment - 01/25/22 1025     Subjective Patient reports doing okay. She reports continued intermittent soreness in hip. She reports no pain currently.She reports about 2-3 sleep disturbances in the last week related to pain; She reports her right hip pain (groin) has resolved);    Pertinent History 76 yo Female reports RLE weakness, imbalance and decreased walking ability. Her PMH is significant:  She is s/p posterior lumbar interbody fusion L3-L4 with revision of fractured rod on 01-29-21 and noted s/p T9-S2 performed on 12-23-19. She reports after 2nd surgery, the surgeon did something to affect the nerve in her right hip; She reports since the surgery she hasn't been back to the surgeon because he is [facility] and that's too far. She has been seeing Dr. Louanne Skye in [place] and she will be following up with  him. She reports her pain is intermittent worse with standing/walking and is alleviated with sitting;she does have a back brace that she will wear as needed (has worn it 1x in last several weeks). She denies any numbness/tingling down RLE. She was given gabapentin but couldn't tolerate it because of memory loss; She also tried steroids which helped a little but it made her jittery; She reports nothing helps at night. She reports frequent sleep disturbances, taking tylenol/excedrin to help manage symptoms; She typically sleeps flat on back, has increased pain in sidelying and is unable to tolerate right sidelying; She reports multiple falls with 2 significant falls in last several months. In addition, she reports significant impairement to memory which she attributes to 2nd surgery and prolonged anesthesia;    Limitations Standing;Walking    How long can you sit comfortably? as long as needed, but increased pain upon standing;    How long can you stand comfortably? 20 min;    How long can you walk comfortably? 5-13mn    Patient Stated Goals "Get rid of pain."    Currently in Pain? No/denies    Pain Onset More than a month ago    Pain Onset More than a month ago  TREATMENT: Instructed patient in LE stretches to help alleviate stiffness: Hooklying: Legs apart trunk rotation, windshield wiper x10 reps each direction; Cross leg hip IT band stretch 20 sec hold x2 reps;    Instructed patient in LE strengthening: Hooklying: SLR hip flexion 2# x10 reps each LE Suspended heel slides 2# x10 reps each LE Patient required min-moderate verbal/tactile cues for correct exercise technique including to increase core stabilization during LE exercise;  Green tband around BLE: -hip abduction/ER x15 reps Bridges with arms by side with hips abducted to challenge gluteal activation x15 reps;       Sidelying: Hip abduction SLR 2#, x10 reps each LE; Hip abduction SLR circles  clockwise/counterclockwise 2#, 2x5 reps each direction each LE Does require min VCS for proper positioning/exercise technique for optimal strengthening; She reports slight increase difficulty when moving LLE compared to RLE;    PT uiltized rolling stick to outer hip x5 min to alleviate tightness/soreness, each LE  Hooklying: Piriformis stretch, modified 20 sec hold x2 reps each LE;    Patient does continue to have increased weakness in BLE which contributes to some soreness.    She tolerated session well without significant increase in pain at end of session.       Rationale for Evaluation and Treatment Rehabilitation                      PT Education - 01/25/22 1033     Education Details exercise technique/positioning;    Person(s) Educated Patient    Methods Explanation;Verbal cues    Comprehension Verbalized understanding;Returned demonstration;Verbal cues required;Need further instruction              PT Short Term Goals - 12/16/21 0856       PT SHORT TERM GOAL #1   Title Patient will be adherent to HEP at least 3x a week to improve functional strength and balance for better safety at home.    Baseline 5/4: hasn't been able to do most of exercises due to family issues and health issues;    Time 4    Period Weeks    Status Partially Met    Target Date 12/03/21      PT SHORT TERM GOAL #2   Title Patient will report maximum of 1 sleep disturbance per night for better quality of life and improved sleep tolerance;    Baseline sleep disturbed less but this week has been more effected; 5/4: still waking up- using a pillow which helps, 1x a night    Time 4    Period Weeks    Status On-going    Target Date 12/03/21               PT Long Term Goals - 12/16/21 0857       PT LONG TERM GOAL #1   Title Patient will increase Functional Gait Assessment score to >20/30 as to reduce fall risk and improve dynamic gait safety with community ambulation.     Baseline 5/4: deferred    Time 8    Period Weeks    Status Achieved    Target Date 02/10/22      PT LONG TERM GOAL #2   Title Patient will increase RLE gross strength to 4+/5 as to improve functional strength for independent gait, increased standing tolerance and increased ADL ability.    Baseline 4/5 with hip flexion and abduction    Time 8    Period Weeks    Status  Partially Met    Target Date 02/10/22      PT LONG TERM GOAL #3   Title Patient will report a worst pain of 3/10 on VAS in     right hip        to improve tolerance with ADLs and reduced symptoms with activities.    Baseline 5-6/10 in hip this week but had a decrease in pain for a while. 5/4: 7/10    Time 8    Period Weeks    Status Not Met    Target Date 02/10/22      PT LONG TERM GOAL #4   Title Patient will increase six minute walk test distance to >1000 feet without increase in right hip pain for progression to community ambulator and improve gait ability    Baseline 5/4: 1250 feet;    Time 8    Period Weeks    Status Achieved    Target Date 02/10/22      PT LONG TERM GOAL #5   Title Patient will improve FOTO score by 5% to indicate improved functional mobility with ADLs.    Baseline FOTO from 48 at IE to 53 on 11/05/21    Time 8    Period Weeks    Status Achieved    Target Date 02/10/22      PT LONG TERM GOAL #6   Title Patient will improve functional gait assessment score to 26 out of 30 or better in order to improve her fall risk as well as improve her functional mobility    Baseline 23 of 30 on 11/05/2021, 5/4: deferred;    Time 8    Period Weeks    Status On-going    Target Date 02/10/22      PT LONG TERM GOAL #7   Title Patient will report and demonstrate ability to complete line dancing activities in order to improve her ability to participate in recreational community activities and exercise    Baseline Patient not competent with several Aline August activities and feels her right lower extremity  gives way at times, 5/4: too wobbly    Time 8    Period Weeks    Status On-going    Target Date 02/10/22                   Plan - 01/25/22 1043     Clinical Impression Statement Patient motivated and participated well within session. She is still experiencing pain in low back and right hip intermittently. Instructed patient in gentle ROM to help alleviate stiffness. She was instructed in LE strengthening to tolerance. Progressed LE strengthening with increased repetition/resistance;  Patient does require min VCS for proper exercise technique. She would benefit from additional skilled PT intervention to improve strength, balance and mobility;    Personal Factors and Comorbidities Comorbidity 3+;Past/Current Experience;Time since onset of injury/illness/exacerbation    Comorbidities HTN(variable), pre-diabetic, past spinal fusion and revision, anxiety/depression (somewhat controlled), recent falls    Examination-Activity Limitations Bed Mobility;Locomotion Level;Sleep;Squat;Stairs;Stand;Transfers    Examination-Participation Restrictions Cleaning;Community Activity;Shop;Volunteer;Yard Work    [employer] officer Evolving/Moderate complexity    Rehab Potential Fair    PT Frequency 2x / week    PT Duration 8 weeks    PT Treatment/Interventions ADLs/Self Care Home Management;Cryotherapy;Electrical Stimulation;Moist Heat;Gait training;Stair training;Functional mobility training;Therapeutic activities;Therapeutic exercise;Balance training;Neuromuscular re-education;Patient/family education;Manual techniques;Dry needling;Energy conservation;Passive range of motion;Joint Manipulations    PT Next Visit Plan balance progression    PT Home Exercise Plan updated 11/09/21  Consulted and Agree with Plan of Care Patient             Patient will benefit from skilled therapeutic intervention in order to improve the following deficits and impairments:  Decreased range of motion,  Difficulty walking, Decreased activity tolerance, Pain, Hypermobility, Decreased balance, Decreased strength, Decreased mobility  Visit Diagnosis: Difficulty in walking, not elsewhere classified  Unsteadiness on feet  Pain in right hip  Muscle weakness (generalized)     Problem List Patient Active Problem List   Diagnosis Date Noted   S/P lumbar fusion 11/12/2020   Chronic venous insufficiency 05/07/2019   History of 2019 novel coronavirus disease (COVID-19) 05/07/2019   Chronic left-sided low back pain with left-sided sciatica 07/02/2018   OAB (overactive bladder) 04/02/2018   CKD (chronic kidney disease), symptom management only, stage 2 (mild) 09/24/2017   Encounter for Medicare annual wellness exam 06/02/2015   Glaucoma 06/02/2015   Vitamin D deficiency 10/21/2014   Medication management 12/01/2013   Hyperlipidemia, mixed    Essential hypertension    Abnormal glucose    Migraines    Allergy    Anxiety    Depression, major, recurrent ([place])    Lumbar stenosis     Raliyah Montella, PT, DPT 01/25/2022, 11:05 AM  [facility] [address] [place], [place], [address] Phone: [phone]   Fax:  [phone]  Name: Rebekah Hamilton MRN: [id number] Date of Birth: [birth year]

## 2022-01-26 DIAGNOSIS — M79651 Pain in right thigh: Secondary | ICD-10-CM | POA: Diagnosis not present

## 2022-01-26 DIAGNOSIS — M4316 Spondylolisthesis, lumbar region: Secondary | ICD-10-CM | POA: Diagnosis not present

## 2022-01-26 DIAGNOSIS — M47892 Other spondylosis, cervical region: Secondary | ICD-10-CM | POA: Diagnosis not present

## 2022-01-26 DIAGNOSIS — M25551 Pain in right hip: Secondary | ICD-10-CM | POA: Diagnosis not present

## 2022-01-26 DIAGNOSIS — Z981 Arthrodesis status: Secondary | ICD-10-CM | POA: Diagnosis not present

## 2022-01-26 DIAGNOSIS — M438X5 Other specified deforming dorsopathies, thoracolumbar region: Secondary | ICD-10-CM | POA: Diagnosis not present

## 2022-01-26 DIAGNOSIS — M5114 Intervertebral disc disorders with radiculopathy, thoracic region: Secondary | ICD-10-CM | POA: Diagnosis not present

## 2022-01-27 ENCOUNTER — Encounter: Payer: Self-pay | Admitting: Physical Therapy

## 2022-01-27 ENCOUNTER — Ambulatory Visit: Payer: Medicare Other | Admitting: Physical Therapy

## 2022-01-27 DIAGNOSIS — M25551 Pain in right hip: Secondary | ICD-10-CM | POA: Diagnosis not present

## 2022-01-27 DIAGNOSIS — R262 Difficulty in walking, not elsewhere classified: Secondary | ICD-10-CM | POA: Diagnosis not present

## 2022-01-27 DIAGNOSIS — M6281 Muscle weakness (generalized): Secondary | ICD-10-CM | POA: Diagnosis not present

## 2022-01-27 DIAGNOSIS — R2681 Unsteadiness on feet: Secondary | ICD-10-CM | POA: Diagnosis not present

## 2022-01-27 NOTE — Therapy (Addendum)
[place] MAIN [facility] SERVICES [address] [place], [place], [address] Phone: [phone]   Fax:  [phone]  Physical Therapy Treatment/ Physical Therapy Progress Note   Dates of reporting period  11/05/21   to   01/27/22   Patient Details  Name: Rebekah Hamilton MRN: [id number] Date of Birth: [birth year] Referring Provider (PT): Dr. Louanne Skye   Encounter Date: 01/27/2022   PT End of Session - 01/27/22 1314     Visit Number 20    Number of Visits 33    Date for PT Re-Evaluation 02/10/22    Authorization Type Medicare    Progress Note Due on Visit 20    PT Start Time 1022    PT Stop Time 1101    PT Time Calculation (min) 39 min    Activity Tolerance Patient tolerated treatment well    Behavior During Therapy [facility] for tasks assessed/performed             Past Medical History:  Diagnosis Date   Allergy    Anemia    Anxiety    Depression    Hyperlipidemia    Hypertension    Lumbar stenosis    Migraines    Prediabetes     Past Surgical History:  Procedure Laterality Date   BREAST LUMPECTOMY Bilateral    CHOLECYSTECTOMY  1999   SPINE SURGERY      There were no vitals filed for this visit.   Subjective Assessment - 01/27/22 1023     Subjective Pt reports she has been missing a lot of therapy due to her mother being sick in [place]. Pt reports he back is doing well but her nerve related pain  could conitnue to be a problem per her surgeon/ MD. patient reports would like to incorporate more balance and endurance into her physical therapy activities.    Pertinent History 76 yo Female reports RLE weakness, imbalance and decreased walking ability. Her PMH is significant:  She is s/p posterior lumbar interbody fusion L3-L4 with revision of fractured rod on 01-29-21 and noted s/p T9-S2 performed on 12-23-19. She reports after 2nd surgery, the surgeon did something to affect the nerve in her right hip; She reports since the surgery she  hasn't been back to the surgeon because he is [facility] and that's too far. She has been seeing Dr. Louanne Skye in [place] and she will be following up with him. She reports her pain is intermittent worse with standing/walking and is alleviated with sitting;she does have a back brace that she will wear as needed (has worn it 1x in last several weeks). She denies any numbness/tingling down RLE. She was given gabapentin but couldn't tolerate it because of memory loss; She also tried steroids which helped a little but it made her jittery; She reports nothing helps at night. She reports frequent sleep disturbances, taking tylenol/excedrin to help manage symptoms; She typically sleeps flat on back, has increased pain in sidelying and is unable to tolerate right sidelying; She reports multiple falls with 2 significant falls in last several months. In addition, she reports significant impairement to memory which she attributes to 2nd surgery and prolonged anesthesia;    Limitations Standing;Walking    How long can you sit comfortably? as long as needed, but increased pain upon standing;    How long can you stand comfortably? 20 min;    How long can you walk comfortably? 5-32mn    Patient Stated Goals "Get rid of pain."  Pain Onset More than a month ago    Pain Onset More than a month ago                    [facility] PT Assessment - 01/27/22 0001       Functional Gait  Assessment   Gait Level Surface Walks 20 ft in less than 5.5 sec, no assistive devices, good speed, no evidence for imbalance, normal gait pattern, deviates no more than 6 in outside of the 12 in walkway width.    Change in Gait Speed Able to smoothly change walking speed without loss of balance or gait deviation. Deviate no more than 6 in outside of the 12 in walkway width.    Gait with Horizontal Head Turns Performs head turns smoothly with slight change in gait velocity (eg, minor disruption to smooth gait path), deviates 6-10 in  outside 12 in walkway width, or uses an assistive device.    Gait with Vertical Head Turns Performs head turns with no change in gait. Deviates no more than 6 in outside 12 in walkway width.    Gait and Pivot Turn Pivot turns safely within 3 sec and stops quickly with no loss of balance.    Step Over Obstacle Is able to step over one shoe box (4.5 in total height) without changing gait speed. No evidence of imbalance.    Gait with Narrow Base of Support Ambulates 4-7 steps.    Gait with Eyes Closed Walks 20 ft, no assistive devices, good speed, no evidence of imbalance, normal gait pattern, deviates no more than 6 in outside 12 in walkway width. Ambulates 20 ft in less than 7 sec.    Ambulating Backwards Walks 20 ft, no assistive devices, good speed, no evidence for imbalance, normal gait    Steps Alternating feet, must use rail.    Total Score 25               Physical therapy treatment session today consisted of completing assessment of goals and administration of testing as demonstrated in flow sheet. Addition treatments may be found below.     TREATMENT: Hooklying: Legs apart trunk rotation, windshield wiper x10 reps each direction; Cross leg hip IT band stretch 20 sec hold x2 reps;    Instructed patient in LE strengthening: Hooklying: Patient required min-moderate verbal/tactile cues for correct exercise technique including to increase core stabilization during LE exercise;  Green tband around BLE: -hip abduction/ER x15 reps       Pt transitions to L SL Sidelying: Clamshell x 10 GTB  Hip abduction SLR 2#, 2*10 reps RLE; Hip abduction SLR circles clockwise/counterclockwise 2#, 2x5 reps each direction R LE  Patient does continue to have increased weakness in BLE which contributes to some soreness.    Rationale for Evaluation and Treatment Rehabilitation                       PT Education - 01/27/22 1025     Education Details Therapeutic progress     Person(s) Educated Patient    Methods Explanation    Comprehension Verbalized understanding              PT Short Term Goals - 01/27/22 1025       PT SHORT TERM GOAL #1   Title Patient will be adherent to HEP at least 3x a week to improve functional strength and balance for better safety at home.    Baseline 6/15: has  been doing exercises irregularly    Time 4    Period Weeks    Status Partially Met    Target Date 12/03/21      PT SHORT TERM GOAL #2   Title Patient will report maximum of 1 sleep disturbance per night for better quality of life and improved sleep tolerance;    Baseline sleep disturbed less but this week has been more effected; 5/4: still waking up- using a pillow which helps, 1x a night 6/15: Pt reports her leg is still hurting her and waking her up once per night usually    Time 4    Period Weeks    Status Not Met    Target Date 12/03/21               PT Long Term Goals - 01/27/22 1027       PT LONG TERM GOAL #1   Title Patient will increase Functional Gait Assessment score to >20/30 as to reduce fall risk and improve dynamic gait safety with community ambulation.    Baseline 5/4: deferred    Time 8    Period Weeks    Status Achieved    Target Date 02/10/22      PT LONG TERM GOAL #2   Title Patient will increase RLE gross strength to 4+/5 as to improve functional strength for independent gait, increased standing tolerance and increased ADL ability.    Baseline 4/5 with hip flexion and abduction    Time 8    Period Weeks    Status Partially Met    Target Date 02/10/22      PT LONG TERM GOAL #3   Title Patient will report a worst pain of 3/10 on VAS in     right hip        to improve tolerance with ADLs and reduced symptoms with activities.    Baseline 5-6/10 in hip this week but had a decrease in pain for a while. 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    Baseline FOTO from 48 at IE to 3 on 11/05/21    Time 8    Period Weeks    Status Achieved    Target Date 02/10/22      PT LONG TERM GOAL #6   Title Patient will improve functional gait assessment score to 26 out of 30 or better in order to improve her fall risk as well as improve her functional mobility    Baseline 23 of 30 on 11/05/2021, 5/4: deferred;    Time 8    Period Weeks    Status On-going    Target Date 02/10/22      PT LONG TERM GOAL #7   Title Patient will report and demonstrate ability to complete  line dancing activities in order to improve her ability to participate in recreational community activities and exercise    Baseline Patient not competent with several Aline August activities and feels her right lower extremity gives way at times, 5/4: too wobbly 6/15: still not line dancing, has not tried in some time, i not sure her R leg when cooroprate when trying    Time 8    Period Weeks    Status On-going    Target Date 02/10/22                   Plan - 01/27/22 1316     Clinical Impression Statement Patient presents to physical therapy for physical therapy progress note.  Progress during this reporting period has been limited secondary to patient having to frequently miss appointments due to her mother being sick and frequent travels from [place] to [place].  Patient is continuing to have hip pain at night as well as pain in her lateral right lower extremity.  Patient also continues to report fatigue with  activities involving prolonged standing or walking when she has to perform caretaking activities.  Patient is still currently limited and high-level balance activities as well as her competence with ability to complete in line dancing activities for exercise and recreation.  Patient will continue to benefit from skilled physical therapy in order to improve her lower extremity strength, endurance, pain, quality of life. Patient's condition has the potential to improve in response to therapy. Maximum improvement is yet to be obtained. The anticipated improvement is attainable and reasonable in a generally predictable time.      Personal Factors and Comorbidities Comorbidity 3+;Past/Current Experience;Time since onset of injury/illness/exacerbation    Comorbidities HTN(variable), pre-diabetic, past spinal fusion and revision, anxiety/depression (somewhat controlled), recent falls    Examination-Activity Limitations Bed Mobility;Locomotion Level;Sleep;Squat;Stairs;Stand;Transfers    Examination-Participation Restrictions Cleaning;Community Activity;Shop;Volunteer;Yard Work    [employer]chant [employer] officer Evolving/Moderate complexity    Rehab Potential Fair    PT Frequency 2x / week    PT Duration 8 weeks    PT Treatment/Interventions ADLs/Self Care Home Management;Cryotherapy;Electrical Stimulation;Moist Heat;Gait training;Stair training;Functional mobility training;Therapeutic activities;Therapeutic exercise;Balance training;Neuromuscular re-education;Patient/family education;Manual techniques;Dry needling;Energy conservation;Passive range of motion;Joint Manipulations    PT Next Visit Plan balance progression    PT Home Exercise Plan updated 11/09/21    Consulted and Agree with Plan of Care Patient             Patient will benefit from skilled therapeutic intervention in order to improve the following deficits and impairments:  Decreased range of motion, Difficulty walking, Decreased activity  tolerance, Pain, Hypermobility, Decreased balance, Decreased strength, Decreased mobility  Visit Diagnosis: Difficulty in walking, not elsewhere classified  Unsteadiness on feet  Pain in right hip  Muscle weakness (generalized)     Problem List Patient Active Problem List   Diagnosis Date Noted   S/P lumbar fusion 11/12/2020   Chronic venous insufficiency 05/07/2019   History of 2019 novel coronavirus disease (COVID-19) 05/07/2019   Chronic left-sided low back pain with left-sided sciatica 07/02/2018   OAB (overactive bladder) 04/02/2018   CKD (chronic kidney disease), symptom management only, stage 2 (mild) 09/24/2017   Encounter for Medicare annual wellness exam 06/02/2015   Glaucoma 06/02/2015   Vitamin D deficiency 10/21/2014   Medication management 12/01/2013   Hyperlipidemia, mixed    Essential hypertension    Abnormal glucose    Migraines    Allergy    Anxiety    Depression, major, recurrent (  [place])    Lumbar stenosis     Particia Lather, PT 01/27/2022, 1:19 PM  [place] MAIN [facility] SERVICES [address] [place], [place], [address] Phone: [phone]   Fax:  [phone]  Name: Rebekah Hamilton MRN: [id number] Date of Birth: [birth year]

## 2022-02-01 ENCOUNTER — Encounter: Payer: Self-pay | Admitting: Physical Therapy

## 2022-02-01 ENCOUNTER — Ambulatory Visit: Payer: Medicare Other | Admitting: Physical Therapy

## 2022-02-01 DIAGNOSIS — M25551 Pain in right hip: Secondary | ICD-10-CM | POA: Diagnosis not present

## 2022-02-01 DIAGNOSIS — M6281 Muscle weakness (generalized): Secondary | ICD-10-CM | POA: Diagnosis not present

## 2022-02-01 DIAGNOSIS — R2681 Unsteadiness on feet: Secondary | ICD-10-CM | POA: Diagnosis not present

## 2022-02-01 DIAGNOSIS — R262 Difficulty in walking, not elsewhere classified: Secondary | ICD-10-CM

## 2022-02-01 NOTE — Therapy (Signed)
[place] MAIN [facility] SERVICES [address] [place], [place], [address] Phone: [phone]   Fax:  [phone]  Physical Therapy Treatment  Patient Details  Name: Rebekah Hamilton MRN: [id number] Date of Birth: 09/08/1945 Referring Provider (PT): Dr. Louanne Skye   Encounter Date: 02/01/2022   PT End of Session - 02/01/22 1022     Visit Number 21    Number of Visits 33    Date for PT Re-Evaluation 02/10/22    Authorization Type Medicare    Progress Note Due on Visit 20    PT Start Time 1018    PT Stop Time 1100    PT Time Calculation (min) 42 min    Activity Tolerance Patient tolerated treatment well    Behavior During Therapy [facility] for tasks assessed/performed             Past Medical History:  Diagnosis Date   Allergy    Anemia    Anxiety    Depression    Hyperlipidemia    Hypertension    Lumbar stenosis    Migraines    Prediabetes     Past Surgical History:  Procedure Laterality Date   BREAST LUMPECTOMY Bilateral    CHOLECYSTECTOMY  1999   SPINE SURGERY      There were no vitals filed for this visit.   Subjective Assessment - 02/01/22 1020     Subjective Pt reports she has been having to cut back on Lyrica as she can't tolerate side effects. She reports it does help with her pain but it makes me so tired.    Pertinent History 76 yo Female reports RLE weakness, imbalance and decreased walking ability. Her PMH is significant:  She is s/p posterior lumbar interbody fusion L3-L4 with revision of fractured rod on 01-29-21 and noted s/p T9-S2 performed on 12-23-19. She reports after 2nd surgery, the surgeon did something to affect the nerve in her right hip; She reports since the surgery she hasn't been back to the surgeon because he is [facility] and that's too far. She has been seeing Dr. Louanne Skye in [place] and she will be following up with him. She reports her pain is intermittent worse with standing/walking and is  alleviated with sitting;she does have a back brace that she will wear as needed (has worn it 1x in last several weeks). She denies any numbness/tingling down RLE. She was given gabapentin but couldn't tolerate it because of memory loss; She also tried steroids which helped a little but it made her jittery; She reports nothing helps at night. She reports frequent sleep disturbances, taking tylenol/excedrin to help manage symptoms; She typically sleeps flat on back, has increased pain in sidelying and is unable to tolerate right sidelying; She reports multiple falls with 2 significant falls in last several months. In addition, she reports significant impairement to memory which she attributes to 2nd surgery and prolonged anesthesia;    Limitations Standing;Walking    How long can you sit comfortably? as long as needed, but increased pain upon standing;    How long can you stand comfortably? 20 min;    How long can you walk comfortably? 5-72mn    Patient Stated Goals "Get rid of pain."    Currently in Pain? Yes    Pain Score 2     Pain Location Back    Pain Orientation Lower;Right    Pain Descriptors / Indicators Aching;Sore    Pain Type Chronic pain  Pain Onset More than a month ago    Pain Frequency Intermittent    Aggravating Factors  driving/prolonged activity    Pain Relieving Factors stretches/heat    Effect of Pain on Daily Activities decreased activity tolerance;    Multiple Pain Sites No    Pain Onset More than a month ago                    TREATMENT: Instructed patient in LE stretches to help alleviate stiffness: Hooklying: Legs apart trunk rotation, windshield wiper x10 reps each direction; Cross leg hip IT band stretch 20 sec hold x2 reps;    Instructed patient in LE strengthening: Hooklying: Hip flexion march x10 reps 2# ankle weight, with cues for posterior pelvic tilt; SLR hip flexion 2# x10 reps each LE Suspended heel slides 2# x10 reps each LE Patient  required min-moderate verbal/tactile cues for correct exercise technique including to increase core stabilization during LE exercise;  Green tband around BLE: -hip abduction/ER x15 reps Bridges with arms by side with hips abducted to challenge gluteal activation 2x15 reps;       Sidelying: Hip abduction SLR 2#, 2x10 reps each LE; Hip abduction SLR circles clockwise/counterclockwise 2#, 2x5 reps each direction each LE Does require min VCS for proper positioning/exercise technique for optimal strengthening; She reports slight increase difficulty when moving LLE compared to RLE;    PT uiltized rolling stick to outer hip x5 min to alleviate tightness/soreness, each LE   Hooklying: Piriformis stretch, modified 30 sec hold x1 reps each LE;   Cross leg hip IT band stretch 20 sec hold x1 reps;   Patient does continue to have increased weakness in BLE which contributes to some soreness.    She tolerated session well without significant increase in pain at end of session.       Rationale for Evaluation and Treatment Rehabilitation                            PT Education - 02/01/22 1021     Education Details positioning/exercise technique    Person(s) Educated Patient    Methods Explanation;Verbal cues    Comprehension Verbalized understanding;Returned demonstration;Verbal cues required;Need further instruction              PT Short Term Goals - 01/27/22 1025       PT SHORT TERM GOAL #1   Title Patient will be adherent to HEP at least 3x a week to improve functional strength and balance for better safety at home.    Baseline 6/15: has been doing exercises irregularly    Time 4    Period Weeks    Status Partially Met    Target Date 12/03/21      PT SHORT TERM GOAL #2   Title Patient will report maximum of 1 sleep disturbance per night for better quality of life and improved sleep tolerance;    Baseline sleep disturbed less but this week has been more  effected; 5/4: still waking up- using a pillow which helps, 1x a night 6/15: Pt reports her leg is still hurting her and waking her up once per night usually    Time 4    Period Weeks    Status Not Met    Target Date 12/03/21               PT Long Term Goals - 01/27/22 1027  PT LONG TERM GOAL #1   Title Patient will increase Functional Gait Assessment score to >20/30 as to reduce fall risk and improve dynamic gait safety with community ambulation.    Baseline 5/4: deferred    Time 8    Period Weeks    Status Achieved    Target Date 02/10/22      PT LONG TERM GOAL #2   Title Patient will increase RLE gross strength to 4+/5 as to improve functional strength for independent gait, increased standing tolerance and increased ADL ability.    Baseline 4/5 with hip flexion and abduction    Time 8    Period Weeks    Status Partially Met    Target Date 02/10/22      PT LONG TERM GOAL #3   Title Patient will report a worst pain of 3/10 on VAS in     right hip        to improve tolerance with ADLs and reduced symptoms with activities.    Baseline 5-6/10 in hip this week but had a decrease in pain for a while. 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    Baseline FOTO from 48 at IE to 53 on 11/05/21    Time 8    Period Weeks    Status Achieved    Target Date 02/10/22      PT LONG TERM GOAL #6   Title Patient will improve functional gait assessment  score to 26 out of 30 or better in order to improve her fall risk as well as improve her functional mobility    Baseline 23 of 30 on 11/05/2021, 5/4: deferred;    Time 8    Period Weeks    Status On-going    Target Date 02/10/22      PT LONG TERM GOAL #7   Title Patient will report and demonstrate ability to complete line dancing activities in order to improve her ability to participate in recreational community activities and exercise    Baseline Patient not competent with several [place] activities and feels her right lower extremity gives way at times, 5/4: too wobbly 6/15: still not line dancing, has not tried in some time, i not sure her R leg when cooroprate when trying    Time 8    Period Weeks    Status On-going    Target Date 02/10/22                   Plan - 02/01/22 1043     Clinical Impression Statement Patient motivated and participated well. She reports overall feeling less pain. She reports the Lyrica helped but it does make her fatigued. She plans to cut down on frequency to help improve tolerance. Patient instructed in advanced LE  strengthening. She does require min VCS for proper exercise. She continues to have weakness in BLE (R>L). She does require min VCs for proper exercise technique. She tolerated session well without increase in symptoms. She would benefit from additional skilled PT Intervention to improve strength and mobility while reducing pain;    Personal Factors and Comorbidities Comorbidity 3+;Past/Current Experience;Time since onset of injury/illness/exacerbation    Comorbidities HTN(variable), pre-diabetic, past spinal fusion and revision, anxiety/depression (somewhat controlled), recent falls    Examination-Activity Limitations Bed Mobility;Locomotion Level;Sleep;Squat;Stairs;Stand;Transfers    Examination-Participation Restrictions Cleaning;Community Activity;Shop;Volunteer;Yard Work    [employer]chant [employer] officer Evolving/Moderate complexity     Rehab Potential Fair    PT Frequency 2x / week    PT Duration 8 weeks    PT Treatment/Interventions ADLs/Self Care Home Management;Cryotherapy;Electrical Stimulation;Moist Heat;Gait training;Stair training;Functional mobility training;Therapeutic activities;Therapeutic exercise;Balance training;Neuromuscular re-education;Patient/family education;Manual techniques;Dry needling;Energy conservation;Passive range of motion;Joint Manipulations    PT Next Visit Plan balance progression    PT Home Exercise Plan updated 11/09/21    Consulted and Agree with Plan of Care Patient             Patient will benefit from skilled therapeutic intervention in order to improve the following deficits and impairments:  Decreased range of motion, Difficulty walking, Decreased activity tolerance, Pain, Hypermobility, Decreased balance, Decreased strength, Decreased mobility  Visit Diagnosis: Difficulty in walking, not elsewhere classified  Unsteadiness on feet  Pain in right hip  Muscle weakness (generalized)     Problem List Patient Active Problem List   Diagnosis Date Noted   S/P lumbar fusion 11/12/2020   Chronic venous insufficiency 05/07/2019   History of 2019 novel coronavirus disease (COVID-19) 05/07/2019   Chronic left-sided low back pain with left-sided sciatica 07/02/2018   OAB (overactive bladder) 04/02/2018   CKD (chronic kidney disease), symptom management only, stage 2 (mild) 09/24/2017   Encounter for Medicare annual wellness exam 06/02/2015   Glaucoma 06/02/2015   Vitamin D deficiency 10/21/2014   Medication management 12/01/2013   Hyperlipidemia, mixed    Essential hypertension    Abnormal glucose    Migraines    Allergy    Anxiety    Depression, major, recurrent ([place])    Lumbar stenosis     Tenoch Mcclure, PT, DPT 02/01/2022, 10:59 AM  [place], [place], [address] Phone: [phone]    Fax:  [phone]  Name: Ahonesty Woodfin MRN: [id number] Date of Birth: January 20, 1946

## 2022-02-03 ENCOUNTER — Ambulatory Visit: Payer: Medicare Other | Admitting: Physical Therapy

## 2022-02-03 DIAGNOSIS — M25551 Pain in right hip: Secondary | ICD-10-CM | POA: Diagnosis not present

## 2022-02-03 DIAGNOSIS — M6281 Muscle weakness (generalized): Secondary | ICD-10-CM

## 2022-02-03 DIAGNOSIS — R2681 Unsteadiness on feet: Secondary | ICD-10-CM

## 2022-02-03 DIAGNOSIS — R262 Difficulty in walking, not elsewhere classified: Secondary | ICD-10-CM | POA: Diagnosis not present

## 2022-02-03 NOTE — Therapy (Signed)
OUTPATIENT PHYSICAL THERAPY TREATMENT NOTE   Patient Name: Rebekah Hamilton MRN: [id number] DOB:[birth year], 76 y.o., female Today's Date: 02/03/2022  PCP: Unk Pinto MD REFERRING PROVIDER: Basil Dess MD   PT End of Session - 02/03/22 1021     Visit Number 22    Number of Visits 76    Date for PT Re-Evaluation 02/10/22    Authorization Type Medicare    Progress Note Due on Visit 90    PT Start Time 1018    PT Stop Time 1100    PT Time Calculation (min) 42 min    Activity Tolerance Patient tolerated treatment well    Behavior During Therapy WFL for tasks assessed/performed             Past Medical History:  Diagnosis Date   Allergy    Anemia    Anxiety    Depression    Hyperlipidemia    Hypertension    Lumbar stenosis    Migraines    Prediabetes    Past Surgical History:  Procedure Laterality Date   BREAST LUMPECTOMY Bilateral    CHOLECYSTECTOMY  1999   SPINE SURGERY     Patient Active Problem List   Diagnosis Date Noted   S/P lumbar fusion 11/12/2020   Chronic venous insufficiency 05/07/2019   History of 2019 novel coronavirus disease (COVID-19) 05/07/2019   Chronic left-sided low back pain with left-sided sciatica 07/02/2018   OAB (overactive bladder) 04/02/2018   CKD (chronic kidney disease), symptom management only, stage 2 (mild) 09/24/2017   Encounter for Medicare annual wellness exam 06/02/2015   Glaucoma 06/02/2015   Vitamin D deficiency 10/21/2014   Medication management 12/01/2013   Hyperlipidemia, mixed    Essential hypertension    Abnormal glucose    Migraines    Allergy    Anxiety    Depression, major, recurrent (HCC)    Lumbar stenosis     REFERRING DIAG: back/right hip pain;   THERAPY DIAG:  Difficulty in walking, not elsewhere classified  Unsteadiness on feet  Pain in right hip  Muscle weakness (generalized)  Rationale for Evaluation and Treatment Rehabilitation  PERTINENT HISTORY: 76 yo Female reports RLE  weakness, imbalance and decreased walking ability. Her PMH is significant: She is s/p posterior lumbar interbody fusion L3-L4 with revision of fractured rod on 01-29-21 and noted s/p T9-S2 performed on 12-23-19. She reports after 2nd surgery, the surgeon did something to affect the nerve in her right hip; She reports since the surgery she hasn't been back to the surgeon because he is [facility] and that's too far. She has been seeing Dr. Louanne Skye in [place] and she will be following up with him. She reports her pain is intermittent worse with standing/walking and is alleviated with sitting;she does have a back brace that she will wear as needed (has worn it 1x in last several weeks). She denies any numbness/tingling down RLE. She was given gabapentin but couldn't tolerate it because of memory loss; She also tried steroids which helped a little but it made her jittery; She reports nothing helps at night. She reports frequent sleep disturbances, taking tylenol/excedrin to help manage symptoms; She typically sleeps flat on back, has increased pain in sidelying and is unable to tolerate right sidelying; She reports multiple falls with 2 significant falls in last several months. In addition, she reports significant impairement to memory which she attributes to 2nd surgery and prolonged anesthesia;  PRECAUTIONS: fall/wears spinal brace prn  SUBJECTIVE: Pt reports doing okay.  Her pain is still minimal. She reports the Lyrica helps but it does make her spacey (fatigue/forgetful)  PAIN:  Are you having pain? Yes: NPRS scale: 2-3/10 Pain location: low back Pain description: sharp initially and then is more dull Aggravating factors: certain movement/prolonged activity Relieving factors: rest/stretch     TODAY'S TREATMENT:    TREATMENT: Instructed patient in LE stretches to help alleviate stiffness: Hooklying: Legs apart trunk rotation, windshield wiper x10 reps each direction; Cross leg hip IT band  stretch 20 sec hold x2 reps;  PT performed SLR hamstring stretch 20 sec hold  Progressed to ankle DF/PF neural stretch x10 reps;   Instructed patient in LE strengthening: Hooklying: Hip flexion march x10 reps 2# ankle weight, with cues for posterior pelvic tilt; SLR hip flexion 2# x10 reps each LE Suspended heel slides 2# x10 reps each LE BLE hip flexion/knee flexed at 90, 2# ankle weight 5 sec hold x5 reps  Patient required min-moderate verbal/tactile cues for correct exercise technique including to increase core stabilization during LE exercise;  Green tband around BLE: -hip abduction/ER x15 reps Bridges with arms by side with hips abducted to challenge gluteal activation x15 reps;       Sidelying: Hip abduction clamshell green tband 2x10 reps;  Does require min VCS for proper positioning/exercise technique for optimal strengthening;   Hooklying: Windshield wiper x10 reps;  Piriformis stretch, modified 30 sec hold x1 reps each LE;  Cross leg hip IT band stretch 20 sec hold x1 reps;    Patient does continue to have increased weakness in BLE which contributes to some soreness.    She tolerated session well without significant increase in pain at end of session.                      PATIENT EDUCATION: Education details: Psychologist, occupational Person educated: Patient Education method: Explanation and Verbal cues Education comprehension: verbalized understanding, returned demonstration, and needs further education   HOME EXERCISE PROGRAM: Advanced exercise- see below:  Access Code: [email or web address] URL: [email or web address] Date: 02/03/2022 Prepared by: Blanche East  Exercises - Standing Single Leg Stance with Counter Support  - 1 x daily - 7 x weekly - 1 sets - 2-3 reps - 60 sec hold - Supine ITB Stretch  - 1 x daily - 7 x weekly - 1 sets - 2-3 reps - 15 sec hold - Seated Figure 4 Piriformis Stretch  - 1 x daily - 7 x weekly - 1 sets - 2-3  reps - 15-30 sec hold - Supine Bridge with Resistance Band  - 1 x daily - 7 x weekly - 2 sets - 10 reps - Clamshell with Resistance  - 1 x daily - 7 x weekly - 2 sets - 10 reps   PT Short Term Goals - 01/27/22 1025       PT SHORT TERM GOAL #1   Title Patient will be adherent to HEP at least 3x a week to improve functional strength and balance for better safety at home.    Baseline 6/15: has been doing exercises irregularly    Time 4    Period Weeks    Status Partially Met    Target Date 12/03/21      PT SHORT TERM GOAL #2   Title Patient will report maximum of 1 sleep disturbance per night for better quality of life and improved sleep tolerance;    Baseline sleep disturbed less but this week has been more effected; 5/4:  still waking up- using a pillow which helps, 1x a night 6/15: Pt reports her leg is still hurting her and waking her up once per night usually    Time 4    Period Weeks    Status Not Met    Target Date 12/03/21              PT Long Term Goals - 01/27/22 1027       PT LONG TERM GOAL #1   Title Patient will increase Functional Gait Assessment score to >20/30 as to reduce fall risk and improve dynamic gait safety with community ambulation.    Baseline 5/4: deferred    Time 8    Period Weeks    Status Achieved    Target Date 02/10/22      PT LONG TERM GOAL #2   Title Patient will increase RLE gross strength to 4+/5 as to improve functional strength for independent gait, increased standing tolerance and increased ADL ability.    Baseline 4/5 with hip flexion and abduction    Time 8    Period Weeks    Status Partially Met    Target Date 02/10/22      PT LONG TERM GOAL #3   Title Patient will report a worst pain of 3/10 on VAS in     right hip        to improve tolerance with ADLs and reduced symptoms with activities.    Baseline 5-6/10 in hip this week but had a decrease in pain for a while. 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    Baseline FOTO from 48 at IE to 53 on 11/05/21    Time 8    Period Weeks    Status Achieved    Target Date 02/10/22      PT LONG TERM GOAL #6   Title Patient will improve functional gait assessment score to 26 out of 30 or better in order to improve her fall risk as well as improve her functional mobility    Baseline 23 of 30 on 11/05/2021, 5/4: deferred;    Time 8    Period Weeks    Status On-going    Target Date 02/10/22      PT LONG TERM GOAL #7   Title Patient will report and demonstrate ability to complete line dancing activities in order to improve her ability to participate in recreational community activities and exercise    Baseline Patient not competent with several [place] activities and feels her right lower extremity gives way at times, 5/4: too wobbly 6/15: still not line dancing, has not tried in some time, i not sure her R leg when cooroprate when trying    Time 8    Period Weeks  Status On-going    Target Date 02/10/22              Plan - 02/03/22 1051     Clinical Impression Statement Patient motivated and participated well. She reports overall feeling less pain. She reports the Lyrica helped but it does make her fatigued. She plans to cut down on frequency to help improve tolerance. Patient instructed in advanced LE strengthening. She does require min VCS for proper exercise. She continues to have weakness in BLE (R>L). She does require min VCs for  proper exercise technique.Advanced HEP with hip strengthening;  She tolerated session well without increase in symptoms. She would benefit from additional skilled PT Intervention to improve strength and mobility while reducing pain;    Personal Factors and Comorbidities Comorbidity 3+;Past/Current Experience;Time since onset of injury/illness/exacerbation    Comorbidities HTN(variable), pre-diabetic, past spinal fusion and revision, anxiety/depression (somewhat controlled), recent falls    Examination-Activity Limitations Bed Mobility;Locomotion Level;Sleep;Squat;Stairs;Stand;Transfers    Examination-Participation Restrictions Cleaning;Community Activity;Shop;Volunteer;Yard Work    Merchant [employer] officer Evolving/Moderate complexity    Rehab Potential Fair    PT Frequency 2x / week    PT Duration 8 weeks    PT Treatment/Interventions ADLs/Self Care Home Management;Cryotherapy;Electrical Stimulation;Moist Heat;Gait training;Stair training;Functional mobility training;Therapeutic activities;Therapeutic exercise;Balance training;Neuromuscular re-education;Patient/family education;Manual techniques;Dry needling;Energy conservation;Passive range of motion;Joint Manipulations    PT Next Visit Plan balance progression    PT Home Exercise Plan updated 11/09/21    Consulted and Agree with Plan of Care Patient               Hutch Rhett, PT, DPT 02/03/2022, 10:54 AM

## 2022-02-08 ENCOUNTER — Ambulatory Visit: Payer: Medicare Other | Admitting: Physical Therapy

## 2022-02-08 ENCOUNTER — Encounter: Payer: Self-pay | Admitting: Physical Therapy

## 2022-02-08 DIAGNOSIS — M25551 Pain in right hip: Secondary | ICD-10-CM

## 2022-02-08 DIAGNOSIS — R2681 Unsteadiness on feet: Secondary | ICD-10-CM | POA: Diagnosis not present

## 2022-02-08 DIAGNOSIS — R262 Difficulty in walking, not elsewhere classified: Secondary | ICD-10-CM

## 2022-02-08 DIAGNOSIS — M6281 Muscle weakness (generalized): Secondary | ICD-10-CM | POA: Diagnosis not present

## 2022-02-08 NOTE — Therapy (Signed)
OUTPATIENT PHYSICAL THERAPY TREATMENT NOTE/Discharge Summary   Patient Name: Rebekah Hamilton MRN: [id number] DOB:Nov 28, 1945, 76 y.o., female Today's Date: 02/08/2022  PCP: [name] MD REFERRING PROVIDER: Vira Browns MD   PT End of Session - 02/08/22 1035     Visit Number 23    Number of Visits 33    Date for PT Re-Evaluation 02/10/22    Authorization Type Medicare    Progress Note Due on Visit 20    PT Start Time 1032    PT Stop Time 1100    PT Time Calculation (min) 28 min    Activity Tolerance Patient tolerated treatment well    Behavior During Therapy WFL for tasks assessed/performed             Past Medical History:  Diagnosis Date   Allergy    Anemia    Anxiety    Depression    Hyperlipidemia    Hypertension    Lumbar stenosis    Migraines    Prediabetes    Past Surgical History:  Procedure Laterality Date   BREAST LUMPECTOMY Bilateral    CHOLECYSTECTOMY  1999   SPINE SURGERY     Patient Active Problem List   Diagnosis Date Noted   S/P lumbar fusion 11/12/2020   Chronic venous insufficiency 05/07/2019   History of 2019 novel coronavirus disease (COVID-19) 05/07/2019   Chronic left-sided low back pain with left-sided sciatica 07/02/2018   OAB (overactive bladder) 04/02/2018   CKD (chronic kidney disease), symptom management only, stage 2 (mild) 09/24/2017   Encounter for Medicare annual wellness exam 06/02/2015   Glaucoma 06/02/2015   Vitamin D deficiency 10/21/2014   Medication management 12/01/2013   Hyperlipidemia, mixed    Essential hypertension    Abnormal glucose    Migraines    Allergy    Anxiety    Depression, major, recurrent (HCC)    Lumbar stenosis     REFERRING DIAG: back/right hip pain;   THERAPY DIAG:  Difficulty in walking, not elsewhere classified  Unsteadiness on feet  Pain in right hip  Muscle weakness (generalized)  Rationale for Evaluation and Treatment Rehabilitation  PERTINENT HISTORY: 76 yo  Female reports RLE weakness, imbalance and decreased walking ability. Her PMH is significant: She is s/p posterior lumbar interbody fusion L3-L4 with revision of fractured rod on 01-29-21 and noted s/p T9-S2 performed on 12-23-19. She reports after 2nd surgery, the surgeon did something to affect the nerve in her right hip; She reports since the surgery she hasn't been back to the surgeon because he is [facility] and that's too far. She has been seeing Dr. Otelia Sergeant in [place] and she will be following up with him. She reports her pain is intermittent worse with standing/walking and is alleviated with sitting;she does have a back brace that she will wear as needed (has worn it 1x in last several weeks). She denies any numbness/tingling down RLE. She was given gabapentin but couldn't tolerate it because of memory loss; She also tried steroids which helped a little but it made her jittery; She reports nothing helps at night. She reports frequent sleep disturbances, taking tylenol/excedrin to help manage symptoms; She typically sleeps flat on back, has increased pain in sidelying and is unable to tolerate right sidelying; She reports multiple falls with 2 significant falls in last several months. In addition, she reports significant impairement to memory which she attributes to 2nd surgery and prolonged anesthesia;  PRECAUTIONS: fall/wears spinal brace prn  SUBJECTIVE: Pt reports doing  okay. Her pain is still minimal. She reports the Lyrica helps but it does make her spacey (fatigue/forgetful)  PAIN:  Are you having pain? Yes: NPRS scale: 2-3/10 Pain location: low back Pain description: sharp initially and then is more dull Aggravating factors: certain movement/prolonged activity Relieving factors: rest/stretch     TODAY'S TREATMENT:    TREATMENT: Instructed patient in LE stretches to help alleviate stiffness: Hooklying: Legs apart trunk rotation, windshield wiper x10 reps each direction; Cross  leg hip IT band stretch 20 sec hold x2 reps;   Instructed patient in outcome measures to address progress towards goals, see below:    [facility] PT Assessment - 02/08/22 0001       Observation/Other Assessments   Focus on Therapeutic Outcomes (FOTO)  54%      Strength   Right Hip Flexion 4/5    Right Hip Extension 4-/5    Right Hip ABduction 4-/5    Left Hip Flexion 4+/5    Left Hip Extension 4/5    Left Hip ABduction 4/5               Reinforced HEP with instruction to focus on hip stretch as well as SLS.  Pt continues to have intermittent pain and LE weakness, however she has requested discharge from PT as she needs to help care for her mom.       PATIENT EDUCATION: Education details: Midwife Person educated: Patient Education method: Explanation and Verbal cues Education comprehension: verbalized understanding, returned demonstration, and needs further education   HOME EXERCISE PROGRAM: Advanced exercise- see below:  Access Code: [email or web address] URL: [email or web address] Date: 02/03/2022 Prepared by: Zettie Pho  Exercises - Standing Single Leg Stance with Counter Support  - 1 x daily - 7 x weekly - 1 sets - 2-3 reps - 60 sec hold - Supine ITB Stretch  - 1 x daily - 7 x weekly - 1 sets - 2-3 reps - 15 sec hold - Seated Figure 4 Piriformis Stretch  - 1 x daily - 7 x weekly - 1 sets - 2-3 reps - 15-30 sec hold - Supine Bridge with Resistance Band  - 1 x daily - 7 x weekly - 2 sets - 10 reps - Clamshell with Resistance  - 1 x daily - 7 x weekly - 2 sets - 10 reps   PT Short Term Goals -       PT SHORT TERM GOAL #1   Title Patient will be adherent to HEP at least 3x a week to improve functional strength and balance for better safety at home.    Baseline 6/15: has been doing exercises irregularly    Time 4    Period Weeks    Status Partially Met    Target Date 12/03/21      PT SHORT TERM GOAL #2   Title Patient will report  maximum of 1 sleep disturbance per night for better quality of life and improved sleep tolerance;    Baseline sleep disturbed less but this week has been more effected; 5/4: still waking up- using a pillow which helps, 1x a night 6/15: Pt reports her leg is still hurting her and waking her up once per night usually 6/27: not waking up if taking Lyrica   Time 4    Period Weeks    Status Achieved;    Target Date 12/03/21              PT Long Term Goals - updated 02/08/22  PT LONG TERM GOAL #1   Title Patient will increase Functional Gait Assessment score to >20/30 as to reduce fall risk and improve dynamic gait safety with community ambulation.    Baseline 5/4: deferred  6/15: 25/30   Time 8    Period Weeks    Status Achieved    Target Date 02/10/22      PT LONG TERM GOAL #2   Title Patient will increase RLE gross strength to 4+/5 as to improve functional strength for independent gait, increased standing tolerance and increased ADL ability.    Baseline 4/5 with hip flexion and abduction 6/27: see note   Time 8    Period Weeks    Status Partially Met    Target Date 02/10/22      PT LONG TERM GOAL #3   Title Patient will report a worst pain of 3/10 on VAS in     right hip        to improve tolerance with ADLs and reduced symptoms with activities.    Baseline 5-6/10 in hip this week but had a decrease in pain for a while. 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    Baseline FOTO from 48 at IE to 53 on 11/05/21    Time 8    Period Weeks    Status Achieved    Target Date 02/10/22      PT LONG TERM GOAL #6   Title Patient will improve functional gait assessment score to 26 out of 30 or better in order to improve her fall risk as well as improve her functional mobility    Baseline 23 of 30 on 11/05/2021, 5/4: deferred; 6/15: 25/30   Time 8    Period Weeks    Status On-going    Target Date 02/10/22      PT LONG TERM GOAL #7   Title Patient will report and demonstrate ability to complete line dancing activities in order to improve her ability to participate in recreational community activities and exercise    Baseline Patient not competent with several [place] activities and feels her right lower extremity gives way at times, 5/4: too wobbly 6/15: still not line dancing, has not tried in some time, i not sure her R leg when cooroprate when trying 6/27: hasn't tried- too much going on;    Time 8    Period Weeks    Status On-going    Target Date 02/10/22              Plan - 02/03/22 1051     Clinical Impression Statement Patient motivated and participated well. She reports overall feeling less pain. She reports the Lyrica helped but it does make her fatigued. She plans to cut down on frequency to help  improve tolerance. She expressed desire for today to be her last appointment as she has to care for her mom. She continues to have weakness in BLE (R>L). Reinforced HEP. Patient has not met all goals but needs to stop PT due to family needs.    Personal Factors and Comorbidities Comorbidity 3+;Past/Current Experience;Time since onset of injury/illness/exacerbation    Comorbidities HTN(variable), pre-diabetic, past spinal fusion and revision, anxiety/depression (somewhat controlled), recent falls    Examination-Activity Limitations Bed Mobility;Locomotion Level;Sleep;Squat;Stairs;Stand;Transfers     Examination-Participation Restrictions Cleaning;Community Activity;Shop;Volunteer;Yard Work    Conservation officer, historic buildings Evolving/Moderate complexity    Rehab Potential Fair    PT Frequency 2x / week    PT Duration 8 weeks    PT Treatment/Interventions ADLs/Self Care Home Management;Cryotherapy;Electrical Stimulation;Moist Heat;Gait training;Stair training;Functional mobility training;Therapeutic activities;Therapeutic exercise;Balance training;Neuromuscular re-education;Patient/family education;Manual techniques;Dry needling;Energy conservation;Passive range of motion;Joint Manipulations    PT Next Visit Plan balance progression    PT Home Exercise Plan updated 11/09/21    Consulted and Agree with Plan of Care Patient               Bradie Lacock, PT, DPT 02/08/2022, 10:39 AM

## 2022-02-10 ENCOUNTER — Ambulatory Visit: Payer: Medicare Other | Admitting: Physical Therapy

## 2022-02-17 ENCOUNTER — Ambulatory Visit: Payer: Medicare Other | Admitting: Physical Therapy

## 2022-02-22 ENCOUNTER — Ambulatory Visit: Payer: Medicare Other | Admitting: Physical Therapy

## 2022-02-24 ENCOUNTER — Ambulatory Visit: Payer: Medicare Other | Admitting: Physical Therapy

## 2022-03-01 ENCOUNTER — Ambulatory Visit: Payer: Medicare Other | Admitting: Physical Therapy

## 2022-03-03 ENCOUNTER — Ambulatory Visit: Payer: Medicare Other | Admitting: Physical Therapy

## 2022-03-08 ENCOUNTER — Ambulatory Visit: Payer: Medicare Other | Admitting: Physical Therapy

## 2022-03-10 ENCOUNTER — Ambulatory Visit: Payer: Medicare Other | Admitting: Physical Therapy

## 2022-03-31 DIAGNOSIS — H35013 Changes in retinal vascular appearance, bilateral: Secondary | ICD-10-CM | POA: Diagnosis not present

## 2022-03-31 DIAGNOSIS — Z961 Presence of intraocular lens: Secondary | ICD-10-CM | POA: Diagnosis not present

## 2022-03-31 DIAGNOSIS — C441222 Squamous cell carcinoma of skin of right lower eyelid, including canthus: Secondary | ICD-10-CM | POA: Diagnosis not present

## 2022-03-31 DIAGNOSIS — H04123 Dry eye syndrome of bilateral lacrimal glands: Secondary | ICD-10-CM | POA: Diagnosis not present

## 2022-04-03 NOTE — Patient Instructions (Signed)

## 2022-04-03 NOTE — Progress Notes (Unsigned)
Future Appointments  Date Time Provider Department  04/04/2022  2:00 PM Unk Pinto, MD [facility]  04/15/2022 10:30 AM Jessy Oto, MD [facility]  06/08/2022             cpe  2:00 PM Unk Pinto, MD [facility]  11/16/2022                wellness  2:30 PM Alycia Rossetti, NP [facility]    History of Present Illness:       This very nice 76 y.o. DWF with HTN, HLD, Pre-Diabetes and Vitamin D Deficiency presenting with c/o "Migraines & Stress" .   Patient has hx/o major Depression and had been on Cymbalta & had Trintellix added.                                                 Patient is followed expectantly for labile HTN  (2018) & BP has been controlled  on HCTZ . Today's BP is at goal - 140/70 .  Patient has had no complaints of any cardiac type chest pain, palpitations, dyspnea Vertell Limber /PND, dizziness, claudication, or dependent edema.       Hyperlipidemia is controlled with diet & Pravastatin. Patient denies myalgias or other med SE's. Last Lipids were at goal :  Lab Results  Component Value Date   CHOL 163 11/12/2020   HDL 56 11/12/2020   LDLCALC 84 11/12/2020   TRIG 126 11/12/2020   CHOLHDL 2.9 11/12/2020    Also, the patient has history of PreDiabetes (A1c 5.8% /2015) and has had no symptoms of reactive hypoglycemia, diabetic polys, paresthesias or visual blurring.  Last A1c was  near goal :  Lab Results  Component Value Date   HGBA1C 5.8 (H) 11/15/2021                                                    Further, the patient also has history of Vitamin D Deficiency ("36" /2016) and supplements vitamin D without any suspected side-effects. Last vitamin D was at goal :  Lab Results  Component Value Date   VD25OH 58 11/12/2020    Current Outpatient Medications  Medication Instructions   ALPRAZolam (XANAX) 0.25 MG tab Take 1/2 to 1 tablet 2 to 3 x / day ONLY if needed for Acute Anxiety Attacks  & please try to limit to 5 days / week to avoid addiction    VITAMIN C  500 mg, 2 times daily   aspirin-apap-caffeine 250-250-65 MG  1 tablet, Oral, Daily   VITAMIN D  Oral, Takes 10000 to 12000 units daily.   DULoxetine (CYMBALTA) 30 MG cap\ Take  3 capsules (90 mg)  Daily  for Mood & Chronic Pain   estradiol (ESTRACE) 0.5 MG tab Take  1 tablet  Daily  for Estrogen Deficiency   fexofenadine (ALLEGRA) 180 mg, Daily   hydrochlorothiazide 25 MG tab Take 1/2 -1 tablet daily for BP and fluid for goal <130/80.   pravastatin (PRAVACHOL) 40 MG tab Take  1 tablet at Bedtime    progesterone (PROMETRIUM) 100 MG cap Take  1 capsule  Daily   rizatriptan (MAXALT) 10 MG tablet Take  1 tablet  for Migraine & may repeat 1 x in 2 hrs -[ Maximum 2 tablets  /24 hours ]   vortioxetine HBr (TRINTELLIX) 5 mg, Oral, Daily      Allergies  Allergen Reactions   Augmentin [Amoxicillin-Pot Clavulanate] Diarrhea   Doxycycline Nausea Only   Egg White (Diagnostic) Nausea Only   Prozac [Fluoxetine Hcl] Other (See Comments)    Disoriented    PMHx:   Past Medical History:  Diagnosis Date   Allergy    Anemia    Anxiety    Depression    Hyperlipidemia    Hypertension    Lumbar stenosis    Migraines    Prediabetes     Immunization History  Administered Date(s) Administered   Influenza, High Dose  05/29/2014, 06/02/2015, 06/16/2017, 05/09/2018, 05/09/2019   Influenza,inj,quad 08/12/2013, 07/04/2016   Influenza 05/25/2020   PFIZER SARS-COV-2 Vacc 09/30/2019, 10/21/2019, 06/22/2020   Pneumococcal -13 04/07/2015   Pneumococcal -23 05/29/2013   Td 08/15/2005, 07/04/2016    Past Surgical History:  Procedure Laterality Date   BREAST LUMPECTOMY Bilateral    CHOLECYSTECTOMY  1999   SPINE SURGERY      FHx:    Reviewed / unchanged  SHx:    Reviewed / unchanged   Systems Review:  Constitutional: Denies fever, chills, wt changes, headaches, insomnia, fatigue, night sweats, change in appetite. Eyes: Denies redness, blurred vision, diplopia, discharge, itchy,  watery eyes.  ENT: Denies discharge, congestion, post nasal drip, epistaxis, sore throat, earache, hearing loss, dental pain, tinnitus, vertigo, sinus pain, snoring.  CV: Denies chest pain, palpitations, irregular heartbeat, syncope, dyspnea, diaphoresis, orthopnea, PND, claudication or edema. Respiratory: denies cough, dyspnea, DOE, pleurisy, hoarseness, laryngitis, wheezing.  Gastrointestinal: Denies dysphagia, odynophagia, heartburn, reflux, water brash, abdominal pain or cramps, nausea, vomiting, bloating, diarrhea, constipation, hematemesis, melena, hematochezia  or hemorrhoids. Genitourinary: Denies dysuria, frequency, urgency, nocturia, hesitancy, discharge, hematuria or flank pain. Musculoskeletal: Denies arthralgias, myalgias, stiffness, jt. swelling, pain, limping or strain/sprain.  Skin: Denies pruritus, rash, hives, warts, acne, eczema or change in skin lesion(s). Neuro: No weakness, tremor, incoordination, spasms, paresthesia or pain. Psychiatric: Denies confusion, memory loss or sensory loss. Endo: Denies change in weight, skin or hair change.  Heme/Lymph: No excessive bleeding, bruising or enlarged lymph nodes.  Physical Exam  There were no vitals taken for this visit.  Appears  well nourished, well groomed  and in no distress.  Eyes: PERRLA, EOMs, conjunctiva no swelling or erythema. Sinuses: No frontal/maxillary tenderness ENT/Mouth: EAC's clear, TM's nl w/o erythema, bulging. Nares clear w/o erythema, swelling, exudates. Oropharynx clear without erythema or exudates. Oral hygiene is good. Tongue normal, non obstructing. Hearing intact.  Neck: Supple. Thyroid not palpable. Car 2+/2+ without bruits, nodes or JVD. Chest: Respirations nl with BS clear & equal w/o rales, rhonchi, wheezing or stridor.  Cor: Heart sounds normal w/ regular rate and rhythm without sig. murmurs, gallops, clicks or rubs. Peripheral pulses normal and equal  without edema.  Abdomen: Soft & bowel sounds  normal. Non-tender w/o guarding, rebound, hernias, masses or organomegaly.  Lymphatics: Unremarkable.  Musculoskeletal: Full ROM all peripheral extremities, joint stability, 5/5 strength and normal gait.  Skin: Warm, dry without exposed rashes, lesions or ecchymosis apparent.  Neuro: Cranial nerves intact, reflexes equal bilaterally. Sensory-motor testing grossly intact. Tendon reflexes grossly intact.  Pysch: Alert & oriented x 3.  Insight and judgement nl & appropriate. No ideations.  Assessment and Plan:  1. Essential hypertension  - Continue medication, monitor blood pressure at home.  - Continue  DASH diet.  Reminder to go to the ER if any CP,  SOB, nausea, dizziness, severe HA, changes vision/speech.    - CBC with Differential/Platelet - COMPLETE METABOLIC PANEL WITH GFR - Magnesium - TSH  2. Hyperlipidemia, mixed  - Continue diet/meds, exercise,& lifestyle modifications.  - Continue monitor periodic cholesterol/liver & renal functions    - Lipid panel - TSH  3. Abnormal glucose  - Continue diet, exercise  - Lifestyle modifications.  - Monitor appropriate labs    - Hemoglobin A1c - Insulin, random  4. Vitamin D deficiency  - Continue supplementation    - VITAMIN D 25 Hydroxy   5. Migraine without aura and without status migrainosus, not intractable   6. Moderate episode of recurrent major depressive disorder (HCC)  - TSH  7. Medication management  - CBC with Differential/Platelet - COMPLETE METABOLIC PANEL WITH GFR - Magnesium - Lipid panel - TSH - Hemoglobin A1c - Insulin, random - VITAMIN D 25 Hydroxy         Discussed  regular exercise, BP monitoring, weight control to achieve/maintain BMI less than 25 and discussed med and SE's. Recommended labs to assess and monitor clinical status with further disposition pending results of labs.  I discussed the assessment and treatment plan with the patient. The patient was provided an opportunity to ask  questions and all were answered. The patient agreed with the plan and demonstrated an understanding of the instructions.  I provided over 30 minutes of exam, counseling, chart review and  complex critical decision making.   Kirtland Bouchard, MD

## 2022-04-03 NOTE — Progress Notes (Incomplete)
Future Appointments  Date Time Provider Department  04/04/2022  2:00 PM Unk Pinto, MD [facility]  04/15/2022 10:30 AM Jessy Oto, MD [facility]  06/08/2022             cpe  2:00 PM Unk Pinto, MD [facility]  11/16/2022                wellness  2:30 PM Alycia Rossetti, NP [facility]      History of Present Illness:       This very nice 76 y.o. DWF with HTN, HLD, Pre-Diabetes and Vitamin D Deficiency presenting with c/o "Migraines & Stress" .  Patient has hx/o major Depression and had been on Cymbalta & had Trintellix added.                                                 Patient is followed expectantly for labile HTN  (2018) & BP has been controlled  on HCTZ . Today's BP is                                         .  Patient has had no complaints of any cardiac type chest pain, palpitations, dyspnea / orthopnea / PND, dizziness, claudication, or dependent edema.       Hyperlipidemia is controlled with diet & Pravastatin. Patient denies myalgias or other med SE's. Last Lipids were at goal :  Lab Results  Component Value Date   CHOL 163 11/12/2020   HDL 56 11/12/2020   LDLCALC 84 11/12/2020   TRIG 126 11/12/2020   CHOLHDL 2.9 11/12/2020    Also, the patient has history of PreDiabetes (A1c 5.8% /2015) and has had no symptoms of reactive hypoglycemia, diabetic polys, paresthesias or visual blurring.  Last A1c was  near goal :  Lab Results  Component Value Date   HGBA1C 5.8 (H) 11/15/2021                                                    Further, the patient also has history of Vitamin D Deficiency ("36" /2016) and supplements vitamin D without any suspected side-effects. Last vitamin D was at goal :  Lab Results  Component Value Date   VD25OH 58 11/12/2020    Current Outpatient Medications  Medication Instructions  . ALPRAZolam (XANAX) 0.25 MG tab Take 1/2 to 1 tablet 2 to 3 x / day ONLY if needed for Acute Anxiety Attacks  & please try to limit to 5 days /  week to avoid addiction  . VITAMIN C  500 mg, 2 times daily  . aspirin-apap-caffeine 250-250-65 MG  1 tablet, Oral, Daily  . VITAMIN D  Oral, Takes 10000 to 12000 units daily.  . DULoxetine (CYMBALTA) 30 MG cap\ Take  3 capsules (90 mg)  Daily  for Mood & Chronic Pain  . estradiol (ESTRACE) 0.5 MG tab Take  1 tablet  Daily  for Estrogen Deficiency  . fexofenadine (ALLEGRA) 180 mg, Daily  . hydrochlorothiazide 25 MG tab Take 1/2 -1 tablet daily for BP and fluid for goal <130/80.  Marland Kitchen  pravastatin (PRAVACHOL) 40 MG tab Take  1 tablet at Bedtime   . progesterone (PROMETRIUM) 100 MG cap Take  1 capsule  Daily  . rizatriptan (MAXALT) 10 MG tablet Take  1 tablet  for Migraine & may repeat 1 x in 2 hrs -[ Maximum 2 tablets  /24 hours ]  . vortioxetine HBr (TRINTELLIX) 5 mg, Oral, Daily      Allergies  Allergen Reactions  . Augmentin [Amoxicillin-Pot Clavulanate] Diarrhea  . Doxycycline Nausea Only  . Egg White (Diagnostic) Nausea Only  . Prozac [Fluoxetine Hcl] Other (See Comments)    Disoriented    PMHx:   Past Medical History:  Diagnosis Date  . Allergy   . Anemia   . Anxiety   . Depression   . Hyperlipidemia   . Hypertension   . Lumbar stenosis   . Migraines   . Prediabetes     Immunization History  Administered Date(s) Administered  . Influenza, High Dose  05/29/2014, 06/02/2015, 06/16/2017, 05/09/2018, 05/09/2019  . Influenza,inj,quad 08/12/2013, 07/04/2016  . Influenza 05/25/2020  . PFIZER SARS-COV-2 Vacc 09/30/2019, 10/21/2019, 06/22/2020  . Pneumococcal -13 04/07/2015  . Pneumococcal -23 05/29/2013  . Td 08/15/2005, 07/04/2016    Past Surgical History:  Procedure Laterality Date  . BREAST LUMPECTOMY Bilateral   . CHOLECYSTECTOMY  1999  . SPINE SURGERY      FHx:    Reviewed / unchanged  SHx:    Reviewed / unchanged   Systems Review:  Constitutional: Denies fever, chills, wt changes, headaches, insomnia, fatigue, night sweats, change in appetite. Eyes:  Denies redness, blurred vision, diplopia, discharge, itchy, watery eyes.  ENT: Denies discharge, congestion, post nasal drip, epistaxis, sore throat, earache, hearing loss, dental pain, tinnitus, vertigo, sinus pain, snoring.  CV: Denies chest pain, palpitations, irregular heartbeat, syncope, dyspnea, diaphoresis, orthopnea, PND, claudication or edema. Respiratory: denies cough, dyspnea, DOE, pleurisy, hoarseness, laryngitis, wheezing.  Gastrointestinal: Denies dysphagia, odynophagia, heartburn, reflux, water brash, abdominal pain or cramps, nausea, vomiting, bloating, diarrhea, constipation, hematemesis, melena, hematochezia  or hemorrhoids. Genitourinary: Denies dysuria, frequency, urgency, nocturia, hesitancy, discharge, hematuria or flank pain. Musculoskeletal: Denies arthralgias, myalgias, stiffness, jt. swelling, pain, limping or strain/sprain.  Skin: Denies pruritus, rash, hives, warts, acne, eczema or change in skin lesion(s). Neuro: No weakness, tremor, incoordination, spasms, paresthesia or pain. Psychiatric: Denies confusion, memory loss or sensory loss. Endo: Denies change in weight, skin or hair change.  Heme/Lymph: No excessive bleeding, bruising or enlarged lymph nodes.  Physical Exam  There were no vitals taken for this visit.  Appears  well nourished, well groomed  and in no distress.  Eyes: PERRLA, EOMs, conjunctiva no swelling or erythema. Sinuses: No frontal/maxillary tenderness ENT/Mouth: EAC's clear, TM's nl w/o erythema, bulging. Nares clear w/o erythema, swelling, exudates. Oropharynx clear without erythema or exudates. Oral hygiene is good. Tongue normal, non obstructing. Hearing intact.  Neck: Supple. Thyroid not palpable. Car 2+/2+ without bruits, nodes or JVD. Chest: Respirations nl with BS clear & equal w/o rales, rhonchi, wheezing or stridor.  Cor: Heart sounds normal w/ regular rate and rhythm without sig. murmurs, gallops, clicks or rubs. Peripheral pulses  normal and equal  without edema.  Abdomen: Soft & bowel sounds normal. Non-tender w/o guarding, rebound, hernias, masses or organomegaly.  Lymphatics: Unremarkable.  Musculoskeletal: Full ROM all peripheral extremities, joint stability, 5/5 strength and normal gait.  Skin: Warm, dry without exposed rashes, lesions or ecchymosis apparent.  Neuro: Cranial nerves intact, reflexes equal bilaterally. Sensory-motor testing grossly intact. Tendon reflexes grossly  intact.  Pysch: Alert & oriented x 3.  Insight and judgement nl & appropriate. No ideations.  Assessment and Plan:  1. Essential hypertension  - Continue medication, monitor blood pressure at home.  - Continue DASH diet.  Reminder to go to the ER if any CP,  SOB, nausea, dizziness, severe HA, changes vision/speech.   - CBC with Differential/Platelet - Magnesium - Lipid panel - COMPLETE METABOLIC PANEL WITH GFR  2. Hyperlipidemia, mixed  - Continue diet/meds, exercise,& lifestyle modifications.  - Continue monitor periodic cholesterol/liver & renal functions    - TSH - COMPLETE METABOLIC PANEL WITH GFR  3. Abnormal glucose  - Continue diet, exercise  - Lifestyle modifications.  - Monitor appropriate labs   - Hemoglobin A1c - Insulin, random  4. Vitamin D deficiency  - Continue supplementation   - VITAMIN D 25 Hydroxy   5. Medication management  - CBC with Differential/Platelet - Magnesium - Lipid panel - TSH - COMPLETE METABOLIC PANEL WITH GFR - Hemoglobin A1c - Insulin, random - VITAMIN D 25 Hydroxy        Discussed  regular exercise, BP monitoring, weight control to achieve/maintain BMI less than 25 and discussed med and SE's. Recommended labs to assess and monitor clinical status with further disposition pending results of labs.  I discussed the assessment and treatment plan with the patient. The patient was provided an opportunity to ask questions and all were answered. The patient agreed with the plan and  demonstrated an understanding of the instructions.  I provided over 30 minutes of exam, counseling, chart review and  complex critical decision making.        The patient was advised to call back or seek an in-person evaluation if the symptoms worsen or if the condition fails to improve as anticipated.   Kirtland Bouchard, MD

## 2022-04-04 ENCOUNTER — Ambulatory Visit (INDEPENDENT_AMBULATORY_CARE_PROVIDER_SITE_OTHER): Payer: Medicare Other | Admitting: Internal Medicine

## 2022-04-04 ENCOUNTER — Encounter: Payer: Self-pay | Admitting: Internal Medicine

## 2022-04-04 VITALS — BP 140/70 | HR 62 | Temp 97.9°F | Resp 17 | Ht 63.5 in | Wt 152.0 lb

## 2022-04-04 DIAGNOSIS — G43009 Migraine without aura, not intractable, without status migrainosus: Secondary | ICD-10-CM | POA: Diagnosis not present

## 2022-04-04 DIAGNOSIS — E559 Vitamin D deficiency, unspecified: Secondary | ICD-10-CM

## 2022-04-04 DIAGNOSIS — E782 Mixed hyperlipidemia: Secondary | ICD-10-CM | POA: Diagnosis not present

## 2022-04-04 DIAGNOSIS — I10 Essential (primary) hypertension: Secondary | ICD-10-CM | POA: Diagnosis not present

## 2022-04-04 DIAGNOSIS — Z79899 Other long term (current) drug therapy: Secondary | ICD-10-CM | POA: Diagnosis not present

## 2022-04-04 DIAGNOSIS — F331 Major depressive disorder, recurrent, moderate: Secondary | ICD-10-CM | POA: Diagnosis not present

## 2022-04-04 DIAGNOSIS — R7309 Other abnormal glucose: Secondary | ICD-10-CM | POA: Diagnosis not present

## 2022-04-06 DIAGNOSIS — E782 Mixed hyperlipidemia: Secondary | ICD-10-CM | POA: Diagnosis not present

## 2022-04-06 DIAGNOSIS — R7309 Other abnormal glucose: Secondary | ICD-10-CM | POA: Diagnosis not present

## 2022-04-06 DIAGNOSIS — E559 Vitamin D deficiency, unspecified: Secondary | ICD-10-CM | POA: Diagnosis not present

## 2022-04-06 DIAGNOSIS — Z79899 Other long term (current) drug therapy: Secondary | ICD-10-CM | POA: Diagnosis not present

## 2022-04-06 DIAGNOSIS — F331 Major depressive disorder, recurrent, moderate: Secondary | ICD-10-CM | POA: Diagnosis not present

## 2022-04-06 DIAGNOSIS — I10 Essential (primary) hypertension: Secondary | ICD-10-CM | POA: Diagnosis not present

## 2022-04-07 LAB — CBC WITH DIFFERENTIAL/PLATELET
Absolute Monocytes: 568 cells/uL (ref 200–950)
Basophils Absolute: 57 cells/uL (ref 0–200)
Basophils Relative: 0.8 %
Eosinophils Absolute: 128 cells/uL (ref 15–500)
Eosinophils Relative: 1.8 %
HCT: 45 % (ref 35.0–45.0)
Hemoglobin: 15.2 g/dL (ref 11.7–15.5)
Lymphs Abs: 1953 cells/uL (ref 850–3900)
MCH: 30.4 pg (ref 27.0–33.0)
MCHC: 33.8 g/dL (ref 32.0–36.0)
MCV: 90 fL (ref 80.0–100.0)
MPV: 10.1 fL (ref 7.5–12.5)
Monocytes Relative: 8 %
Neutro Abs: 4395 cells/uL (ref 1500–7800)
Neutrophils Relative %: 61.9 %
Platelets: 315 10*3/uL (ref 140–400)
RBC: 5 10*6/uL (ref 3.80–5.10)
RDW: 13 % (ref 11.0–15.0)
Total Lymphocyte: 27.5 %
WBC: 7.1 10*3/uL (ref 3.8–10.8)

## 2022-04-07 LAB — COMPLETE METABOLIC PANEL WITH GFR
AG Ratio: 1.8 (calc) (ref 1.0–2.5)
ALT: 19 U/L (ref 6–29)
AST: 23 U/L (ref 10–35)
Albumin: 4.2 g/dL (ref 3.6–5.1)
Alkaline phosphatase (APISO): 56 U/L (ref 37–153)
BUN/Creatinine Ratio: 13 (calc) (ref 6–22)
BUN: 17 mg/dL (ref 7–25)
CO2: 27 mmol/L (ref 20–32)
Calcium: 9.7 mg/dL (ref 8.6–10.4)
Chloride: 106 mmol/L (ref 98–110)
Creat: 1.26 mg/dL — ABNORMAL HIGH (ref 0.60–1.00)
Globulin: 2.4 g/dL (calc) (ref 1.9–3.7)
Glucose, Bld: 89 mg/dL (ref 65–99)
Potassium: 4.5 mmol/L (ref 3.5–5.3)
Sodium: 142 mmol/L (ref 135–146)
Total Bilirubin: 0.5 mg/dL (ref 0.2–1.2)
Total Protein: 6.6 g/dL (ref 6.1–8.1)
eGFR: 45 mL/min/{1.73_m2} — ABNORMAL LOW (ref >=60)

## 2022-04-07 LAB — VITAMIN D 25 HYDROXY (VIT D DEFICIENCY, FRACTURES): Vit D, 25-Hydroxy: 46 ng/mL (ref 30–100)

## 2022-04-07 LAB — LIPID PANEL
Cholesterol: 147 mg/dL (ref <200)
HDL: 43 mg/dL — ABNORMAL LOW (ref >=50)
LDL Cholesterol (Calc): 82 mg/dL (calc)
Non-HDL Cholesterol (Calc): 104 mg/dL (calc) (ref <130)
Total CHOL/HDL Ratio: 3.4 (calc) (ref <5.0)
Triglycerides: 123 mg/dL (ref <150)

## 2022-04-07 LAB — MAGNESIUM: Magnesium: 2.1 mg/dL (ref 1.5–2.5)

## 2022-04-07 LAB — HEMOGLOBIN A1C
Hgb A1c MFr Bld: 5.6 % of total Hgb (ref <5.7)
Mean Plasma Glucose: 114 mg/dL
eAG (mmol/L): 6.3 mmol/L

## 2022-04-07 LAB — INSULIN, RANDOM: Insulin: 20.5 u[IU]/mL — ABNORMAL HIGH

## 2022-04-07 LAB — TSH: TSH: 2.47 mIU/L (ref 0.40–4.50)

## 2022-04-07 NOTE — Progress Notes (Signed)
<><><><><><><><><><><><><><><><><><><><><><><><><><><><><><><><><> <><><><><><><><><><><><><><><><><><><><><><><><><><><><><><><><><> -   Test results slightly outside the reference range are not unusual. If there is anything important, I will review this with you,  otherwise it is considered normal test values.  If you have further questions,  please do not hesitate to contact me at the office or via My Chart.  <><><><><><><><><><><><><><><><><><><><><><><><><><><><><><><><><> <><><><><><><><><><><><><><><><><><><><><><><><><><><><><><><><><>  -  Total Chol = 147   &   LDL Chol = 82    - Both  Excellent   - Very low risk for Heart Attack  / Stroke <><><><><><><><><><><><><><><><><><><><><><><><><><><><><><><><><>  -  A1c     back to Normal   NonDiabetic range - Great ! <><><><><><><><><><><><><><><><><><><><><><><><><><><><><><><><><>  -  Vitamin D = 46   - is low    - Vitamin D goal is between 70-100.   - Please make sure that you are taking your Vitamin D as directed.   - It is very important as a natural anti-inflammatory and helping the  immune system protect against viral infections, like the Covid-19    helping hair, skin, and nails, as well as reducing stroke and  heart attack risk.   - It helps your bones and helps with mood.  - It also decreases numerous cancer risks so please  take it as directed.   - Low Vit D is associated with a 200-300% higher risk for  CANCER   and 200-300% higher risk for HEART   ATTACK  &  STROKE.    - It is also associated with higher death rate at younger ages,   autoimmune diseases like Rheumatoid arthritis, Lupus,  Multiple Sclerosis.     - Also many other serious conditions, like depression, Alzheimer's  Dementia, infertility, muscle aches, fatigue, fibromyalgia   - just to name a few. <><><><><><><><><><><><><><><><><><><><><><><><><><><><><><><><><>  -  All Else - CBC - Kidneys - Electrolytes - Liver - Magnesium & Thyroid     - all  Normal / OK <><><><><><><><><><><><><><><><><><><><><><><><><><><><><><><><><> <><><><><><><><><><><><><><><><><><><><><><><><><><><><><><><><><>

## 2022-04-15 ENCOUNTER — Ambulatory Visit: Payer: Self-pay

## 2022-04-15 ENCOUNTER — Ambulatory Visit (INDEPENDENT_AMBULATORY_CARE_PROVIDER_SITE_OTHER): Payer: Medicare Other | Admitting: Specialist

## 2022-04-15 ENCOUNTER — Encounter: Payer: Self-pay | Admitting: Specialist

## 2022-04-15 VITALS — BP 144/83 | HR 72 | Ht 64.0 in | Wt 155.0 lb

## 2022-04-15 DIAGNOSIS — M7541 Impingement syndrome of right shoulder: Secondary | ICD-10-CM | POA: Diagnosis not present

## 2022-04-15 DIAGNOSIS — M79621 Pain in right upper arm: Secondary | ICD-10-CM

## 2022-04-15 MED ORDER — METHYLPREDNISOLONE ACETATE 40 MG/ML IJ SUSP
40.0000 mg | INTRAMUSCULAR | Status: AC | PRN
  Administered 2022-04-15: 40 mg via INTRA_ARTICULAR

## 2022-04-15 MED ORDER — BUPIVACAINE HCL 0.5 % IJ SOLN
3.0000 mL | INTRAMUSCULAR | Status: AC | PRN
  Administered 2022-04-15: 3 mL via INTRA_ARTICULAR

## 2022-04-15 NOTE — Patient Instructions (Signed)
Plan: Avoid overhead lifting and overhead use of the arms. Do not lift greater than 10 lbs. Tylenol ES one every 6-8 hours for pain and inflamation. Naprosyn. Continue with PT for another 2-3 weeks, then a home exercise program.   Follow-Up Instructions: No follow-ups on file.

## 2022-04-15 NOTE — Progress Notes (Signed)
Office Visit Note   Patient: Rebekah Hamilton           Date of Birth: July 11, 1946           MRN: [id number] Visit Date: 04/15/2022              Requested by: Unk Pinto, [address] [place],  [place] [address] PCP: Unk Pinto, MD   Assessment & Plan: Visit Diagnoses:  1. Pain in right upper arm   2. Impingement syndrome of right shoulder     Plan: Avoid overhead lifting and overhead use of the arms. Do not lift greater than 10 lbs. Tylenol ES one every 6-8 hours for pain and inflamation. Naprosyn. Continue with PT for another 2-3 weeks, then a home exercise program.   Follow-Up Instructions: No follow-ups on file.   Orders:  Orders Placed This Encounter  Procedures  . XR Humerus Right   No orders of the defined types were placed in this encounter.     Procedures: Large Joint Inj: R subacromial bursa on 04/15/2022 12:56 PM Indications: pain Details: 25 G 1.5 in needle, anterolateral approach  Arthrogram: No  Medications: 40 mg methylPREDNISolone acetate 40 MG/ML; 3 mL bupivacaine 0.5 % Outcome: tolerated well, no immediate complications Procedure, treatment alternatives, risks and benefits explained, specific risks discussed. Consent was given by the patient. Immediately prior to procedure a time out was called to verify the correct patient, procedure, equipment, support staff and site/side marked as required. Patient was prepped and draped in the usual sterile fashion.     Clinical Data: No additional findings.   Subjective: Chief Complaint  Patient presents with  . Right Upper Arm - Pain    HPI  Review of Systems  Constitutional: Negative.   HENT: Negative.    Eyes: Negative.   Respiratory: Negative.    Cardiovascular: Negative.   Gastrointestinal: Negative.   Endocrine: Negative.   Genitourinary: Negative.   Musculoskeletal: Negative.   Skin: Negative.   Allergic/Immunologic: Negative.   Neurological:  Negative.   Hematological: Negative.   Psychiatric/Behavioral: Negative.       Objective: Vital Signs: BP (!) 144/83 (BP Location: Left Arm, Patient Position: Sitting)   Pulse 72   Ht '5\' 4"'$  (1.626 m)   Wt 155 lb (70.3 kg)   BMI 26.61 kg/m   Physical Exam Constitutional:      Appearance: She is well-developed.  HENT:     Head: Normocephalic and atraumatic.  Eyes:     Pupils: Pupils are equal, round, and reactive to light.  Pulmonary:     Effort: Pulmonary effort is normal.     Breath sounds: Normal breath sounds.  Abdominal:     General: Bowel sounds are normal.     Palpations: Abdomen is soft.  Musculoskeletal:     Cervical back: Normal range of motion and neck supple.  Skin:    General: Skin is warm and dry.  Neurological:     Mental Status: She is alert and oriented to person, place, and time.  Psychiatric:        Behavior: Behavior normal.        Thought Content: Thought content normal.        Judgment: Judgment normal.    Right Shoulder Exam   Tenderness  The patient is experiencing tenderness in the acromion.  Range of Motion  Active abduction:  abnormal  Passive abduction:  abnormal   Muscle Strength  Abduction: 4/5  Internal rotation:  4/5  External rotation: 4/5  Supraspinatus: 5/5  Subscapularis: 5/5   Tests  Apprehension: positive  Other  Erythema: absent Scars: absent Sensation: normal   Left Shoulder Exam   Muscle Strength  The patient has normal left shoulder strength.      Specialty Comments:  No specialty comments available.  Imaging: No results found.   PMFS History: Patient Active Problem List   Diagnosis Date Noted  . S/P lumbar fusion 11/12/2020  . Chronic venous insufficiency 05/07/2019  . History of 2019 novel coronavirus disease (COVID-19) 05/07/2019  . Chronic left-sided low back pain with left-sided sciatica 07/02/2018  . OAB (overactive bladder) 04/02/2018  . CKD (chronic kidney disease), symptom management  only, stage 2 (mild) 09/24/2017  . Encounter for Medicare annual wellness exam 06/02/2015  . Glaucoma 06/02/2015  . Vitamin D deficiency 10/21/2014  . Medication management 12/01/2013  . Hyperlipidemia, mixed   . Essential hypertension   . Abnormal glucose   . Migraines   . Allergy   . Anxiety   . Depression, major, recurrent ([place])   . Lumbar stenosis    Past Medical History:  Diagnosis Date  . Allergy   . Anemia   . Anxiety   . Depression   . Hyperlipidemia   . Hypertension   . Lumbar stenosis   . Migraines   . Prediabetes     Family History  Problem Relation Age of Onset  . Stroke Mother   . Hypertension Mother   . Heart attack Father 11  . Other Maternal Grandmother        blood clot after surgery  . Heart attack Paternal Grandmother     Past Surgical History:  Procedure Laterality Date  . BREAST LUMPECTOMY Bilateral   . CHOLECYSTECTOMY  1999  . SPINE SURGERY     Social History   Occupational History  . Occupation: Optometrist  Tobacco Use  . Smoking status: Never  . Smokeless tobacco: Never  Vaping Use  . Vaping Use: Never used  Substance and Sexual Activity  . Alcohol use: Yes    Alcohol/week: 2.0 standard drinks of alcohol    Types: 2 Glasses of wine per week  . Drug use: No  . Sexual activity: Not Currently

## 2022-04-19 ENCOUNTER — Telehealth: Payer: Medicare Other | Admitting: Pharmacy Technician

## 2022-05-03 ENCOUNTER — Other Ambulatory Visit: Payer: Self-pay

## 2022-05-05 ENCOUNTER — Ambulatory Visit (INDEPENDENT_AMBULATORY_CARE_PROVIDER_SITE_OTHER): Payer: Medicare Other | Admitting: Specialist

## 2022-05-05 ENCOUNTER — Encounter: Payer: Self-pay | Admitting: Specialist

## 2022-05-05 VITALS — BP 147/85 | HR 65 | Ht 64.0 in | Wt 155.0 lb

## 2022-05-05 DIAGNOSIS — R29898 Other symptoms and signs involving the musculoskeletal system: Secondary | ICD-10-CM

## 2022-05-05 DIAGNOSIS — M79621 Pain in right upper arm: Secondary | ICD-10-CM | POA: Diagnosis not present

## 2022-05-05 DIAGNOSIS — M7541 Impingement syndrome of right shoulder: Secondary | ICD-10-CM

## 2022-05-05 MED ORDER — NAPROXEN 375 MG PO TABS: 375.0000 mg | ORAL_TABLET | Freq: Two times a day (BID) | ORAL | 2 refills | Status: DC

## 2022-05-05 NOTE — Patient Instructions (Signed)
Avoid overhead lifting and overhead use of the arms. Pillows to keep from sleeping directly on the shoulders Limited lifting to less than 10 lbs. Ice or heat for relief. NSAIDs are helpful, such as alleve or motrin. Rest the right shoulder while awaiting results of MRI.  Stretching exercise help and strengthening is helpful to build endurance.

## 2022-05-05 NOTE — Progress Notes (Signed)
Office Visit Note   Patient: Rebekah Hamilton           Date of Birth: July 28, 1946           MRN: [id number] Visit Date: 05/05/2022              Requested by: Unk Pinto, [address] [place],  [place] [address] PCP: Unk Pinto, MD   Assessment & Plan: Visit Diagnoses:  1. Impingement syndrome of right shoulder   2. Weakness of right leg   3. Pain in right upper arm     Plan: Avoid overhead lifting and overhead use of the arms. Pillows to keep from sleeping directly on the shoulders Limited lifting to less than 10 lbs. Ice or heat for relief. NSAIDs are helpful, such as alleve or motrin. Rest the right shoulder while awaiting results of MRI.  Stretching exercise help and strengthening is helpful to build endurance.   Follow-Up Instructions: Return in about 2 weeks (around 05/19/2022) for Return appt with Dr. Marlou Sa for right shoulder weakness and pain post MRI, injection w/o relief..   Orders:  No orders of the defined types were placed in this encounter.  No orders of the defined types were placed in this encounter.     Procedures: No procedures performed   Clinical Data: No additional findings.   Subjective: Chief Complaint  Patient presents with   Right Shoulder - Follow-up    76 year old female right handed with right shoulder pain and weakness, intermittant numbness. Pain with overhead use of the right arm, uses the left arm to reach in closet. No bowel or bladder difficulty. Right leg weakness is better post revision surgery thoracolumbar spine.     Review of Systems  Constitutional: Negative.   HENT: Negative.    Eyes: Negative.   Respiratory: Negative.    Cardiovascular: Negative.   Gastrointestinal: Negative.   Endocrine: Negative.   Genitourinary: Negative.   Musculoskeletal: Negative.   Skin: Negative.   Allergic/Immunologic: Negative.   Neurological: Negative.   Hematological: Negative.    Psychiatric/Behavioral: Negative.       Objective: Vital Signs: BP (!) 147/85 (BP Location: Left Arm, Patient Position: Sitting)   Pulse 65   Ht '5\' 4"'$  (1.626 m)   Wt 155 lb (70.3 kg)   BMI 26.61 kg/m   Physical Exam Constitutional:      Appearance: She is well-developed.  HENT:     Head: Normocephalic and atraumatic.  Eyes:     Pupils: Pupils are equal, round, and reactive to light.  Pulmonary:     Effort: Pulmonary effort is normal.     Breath sounds: Normal breath sounds.  Abdominal:     General: Bowel sounds are normal.     Palpations: Abdomen is soft.  Musculoskeletal:        General: Normal range of motion.     Cervical back: Normal range of motion and neck supple.  Skin:    General: Skin is warm and dry.  Neurological:     Mental Status: She is alert and oriented to person, place, and time.  Psychiatric:        Behavior: Behavior normal.        Thought Content: Thought content normal.        Judgment: Judgment normal.     Ortho Exam  Specialty Comments:  No specialty comments available.  Imaging: No results found.   PMFS History: Patient Active Problem List  Diagnosis Date Noted   S/P lumbar fusion 11/12/2020   Chronic venous insufficiency 05/07/2019   History of 2019 novel coronavirus disease (COVID-19) 05/07/2019   Chronic left-sided low back pain with left-sided sciatica 07/02/2018   OAB (overactive bladder) 04/02/2018   CKD (chronic kidney disease), symptom management only, stage 2 (mild) 09/24/2017   Encounter for Medicare annual wellness exam 06/02/2015   Glaucoma 06/02/2015   Vitamin D deficiency 10/21/2014   Medication management 12/01/2013   Hyperlipidemia, mixed    Essential hypertension    Abnormal glucose    Migraines    Allergy    Anxiety    Depression, major, recurrent ([facility])    Lumbar stenosis    Past Medical History:  Diagnosis Date   Allergy    Anemia    Anxiety    Depression    Hyperlipidemia    Hypertension     Lumbar stenosis    Migraines    Prediabetes     Family History  Problem Relation Age of Onset   Stroke Mother    Hypertension Mother    Heart attack Father 89   Other Maternal Grandmother        blood clot after surgery   Heart attack Paternal Grandmother     Past Surgical History:  Procedure Laterality Date   BREAST LUMPECTOMY Bilateral    CHOLECYSTECTOMY  1999   SPINE SURGERY     Social History   Occupational History   Occupation: Optometrist  Tobacco Use   Smoking status: Never   Smokeless tobacco: Never  Vaping Use   Vaping Use: Never used  Substance and Sexual Activity   Alcohol use: Yes    Alcohol/week: 2.0 standard drinks of alcohol    Types: 2 Glasses of wine per week   Drug use: No   Sexual activity: Not Currently

## 2022-05-18 ENCOUNTER — Ambulatory Visit
Admission: RE | Admit: 2022-05-18 | Discharge: 2022-05-18 | Disposition: A | Discharge status: Home / Self Care | Payer: Medicare Other | Source: Ambulatory Visit | Attending: Specialist | Admitting: Specialist

## 2022-05-18 DIAGNOSIS — M75111 Incomplete rotator cuff tear or rupture of right shoulder, not specified as traumatic: Secondary | ICD-10-CM | POA: Diagnosis not present

## 2022-05-18 DIAGNOSIS — M79621 Pain in right upper arm: Secondary | ICD-10-CM

## 2022-05-18 DIAGNOSIS — M19011 Primary osteoarthritis, right shoulder: Secondary | ICD-10-CM | POA: Diagnosis not present

## 2022-05-18 DIAGNOSIS — M7541 Impingement syndrome of right shoulder: Secondary | ICD-10-CM

## 2022-05-18 DIAGNOSIS — M79601 Pain in right arm: Secondary | ICD-10-CM | POA: Diagnosis not present

## 2022-05-18 DIAGNOSIS — R29898 Other symptoms and signs involving the musculoskeletal system: Secondary | ICD-10-CM

## 2022-05-18 DIAGNOSIS — M7551 Bursitis of right shoulder: Secondary | ICD-10-CM | POA: Diagnosis not present

## 2022-05-26 ENCOUNTER — Ambulatory Visit (INDEPENDENT_AMBULATORY_CARE_PROVIDER_SITE_OTHER): Payer: Medicare Other | Admitting: Orthopedic Surgery

## 2022-05-26 ENCOUNTER — Telehealth: Payer: Self-pay | Admitting: Orthopedic Surgery

## 2022-05-26 ENCOUNTER — Ambulatory Visit (INDEPENDENT_AMBULATORY_CARE_PROVIDER_SITE_OTHER): Payer: Medicare Other

## 2022-05-26 DIAGNOSIS — M5412 Radiculopathy, cervical region: Secondary | ICD-10-CM

## 2022-05-26 DIAGNOSIS — Z23 Encounter for immunization: Secondary | ICD-10-CM | POA: Diagnosis not present

## 2022-05-26 NOTE — Telephone Encounter (Signed)
Order changed to [facility]

## 2022-05-26 NOTE — Telephone Encounter (Signed)
Patient would like her MRI to be sent over to be done at [facility] in [place] being it would be a lot closer for her please advise

## 2022-05-26 NOTE — Telephone Encounter (Signed)
Looks like this pt had referral made by Dr. Marlou Sa today. Please see below pt would like for MRI to be done a  regional

## 2022-05-27 ENCOUNTER — Encounter: Payer: Self-pay | Admitting: Orthopedic Surgery

## 2022-05-27 NOTE — Progress Notes (Signed)
Office Visit Note   Patient: Rebekah Hamilton           Date of Birth: 13-Jul-1946           MRN: [id number] Visit Date: 05/26/2022 Requested by: Unk Pinto, [address] [place],  [place] [id number] PCP: Unk Pinto, MD  Subjective: Chief Complaint  Patient presents with   Other    Scan review    HPI: Rebekah Hamilton Holter is a 76 y.o. female who presents to the office reporting right shoulder and arm pain of 6 months duration.  States that the pain wakes her from sleep at night.  She does have occasional neck pain.  Does not really report any shoulder pain per se but most of her pain is in the lower humeral region.  Reports some pain in the biceps as well as decreased range of motion.  She did have an injection in the subacromial space which only gave her about 20% relief on the day of injection.  Patient feels like this is a nerve problem because it does feel like the pain she had in her right leg from prior back surgery.  Hard for her to go behind her back.  Denies any scapular pain.  She describes history of lumbar fusion surgery by physician at [facility]  Currently taking Tylenol and naproxen.  Has not had a work-up on her neck yet.  Has had an MRI scan of her shoulder which is reviewed with her today.  That does show moderate tendinosis of the supraspinatus tendon with high-grade partial-thickness articular surface tear and possible full-thickness small punctate tear at the anterior aspect of the supraspinatus.  There is also some tendinosis of the long head of the biceps tendon.  Mild bursitis is present..                ROS: All systems reviewed are negative as they relate to the chief complaint within the history of present illness.  Patient denies fevers or chills.  Assessment & Plan: Visit Diagnoses:  1. Radiculopathy, cervical region     Plan: Impression is right humerus pain with not too much pain radiating to the shoulder.  She does  have a component of biceps pain as well which could be related to her moderate biceps tendinosis.  No evidence of adhesive capsulitis at this time on exam.  I think she may be heading for shoulder surgery; however, I would like to check her neck because of the slightly atypical pattern of presentation of the shoulder pain.  I think is possible she could have a radicular component to her symptoms as well.  Plan at this time is MRI cervical spine to evaluate right-sided radiculopathy.  2 views of the cervical spine do show moderate to severe degenerative disc disease with some loss of lordosis.  Follow-up after that study.  Decision at that time will be for or against cervical spine injection if right-sided pathology is present versus consideration of intra-articular glenohumeral joint injection to test in a diagnostic and therapeutic way the presence of symptomatic biceps tendinitis.  Follow-Up Instructions: No follow-ups on file.   Orders:  Orders Placed This Encounter  Procedures   XR Cervical Spine 2 or 3 views   MR Cervical Spine w/o contrast   No orders of the defined types were placed in this encounter.     Procedures: No procedures performed   Clinical Data: No additional findings.  Objective: Vital Signs: There  were no vitals taken for this visit.  Physical Exam:  Constitutional: Patient appears well-developed HEENT:  Head: Normocephalic Eyes:EOM are normal Neck: Normal range of motion Cardiovascular: Normal rate Pulmonary/chest: Effort normal Neurologic: Patient is alert Skin: Skin is warm Psychiatric: Patient has normal mood and affect  Ortho Exam: Ortho exam demonstrates pretty reasonable cervical spine range of motion flexion chin to chest extension 30 degrees rotation 45 degrees bilaterally.  5 out of 5 grip EPL FPL interosseous wrist flexion extension bicep triceps and deltoid strength.  Patient has symmetric external rotation bilaterally with passive range of motion  of 65/95/165.  She does have some popping anteriorly at times with internal/external rotation of that shoulder at 90 degrees of abduction.  Strength is excellent infraspinatus supraspinatus and subscap muscle testing.  Negative O'Brien's testing negative speeds testing.  No definite tenderness of the biceps tendon within the bicipital groove.  No tenderness to palpation in the elbow region.  She is mildly tender around the distal humerus both anteriorly and posteriorly with no masses palpable in that region.  Specialty Comments:  No specialty comments available.  Imaging: No results found.   PMFS History: Patient Active Problem List   Diagnosis Date Noted   S/P lumbar fusion 11/12/2020   Chronic venous insufficiency 05/07/2019   History of 2019 novel coronavirus disease (COVID-19) 05/07/2019   Chronic left-sided low back pain with left-sided sciatica 07/02/2018   OAB (overactive bladder) 04/02/2018   CKD (chronic kidney disease), symptom management only, stage 2 (mild) 09/24/2017   Encounter for Medicare annual wellness exam 06/02/2015   Glaucoma 06/02/2015   Vitamin D deficiency 10/21/2014   Medication management 12/01/2013   Hyperlipidemia, mixed    Essential hypertension    Abnormal glucose    Migraines    Allergy    Anxiety    Depression, major, recurrent (HCC)    Lumbar stenosis    Past Medical History:  Diagnosis Date   Allergy    Anemia    Anxiety    Depression    Hyperlipidemia    Hypertension    Lumbar stenosis    Migraines    Prediabetes     Family History  Problem Relation Age of Onset   Stroke Mother    Hypertension Mother    Heart attack Father 45   Other Maternal Grandmother        blood clot after surgery   Heart attack Paternal Grandmother     Past Surgical History:  Procedure Laterality Date   BREAST LUMPECTOMY Bilateral    CHOLECYSTECTOMY  1999   SPINE SURGERY     Social History   Occupational History   Occupation: Optometrist  Tobacco  Use   Smoking status: Never   Smokeless tobacco: Never  Vaping Use   Vaping Use: Never used  Substance and Sexual Activity   Alcohol use: Yes    Alcohol/week: 2.0 standard drinks of alcohol    Types: 2 Glasses of wine per week   Drug use: No   Sexual activity: Not Currently

## 2022-06-03 ENCOUNTER — Ambulatory Visit
Admission: RE | Admit: 2022-06-03 | Discharge: 2022-06-03 | Disposition: A | Discharge status: Home / Self Care | Payer: Medicare Other | Source: Ambulatory Visit | Attending: Orthopedic Surgery | Admitting: Orthopedic Surgery

## 2022-06-03 DIAGNOSIS — M5020 Other cervical disc displacement, unspecified cervical region: Secondary | ICD-10-CM | POA: Diagnosis not present

## 2022-06-03 DIAGNOSIS — M47812 Spondylosis without myelopathy or radiculopathy, cervical region: Secondary | ICD-10-CM | POA: Diagnosis not present

## 2022-06-03 DIAGNOSIS — M5412 Radiculopathy, cervical region: Secondary | ICD-10-CM | POA: Diagnosis not present

## 2022-06-08 ENCOUNTER — Encounter: Payer: Self-pay | Admitting: Internal Medicine

## 2022-06-08 ENCOUNTER — Ambulatory Visit (INDEPENDENT_AMBULATORY_CARE_PROVIDER_SITE_OTHER): Payer: Medicare Other | Admitting: Internal Medicine

## 2022-06-08 VITALS — BP 130/80 | HR 70 | Temp 97.9°F | Resp 16 | Ht 63.5 in | Wt 155.2 lb

## 2022-06-08 DIAGNOSIS — Z136 Encounter for screening for cardiovascular disorders: Secondary | ICD-10-CM

## 2022-06-08 DIAGNOSIS — R7309 Other abnormal glucose: Secondary | ICD-10-CM | POA: Diagnosis not present

## 2022-06-08 DIAGNOSIS — I10 Essential (primary) hypertension: Secondary | ICD-10-CM

## 2022-06-08 DIAGNOSIS — Z79899 Other long term (current) drug therapy: Secondary | ICD-10-CM | POA: Diagnosis not present

## 2022-06-08 DIAGNOSIS — Z8249 Family history of ischemic heart disease and other diseases of the circulatory system: Secondary | ICD-10-CM

## 2022-06-08 DIAGNOSIS — E782 Mixed hyperlipidemia: Secondary | ICD-10-CM

## 2022-06-08 DIAGNOSIS — E559 Vitamin D deficiency, unspecified: Secondary | ICD-10-CM

## 2022-06-08 DIAGNOSIS — F331 Major depressive disorder, recurrent, moderate: Secondary | ICD-10-CM

## 2022-06-08 MED ORDER — BUPROPION HCL ER (XL) 150 MG PO TB24: ORAL_TABLET | ORAL | 3 refills | Status: DC

## 2022-06-08 NOTE — Patient Instructions (Signed)

## 2022-06-08 NOTE — Progress Notes (Signed)
Comprehensive Evaluation & Examination   Future Appointments  Date Time Provider Department  06/08/2022                 cpe  2:00 PM Unk Pinto, MD [facility]  06/15/2022  1:45 PM Marlou Sa, Tonna Corner, MD [facility]  11/16/2022                     6 mo ov  &  wellness  2:30 PM Alycia Rossetti, NP [facility]  06/14/2023                  cpe  2:00 PM Unk Pinto, MD [facility]             This very nice 76 y.o. DWF presents for a comprehensive evaluation and management of multiple medical co-morbidities.  Patient has been followed for HTN, HLD, Prediabetes and Vitamin D Deficiency. Patient has hx/o Depression controlled & in remission on her Duloxetine.       Labile HTN predates  circa 2018 .  Patient's BP has been controlled at home.  Today's BP is at goal - 130/80 . Patient denies any cardiac symptoms as chest pain, palpitations, shortness of breath, dizziness or ankle swelling.       Patient's hyperlipidemia is controlled with diet Lambert Keto.  Patient denies myalgias or other medication SE's. Last lipids were at goal :  Lab Results  Component Value Date   CHOL 161 02/22/2021   HDL 44 (L) 02/22/2021   LDLCALC 93 02/22/2021   TRIG 144 02/22/2021   CHOLHDL 3.7 02/22/2021         Patient has hx/o prediabetes (A1c 5.8% /2015) and patient denies reactive hypoglycemic symptoms, visual blurring, diabetic polys or paresthesias. Last A1c was normal & at goal :   Lab Results  Component Value Date   HGBA1C 5.3 02/22/2021          Finally, patient has history of Vitamin D Deficiency ("36" /2016)  and last vitamin D was still low (goal 70-100) :   Lab Results  Component Value Date   VD25OH 47 02/22/2021   Current Outpatient Medications  Medication Instructions   ALPRAZolam  0.25 MG tablet Take 1/2 to 1 tablet 2 to 3 x /day ONLY if needed    VITAMIN C    500 mg 2 times daily   EXCEDRIN  250-250-65 MG t 1 tablet Daily   VITAMIN D     Takes 10000 to 12000 units  daily.   DULoxetine 30 MG capsule Take  3 caps (90 mg)  Daily     estradiol 0.5 MG tablet Take  1 tablet  Daily   fexofenadine   180 mg Daily   hctz 25 MG tablet Take 1/2 -1 tablet daily for BP and fluid for goal <130/80.   naproxen 375 mg 2 times daily with meals   pravastatin  40 MG tablet Take  1 tablet at Bedtime    progesterone  100 MG capsule Take  1 capsule  Daily   MAXALT 10 MG tablet Take  1 tablet  for Migraine & may repeat 1 x in 2 hrs     Allergies  Allergen Reactions   Augmentin [Amoxicillin-Pot Clavulanate] Diarrhea   Doxycycline Nausea Only   Egg White (Diagnostic) Nausea Only   Prozac [Fluoxetine Hcl] Other (See Comments)    Disoriented     Past Medical History:  Diagnosis Date   Allergy    Anemia  Anxiety    Depression    Hyperlipidemia    Hypertension    Lumbar stenosis    Migraines    Prediabetes      Health Maintenance  Topic Date Due   Zoster Vaccines- Shingrix (1 of 2) Never done   DEXA SCAN  02/13/2012   Fecal DNA (Cologuard)  10/26/2020   INFLUENZA VACCINE  03/15/2021   COVID-19 Vaccine (5 - Booster-Pfizer series) 04/03/2021   MAMMOGRAM  05/25/2022   TETANUS/TDAP  07/04/2026   Pneumonia Vaccine 57+ Years old  Completed   Hepatitis C Screening  Completed   HPV VACCINES  Aged Out     Immunization History  Administered Date(s) Administered   Influenza, High Dose  08/16/2016, 05/09/2018, 05/09/2019   Influenza,inj,quad 08/12/2013, 07/04/2016   Influenza 05/25/2020   PFIZER SARS-COV-2 Vacc 09/30/2019, 10/21/2019, 06/22/2020, 02/06/2021   PNEUMOCOCCAL - 20 02/06/2021   Pneumococcal - 13 04/07/2015   Pneumococcal -23 05/29/2013   Td 08/15/2005, 07/04/2016    Cologard - 10/26/2017 - Negative Cologard - 04/18/20123- Negative  Last MGM - 06/26/2021 -  at [facility]   Past Surgical History:  Procedure Laterality Date   BREAST LUMPECTOMY Bilateral    CHOLECYSTECTOMY  1999   SPINE SURGERY       Family History  Problem  Relation Age of Onset   Stroke Mother    Hypertension Mother    Heart attack Father 34   Other Maternal Grandmother        blood clot after surgery   Heart attack Paternal Grandmother      Social History   Tobacco Use   Smoking status: Never   Smokeless tobacco: Never  Vaping Use   Vaping Use: Never used  Substance Use Topics   Alcohol use: Yes    Alcohol/week: 2.0 standard drinks    Types: 2 Glasses of wine per week   Drug use: No      ROS Constitutional: Denies fever, chills, weight loss/gain, headaches, insomnia,  night sweats or change in appetite. Does c/o fatigue. Eyes: Denies redness, blurred vision, diplopia, discharge, itchy or watery eyes.  ENT: Denies discharge, congestion, post nasal drip, epistaxis, sore throat, earache, hearing loss, dental pain, Tinnitus, Vertigo, Sinus pain or snoring.  Cardio: Denies chest pain, palpitations, irregular heartbeat, syncope, dyspnea, diaphoresis, orthopnea, PND, claudication or edema Respiratory: denies cough, dyspnea, DOE, pleurisy, hoarseness, laryngitis or wheezing.  Gastrointestinal: Denies dysphagia, heartburn, reflux, water brash, pain, cramps, nausea, vomiting, bloating, diarrhea, constipation, hematemesis, melena, hematochezia, jaundice or hemorrhoids Genitourinary: Denies dysuria, frequency, urgency, nocturia, hesitancy, discharge, hematuria or flank pain Musculoskeletal: Denies arthralgia, myalgia, stiffness, Jt. Swelling, pain, limp or strain/sprain. Denies Falls. Skin: Denies puritis, rash, hives, warts, acne, eczema or change in skin lesion Neuro: No weakness, tremor, incoordination, spasms, paresthesia or pain Psychiatric: Denies confusion, memory loss or sensory loss. Denies Depression. Endocrine: Denies change in weight, skin, hair change, nocturia, and paresthesia, diabetic polys, visual blurring or hyper / hypo glycemic episodes.  Heme/Lymph: No excessive bleeding, bruising or enlarged lymph nodes.   Physical  Exam  BP 130/80   Pulse 70   Temp 97.9 F (36.6 C)   Resp 16   Ht 5' 3.5" (1.613 m)   Wt 155 lb 3.2 oz (70.4 kg)   SpO2 99%   BMI 27.06 kg/m   General Appearance: Well nourished and well groomed and in no apparent distress.  Eyes: PERRLA, EOMs, conjunctiva no swelling or erythema, normal fundi and vessels. Sinuses: No frontal/maxillary tenderness ENT/Mouth: EACs  patent / TMs  nl. Nares clear without erythema, swelling, mucoid exudates. Oral hygiene is good. No erythema, swelling, or exudate. Tongue normal, non-obstructing. Tonsils not swollen or erythematous. Hearing normal.  Neck: Supple, thyroid not palpable. No bruits, nodes or JVD. Respiratory: Respiratory effort normal.  BS equal and clear bilateral without rales, rhonci, wheezing or stridor. Cardio: Heart sounds are normal with regular rate and rhythm and no murmurs, rubs or gallops. Peripheral pulses are normal and equal bilaterally without edema. No aortic or femoral bruits. Chest: symmetric with normal excursions and percussion.  Abdomen: Soft, with Nl bowel sounds. Nontender, no guarding, rebound, hernias, masses, or organomegaly.  Lymphatics: Non tender without lymphadenopathy.  Musculoskeletal: Full ROM all peripheral extremities, joint stability, 5/5 strength, and normal gait. Skin: Warm and dry without rashes, lesions, cyanosis, clubbing or  ecchymosis.  Neuro: Cranial nerves intact, reflexes equal bilaterally. Normal muscle tone, no cerebellar symptoms. Sensation intact.  Pysch: Alert and oriented X 3 with normal affect, insight and judgment appropriate.   Assessment and Plan   1. Essential hypertension  - EKG 12-Lead - Urinalysis, Routine w reflex microscopic - Microalbumin / creatinine urine ratio - CBC with Differential/Platelet - COMPLETE METABOLIC PANEL WITH GFR - Magnesium - TSH  2. Hyperlipidemia, mixed  - Lipid panel - TSH  3. Abnormal glucose  - Hemoglobin A1c - Insulin, random  4. Vitamin D  deficiency  - VITAMIN D 25 Hydroxy   5. Screening for heart disease  - EKG 12-Lead  6. FHx: heart disease  - EKG 12-Lead  7. Moderate episode of recurrent major depressive disorder (HCC)  - TSH  8. Medication management  - EKG 12-Lead - Urinalysis, Routine w reflex microscopic - Microalbumin / creatinine urine ratio - CBC with Differential/Platelet - COMPLETE METABOLIC PANEL WITH GFR - Magnesium - Lipid panel - TSH - Hemoglobin A1c - Insulin, random - VITAMIN D 25 Hydroxy         Patient was counseled in prudent diet, weight control to achieve/maintain BMI less than 25, BP monitoring, regular exercise and medications as discussed.  Discussed med effects and SE's. Routine screening labs and tests as requested with regular follow-up as recommended. Over 40 minutes of exam, counseling, chart review and high complex critical decision making was performed   Kirtland Bouchard, MD

## 2022-06-09 LAB — CBC WITH DIFFERENTIAL/PLATELET
Absolute Monocytes: 749 cells/uL (ref 200–950)
Basophils Absolute: 38 cells/uL (ref 0–200)
Basophils Relative: 0.4 %
Eosinophils Absolute: 298 cells/uL (ref 15–500)
Eosinophils Relative: 3.1 %
HCT: 42.1 % (ref 35.0–45.0)
Hemoglobin: 14.1 g/dL (ref 11.7–15.5)
Lymphs Abs: 2822 cells/uL (ref 850–3900)
MCH: 30.5 pg (ref 27.0–33.0)
MCHC: 33.5 g/dL (ref 32.0–36.0)
MCV: 90.9 fL (ref 80.0–100.0)
MPV: 10.2 fL (ref 7.5–12.5)
Monocytes Relative: 7.8 %
Neutro Abs: 5693 cells/uL (ref 1500–7800)
Neutrophils Relative %: 59.3 %
Platelets: 332 10*3/uL (ref 140–400)
RBC: 4.63 10*6/uL (ref 3.80–5.10)
RDW: 12.9 % (ref 11.0–15.0)
Total Lymphocyte: 29.4 %
WBC: 9.6 10*3/uL (ref 3.8–10.8)

## 2022-06-09 LAB — COMPLETE METABOLIC PANEL WITH GFR
AG Ratio: 1.9 (calc) (ref 1.0–2.5)
ALT: 15 U/L (ref 6–29)
AST: 20 U/L (ref 10–35)
Albumin: 4.6 g/dL (ref 3.6–5.1)
Alkaline phosphatase (APISO): 65 U/L (ref 37–153)
BUN/Creatinine Ratio: 13 (calc) (ref 6–22)
BUN: 14 mg/dL (ref 7–25)
CO2: 24 mmol/L (ref 20–32)
Calcium: 9.6 mg/dL (ref 8.6–10.4)
Chloride: 109 mmol/L (ref 98–110)
Creat: 1.07 mg/dL — ABNORMAL HIGH (ref 0.60–1.00)
Globulin: 2.4 g/dL (calc) (ref 1.9–3.7)
Glucose, Bld: 78 mg/dL (ref 65–99)
Potassium: 4.1 mmol/L (ref 3.5–5.3)
Sodium: 147 mmol/L — ABNORMAL HIGH (ref 135–146)
Total Bilirubin: 0.3 mg/dL (ref 0.2–1.2)
Total Protein: 7 g/dL (ref 6.1–8.1)
eGFR: 54 mL/min/{1.73_m2} — ABNORMAL LOW (ref >=60)

## 2022-06-09 LAB — URINALYSIS, ROUTINE W REFLEX MICROSCOPIC
Bilirubin Urine: NEGATIVE
Glucose, UA: NEGATIVE
Hgb urine dipstick: NEGATIVE
Ketones, ur: NEGATIVE
Leukocytes,Ua: NEGATIVE
Nitrite: NEGATIVE
Protein, ur: NEGATIVE
Specific Gravity, Urine: 1.036 — ABNORMAL HIGH (ref 1.001–1.035)
pH: <=5 (ref 5.0–8.0)

## 2022-06-09 LAB — MICROALBUMIN / CREATININE URINE RATIO
Creatinine, Urine: 229 mg/dL (ref 20–275)
Microalb Creat Ratio: 8 mcg/mg creat (ref <30)
Microalb, Ur: 1.8 mg/dL

## 2022-06-09 LAB — LIPID PANEL
Cholesterol: 127 mg/dL (ref <200)
HDL: 53 mg/dL (ref >=50)
LDL Cholesterol (Calc): 57 mg/dL (calc)
Non-HDL Cholesterol (Calc): 74 mg/dL (calc) (ref <130)
Total CHOL/HDL Ratio: 2.4 (calc) (ref <5.0)
Triglycerides: 89 mg/dL (ref <150)

## 2022-06-09 LAB — HEMOGLOBIN A1C
Hgb A1c MFr Bld: 5.8 % of total Hgb — ABNORMAL HIGH (ref <5.7)
Mean Plasma Glucose: 120 mg/dL
eAG (mmol/L): 6.6 mmol/L

## 2022-06-09 LAB — MAGNESIUM: Magnesium: 2.2 mg/dL (ref 1.5–2.5)

## 2022-06-09 LAB — INSULIN, RANDOM: Insulin: 19 u[IU]/mL — ABNORMAL HIGH

## 2022-06-09 LAB — TSH: TSH: 2.22 mIU/L (ref 0.40–4.50)

## 2022-06-09 LAB — VITAMIN D 25 HYDROXY (VIT D DEFICIENCY, FRACTURES): Vit D, 25-Hydroxy: 48 ng/mL (ref 30–100)

## 2022-06-09 NOTE — Progress Notes (Signed)
<><><><><><><><><><><><><><><><><><><><><><><><><><><><><><><><><> <><><><><><><><><><><><><><><><><><><><><><><><><><><><><><><><><> - Test results slightly outside the reference range are not unusual. If there is anything important, I will review this with you,  otherwise it is considered normal test values.  If you have further questions,  please do not hesitate to contact me at the office or via My Chart.  <><><><><><><><><><><><><><><><><><><><><><><><><><><><><><><><><> <><><><><><><><><><><><><><><><><><><><><><><><><><><><><><><><><>  -  U/A = OK  - No Infection  <><><><><><><><><><><><><><><><><><><><><><><><><><><><><><><><><>  - Kidney functions - still Stage 3a  & stable  <><><><><><><><><><><><><><><><><><><><><><><><><><><><><><><><><>  - A1c  ( 12 week average glucose ) = 5.8% - is  elevated in the borderline and                                                                                                early or pre-diabetes range    which has the same 300% increased risk for   heart attack, stroke, cancer and alzheimer- type vascular dementia                                                                                                            as full blown diabetes.   But the good news is that diet, exercise with weight loss can                                                                                       cure the early diabetes at this point.  -   It is very important that you work harder with diet by                                        avoiding all foods that are white except chicken, fish & calliflower.  - Avoid white rice  (brown & wild rice is OK),   - Avoid white potatoes  (sweet potatoes in moderation is OK),   White bread or wheat bread or anything made out of                                                     white flour like bagels, donuts, rolls,  buns, biscuits, cakes,  - pastries, cookies, pizza crust, and pasta (made from white  flour & egg whites)   - vegetarian pasta or spinach or wheat pasta is OK.  - Multi-grain breads like Arnold's, Pepperidge Farm or                                                               Multi-grain sandwich thins or high fiber breads like   Eureka bread or "Dave's Killer" breads that are   4 to 5 grams fiber per slice !  are best.    Diet, exercise and weight loss can reverse and cure diabetes in the early stages.   <><><><><><><><><><><><><><><><><><><><><><><><><><><><><><><><><> <><><><><><><><><><><><><><><><><><><><><><><><><><><><><><><><><>  - Chol = 127    & LDL Chol = 57  - Both  Excellent   - Very low risk for Heart Attack  / Stroke <><><><><><><><><><><><><><><><><><><><><><><><><><><><><><><><><> <><><><><><><><><><><><><><><><><><><><><><><><><><><><><><><><><>  - Vit D = 48 - is a little low  - are you taking 10,000 units / day   ?  - Vitamin D goal is between 70-100.   - Please make sure that you are taking your Vitamin D as directed.   - It is very important as a natural anti-inflammatory and helping the  immune system protect against viral infections, like the Covid-19    helping hair, skin, and nails, as well as reducing stroke and  heart attack risk.   - It helps your bones and helps with mood.  - It also decreases numerous cancer risks so please  take it as directed.   - Low Vit D is associated with a 200-300% higher risk for  CANCER   and 200-300% higher risk for HEART   ATTACK  &  STROKE.    - It is also associated with higher death rate at younger ages,   autoimmune diseases like Rheumatoid arthritis, Lupus,  Multiple Sclerosis.     - Also many other serious conditions, like depression, Alzheimer's  Dementia, infertility, muscle aches, fatigue, fibromyalgia  <><><><><><><><><><><><><><><><><><><><><><><><><><><><><><><><><> <><><><><><><><><><><><><><><><><><><><><><><><><><><><><><><><><>  - All Else - CBC - Kidneys - Electrolytes -  Liver - Magnesium & Thyroid    - all  Normal / OK <><><><><><><><><><><><><><><><><><><><><><><><><><><><><><><><><> <><><><><><><><><><><><><><><><><><><><><><><><><><><><><><><><><>                   ]

## 2022-06-14 DIAGNOSIS — Z01411 Encounter for gynecological examination (general) (routine) with abnormal findings: Secondary | ICD-10-CM | POA: Diagnosis not present

## 2022-06-14 DIAGNOSIS — Z124 Encounter for screening for malignant neoplasm of cervix: Secondary | ICD-10-CM | POA: Diagnosis not present

## 2022-06-14 DIAGNOSIS — Z6828 Body mass index (BMI) 28.0-28.9, adult: Secondary | ICD-10-CM | POA: Diagnosis not present

## 2022-06-14 DIAGNOSIS — Z1231 Encounter for screening mammogram for malignant neoplasm of breast: Secondary | ICD-10-CM | POA: Diagnosis not present

## 2022-06-14 DIAGNOSIS — Z01419 Encounter for gynecological examination (general) (routine) without abnormal findings: Secondary | ICD-10-CM | POA: Diagnosis not present

## 2022-06-14 LAB — HM MAMMOGRAPHY

## 2022-06-15 ENCOUNTER — Ambulatory Visit: Payer: Self-pay

## 2022-06-15 ENCOUNTER — Ambulatory Visit (INDEPENDENT_AMBULATORY_CARE_PROVIDER_SITE_OTHER): Payer: Medicare Other | Admitting: Surgical

## 2022-06-15 ENCOUNTER — Encounter: Payer: Self-pay | Admitting: Orthopedic Surgery

## 2022-06-15 DIAGNOSIS — M7501 Adhesive capsulitis of right shoulder: Secondary | ICD-10-CM | POA: Diagnosis not present

## 2022-06-15 MED ORDER — METHYLPREDNISOLONE ACETATE 40 MG/ML IJ SUSP
40.0000 mg | INTRAMUSCULAR | Status: AC | PRN
  Administered 2022-06-15: 40 mg via INTRA_ARTICULAR

## 2022-06-15 MED ORDER — BUPIVACAINE HCL 0.5 % IJ SOLN
9.0000 mL | INTRAMUSCULAR | Status: AC | PRN
  Administered 2022-06-15: 9 mL via INTRA_ARTICULAR

## 2022-06-15 MED ORDER — LIDOCAINE HCL 1 % IJ SOLN
5.0000 mL | INTRAMUSCULAR | Status: AC | PRN
  Administered 2022-06-15: 5 mL

## 2022-06-15 NOTE — Progress Notes (Signed)
Follow-up Office Visit Note   Patient: Rebekah Hamilton           Date of Birth: 02-Jun-1946           MRN: [id number] Visit Date: 06/15/2022 Requested by: Unk Pinto, [place] [place] [address] [place],  [place] [address] PCP: Unk Pinto, MD  Subjective: Chief Complaint  Patient presents with   Neck - Pain    HPI: Rebekah Hamilton is a 76 y.o. female who returns to the office for follow-up visit.    Plan at last visit was: Impression is right humerus pain with not too much pain radiating to the shoulder.  She does have a component of biceps pain as well which could be related to her moderate biceps tendinosis.  No evidence of adhesive capsulitis at this time on exam.  I think she may be heading for shoulder surgery; however, I would like to check her neck because of the slightly atypical pattern of presentation of the shoulder pain.  I think is possible she could have a radicular component to her symptoms as well.  Plan at this time is MRI cervical spine to evaluate right-sided radiculopathy.  2 views of the cervical spine do show moderate to severe degenerative disc disease with some loss of lordosis.  Follow-up after that study.  Decision at that time will be for or against cervical spine injection if right-sided pathology is present versus consideration of intra-articular glenohumeral joint injection to test in a diagnostic and therapeutic way the presence of symptomatic biceps tendinitis.  Patient reports that she has had a little over 6 weeks of pain in the right arm that she localizes to the anterior aspect of the mid humeral region near the bicep muscle.  She has had atraumatic onset of pain that has progressed to causing her to wake up with pain.  She can lift her arm to about 60 degrees of abduction without much pain but lifting overhead and putting her arm behind her has caused increased pain for her.  She does not have any pain at rest.  She describes a  throbbing sensation.  She has seen her PCP who prescribed 2 extra strength Tylenol to take every 4 hours while she is awake.  No numbness or tingling.  Occasional scapular pain.  Pain does not really radiate down the arm at all.  She has some migraines that she has noticed as well recently.  She has history of 2 prior spinal fusions involving the majority of her spine that were done by Dr. Harl Bowie at [facility]  After the second surgery she had weakness and difficulty walking that required physical therapy over the last year.  She states that the current arm pain she has is similar to the nerve pain that she had prior to her back surgery.  She was seen by Dr. Marlou Sa on 05/26/2022 who ordered MRI cervical spine due to her concern over referred pain from the neck as she felt this was nerve related.              ROS: All systems reviewed are negative as they relate to the chief complaint within the history of present illness.  Patient denies fevers or chills.  Assessment & Plan: Visit Diagnoses:  1. Adhesive capsulitis of right shoulder     Plan: Rebekah Hamilton is a 76 y.o. female who returns to the office for follow-up visit for right shoulder pain.  Plan from  last visit was noted above in HPI.  They now return with continued right shoulder and arm pain that is mostly localized to the mid humerus region.  She is here today to review MRI of the cervical spine that was ordered at her last appointment.  MRI does demonstrate multilevel findings primarily of moderate to severe spinal stenosis at C4-C5 through C6-C7 with a right sided paracentral disc protrusion at C6-C7.  She also has moderate to severe foraminal narrowing throughout multiple levels on the right side.  However, most of her symptoms seem related to shoulder range of motion on exam today and she does have reduced passive motion of the right shoulder compared with the left that is noted in all planes compared with the contralateral  extremity.    Impression today is likely frozen shoulder.  Discussed the options available to patient.  She is doing a home exercise program focusing on shoulder range of motion exercises and she feels like this has been helpful for her.  She would like to continue with this and try an ultrasound-guided right shoulder glenohumeral joint injection today.  This will be diagnostic and therapeutic hopefully for her.  Under ultrasound guidance, right glenohumeral injection was successfully delivered and she tolerated the procedure well.  About 2 to 3 minutes after the injection she did note significant improvement in her symptoms and she had markedly less pain with terminal external rotation, abduction, internal rotation.  She will give this a couple weeks to take effect and if in 2 weeks she has no relief of symptoms, she will call the office and we can set her up for cervical spine ESI with Dr. Ernestina Patches.  Otherwise she will follow-up in 6 weeks for clinical recheck with Dr. Marlou Sa.  Follow-Up Instructions: No follow-ups on file.   Orders:  Orders Placed This Encounter  Procedures   US Guided Needle Placement - No Linked Charges   No orders of the defined types were placed in this encounter.     Procedures: Large Joint Inj: R glenohumeral on 06/15/2022 8:16 PM Indications: diagnostic evaluation and pain Details: 22 G 3.5 in and 1.5 in needle, ultrasound-guided posterior approach  Arthrogram: No  Medications: 9 mL bupivacaine 0.5 %; 40 mg methylPREDNISolone acetate 40 MG/ML; 5 mL lidocaine 1 % Outcome: tolerated well, no immediate complications Procedure, treatment alternatives, risks and benefits explained, specific risks discussed. Consent was given by the patient. Immediately prior to procedure a time out was called to verify the correct patient, procedure, equipment, support staff and site/side marked as required. Patient was prepped and draped in the usual sterile fashion.       Clinical  Data: No additional findings.  Objective: Vital Signs: There were no vitals taken for this visit.  Physical Exam:  Constitutional: Patient appears well-developed HEENT:  Head: Normocephalic Eyes:EOM are normal Neck: Normal range of motion Cardiovascular: Normal rate Pulmonary/chest: Effort normal Neurologic: Patient is alert Skin: Skin is warm Psychiatric: Patient has normal mood and affect  Ortho Exam: Ortho exam demonstrates right shoulder with 40 degrees external rotation, 70 degrees abduction, 120 degrees forward flexion passively.  This compared to the left shoulder with 55 degrees external rotation, 90 degrees abduction, 160 degrees forward flexion passively.  She has excellent rotator cuff strength of supraspinatus, infraspinatus, subscapularis.  Axillary nerve is intact with deltoid firing bilaterally.  Excellent strength rated 5/5 of grip strength, finger abduction, pronation/supination, bicep, tricep, deltoid.  No reproduction of shoulder pain with supination and bicep flexion and resisted  strength testing.  She has minimal tenderness over the bicep tendon.  No tenderness over the [facility] joint.  No reproduction with cervical spine range of motion.  She has pain with primarily passive range of motion at the terminal range of motion of her rotation, abduction, forward flexion, internal rotation.  She does have internal rotation of the left shoulder with her thumb reaching to the inferior angle of the scapula.  The right shoulder internal rotation only reaches to the upper lumbar spine.  Specialty Comments:  No specialty comments available.  Imaging: No results found.   PMFS History: Patient Active Problem List   Diagnosis Date Noted   S/P lumbar fusion 11/12/2020   Chronic venous insufficiency 05/07/2019   History of 2019 novel coronavirus disease (COVID-19) 05/07/2019   Chronic left-sided low back pain with left-sided sciatica 07/02/2018   OAB (overactive bladder) 04/02/2018    CKD (chronic kidney disease), symptom management only, stage 2 (mild) 09/24/2017   Encounter for Medicare annual wellness exam 06/02/2015   Glaucoma 06/02/2015   Vitamin D deficiency 10/21/2014   Medication management 12/01/2013   Hyperlipidemia, mixed    Essential hypertension    Abnormal glucose    Migraines    Allergy    Anxiety    Depression, major, recurrent (HCC)    Lumbar stenosis    Past Medical History:  Diagnosis Date   Allergy    Anemia    Anxiety    Depression    Hyperlipidemia    Hypertension    Lumbar stenosis    Migraines    Prediabetes     Family History  Problem Relation Age of Onset   Stroke Mother    Hypertension Mother    Heart attack Father 61   Other Maternal Grandmother        blood clot after surgery   Heart attack Paternal Grandmother     Past Surgical History:  Procedure Laterality Date   BREAST LUMPECTOMY Bilateral    CHOLECYSTECTOMY  1999   SPINE SURGERY     Social History   Occupational History   Occupation: Optometrist  Tobacco Use   Smoking status: Never   Smokeless tobacco: Never  Vaping Use   Vaping Use: Never used  Substance and Sexual Activity   Alcohol use: Yes    Alcohol/week: 2.0 standard drinks of alcohol    Types: 2 Glasses of wine per week   Drug use: No   Sexual activity: Not Currently

## 2022-07-27 ENCOUNTER — Ambulatory Visit (INDEPENDENT_AMBULATORY_CARE_PROVIDER_SITE_OTHER): Payer: Medicare Other | Admitting: Orthopedic Surgery

## 2022-07-27 DIAGNOSIS — M5412 Radiculopathy, cervical region: Secondary | ICD-10-CM

## 2022-07-30 ENCOUNTER — Encounter: Payer: Self-pay | Admitting: Orthopedic Surgery

## 2022-07-30 NOTE — Progress Notes (Signed)
Office Visit Note   Patient: Rebekah Hamilton           Date of Birth: 19-Dec-1945           MRN: [id number] Visit Date: 07/27/2022 Requested by: Unk Pinto, [place] [address] [place],  [place] [address] PCP: Unk Pinto, MD  Subjective: Chief Complaint  Patient presents with   Right Shoulder - Follow-up    HPI: Rebekah Hamilton is a 76 y.o. female who presents to the office reporting right shoulder pain.  She had right glenohumeral injection 06/15/2022.  Did get some relief for about 2 to 3 days but the pain returned as severe as it was before the injection.  Pain wakes her from sleep at night she is right-hand dominant.  Reports difficulty with ADLs and it is hard for her to reach behind her back.  Localizes pain to the anterior humeral region.  Hurts her to get dressed.  Abduction also hurts.  She does take Tylenol on a regular basis.                ROS: All systems reviewed are negative as they relate to the chief complaint within the history of present illness.  Patient denies fevers or chills.  Assessment & Plan: Visit Diagnoses:  1. Radiculopathy, cervical region     Plan: Impression is right humerus pain.  This is really more in the humerus region and not necessarily the shoulder.  She does have moderate biceps tendinosis.  She does have some neck arthritis on radiographs.  MRI scan does show moderate to severe spinal stenosis at C4-5 through C6-7 with a superimposed right paracentral disc protrusion at C6-7 which could contribute to right-sided radicular symptoms.  She also has some moderate right C4 foraminal stenosis and severe bilateral C5 and C6 foraminal narrowing.  Plan is referral to Dr. Ernestina Patches for [facility] and evaluation of right-sided radiculopathy.  6-week return.  May need to consider surgical consultation with Dr. Laurance Flatten at that time  Follow-Up Instructions: No follow-ups on file.   Orders:  Orders Placed This Encounter   Procedures   Ambulatory referral to Physical Medicine Rehab   No orders of the defined types were placed in this encounter.     Procedures: No procedures performed   Clinical Data: No additional findings.  Objective: Vital Signs: There were no vitals taken for this visit.  Physical Exam:  Constitutional: Patient appears well-developed HEENT:  Head: Normocephalic Eyes:EOM are normal Neck: Normal range of motion Cardiovascular: Normal rate Pulmonary/chest: Effort normal Neurologic: Patient is alert Skin: Skin is warm Psychiatric: Patient has normal mood and affect  Ortho Exam: Ortho exam demonstrates full active and passive range of motion of the elbows and wrist.  Shoulder range of motion on the right 70/85/155.  Shoulder range of motion on the left 70/100/175.  Prereasonable rotator cuff strength demonstrates x-rays subscap muscle testing.  No definite paresthesias C5-T1.  Radial pulse intact bilaterally.  Neck range of motion slightly limited and painful with rotation to the right.  Specialty Comments:  MRI CERVICAL SPINE WITHOUT CONTRAST   TECHNIQUE: Multiplanar, multisequence MR imaging of the cervical spine was performed. No intravenous contrast was administered.   COMPARISON:  Prior radiograph from 05/26/2022.   FINDINGS: Alignment: Straightening with mild reversal of the normal cervical lordosis. Trace stepwise degenerative retrolisthesis of C4 on C5, C5 on C6, and C6 on C7, with trace anterolisthesis of C7 on T1, T1 on T2, and  T2 on T3. Findings likely chronic and facet mediated.   Vertebrae: Vertebral body height maintained without acute or chronic fracture. Bone marrow signal intensity within normal limits. 1 cm stir hyperintense lesion within the C3 vertebral body likely reflects an atypical hemangioma. No other discrete or worrisome osseous lesions. Mild discogenic reactive endplate changes present at C4-5 through C6-7. No other abnormal marrow edema.    Cord: Normal signal and morphology.   Posterior Fossa, vertebral arteries, paraspinal tissues: Chronic microvascular ischemic changes noted within the pons. Visualized brain and posterior fossa otherwise unremarkable. Craniocervical junction normal. Paraspinous soft tissues within normal limits. Normal flow voids seen within the vertebral arteries bilaterally. 1 cm right thyroid nodule noted, doubtful significance given size and patient age, no follow-up imaging recommended (ref: J Am Coll Radiol. 2015 Feb;12(2): 143-50).   Disc levels:   C2-C3: Left-sided uncovertebral spurring without significant disc bulge. Mild left-sided facet hypertrophy. No spinal stenosis. Mild left C3 foraminal narrowing. Right neural foramen remains patent.   C3-C4: Mild right eccentric disc bulge with bilateral uncovertebral spurring. Moderate right with mild left facet hypertrophy. No spinal stenosis. Moderate right with mild left C4 foraminal stenosis.   C4-C5: Degenerative intervertebral disc space narrowing with diffuse disc osteophyte complex. Broad posterior component effaces the ventral thecal sac. Superimposed mild facet and ligament flavum hypertrophy. Resultant moderate spinal stenosis with minimal cord flattening, but no cord signal changes. Severe bilateral C5 foraminal stenosis.   C5-C6: Degenerative vertebral disc space narrowing with diffuse disc osteophyte complex. Broad posterior component flattens and partially effaces the ventral thecal sac, slightly asymmetric to the right. Secondary cord flattening without cord signal changes. Moderate to severe spinal stenosis. Severe bilateral C6 foraminal narrowing.   C6-C7: Degenerative intervertebral disc space narrowing with diffuse disc osteophyte complex. Superimposed right paracentral disc protrusion indents the right ventral thecal sac (series 8, image 20). Secondary cord flattening without cord signal changes. Moderate spinal  stenosis. Severe left with mild right C7 foraminal stenosis.   C7-T1: Mild disc bulge with uncovertebral spurring. Moderate left with mild right facet hypertrophy. No spinal stenosis. Foramina remain patent.   IMPRESSION: 1. Multilevel cervical spondylosis with resultant moderate to severe spinal stenosis at C4-5 through C6-7. Particular note made of a superimposed right paracentral disc protrusion at C6-7, which could contribute to right-sided radicular symptoms. 2. Multifactorial degenerative changes with resultant multilevel foraminal narrowing as above. Notable findings include moderate right C4 foraminal stenosis, severe bilateral C5 and C6 foraminal narrowing, with severe left C7 foraminal stenosis.     Electronically Signed   By: Jeannine Boga M.D.   On: 06/05/2022 05:13  Imaging: No results found.   PMFS History: Patient Active Problem List   Diagnosis Date Noted   S/P lumbar fusion 11/12/2020   Chronic venous insufficiency 05/07/2019   History of 2019 novel coronavirus disease (COVID-19) 05/07/2019   Chronic left-sided low back pain with left-sided sciatica 07/02/2018   OAB (overactive bladder) 04/02/2018   CKD (chronic kidney disease), symptom management only, stage 2 (mild) 09/24/2017   Encounter for Medicare annual wellness exam 06/02/2015   Glaucoma 06/02/2015   Vitamin D deficiency 10/21/2014   Medication management 12/01/2013   Hyperlipidemia, mixed    Essential hypertension    Abnormal glucose    Migraines    Allergy    Anxiety    Depression, major, recurrent (HCC)    Lumbar stenosis    Past Medical History:  Diagnosis Date   Allergy    Anemia    Anxiety  Depression    Hyperlipidemia    Hypertension    Lumbar stenosis    Migraines    Prediabetes     Family History  Problem Relation Age of Onset   Stroke Mother    Hypertension Mother    Heart attack Father 37   Other Maternal Grandmother        blood clot after surgery   Heart  attack Paternal Grandmother     Past Surgical History:  Procedure Laterality Date   BREAST LUMPECTOMY Bilateral    CHOLECYSTECTOMY  1999   SPINE SURGERY     Social History   Occupational History   Occupation: Optometrist  Tobacco Use   Smoking status: Never   Smokeless tobacco: Never  Vaping Use   Vaping Use: Never used  Substance and Sexual Activity   Alcohol use: Yes    Alcohol/week: 2.0 standard drinks of alcohol    Types: 2 Glasses of wine per week   Drug use: No   Sexual activity: Not Currently

## 2022-08-03 ENCOUNTER — Telehealth: Payer: Self-pay | Admitting: Physical Medicine and Rehabilitation

## 2022-08-03 NOTE — Telephone Encounter (Signed)
See previous encounter

## 2022-08-03 NOTE — Telephone Encounter (Signed)
Pt returned call to [place] to set appt. Please call pt at [phone]

## 2022-08-03 NOTE — Telephone Encounter (Signed)
Spoke with patient and was unable to give her an appointment for . She wanted referral sent to [facility]. Sent request to Geryl Rankins, [place]

## 2022-08-03 NOTE — Telephone Encounter (Signed)
LVM #1 to return call to schedule injection 

## 2022-08-03 NOTE — Telephone Encounter (Signed)
Pt seen Dr Marlou Sa and he stated he would send a referral for pt to have injections in her neck. Please call pt about this matter at [phone]

## 2022-08-03 NOTE — Telephone Encounter (Signed)
Patient returned call asked for a call back to schedule an appointment with Dr. Ernestina Patches   The number to contact patient is [phone]

## 2022-08-04 ENCOUNTER — Other Ambulatory Visit: Payer: Self-pay | Admitting: Physical Medicine and Rehabilitation

## 2022-08-04 DIAGNOSIS — M5010 Cervical disc disorder with radiculopathy, unspecified cervical region: Secondary | ICD-10-CM

## 2022-08-05 ENCOUNTER — Telehealth: Payer: Self-pay | Admitting: Physical Medicine and Rehabilitation

## 2022-08-05 NOTE — Telephone Encounter (Signed)
Pt called to see if she was approved for injection. Please call pt at [phone]

## 2022-08-12 ENCOUNTER — Ambulatory Visit
Admission: RE | Admit: 2022-08-12 | Discharge: 2022-08-12 | Disposition: A | Discharge status: Home / Self Care | Payer: Medicare Other | Source: Ambulatory Visit | Attending: Physical Medicine and Rehabilitation | Admitting: Physical Medicine and Rehabilitation

## 2022-08-12 DIAGNOSIS — M50122 Cervical disc disorder at C5-C6 level with radiculopathy: Secondary | ICD-10-CM | POA: Diagnosis not present

## 2022-08-12 DIAGNOSIS — M5010 Cervical disc disorder with radiculopathy, unspecified cervical region: Secondary | ICD-10-CM

## 2022-08-12 DIAGNOSIS — M50123 Cervical disc disorder at C6-C7 level with radiculopathy: Secondary | ICD-10-CM | POA: Diagnosis not present

## 2022-08-12 DIAGNOSIS — M50121 Cervical disc disorder at C4-C5 level with radiculopathy: Secondary | ICD-10-CM | POA: Diagnosis not present

## 2022-08-12 MED ORDER — TRIAMCINOLONE ACETONIDE 40 MG/ML IJ SUSP (RADIOLOGY)
60.0000 mg | Freq: Once | INTRAMUSCULAR | Status: AC
  Administered 2022-08-12: 60 mg via EPIDURAL

## 2022-08-12 MED ORDER — IOPAMIDOL (ISOVUE-M 300) INJECTION 61%
1.0000 mL | Freq: Once | INTRAMUSCULAR | Status: AC | PRN
  Administered 2022-08-12: 1 mL via EPIDURAL

## 2022-08-12 NOTE — Discharge Instructions (Signed)
Post Procedure Spinal Discharge Instruction Sheet  You may resume a regular diet and any medications that you routinely take (including pain medications) unless otherwise noted by MD.  No driving day of procedure.  Light activity throughout the rest of the day.  Do not do any strenuous work, exercise, bending or lifting.  The day following the procedure, you can resume normal physical activity but you should refrain from exercising or physical therapy for at least three days thereafter.  You may apply ice to the injection site, 20 minutes on, 20 minutes off, as needed. Do not apply ice directly to skin.    Common Side Effects:  Headaches- take your usual medications as directed by your physician.  Increase your fluid intake.  Caffeinated beverages may be helpful.  Lie flat in bed until your headache resolves.  Restlessness or inability to sleep- you may have trouble sleeping for the next few days.  Ask your referring physician if you need any medication for sleep.  Facial flushing or redness- should subside within a few days.  Increased pain- a temporary increase in pain a day or two following your procedure is not unusual.  Take your pain medication as prescribed by your referring physician.  Leg cramps  Please contact our office at [phone] for the following symptoms: Fever greater than 100 degrees. Headaches unresolved with medication after 2-3 days. Increased swelling, pain, or redness at injection site.    YOU MAY RESUME YOUR ASPIRIN ANYTIME AFTER PROCEDURE TODAY  Thank you for visiting [facility] today.

## 2022-09-07 ENCOUNTER — Ambulatory Visit (INDEPENDENT_AMBULATORY_CARE_PROVIDER_SITE_OTHER): Payer: PPO | Admitting: Orthopedic Surgery

## 2022-09-07 ENCOUNTER — Encounter: Payer: Self-pay | Admitting: Orthopedic Surgery

## 2022-09-07 DIAGNOSIS — M5412 Radiculopathy, cervical region: Secondary | ICD-10-CM | POA: Diagnosis not present

## 2022-09-07 NOTE — Progress Notes (Signed)
Office Visit Note   Patient: Rebekah Hamilton           Date of Birth: 03-22-1946           MRN: [id number] Visit Date: 09/07/2022 Requested by: Unk Pinto, [place] [address] [place] [employer],  [place] [address] PCP: Unk Pinto, MD  Subjective: Chief Complaint  Patient presents with   Right Shoulder - Pain    HPI: Rebekah Hamilton is a 77 y.o. female who presents to the office reporting neck and arm pain.  She is here as follow-up from her cervical spine injection on 08/12/2022.  Pain started to recur about 2 weeks ago.  Describes her current right-sided biceps pain which wakes her from sleep at night.  Takes Tylenol daily.  She is right-hand dominant.  Keeping a pillow under the arm helps.  She states that she can tolerate her symptoms as they are right now.  The biceps pain did disappear early on after the injection.  She does have multilevel cervical disc herniations.  Shoulder MRI scan has some age-appropriate degenerative findings but nothing that is as compelling as her neck MRI scan..                ROS: All systems reviewed are negative as they relate to the chief complaint within the history of present illness.  Patient denies fevers or chills.  Assessment & Plan: Visit Diagnoses: No diagnosis found.  Plan: Impression is right-sided radiculopathy with temporary improvement after cervical spine injection.  Her shoulder symptoms improved significantly after the cervical spine injection but that relief is now fleeting.  Currently she can live with her symptoms.  Plan is for week return with decision for or against at that time referral to Dr. Laurance Flatten.  She will call and we can set up 1 more epidural steroid injection if her symptoms become more severe.  Follow-Up Instructions: No follow-ups on file.   Orders:  No orders of the defined types were placed in this encounter.  No orders of the defined types were placed in this encounter.      Procedures: No procedures performed   Clinical Data: No additional findings.  Objective: Vital Signs: There were no vitals taken for this visit.  Physical Exam:  Constitutional: Patient appears well-developed HEENT:  Head: Normocephalic Eyes:EOM are normal Neck: Normal range of motion Cardiovascular: Normal rate Pulmonary/chest: Effort normal Neurologic: Patient is alert Skin: Skin is warm Psychiatric: Patient has normal mood and affect  Ortho Exam: Ortho exam demonstrates good cervical spine range of motion.  She has good infraspinatus supraspinatus and subscap strength testing bilaterally with no real coarse grinding or crepitus with internal/external rotation of either shoulder at 90 degrees of abduction.  Passive range of motion is maintained in both shoulders with no asymmetry.  No muscle atrophy in the arms reflexes are symmetric bilateral biceps and triceps.  No definite paresthesias C5-T1.  Specialty Comments:  MRI CERVICAL SPINE WITHOUT CONTRAST   TECHNIQUE: Multiplanar, multisequence MR imaging of the cervical spine was performed. No intravenous contrast was administered.   COMPARISON:  Prior radiograph from 05/26/2022.   FINDINGS: Alignment: Straightening with mild reversal of the normal cervical lordosis. Trace stepwise degenerative retrolisthesis of C4 on C5, C5 on C6, and C6 on C7, with trace anterolisthesis of C7 on T1, T1 on T2, and T2 on T3. Findings likely chronic and facet mediated.   Vertebrae: Vertebral body height maintained without acute or chronic fracture. Bone marrow signal  intensity within normal limits. 1 cm stir hyperintense lesion within the C3 vertebral body likely reflects an atypical hemangioma. No other discrete or worrisome osseous lesions. Mild discogenic reactive endplate changes present at C4-5 through C6-7. No other abnormal marrow edema.   Cord: Normal signal and morphology.   Posterior Fossa, vertebral arteries, paraspinal  tissues: Chronic microvascular ischemic changes noted within the pons. Visualized brain and posterior fossa otherwise unremarkable. Craniocervical junction normal. Paraspinous soft tissues within normal limits. Normal flow voids seen within the vertebral arteries bilaterally. 1 cm right thyroid nodule noted, doubtful significance given size and patient age, no follow-up imaging recommended (ref: J Am Coll Radiol. 2015 Feb;12(2): 143-50).   Disc levels:   C2-C3: Left-sided uncovertebral spurring without significant disc bulge. Mild left-sided facet hypertrophy. No spinal stenosis. Mild left C3 foraminal narrowing. Right neural foramen remains patent.   C3-C4: Mild right eccentric disc bulge with bilateral uncovertebral spurring. Moderate right with mild left facet hypertrophy. No spinal stenosis. Moderate right with mild left C4 foraminal stenosis.   C4-C5: Degenerative intervertebral disc space narrowing with diffuse disc osteophyte complex. Broad posterior component effaces the ventral thecal sac. Superimposed mild facet and ligament flavum hypertrophy. Resultant moderate spinal stenosis with minimal cord flattening, but no cord signal changes. Severe bilateral C5 foraminal stenosis.   C5-C6: Degenerative vertebral disc space narrowing with diffuse disc osteophyte complex. Broad posterior component flattens and partially effaces the ventral thecal sac, slightly asymmetric to the right. Secondary cord flattening without cord signal changes. Moderate to severe spinal stenosis. Severe bilateral C6 foraminal narrowing.   C6-C7: Degenerative intervertebral disc space narrowing with diffuse disc osteophyte complex. Superimposed right paracentral disc protrusion indents the right ventral thecal sac (series 8, image 20). Secondary cord flattening without cord signal changes. Moderate spinal stenosis. Severe left with mild right C7 foraminal stenosis.   C7-T1: Mild disc bulge with  uncovertebral spurring. Moderate left with mild right facet hypertrophy. No spinal stenosis. Foramina remain patent.   IMPRESSION: 1. Multilevel cervical spondylosis with resultant moderate to severe spinal stenosis at C4-5 through C6-7. Particular note made of a superimposed right paracentral disc protrusion at C6-7, which could contribute to right-sided radicular symptoms. 2. Multifactorial degenerative changes with resultant multilevel foraminal narrowing as above. Notable findings include moderate right C4 foraminal stenosis, severe bilateral C5 and C6 foraminal narrowing, with severe left C7 foraminal stenosis.     Electronically Signed   By: Jeannine Boga M.D.   On: 06/05/2022 05:13  Imaging: No results found.   PMFS History: Patient Active Problem List   Diagnosis Date Noted   S/P lumbar fusion 11/12/2020   Chronic venous insufficiency 05/07/2019   History of 2019 novel coronavirus disease (COVID-19) 05/07/2019   Chronic left-sided low back pain with left-sided sciatica 07/02/2018   OAB (overactive bladder) 04/02/2018   CKD (chronic kidney disease), symptom management only, stage 2 (mild) 09/24/2017   Encounter for Medicare annual wellness exam 06/02/2015   Glaucoma 06/02/2015   Vitamin D deficiency 10/21/2014   Medication management 12/01/2013   Hyperlipidemia, mixed    Essential hypertension    Abnormal glucose    Migraines    Allergy    Anxiety    Depression, major, recurrent (HCC)    Lumbar stenosis    Past Medical History:  Diagnosis Date   Allergy    Anemia    Anxiety    Depression    Hyperlipidemia    Hypertension    Lumbar stenosis    Migraines  Prediabetes     Family History  Problem Relation Age of Onset   Stroke Mother    Hypertension Mother    Heart attack Father 51   Other Maternal Grandmother        blood clot after surgery   Heart attack Paternal Grandmother     Past Surgical History:  Procedure Laterality Date    BREAST LUMPECTOMY Bilateral    CHOLECYSTECTOMY  1999   SPINE SURGERY     Social History   Occupational History   Occupation: Optometrist  Tobacco Use   Smoking status: Never   Smokeless tobacco: Never  Vaping Use   Vaping Use: Never used  Substance and Sexual Activity   Alcohol use: Yes    Alcohol/week: 2.0 standard drinks of alcohol    Types: 2 Glasses of wine per week   Drug use: No   Sexual activity: Not Currently

## 2022-09-08 ENCOUNTER — Encounter: Payer: Self-pay | Admitting: Internal Medicine

## 2022-09-14 ENCOUNTER — Encounter: Payer: Self-pay | Admitting: Internal Medicine

## 2022-09-14 ENCOUNTER — Ambulatory Visit (INDEPENDENT_AMBULATORY_CARE_PROVIDER_SITE_OTHER): Payer: HMO | Admitting: Internal Medicine

## 2022-09-14 VITALS — BP 158/82 | HR 75 | Temp 98.1°F | Resp 16 | Ht 63.5 in | Wt 158.8 lb

## 2022-09-14 DIAGNOSIS — E559 Vitamin D deficiency, unspecified: Secondary | ICD-10-CM | POA: Diagnosis not present

## 2022-09-14 DIAGNOSIS — E782 Mixed hyperlipidemia: Secondary | ICD-10-CM | POA: Diagnosis not present

## 2022-09-14 DIAGNOSIS — Z79899 Other long term (current) drug therapy: Secondary | ICD-10-CM | POA: Diagnosis not present

## 2022-09-14 DIAGNOSIS — R7309 Other abnormal glucose: Secondary | ICD-10-CM | POA: Diagnosis not present

## 2022-09-14 DIAGNOSIS — I10 Essential (primary) hypertension: Secondary | ICD-10-CM | POA: Diagnosis not present

## 2022-09-14 NOTE — Patient Instructions (Signed)

## 2022-09-14 NOTE — Progress Notes (Signed)
Future Appointments  Date Time Provider Department  09/14/2022  2:30 PM Unk Pinto, MD [facility]  10/07/2022 10:15 AM Marlou Sa Tonna Corner, MD [facility]  12/14/2022                    wellness  2:30 PM Alycia Rossetti, NP [facility]  06/14/2023                cpe  2:00 PM Unk Pinto, MD [facility]    History of Present Illness:      This very nice 77 y.o. DWF  presents for 3 month follow up with HTN, HLD, Pre-Diabetes and Vitamin D Deficiency.         Patient is treated for HTN  (2018)  & BP has been controlled at home. Today's BP is elevated not at goal -  158/82. Patient has had no complaints of any cardiac type chest pain, palpitations, dyspnea / orthopnea / PND, dizziness, claudication, or dependent edema.        Hyperlipidemia is controlled with diet & meds. Patient denies myalgias or other med SE's. Last Lipids were at goal :  Lab Results  Component Value Date   CHOL 127 06/08/2022   HDL 53 06/08/2022   LDLCALC 57 06/08/2022   TRIG 89 06/08/2022   CHOLHDL 2.4 06/08/2022     Also, the patient has history of PreDiabetes and has had no symptoms of reactive hypoglycemia, diabetic polys, paresthesias or visual blurring.  Last A1c was near goal :  Lab Results  Component Value Date   HGBA1C 5.8 (H) 06/08/2022                                                        Further, the patient also has history of Vitamin D Deficiency and supplements vitamin D . Last vitamin D was sl low  (goal  70-100) :   Lab Results  Component Value Date   VD25OH 48 06/08/2022     Current Outpatient Medications on File Prior to Visit  Medication Sig   ALPRAZolam (XANAX) 0.25 MG tablet Take 1/2 to 1 tablet 2 to 3 x / day ONLY if needed   Ascorbic Acid (VITAMIN C PO) 500 mg 2  times daily.   EXCEDRIN EXTRA STRENGTH Take 1 tablet daily.   buPROPion XL 150 MG 24 hr tablet Take 1 tablet every Morning    Cholecalciferol (VITAMIN D PO) Takes 10000 to 12000 units daily.   DULoxetine  (CYMBALTA) 30 MG capsule Take  3 capsules (90 mg)  Daily  f   estradiol (ESTRACE) 0.5 MG tablet Take  1 tablet  Daily  for Estrogen Deficiency   fexofenadine (ALLEGRA) 180 MG tablet Take  daily.   hydrochlorothiazide  25 MG tablet Take 1/2 -1 tablet daily    pravastatin 40 MG tablet Take  1 tablet at Bedtime   progesterone (PROMETRIUM) 100 MG capsule Take  1 capsule  Daily   rizatriptan (MAXALT) 10 MG tablet Take  1 tablet  for Migraine & may repeat 1 x in 2 hrs      Allergies  Allergen Reactions   Augmentin [Amoxicillin-Pot Clavulanate] Diarrhea   Doxycycline Nausea Only   Egg White (Diagnostic) Nausea Only   Prozac [Fluoxetine Hcl] Other (See Comments)    Disoriented  PMHx:   Past Medical History:  Diagnosis Date   Allergy    Anemia    Anxiety    Depression    Hyperlipidemia    Hypertension    Lumbar stenosis    Migraines    Prediabetes      Immunization History  Administered Date(s) Administered   Influenza, High Dose  05/29/2014, 06/02/2015, 06/16/2017, 05/09/2018, 05/09/2019   Influenza,inj,quad 08/12/2013, 07/04/2016   Influenza-Unspecified 05/25/2020   PFIZER-SARS-COV-2 Vacc 09/30/2019, 10/21/2019, 06/22/2020, 02/06/2021   PNEUMOCOCCAL -20 02/06/2021   Pneumococcal -13 04/07/2015   Pneumococcal -23 05/29/2013   Td 08/15/2005, 07/04/2016     Past Surgical History:  Procedure Laterality Date   BREAST LUMPECTOMY Bilateral    CHOLECYSTECTOMY  1999   SPINE SURGERY       FHx:    Reviewed / unchanged   SHx:    Reviewed / unchanged    Systems Review:  Constitutional: Denies fever, chills, wt changes, headaches, insomnia, fatigue, night sweats, change in appetite. Eyes: Denies redness, blurred vision, diplopia, discharge, itchy, watery eyes.  ENT: Denies discharge, congestion, post nasal drip, epistaxis, sore throat, earache, hearing loss, dental pain, tinnitus, vertigo, sinus pain, snoring.  CV: Denies chest pain, palpitations, irregular heartbeat,  syncope, dyspnea, diaphoresis, orthopnea, PND, claudication or edema. Respiratory: denies cough, dyspnea, DOE, pleurisy, hoarseness, laryngitis, wheezing.  Gastrointestinal: Denies dysphagia, odynophagia, heartburn, reflux, water brash, abdominal pain or cramps, nausea, vomiting, bloating, diarrhea, constipation, hematemesis, melena, hematochezia  or hemorrhoids. Genitourinary: Denies dysuria, frequency, urgency, nocturia, hesitancy, discharge, hematuria or flank pain. Musculoskeletal: Denies arthralgias, myalgias, stiffness, jt. swelling, pain, limping or strain/sprain.  Skin: Denies pruritus, rash, hives, warts, acne, eczema or change in skin lesion(s). Neuro: No weakness, tremor, incoordination, spasms, paresthesia or pain. Psychiatric: Denies confusion, memory loss or sensory loss. Endo: Denies change in weight, skin or hair change.  Heme/Lymph: No excessive bleeding, bruising or enlarged lymph nodes.   Physical Exam  BP (!) 158/82   Pulse 75   Temp 98.1 F (36.7 C)   Resp 16   Ht 5' 3.5" (1.613 m)   Wt 158 lb 12.8 oz (72 kg)   SpO2 99%   BMI 27.69 kg/m   Appears  well nourished, well groomed  and in no distress.  Eyes: PERRLA, EOMs, conjunctiva no swelling or erythema. Sinuses: No frontal/maxillary tenderness ENT/Mouth: EAC's clear, TM's nl w/o erythema, bulging. Nares clear w/o erythema, swelling, exudates. Oropharynx clear without erythema or exudates. Oral hygiene is good. Tongue normal, non obstructing. Hearing intact.  Neck: Supple. Thyroid not palpable. Car 2+/2+ without bruits, nodes or JVD. Chest: Respirations nl with BS clear & equal w/o rales, rhonchi, wheezing or stridor.  Cor: Heart sounds normal w/ regular rate and rhythm without sig. murmurs, gallops, clicks or rubs. Peripheral pulses normal and equal  without edema.  Abdomen: Soft & bowel sounds normal. Non-tender w/o guarding, rebound, hernias, masses or organomegaly.  Lymphatics: Unremarkable.   Musculoskeletal: Full ROM all peripheral extremities, joint stability, 5/5 strength and normal gait.  Skin: Warm, dry without exposed rashes, lesions or ecchymosis apparent.  Neuro: Cranial nerves intact, reflexes equal bilaterally. Sensory-motor testing grossly intact. Tendon reflexes grossly intact.  Pysch: Alert & oriented x 3.  Insight and judgement nl & appropriate. No ideations.   Assessment and Plan:  1. Essential hypertension  - Continue medication, monitor blood pressure at home & Call if remains elevated over 140/90 - Continue DASH diet.  Reminder to go to the ER if any CP,  SOB, nausea,  dizziness, severe HA, changes vision/speech.   - CBC with Differential/Platelet - COMPLETE METABOLIC PANEL WITH GFR - Magnesium - TSH  2. Hyperlipidemia, mixed  - Continue diet/meds, exercise,& lifestyle modifications.  - Continue monitor periodic cholesterol/liver & renal functions   - Lipid panel - TSH  3. Abnormal glucose  - Continue diet, exercise  - Lifestyle modifications.  - Monitor appropriate labs   Hemoglobin A1c - Insulin, random  4. Vitamin D deficiency  - Continue supplementation   - VITAMIN D 25 Hydroxy   5. Medication management  - CBC with Differential/Platelet - COMPLETE METABOLIC PANEL WITH GFR - Magnesium - Lipid panel - TSH - Hemoglobin A1c - Insulin, random - VITAMIN D 25 Hydroxy           Discussed  regular exercise, BP monitoring, weight control to achieve/maintain BMI less than 25 and discussed med and SE's. Recommended labs to assess /monitor clinical status .  I discussed the assessment and treatment plan with the patient. The patient was provided an opportunity to ask questions and all were answered. The patient agreed with the plan and demonstrated an understanding of the instructions.  I provided over 30 minutes of exam, counseling, chart review and  complex critical decision making.         The patient was advised to call back or  seek an in-person evaluation if the symptoms worsen or if the condition fails to improve as anticipated.   Kirtland Bouchard, MD

## 2022-09-15 LAB — CBC WITH DIFFERENTIAL/PLATELET
Absolute Monocytes: 722 cells/uL (ref 200–950)
Basophils Absolute: 33 cells/uL (ref 0–200)
Basophils Relative: 0.4 %
Eosinophils Absolute: 107 cells/uL (ref 15–500)
Eosinophils Relative: 1.3 %
HCT: 39.9 % (ref 35.0–45.0)
Hemoglobin: 13.5 g/dL (ref 11.7–15.5)
Lymphs Abs: 1935 cells/uL (ref 850–3900)
MCH: 31.4 pg (ref 27.0–33.0)
MCHC: 33.8 g/dL (ref 32.0–36.0)
MCV: 92.8 fL (ref 80.0–100.0)
MPV: 9.8 fL (ref 7.5–12.5)
Monocytes Relative: 8.8 %
Neutro Abs: 5404 cells/uL (ref 1500–7800)
Neutrophils Relative %: 65.9 %
Platelets: 314 10*3/uL (ref 140–400)
RBC: 4.3 10*6/uL (ref 3.80–5.10)
RDW: 12.6 % (ref 11.0–15.0)
Total Lymphocyte: 23.6 %
WBC: 8.2 10*3/uL (ref 3.8–10.8)

## 2022-09-15 LAB — VITAMIN D 25 HYDROXY (VIT D DEFICIENCY, FRACTURES): Vit D, 25-Hydroxy: 33 ng/mL (ref 30–100)

## 2022-09-15 LAB — COMPLETE METABOLIC PANEL WITH GFR
AG Ratio: 2 (calc) (ref 1.0–2.5)
ALT: 17 U/L (ref 6–29)
AST: 18 U/L (ref 10–35)
Albumin: 4.3 g/dL (ref 3.6–5.1)
Alkaline phosphatase (APISO): 51 U/L (ref 37–153)
BUN/Creatinine Ratio: 19 (calc) (ref 6–22)
BUN: 22 mg/dL (ref 7–25)
CO2: 28 mmol/L (ref 20–32)
Calcium: 9.3 mg/dL (ref 8.6–10.4)
Chloride: 108 mmol/L (ref 98–110)
Creat: 1.17 mg/dL — ABNORMAL HIGH (ref 0.60–1.00)
Globulin: 2.2 g/dL (calc) (ref 1.9–3.7)
Glucose, Bld: 79 mg/dL (ref 65–99)
Potassium: 4.2 mmol/L (ref 3.5–5.3)
Sodium: 144 mmol/L (ref 135–146)
Total Bilirubin: 0.4 mg/dL (ref 0.2–1.2)
Total Protein: 6.5 g/dL (ref 6.1–8.1)
eGFR: 49 mL/min/{1.73_m2} — ABNORMAL LOW (ref >=60)

## 2022-09-15 LAB — LIPID PANEL
Cholesterol: 152 mg/dL (ref <200)
HDL: 56 mg/dL (ref >=50)
LDL Cholesterol (Calc): 74 mg/dL (calc)
Non-HDL Cholesterol (Calc): 96 mg/dL (calc) (ref <130)
Total CHOL/HDL Ratio: 2.7 (calc) (ref <5.0)
Triglycerides: 136 mg/dL (ref <150)

## 2022-09-15 LAB — HEMOGLOBIN A1C
Hgb A1c MFr Bld: 5.9 % of total Hgb — ABNORMAL HIGH (ref <5.7)
Mean Plasma Glucose: 123 mg/dL
eAG (mmol/L): 6.8 mmol/L

## 2022-09-15 LAB — MAGNESIUM: Magnesium: 2 mg/dL (ref 1.5–2.5)

## 2022-09-15 LAB — INSULIN, RANDOM: Insulin: 11.2 u[IU]/mL

## 2022-09-15 LAB — TSH: TSH: 1.79 mIU/L (ref 0.40–4.50)

## 2022-09-15 NOTE — Progress Notes (Signed)
<><><><><><><><><><><><><><><><><><><><><><><><><><><><><><><><><> <><><><><><><><><><><><><><><><><><><><><><><><><><><><><><><><><>  -   A1c still borderline             So    - Avoid Sweets, Candy & White Stuff   - White Rice, White [place], White Flour  - Breads &  Pasta    <><><><><><><><><><><><><><><><><><><><><><><><><><><><><><><><><> <><><><><><><><><><><><><><><><><><><><><><><><><><><><><><><><><>  -  Vitamin D = 33 - Extremely Low  !   - Vitamin D goal is between 70-100.   - Please make sure that you are taking your Vitamin D as directed.   - It is very important as a natural anti-inflammatory and helping the  immune system protect against viral infections, like the Covid-19   helping hair, skin, and nails, as well as reducing stroke and heart attack risk.   - It helps your bones and helps with mood.  - It also decreases numerous cancer risks so please  take it as directed.   - Low Vit D is associated with a 200-300% higher risk for  CANCER   and 200-300% higher risk for HEART   ATTACK  &  STROKE.    - It is also associated with higher death rate at younger ages,   autoimmune diseases like Rheumatoid arthritis, Lupus,  Multiple Sclerosis.     - Also many other serious conditions, like depression, Alzheimer's  Dementia, infertility, muscle aches, fatigue, fibromyalgia  <><><><><><><><><><><><><><><><><><><><><><><><><><><><><><><><><>  - All Else - CBC - Kidneys - Electrolytes - Liver - Magnesium & Thyroid    - all  Normal / OK <><><><><><><><><><><><><><><><><><><><><><><><><><><><><><><><><> <><><><><><><><><><><><><><><><><><><><><><><><><><><><><><><><><>

## 2022-09-27 DIAGNOSIS — D2261 Melanocytic nevi of right upper limb, including shoulder: Secondary | ICD-10-CM | POA: Diagnosis not present

## 2022-09-27 DIAGNOSIS — D2262 Melanocytic nevi of left upper limb, including shoulder: Secondary | ICD-10-CM | POA: Diagnosis not present

## 2022-09-27 DIAGNOSIS — D2272 Melanocytic nevi of left lower limb, including hip: Secondary | ICD-10-CM | POA: Diagnosis not present

## 2022-09-27 DIAGNOSIS — D225 Melanocytic nevi of trunk: Secondary | ICD-10-CM | POA: Diagnosis not present

## 2022-09-27 DIAGNOSIS — D0439 Carcinoma in situ of skin of other parts of face: Secondary | ICD-10-CM | POA: Diagnosis not present

## 2022-09-27 DIAGNOSIS — D485 Neoplasm of uncertain behavior of skin: Secondary | ICD-10-CM | POA: Diagnosis not present

## 2022-09-27 DIAGNOSIS — Z85828 Personal history of other malignant neoplasm of skin: Secondary | ICD-10-CM | POA: Diagnosis not present

## 2022-10-07 ENCOUNTER — Encounter: Payer: Self-pay | Admitting: Orthopedic Surgery

## 2022-10-07 ENCOUNTER — Ambulatory Visit (INDEPENDENT_AMBULATORY_CARE_PROVIDER_SITE_OTHER): Payer: HMO | Admitting: Orthopedic Surgery

## 2022-10-07 DIAGNOSIS — M5412 Radiculopathy, cervical region: Secondary | ICD-10-CM

## 2022-10-07 NOTE — Progress Notes (Signed)
Office Visit Note   Patient: Rebekah Hamilton           Date of Birth: [birth year]           MRN: [id number] Visit Date: 10/07/2022 Requested by: Unk Pinto, [address] [place],  [place] [address] PCP: Unk Pinto, MD  Subjective: Chief Complaint  Patient presents with   Follow-up    HPI: Rebekah Hamilton is a 77 y.o. female who presents to the office reporting bilateral neck and shoulder pain.  Prior epidural steroid injection in the cervical spine 08/12/2022 gave her very good relief for 1 month.  Now she reports severe worsening pain for the last 2 weeks which has moved from the right side to the left side.  Now the left side is as bad as the right side was.  Having a little bit of left shoulder popping but overall she is having radicular symptoms affecting the neck arms radiating below the elbows on both sides.  Pain does wake her from sleep at night.  Taking Tylenol p.m. with minimal relief.  She does have an MRI scan of her cervical spine which is reviewed today which does show 3 levels of disc compression of the cervical spine without myelomalacia..                ROS: All systems reviewed are negative as they relate to the chief complaint within the history of present illness.  Patient denies fevers or chills.  Assessment & Plan: Visit Diagnoses:  1. Radiculopathy, cervical region     Plan: Impression is underwhelming right shoulder MRI scan with underwhelming physical examination of the left shoulder today.  I think this is 90% plus coming from her neck.  We had an extensive and detailed discussion today about all options.  Also we revisited the course of her symptoms.  Plan at this time is to refer back to [facility] for cervical spine ESI.  Her current level of symptoms I do not think are really compatible with her desired pain-free functional envelope.  For that reason I will refer her also to Dr. Laurance Flatten for surgical evaluation of her cervical  spine.  Follow-up with me as needed.  Follow-Up Instructions: No follow-ups on file.   Orders:  Orders Placed This Encounter  Procedures   Epidural Steroid Injection - Cervical/Thoracic (Ancillary Performed)   Ambulatory referral to Orthopedic Surgery   No orders of the defined types were placed in this encounter.     Procedures: No procedures performed   Clinical Data: No additional findings.  Objective: Vital Signs: There were no vitals taken for this visit.  Physical Exam:  Constitutional: Patient appears well-developed HEENT:  Head: Normocephalic Eyes:EOM are normal Neck: Normal range of motion Cardiovascular: Normal rate Pulmonary/chest: Effort normal Neurologic: Patient is alert Skin: Skin is warm Psychiatric: Patient has normal mood and affect  Ortho Exam: Ortho exam demonstrates pretty reasonable cervical spine range of motion flexion chin to chest extension 30 degrees rotation is about 45 degrees bilaterally.  5 out of 5 grip EPL FPL interosseous wrist flexion extension bicep triceps and deltoid strength.  Grip strength is 5+ out of 5.  Patient has 5 out of 5 grip EPL FPL interosseous wrist flexion extension bicep triceps and deltoid strength.  Both shoulders have passive range of motion of 60/95/175.  Excellent rotator cuff strength infraspinatus supraspinatus and subscap muscle testing bilaterally.  No masses lymphadenopathy or skin changes noted in the shoulder  girdle regions.  No definite paresthesias C5-T1 bilaterally.  Reflexes 1+ out of 4 bilateral biceps triceps patella and Achilles.  She does have 4 out of 5 hip flexion strength bilaterally.  No coarse grinding or crepitus in the shoulders with passive range of motion.  Specialty Comments:  MRI CERVICAL SPINE WITHOUT CONTRAST   TECHNIQUE: Multiplanar, multisequence MR imaging of the cervical spine was performed. No intravenous contrast was administered.   COMPARISON:  Prior radiograph from 05/26/2022.    FINDINGS: Alignment: Straightening with mild reversal of the normal cervical lordosis. Trace stepwise degenerative retrolisthesis of C4 on C5, C5 on C6, and C6 on C7, with trace anterolisthesis of C7 on T1, T1 on T2, and T2 on T3. Findings likely chronic and facet mediated.   Vertebrae: Vertebral body height maintained without acute or chronic fracture. Bone marrow signal intensity within normal limits. 1 cm stir hyperintense lesion within the C3 vertebral body likely reflects an atypical hemangioma. No other discrete or worrisome osseous lesions. Mild discogenic reactive endplate changes present at C4-5 through C6-7. No other abnormal marrow edema.   Cord: Normal signal and morphology.   Posterior Fossa, vertebral arteries, paraspinal tissues: Chronic microvascular ischemic changes noted within the pons. Visualized brain and posterior fossa otherwise unremarkable. Craniocervical junction normal. Paraspinous soft tissues within normal limits. Normal flow voids seen within the vertebral arteries bilaterally. 1 cm right thyroid nodule noted, doubtful significance given size and patient age, no follow-up imaging recommended (ref: J Am Coll Radiol. 2015 Feb;12(2): 143-50).   Disc levels:   C2-C3: Left-sided uncovertebral spurring without significant disc bulge. Mild left-sided facet hypertrophy. No spinal stenosis. Mild left C3 foraminal narrowing. Right neural foramen remains patent.   C3-C4: Mild right eccentric disc bulge with bilateral uncovertebral spurring. Moderate right with mild left facet hypertrophy. No spinal stenosis. Moderate right with mild left C4 foraminal stenosis.   C4-C5: Degenerative intervertebral disc space narrowing with diffuse disc osteophyte complex. Broad posterior component effaces the ventral thecal sac. Superimposed mild facet and ligament flavum hypertrophy. Resultant moderate spinal stenosis with minimal cord flattening, but no cord signal changes.  Severe bilateral C5 foraminal stenosis.   C5-C6: Degenerative vertebral disc space narrowing with diffuse disc osteophyte complex. Broad posterior component flattens and partially effaces the ventral thecal sac, slightly asymmetric to the right. Secondary cord flattening without cord signal changes. Moderate to severe spinal stenosis. Severe bilateral C6 foraminal narrowing.   C6-C7: Degenerative intervertebral disc space narrowing with diffuse disc osteophyte complex. Superimposed right paracentral disc protrusion indents the right ventral thecal sac (series 8, image 20). Secondary cord flattening without cord signal changes. Moderate spinal stenosis. Severe left with mild right C7 foraminal stenosis.   C7-T1: Mild disc bulge with uncovertebral spurring. Moderate left with mild right facet hypertrophy. No spinal stenosis. Foramina remain patent.   IMPRESSION: 1. Multilevel cervical spondylosis with resultant moderate to severe spinal stenosis at C4-5 through C6-7. Particular note made of a superimposed right paracentral disc protrusion at C6-7, which could contribute to right-sided radicular symptoms. 2. Multifactorial degenerative changes with resultant multilevel foraminal narrowing as above. Notable findings include moderate right C4 foraminal stenosis, severe bilateral C5 and C6 foraminal narrowing, with severe left C7 foraminal stenosis.     Electronically Signed   By: Jeannine Boga M.D.   On: 06/05/2022 05:13  Imaging: No results found.   PMFS History: Patient Active Problem List   Diagnosis Date Noted   S/P lumbar fusion 11/12/2020   Chronic venous insufficiency 05/07/2019  History of 2019 novel coronavirus disease (COVID-19) 05/07/2019   Chronic left-sided low back pain with left-sided sciatica 07/02/2018   OAB (overactive bladder) 04/02/2018   CKD (chronic kidney disease), symptom management only, stage 2 (mild) 09/24/2017   Encounter for Medicare  annual wellness exam 06/02/2015   Glaucoma 06/02/2015   Vitamin D deficiency 10/21/2014   Medication management 12/01/2013   Hyperlipidemia, mixed    Essential hypertension    Abnormal glucose    Migraines    Allergy    Anxiety    Depression, major, recurrent (HCC)    Lumbar stenosis    Past Medical History:  Diagnosis Date   Allergy    Anemia    Anxiety    Depression    Hyperlipidemia    Hypertension    Lumbar stenosis    Migraines    Prediabetes     Family History  Problem Relation Age of Onset   Stroke Mother    Hypertension Mother    Heart attack Father 13   Other Maternal Grandmother        blood clot after surgery   Heart attack Paternal Grandmother     Past Surgical History:  Procedure Laterality Date   BREAST LUMPECTOMY Bilateral    CHOLECYSTECTOMY  1999   SPINE SURGERY     Social History   Occupational History   Occupation: Optometrist  Tobacco Use   Smoking status: Never   Smokeless tobacco: Never  Vaping Use   Vaping Use: Never used  Substance and Sexual Activity   Alcohol use: Yes    Alcohol/week: 2.0 standard drinks of alcohol    Types: 2 Glasses of wine per week   Drug use: No   Sexual activity: Not Currently

## 2022-10-14 ENCOUNTER — Ambulatory Visit
Admission: RE | Admit: 2022-10-14 | Discharge: 2022-10-14 | Disposition: A | Discharge status: Home / Self Care | Payer: HMO | Source: Ambulatory Visit | Attending: Orthopedic Surgery | Admitting: Orthopedic Surgery

## 2022-10-14 DIAGNOSIS — M5412 Radiculopathy, cervical region: Secondary | ICD-10-CM

## 2022-10-14 DIAGNOSIS — M47812 Spondylosis without myelopathy or radiculopathy, cervical region: Secondary | ICD-10-CM | POA: Diagnosis not present

## 2022-10-14 MED ORDER — TRIAMCINOLONE ACETONIDE 40 MG/ML IJ SUSP (RADIOLOGY)
60.0000 mg | Freq: Once | INTRAMUSCULAR | Status: AC
  Administered 2022-10-14: 60 mg via EPIDURAL

## 2022-10-14 MED ORDER — IOPAMIDOL (ISOVUE-M 300) INJECTION 61%
1.0000 mL | Freq: Once | INTRAMUSCULAR | Status: AC
  Administered 2022-10-14: 1 mL via EPIDURAL

## 2022-10-14 NOTE — Discharge Instructions (Signed)

## 2022-10-28 ENCOUNTER — Other Ambulatory Visit (INDEPENDENT_AMBULATORY_CARE_PROVIDER_SITE_OTHER): Payer: HMO

## 2022-10-28 ENCOUNTER — Ambulatory Visit (INDEPENDENT_AMBULATORY_CARE_PROVIDER_SITE_OTHER): Payer: HMO | Admitting: Orthopedic Surgery

## 2022-10-28 ENCOUNTER — Encounter: Payer: Self-pay | Admitting: Orthopedic Surgery

## 2022-10-28 VITALS — BP 151/86 | HR 66 | Ht 63.5 in | Wt 159.0 lb

## 2022-10-28 DIAGNOSIS — M5412 Radiculopathy, cervical region: Secondary | ICD-10-CM | POA: Diagnosis not present

## 2022-10-28 NOTE — Progress Notes (Signed)
Orthopedic Spine Surgery Office Note  Assessment: Patient is a 77 y.o. female with bilateral shoulder and arm pain   Plan: -Explained that initially conservative treatment is tried as a significant number of patients may experience relief with these treatment modalities. Discussed that the conservative treatments include:  -activity modification  -physical therapy  -over the counter pain medications  -medrol dosepak  -cervical steroid injections -Patient has tried tylenol, ibuprofen, cervical steroid injection, medrol dosepak -She has had symptoms for 5-6 months without any relief with conservative treatments so discussed ACDF as a treatment option since her compression is anterior. She is not interested in surgery at this time and wants to think about it further -Patient should return to office in 6 weeks, x-rays at next visit: none   Patient expressed understanding of the plan and all questions were answered to the patient's satisfaction.   ___________________________________________________________________________   History:  Patient is a 77 y.o. female who presents today for cervical spine. Patient has had 5-6 months of right shoulder and arm pain. Pain is felt over the anterior and lateral aspect of the shoulder and radiates into the right anterior arm. It does not radiate past the elbow. There was no trauma or injury that brought on the pain. She has developed pain within the last 2 months in the left upper extremity. Se feels it on the lateral shoulder and anterior arm, similar to the contralateral side. No radiation past the elbow on the left side. Denies paresthesias and numbness. Has pain even at rest when not using the arm. One of my partners referred her for a cervical injection and she has had good relief for about 2 months after that. Pain is still better than before the injection.     Weakness: denies Difficulty with fine motor skills (e.g., buttoning shirts, handwriting):  denies Symptoms of imbalance: denies Paresthesias and numbness: denies Bowel or bladder incontinence: denies Saddle anesthesia: denies  Treatments tried: tylenol, ibuprofen, cervical steroid injection, medrol dosepak  Review of systems: Denies fevers and chills, night sweats, unexplained weight loss, history of cancer, pain that wakes them at night  Past medical history: Migraines Depression HLD HTN  Allergies: augmentin, doxycycline, prozac  Past surgical history:  Lumbar spine surgery Cholecystectomy Lumpectomy  Social history: Denies use of nicotine product (smoking, vaping, patches, smokeless) Alcohol use: yes, 2 drinks per week Denies recreational drug use  Physical Exam:  BMI 27.7  General: no acute distress, appears stated age Neurologic: alert, answering questions appropriately, following commands Respiratory: unlabored breathing on room air, symmetric chest rise Psychiatric: appropriate affect, normal cadence to speech   MSK (spine):  -Strength exam      Left  Right Grip strength                5/5  5/5 Interosseus   5/5   5/5 Wrist extension  5/5  5/5 Wrist flexion   5/5  5/5 Elbow flexion   5/5  5/5 Deltoid    5/5  5/5  EHL    5/5  5/5 TA    5/5  5/5 GSC    5/5  5/5 Knee extension  5/5  5/5 Hip flexion   5/5  5/5  -Sensory exam    Sensation intact to light touch in L3-S1 nerve distributions of bilateral lower extremities  Sensation intact to light touch in C5-T1 nerve distributions of bilateral upper extremities  -Brachioradialis DTR: 1/4 on the left, 1/4 on the right -Biceps DTR: 1/4 on the left, 1/4  on the right -Achilles DTR: 1/4 on the left, 1/4 on the right -Patellar tendon DTR: 1/4 on the left, 1/4 on the right  -Spurling: negative bilaterally -Hoffman sign: negative bilaterally -Clonus: no beats bilaterally -Interosseous wasting: none seen -Grip and release test: negative -Romberg: negative -Gait: normal -Imbalance with  tandem gait: no  Left shoulder exam: no pain through range of motion, negative jobe, no weakness with external rotation with arm at side, negative belly press Right shoulder exam: no pain through range of motion, mild pain jobe but no weakness, no weakness with external rotation with arm at side, negative belly press  Tinel's at wrist: negative bilaterally Phalen's at wrist: negative bilaterally Durkan's: negative bilaterally  Tinel's at elbow: negative bilaterally  Imaging: XR of the cervical spine from 10/28/2022 was independently reviewed and interpreted, showing disc height loss at C4/5, C5/6, C6/7.  Anterior osteophyte formation at those disc bases as well.  No evidence of instability on flexion/extension views.  No fracture or dislocation seen.  MRI of the cervical spine from 06/03/2022 was independently reviewed and interpreted, showing central and bilateral foraminal stenosis at C4/5, central and bilateral foraminal stenosis at C5/6, right paracentral disc herniation at C6/7 with central stenosis at that level as well. Left sided foraminal stenosis at C6/7. No T2 cord signal change.    Patient name: Rebekah Hamilton Patient MRN: [id number] Date of visit: 10/28/22

## 2022-11-05 DIAGNOSIS — N951 Menopausal and female climacteric states: Secondary | ICD-10-CM | POA: Diagnosis not present

## 2022-11-05 DIAGNOSIS — E785 Hyperlipidemia, unspecified: Secondary | ICD-10-CM | POA: Diagnosis not present

## 2022-11-05 DIAGNOSIS — G8929 Other chronic pain: Secondary | ICD-10-CM | POA: Diagnosis not present

## 2022-11-05 DIAGNOSIS — E663 Overweight: Secondary | ICD-10-CM | POA: Diagnosis not present

## 2022-11-08 ENCOUNTER — Encounter: Payer: Self-pay | Admitting: Internal Medicine

## 2022-11-08 ENCOUNTER — Ambulatory Visit (INDEPENDENT_AMBULATORY_CARE_PROVIDER_SITE_OTHER): Payer: HMO | Admitting: Internal Medicine

## 2022-11-08 VITALS — BP 138/80 | HR 82 | Temp 97.9°F | Resp 17 | Ht 63.5 in | Wt 159.6 lb

## 2022-11-08 DIAGNOSIS — M4802 Spinal stenosis, cervical region: Secondary | ICD-10-CM

## 2022-11-08 DIAGNOSIS — R1031 Right lower quadrant pain: Secondary | ICD-10-CM | POA: Diagnosis not present

## 2022-11-08 DIAGNOSIS — B029 Zoster without complications: Secondary | ICD-10-CM

## 2022-11-08 MED ORDER — GABAPENTIN 300 MG PO CAPS: ORAL_CAPSULE | ORAL | 0 refills | Status: DC

## 2022-11-08 NOTE — Patient Instructions (Addendum)
Shingles  Shingles, which is also known as herpes zoster, is an infection that causes a painful skin rash and fluid-filled blisters. It is caused by a virus. Shingles only develops in people who: Have had chickenpox. Have been vaccinated against chickenpox. Shingles is rare in this group. What are the causes? Shingles is caused by varicella-zoster virus. This is the same virus that causes chickenpox. After a person is exposed to the virus, it stays in the body in an inactive (dormant) state. Shingles develops if the virus is reactivated. This can happen many years after the first (initial) exposure to the virus. It is not known what causes this virus to be reactivated. What increases the risk? People who have had chickenpox or received the chickenpox vaccine are at risk for shingles. Shingles infection is more common in people who: Are older than 76 years of age. Have a weakened disease-fighting system (immune system), such as people with: HIV (human immunodeficiency virus). AIDS (acquired immunodeficiency syndrome). Cancer. Are taking medicines that weaken the immune system, such as organ transplant medicines. Are experiencing a lot of stress. What are the signs or symptoms? Early symptoms of this condition include itching, tingling, and pain in an area on your skin. Pain may be described as burning, stabbing, or throbbing. A few days or weeks after early symptoms start, a painful red rash appears. The rash is usually on one side of the body and has a band-like or belt-like pattern. The rash eventually turns into fluid-filled blisters that break open, change into scabs, and dry up in about 2-3 weeks. At any time during the infection, you may also develop: A fever. Chills. A headache. Nausea. How is this diagnosed? This condition is diagnosed with a skin exam. Skin or fluid samples (a culture) may be taken from the blisters before a diagnosis is made. How is this treated? The rash may  last for several weeks. There is not a specific cure for this condition. Your health care provider may prescribe medicines to help you manage pain, recover more quickly, and avoid long-term problems. Medicines may include: Antiviral medicines. Anti-inflammatory medicines. Pain medicines. Anti-itching medicines (antihistamines). If the area involved is on your face, you may be referred to a specialist, such as an eye doctor (ophthalmologist) or an ear, nose, and throat (ENT) doctor (otorhinolaryngologist) to help you avoid eye problems, chronic pain, or disability. Follow these instructions at home: Medicines Take over-the-counter and prescription medicines only as told by your health care provider. Apply an anti-itch cream or numbing cream to the affected area as told by your health care provider. Relieving itching and discomfort  Apply cold, wet cloths (cold compresses) to the area of the rash or blisters as told by your health care provider. Cool baths can be soothing. Try adding baking soda or dry oatmeal to the water to reduce itching. Do not bathe in hot water. Use calamine lotion as recommended by your health care provider. This is an over-the-counter lotion that helps to relieve itchiness. Blister and rash care Keep your rash covered with a loose bandage (dressing). Wear loose-fitting clothing to help ease the pain of material rubbing against the rash. Wash your hands with soap and water for at least 20 seconds before and after you change your dressing. If soap and water are not available, use hand sanitizer. Change your dressing as told by your health care provider. Keep your rash and blisters clean by washing the area with mild soap and cool water as told by your   health care provider. Check your rash every day for signs of infection. Check for: More redness, swelling, or pain. Fluid or blood. Warmth. Pus or a bad smell. Do not scratch your rash or pick at your blisters. To help  avoid scratching: Keep your fingernails clean and cut short. Wear gloves or mittens while you sleep, if scratching is a problem. General instructions Rest as told by your health care provider. Wash your hands often with soap and water for at least 20 seconds. If soap and water are not available, use hand sanitizer. Doing this lowers your chance of getting a bacterial skin infection. Before your blisters change into scabs, your shingles infection can cause chickenpox in people who have never had it or have never been vaccinated against it. To prevent this from happening, avoid contact with other people, especially: Babies. Pregnant women. Children who have eczema. Older people who have transplants. People who have chronic illnesses, such as cancer or AIDS. Keep all follow-up visits. This is important. How is this prevented? Getting vaccinated is the best way to prevent shingles and protect against shingles complications. If you have not been vaccinated, talk with your health care provider about getting the vaccine. Where to find more information Centers for Disease Control and Prevention: http://www.wolf.info/ Contact a health care provider if: Your pain is not relieved with prescribed medicines. Your pain does not get better after the rash heals. You have any of these signs of infection: More redness, swelling, or pain around the rash. Fluid or blood coming from the rash. Warmth coming from your rash. Pus or a bad smell coming from the rash. A fever. Get help right away if: The rash is on your face or nose. You have facial pain, pain around your eye area, or loss of feeling on one side of your face. You have difficulty seeing. You have ear pain or have ringing in your ear. You have a loss of taste. Your condition gets worse. Summary Shingles, also known as herpes zoster, is an infection that causes a painful skin rash and fluid-filled blisters. This condition is diagnosed with a skin exam.  Skin or fluid samples (a culture) may be taken from the blisters. Keep your rash covered with a loose bandage (dressing). Wear loose-fitting clothing to help ease the pain of material rubbing against the rash. Before your blisters change into scabs, your shingles infection can cause chickenpox in people who have never had it or have never been vaccinated against it. ========================================== ========================================== Spinal Stenosis  Spinal stenosis is a condition that happens when the spinal canal narrows. The spinal canal is the space between the bones of your spine (vertebrae). This narrowing puts pressure on the spinal cord and nerves that exit the spine and run down the arms or legs. When nerves exiting the spine are pinched, it can cause pain, numbness, or weakness in the arms or legs. Spinal stenosis can affect the vertebrae in the neck, upper back, and lower back. Spinal stenosis can range from mild to severe. What are the causes? This condition is caused by areas of bone pushing into the spinal canal. This condition may be present at birth (congenital), or it may be caused by: Slow breakdown of your vertebrae (spinal degeneration). This usually starts between 81 and 12 years of age. Injury (trauma) to your spine. Previous spinal surgery. Tumors in your spine. Calcium deposits in your spine. What increases the risk? The following factors may make you more likely to develop this condition: Being  older than age 72. Being born with an abnormally shaped spine (congenitalspinal deformity), such as scoliosis. Having arthritis. What are the signs or symptoms? Symptoms of this condition include: Pain in the neck or back that is generally worse with activities, particularly when standing or walking. Numbness, tingling, hot or cold sensations, weakness, or tiredness (fatigue) in your arms or legs. This can happen in one arm or leg, or both. Pain going from the  buttock, down the thigh, and to the calf (sciatica). This can happen in one or both legs. Falling frequently. Foot drop. This is when you have trouble lifting the front part of your foot and it drags on the ground when you walk. This can lead to muscle weakness. In more severe cases, you may develop: Problems having a bowel movement or urinating. Difficulty having sex. Loss of feeling in your legs and inability to walk. Symptoms may come on slowly and get worse over time. In some cases, there are no symptoms. How is this diagnosed? This condition is diagnosed based on your medical history and a physical exam. You may also have tests, such as an X-ray, CT scan, or MRI. How is this treated? Treatment for this condition often focuses on managing your pain and any other symptoms. Treatment may include: Practicing good posture to lessen pressure on your nerves. Exercises to strengthen muscles, build endurance, improve balance, and maintain range of motion. This may include physical therapy to restore movement and strength to your back. Losing weight, if needed. Medicines to reduce inflammation or pain. This may include a medicine that is injected into your spine (steroidinjection). Assistive devices, such as a corset or brace. In some cases, surgery may be needed. The most common procedure is decompression laminectomy. This removes excess bone that puts pressure on your nerve roots. Follow these instructions at home: Managing pain, stiffness, and swelling  Practice good posture. If you were given a brace or a corset, wear it as told by your health care provider. Maintain a healthy weight. Talk with your health care provider if you need help losing weight. If directed, apply heat to the affected area as often as told by your health care provider. Use the heat source that your health care provider recommends, such as a moist heat pack or a heating pad. Place a towel between your skin and the heat  source. Leave the heat on for 20-30 minutes. If your skin turns bright red, remove the heat right away to prevent burns. The risk of burns is higher if you cannot feel pain, heat, or cold. Activity Do all exercises and stretches as told by your health care provider. Do not do any activities that cause pain. You may have to avoid lifting. Ask your health care provider how much you can safely lift. Return to your normal activities as told by your health care provider. Ask your health care provider what activities are safe for you. General instructions Take over-the-counter and prescription medicines only as told by your health care provider. Do not use any products that contain nicotine or tobacco. These products include cigarettes, chewing tobacco, and vaping devices, such as e-cigarettes. If you need help quitting, ask your health care provider. Eat a healthy diet. This includes plenty of fruits and vegetables, whole grains, and low-fat (lean) protein. Where to find more information [employer] of Arthritis and Musculoskeletal and Skin Diseases: www.niams.SouthExposed.es Contact a health care provider if: Your symptoms do not get better or they get worse. You have a  fever. Get help right away if: You have new pain or symptoms of severe pain, such as: New or worsening pain in your neck or upper back. Severe pain that cannot be controlled with medicines. A severe headache that gets worse when you stand. You are dizzy. You have vision problems, such as blurred vision or double vision. You have nausea or vomiting. You develop new or worsening numbness or tingling in your back or legs. You lose control of your bowels or bladder. You have pain, redness, swelling, or warmth in your arm or leg. These symptoms may be an emergency. Get help right away. Call 911. Do not wait to see if the symptoms will go away. Do not drive yourself to the hospital. Summary Spinal stenosis is a condition that  happens when the spinal canal narrows, putting pressure on the spinal cord or nerves that exit the vertebrae. This condition is caused by areas of bone pushing into the spinal canal. Spinal stenosis can cause numbness, weakness, or pain in the buttocks, neck, back, arms, and legs. This condition is usually diagnosed with your medical history, a physical exam, and tests, such as an X-ray, CT scan, or MRI. ======================================== ========================================

## 2022-11-08 NOTE — Progress Notes (Unsigned)
     Future Appointments  Date Time Provider Department  11/08/2022  2:30 PM Unk Pinto, MD [facility]  12/09/2022  9:15 AM Callie Fielding, MD [facility]  12/14/2022  2:30 PM Alycia Rossetti, NP [facility]  06/14/2023  2:00 PM Unk Pinto, MD [facility]    History of Present Illness:      This very nice 77 y.o. DWF  underwent  extensive T9- S2  spine surgery by Dr. Jenne Campus at [facility]. She continues to c/o some weakness in her Rt leg.      Current Outpatient Medications on File Prior to Visit  Medication Sig   ALPRAZolam (XANAX) 0.25 MG tablet Take 1/2 to 1 tablet 2 to 3 x / day ONLY if needed    Ascorbic Acid (VITAMIN C PO) 500 mg 2 (two) times daily.   EXCEDRIN ( E S ) 250-250-65 MG tablet Take 1 tablet by mouth daily.   buPROPion  XL 150 MG 24 hr tablet Take 1 tablet every Morning for Mood, Focus & Concentration   VITAMIN D  Take by mouth. Takes 10000 to 12000 units daily.   DULoxetine (CYMBALTA) 30 MG capsule Take  3 capsules (90 mg)  Daily  for Mood & Chronic Pain   estradiol (ESTRACE) 0.5 MG tablet Take  1 tablet  Daily  for Estrogen Deficiency   fexofenadine (ALLEGRA) 180 MG tablet Take 180 mg by mouth daily.   hydrochlorothiazide 25 MG tablet Take 1/2 -1 tablet daily for BP and fluid for goal <130/80.   pravastatin (PRAVACHOL) 40 MG tablet Take  1 tablet at Bedtime   progesterone (PROMETRIUM) 100 MG capsule Take  1 capsule  Daily   rizatriptan (MAXALT) 10 MG tablet Take  1 tablet  for Migraine & may repeat 1 x in 2 hrs      Allergies  Allergen Reactions   Augmentin [Amoxicillin-Pot Clavulanate] Diarrhea   Doxycycline Nausea Only   Egg White (Diagnostic) Nausea Only   Prozac [Fluoxetine Hcl] Other (See Comments)    Disoriented     Problem list She has Hyperlipidemia, mixed; Essential hypertension; Abnormal glucose; Migraines; Allergy; Anxiety; Depression, major, recurrent ([place]); Lumbar stenosis; Medication management; Vitamin D deficiency; Encounter  for Medicare annual wellness exam; Glaucoma; CKD (chronic kidney disease), symptom management only, stage 2 (mild); OAB (overactive bladder); Chronic left-sided low back pain with left-sided sciatica; Chronic venous insufficiency; History of 2019 novel coronavirus disease (COVID-19); and S/P lumbar fusion on their problem list.   Observations/Objective:  There were no vitals taken for this visit.  HEENT - WNL. Neck - supple.  Chest - Clear equal BS. Cor - Nl HS. RRR w/o sig MGR. PP 1(+). No edema. MS- FROM w/o deformities.  Gait Nl. Neuro -  Nl w/o focal abnormalities.   Assessment and Plan:      Follow Up Instructions:        I discussed the assessment and treatment plan with the patient. The patient was provided an opportunity to ask questions and all were answered. The patient agreed with the plan and demonstrated an understanding of the instructions.       The patient was advised to call back or seek an in-person evaluation if the symptoms worsen or if the condition fails to improve as anticipated.    Kirtland Bouchard, MD

## 2022-11-10 ENCOUNTER — Other Ambulatory Visit: Payer: Self-pay | Admitting: Internal Medicine

## 2022-11-10 MED ORDER — PSEUDOEPHEDRINE HCL ER 120 MG PO TB12: ORAL_TABLET | ORAL | 3 refills | Status: DC

## 2022-11-10 MED ORDER — DEXAMETHASONE 4 MG PO TABS: ORAL_TABLET | ORAL | 0 refills | Status: DC

## 2022-11-10 MED ORDER — AZITHROMYCIN 250 MG PO TABS: ORAL_TABLET | ORAL | 1 refills | Status: DC

## 2022-11-16 ENCOUNTER — Ambulatory Visit: Payer: HMO | Admitting: Nurse Practitioner

## 2022-11-16 ENCOUNTER — Ambulatory Visit
Admission: RE | Admit: 2022-11-16 | Discharge: 2022-11-16 | Disposition: A | Discharge status: Home / Self Care | Payer: HMO | Source: Ambulatory Visit | Attending: Internal Medicine | Admitting: Internal Medicine

## 2022-11-16 DIAGNOSIS — I700 Atherosclerosis of aorta: Secondary | ICD-10-CM | POA: Diagnosis not present

## 2022-11-16 DIAGNOSIS — R1031 Right lower quadrant pain: Secondary | ICD-10-CM | POA: Diagnosis not present

## 2022-11-16 DIAGNOSIS — R109 Unspecified abdominal pain: Secondary | ICD-10-CM | POA: Diagnosis not present

## 2022-11-16 LAB — POCT I-STAT CREATININE: Creatinine, Ser: 1.2 mg/dL — ABNORMAL HIGH (ref 0.44–1.00)

## 2022-11-16 MED ORDER — IOHEXOL 300 MG/ML  SOLN
100.0000 mL | Freq: Once | INTRAMUSCULAR | Status: AC | PRN
  Administered 2022-11-16: 80 mL via INTRAVENOUS

## 2022-11-16 NOTE — Progress Notes (Signed)
<><><><><><><><><><><><><><><><><><><><><><><><><><><><><><><><><> <><><><><><><><><><><><><><><><><><><><><><><><><><><><><><><><><>  -   CT Abd returned "OK" - No significant Abnormalities -  <><><><><><><><><><><><><><><><><><><><><><><><><><><><><><><><><> <><><><><><><><><><><><><><><><><><><><><><><><><><><><><><><><><>

## 2022-12-01 ENCOUNTER — Other Ambulatory Visit: Payer: Self-pay | Admitting: Nurse Practitioner

## 2022-12-01 DIAGNOSIS — G43009 Migraine without aura, not intractable, without status migrainosus: Secondary | ICD-10-CM

## 2022-12-03 DIAGNOSIS — W0110XA Fall on same level from slipping, tripping and stumbling with subsequent striking against unspecified object, initial encounter: Secondary | ICD-10-CM | POA: Diagnosis not present

## 2022-12-03 DIAGNOSIS — S0101XA Laceration without foreign body of scalp, initial encounter: Secondary | ICD-10-CM | POA: Diagnosis not present

## 2022-12-03 DIAGNOSIS — E876 Hypokalemia: Secondary | ICD-10-CM | POA: Diagnosis not present

## 2022-12-03 DIAGNOSIS — R42 Dizziness and giddiness: Secondary | ICD-10-CM | POA: Diagnosis not present

## 2022-12-03 DIAGNOSIS — R2681 Unsteadiness on feet: Secondary | ICD-10-CM | POA: Diagnosis not present

## 2022-12-03 DIAGNOSIS — M4802 Spinal stenosis, cervical region: Secondary | ICD-10-CM | POA: Diagnosis not present

## 2022-12-03 DIAGNOSIS — Z23 Encounter for immunization: Secondary | ICD-10-CM | POA: Diagnosis not present

## 2022-12-05 ENCOUNTER — Ambulatory Visit (INDEPENDENT_AMBULATORY_CARE_PROVIDER_SITE_OTHER): Payer: HMO | Admitting: Internal Medicine

## 2022-12-05 ENCOUNTER — Encounter: Payer: Self-pay | Admitting: Internal Medicine

## 2022-12-05 VITALS — BP 160/90 | HR 96 | Temp 97.9°F | Resp 16 | Ht 63.5 in | Wt 160.6 lb

## 2022-12-05 DIAGNOSIS — R55 Syncope and collapse: Secondary | ICD-10-CM

## 2022-12-05 DIAGNOSIS — R42 Dizziness and giddiness: Secondary | ICD-10-CM | POA: Diagnosis not present

## 2022-12-05 DIAGNOSIS — Z6828 Body mass index (BMI) 28.0-28.9, adult: Secondary | ICD-10-CM | POA: Diagnosis not present

## 2022-12-05 DIAGNOSIS — R0989 Other specified symptoms and signs involving the circulatory and respiratory systems: Secondary | ICD-10-CM | POA: Diagnosis not present

## 2022-12-05 DIAGNOSIS — S0083XD Contusion of other part of head, subsequent encounter: Secondary | ICD-10-CM

## 2022-12-05 DIAGNOSIS — S0181XS Laceration without foreign body of other part of head, sequela: Secondary | ICD-10-CM

## 2022-12-05 DIAGNOSIS — E663 Overweight: Secondary | ICD-10-CM | POA: Diagnosis not present

## 2022-12-05 MED ORDER — PHENTERMINE HCL 37.5 MG PO TABS: ORAL_TABLET | ORAL | 1 refills | Status: DC

## 2022-12-05 MED ORDER — TOPIRAMATE 50 MG PO TABS: ORAL_TABLET | ORAL | 1 refills | Status: DC

## 2022-12-05 NOTE — Progress Notes (Signed)
Future Appointments  Date Time Provider Department  12/09/2022  9:15 AM [name], MD [facility]  12/14/2022  2:30 PM [name], NP [facility]  06/14/2023  2:00 PM [name], MD [facility]    History of Present Illness:      The patient is a very nice 77 y.o. DWF   with HTN, HLD, Pre-Diabetes and Vitamin D Deficiency who approx 24-36 hours ago [place] & [place]  w/o complete LOC  and striking her forehead against a door frame  sustained a laceration of her forehead. She called her son who took her to a local ER & she had Head CT scan & "labs"  which were reported to her as negative & normal and she was released for close f/u on return [facility]. She denies actually tripping, fainting, antecedent sx's as palpitations, CP, dyspnea , N/V or Diaphoresis.      She also inquires about taking some weight loss meds being about 25# overweight.    Current Outpatient Medications on File Prior to Visit  Medication Sig   ALPRAZolam (XANAX) 0.25 MG tab Take 1/2 to 1 tablet 2 to 3 x / day ONLY if needed    VITAMIN C   500 mg 2 times daily.   EXCEDRIN ES 244-010-27 MG  Take 1 tablet  daily.   VITAMIN D Takes 25366 to 12000 units daily.   DULoxetine  30 MG cap Take  3 capsules (90 mg)  Daily     estradiol  0.5 MG tablet Take  1 tablet  Daily    fexofenadine 180 MG tab Take 1 daily.   gabapentin 300 MG cap Take 1 capsule 3 x /day for  Pain   hydrochlorothiazide 25 MG tab Take 1/2 -1 tablet daily   pravastatin  40 MG tab Take  1 tablet at Bedtime    progesterone 100 MG cap Take  1 capsule  Daily   pseudoephedrine  120 MG 12 hr tab Take  1 tablet  2 x /day (every 12 hrs)     MAXALT) 10 MG tab Take  1 tablet  for Migraine & may repeat 1 x in 2 hrs    buPROPion  XL 150 MG Take 1 tablet every Morning     Allergies  Allergen Reactions   Augmentin [Amoxicillin-Pot Clavulanate] Diarrhea   Doxycycline Nausea Only   Egg  White (Diagnostic) Nausea Only   Prozac [Fluoxetine Hcl] Other (See Comments)    Disoriented     Problem list She has Hyperlipidemia, mixed; Essential hypertension; Abnormal glucose; Migraines; Allergy; Anxiety; Depression, major, recurrent (HCC); Lumbar stenosis; Vitamin D deficiency; Glaucoma; CKD (chronic kidney disease), symptom management only, stage 2 (mild); OAB (overactive bladder); Chronic left-sided low back pain with left-sided sciatica; Chronic venous insufficiency; History of 2019 novel coronavirus disease (COVID-19); and S/P lumbar fusion on their problem list.   Observations/Objective:  BP (!) 160/90   Pulse 96   Temp 97.9 F (36.6 C)   Resp 16   Ht 5' 3.5" (1.613 m)   Wt 160 lb 9.6 oz (72.8 kg)   SpO2 97%   BMI 28.00 kg/m   Postural    Sitting BP 178/110    P 79         &      Standing BP 159/96     P  84   HEENT - WNL except there is a 3"  downward curvilinear sutured laceration  over the Rt Frontal forehead & wound appears  well aligned with a  running suture of approx #15 sutures.  Also, wound appears  clean w/o signs of erythema , drainage  or STS.  Wound was cleaned with soap & water , quenched with H2O2 & Abx Ung applied. Sterile bandages were applied. Neck - supple.  Chest - Clear equal BS. Cor - Nl HS. RRR w/o sig MGR. PP 1(+). No edema. MS- FROM w/o deformities.  Gait Nl. Neuro -  Nl w/o focal abnormalities.  Assessment and Plan:  1. Postural dizziness with presyncope   2. Contusion of forehead, subsequent encounter   3. Forehead laceration, sequela   4. Labile hypertension   5. Overweight with body mass index (BMI) of 28 to 28.9 in adult  - phentermine  37.5 MG tablet;  Take 1/2 to 1 tablet every Morning Dispense: 90 tablet; Refill: 1  - Topiramate 50 MG tablet;  Take 1/2 to 1 tablet 2 x /day at Suppertime & Bedtime Dispense: 180 tablet; Refill: 1    Recc ROV 5 days to recheck wound for possible  infection or suture  removal          I discussed the assessment and treatment plan with the patient. The patient was provided an opportunity to ask questions and all were answered. The patient agreed with the plan and demonstrated an understanding of the instructions.  Over 25 minutes of counseling,  chart review and critical decision making was performed        The patient was advised to call back or seek an in-person evaluation if the symptoms worsen or if the condition fails to improve as anticipated.   [name], MD

## 2022-12-08 NOTE — Progress Notes (Signed)
Future Appointments  Date Time Provider Department  12/09/2022  9:15 AM [name], MD [facility]  12/09/2022 11:45 AM [name], MD [facility]  12/14/2022  2:30 PM [name], NP [facility]  06/14/2023  2:00 PM [name], MD [facility]    History of Present Illness:     History of Present Illness:      The patient is a very nice 77 y.o. DWF   with HTN, HLD, Pre-Diabetes and Vitamin D Deficiency who returns for recheck of a forehead laceration repaired  4/20 in an ER.    Current Outpatient Medications on File Prior to Visit  Medication Sig   ALPRAZolam (XANAX) 0.25 MG tablet Take 1/2 to 1 tablet 2 to 3 x / day ONLY if needed for Acute Anxiety Attacks  & please try to limit to 5 days / week to avoid addiction   Ascorbic Acid (VITAMIN C PO) 500 mg 2 (two) times daily.   aspirin-acetaminophen-caffeine (EXCEDRIN EXTRA STRENGTH) 250-250-65 MG tablet Take 1 tablet by mouth daily.   buPROPion (WELLBUTRIN XL) 150 MG 24 hr tablet Take 1 tablet every Morning for Mood, Focus & Concentration   Cholecalciferol (VITAMIN D PO) Take by mouth. Takes 16109 to 12000 units daily.   DULoxetine (CYMBALTA) 30 MG capsule Take  3 capsules (90 mg)  Daily  for Mood & Chronic Pain   Elastic Bandages & Supports (MEDICAL COMPRESSION STOCKINGS) MISC Knee high compression socks 20-30 pressure, measure and fit   estradiol (ESTRACE) 0.5 MG tablet Take  1 tablet  Daily  for Estrogen Deficiency   fexofenadine (ALLEGRA) 180 MG tablet Take 180 mg by mouth daily.   gabapentin (NEURONTIN) 300 MG capsule Take 1 capsule 3 x /day for  Pain   hydrochlorothiazide (HYDRODIURIL) 25 MG tablet Take 1/2 -1 tablet daily for BP and fluid for goal <130/80.   phentermine (ADIPEX-P) 37.5 MG tablet Take 1/2 to 1 tablet every Morning for Dieting & Weight Loss   pravastatin (PRAVACHOL) 40 MG tablet Take  1 tablet at Bedtime for Cholesterol                                /                                        TAKE                             BY                    MOUTH   progesterone (PROMETRIUM) 100 MG capsule Take  1 capsule  Daily   pseudoephedrine (SUDAFED) 120 MG 12 hr tablet Take  1 tablet  2 x /day (every 12 hours)  for Head and Chest Congestion   rizatriptan (MAXALT) 10 MG tablet Take  1 tablet  for Migraine & may repeat 1 x in 2 hrs -[ Maximum 2 tablets  /24 hours ]   topiramate (TOPAMAX) 50 MG tablet Take 1/2 to 1 tablet 2 x /day at Suppertime & Bedtime for Dieting & Weight Loss     Allergies  Allergen Reactions   Augmentin [Amoxicillin-Pot Clavulanate] Diarrhea   Doxycycline Nausea Only   Egg White (Diagnostic) Nausea Only   Prozac [Fluoxetine Hcl]  Other (See Comments)    Disoriented     Problem list She has Hyperlipidemia, mixed; Essential hypertension; Abnormal glucose; Migraines; Allergy; Anxiety; Depression, major, recurrent (HCC); Lumbar stenosis; Vitamin D deficiency; Glaucoma; CKD (chronic kidney disease), symptom management only, stage 2 (mild); OAB (overactive bladder); Chronic left-sided low back pain with left-sided sciatica; Chronic venous insufficiency; History of 2019 novel coronavirus disease (COVID-19); S/P lumbar fusion; Overweight with body mass index (BMI) of 28 to 28.9 in adult; and Postural dizziness with presyncope on their problem list.   Observations/Objective:  BP (!) 158/80   Pulse 95   Temp 97.9 F (36.6 C)   Resp 16   Ht 5' 3.5" (1.613 m)   Wt 159 lb (72.1 kg)   SpO2 97%   BMI 27.72 kg/m   Forehead wound appears clean, healing w/o signs of infection.   Assessment and Plan:      Follow Up Instructions:        I discussed the assessment and treatment plan with the patient. The patient was provided an opportunity to ask questions and all were answered. The patient agreed with the plan and demonstrated an understanding of the instructions.       The patient was advised to call back or seek an in-person evaluation if the symptoms worsen or if the  condition fails to improve as anticipated.    [name], MD

## 2022-12-09 ENCOUNTER — Ambulatory Visit (INDEPENDENT_AMBULATORY_CARE_PROVIDER_SITE_OTHER): Payer: HMO | Admitting: Orthopedic Surgery

## 2022-12-09 ENCOUNTER — Encounter: Payer: Self-pay | Admitting: Internal Medicine

## 2022-12-09 ENCOUNTER — Ambulatory Visit: Payer: HMO | Admitting: Internal Medicine

## 2022-12-09 VITALS — BP 158/80 | HR 95 | Temp 97.9°F | Resp 16 | Ht 63.5 in | Wt 159.0 lb

## 2022-12-09 DIAGNOSIS — M5412 Radiculopathy, cervical region: Secondary | ICD-10-CM | POA: Diagnosis not present

## 2022-12-09 DIAGNOSIS — G8929 Other chronic pain: Secondary | ICD-10-CM

## 2022-12-09 DIAGNOSIS — S0181XD Laceration without foreign body of other part of head, subsequent encounter: Secondary | ICD-10-CM

## 2022-12-09 MED ORDER — DEXAMETHASONE 4 MG PO TABS: ORAL_TABLET | ORAL | 0 refills | Status: DC

## 2022-12-09 NOTE — Progress Notes (Signed)
Orthopedic Spine Surgery Office Note  Assessment: Patient is a 77 y.o. female with neck pain that radiates into the bilateral shoulders and arms.  Has multiple levels of cervical foraminal stenosis   Plan: -Patient has tried tylenol, ibuprofen, cervical steroid injection, medrol dosepak  -Patient was wondering if her cervical foraminal stenosis is related to the syncope.  I explained that the two are unrelated.  She is supposed to see her primary care provider to continue the workup that was started in the hospital. -I explained that an ACDF would be her treatment for her cervical radiculopathy, but she understandably wanted to hold off until the syncopal workup is completed -Patient should return to office in 6 weeks, x-rays at next visit: none   Patient expressed understanding of the plan and all questions were answered to the patient's satisfaction.   __________________________________________________________________________  History: Patient is a 76 y.o. female who has been previously seen in the office for symptoms consistent with cervical radiculopathy.  Patient has pain that starts in the neck and radiates into the bilateral anterior lateral aspect of the shoulders.  On the right side, the pain radiates along the anterior aspect of the arm.  Pain does not radiate past the elbow.  She has not noticed any paresthesias or numbness.  She has not noticed any weakness.  She has no issues with fine motor skills in the hands or trouble with balance.  Of note, she was at her grandson's wedding recently and had a syncopal episode.  She states that she was in the hotel and blacked out.  She was brought to the hospital and a workup was started.  She says that she is supposed to see her primary care doctor today to continue the workup.  Previous treatments:  tylenol, ibuprofen, cervical steroid injection, medrol dosepak    Physical Exam:  BMI of 28.0  General: no acute distress, appears stated  age Neurologic: alert, answering questions appropriately, following commands Respiratory: unlabored breathing on room air, symmetric chest rise Psychiatric: appropriate affect, normal cadence to speech   MSK (spine):  -Strength exam      Left  Right Grip strength                5/5  5/5 Interosseus   5/5   5/5 Wrist extension  5/5  5/5 Wrist flexion   5/5  5/5 Elbow flexion   5/5  5/5 Deltoid    5/5  5/5  EHL    5/5  5/5 TA    5/5  5/5 GSC    5/5  5/5 Knee extension  5/5  5/5 Hip flexion   5/5  5/5  -Sensory exam    Sensation intact to light touch in L3-S1 nerve distributions of bilateral lower extremities  Sensation intact to light touch in C5-T1 nerve distributions of bilateral upper extremities  -Brachioradialis DTR: 1/4 on the left, 1/4 on the right -Biceps DTR: 1/4 on the left, 1/4 on the right -Achilles DTR: 1/4 on the left, 1/4 on the right -Patellar tendon DTR: 1/4 on the left, 1/4 on the right  -Spurling: negative bilaterally -Hoffman sign: negative bilaterally -Clonus: no beats bilaterally -Interosseous wasting: none seen -Grip and release test: negative -Romberg: negative -Gait: normal -Imbalance with tandem gait: no   Left shoulder exam: no pain through range of motion, negative jobe, no weakness with external rotation with arm at side, negative belly press Right shoulder exam: no pain through range of motion, mild pain jobe but no  weakness, no weakness with external rotation with arm at side, negative belly press  Tinel's at wrist: negative bilaterally Phalen's at wrist: negative bilaterally Durkan's: negative bilaterally  Tinel's at elbow: negative bilaterally  Imaging: XR of the cervical spine from 10/28/2022 was previously independently reviewed and interpreted, showing disc height loss at C4/5, C5/6, C6/7.  Anterior osteophyte formation at those disc bases as well.  No evidence of instability on flexion/extension views.  No fracture or dislocation  seen.   MRI of the cervical spine from 06/03/2022 was previously independently reviewed and interpreted, showing central and bilateral foraminal stenosis at C4/5, central and bilateral foraminal stenosis at C5/6, right paracentral disc herniation at C6/7 with central stenosis at that level as well. Left sided foraminal stenosis at C6/7. No T2 cord signal change.     Patient name: Rebekah Hamilton Patient MRN: [id number] Date of visit: 12/09/22

## 2022-12-12 NOTE — Progress Notes (Unsigned)
Future Appointments  Date Time Provider Department  12/13/2022  1:45 PM [name], MD [facility]  12/14/2022  2:30 PM [name], NP [facility]  01/20/2023  9:15 AM Rebekah Sheer, MD [facility]  06/14/2023  2:00 PM [name], MD [facility]    History of Present Illness:      The patient is a very nice 77 y.o. DWF   with HTN, HLD, Pre-Diabetes and Vitamin D Deficiency who returns for 10 day recheck of a forehead laceration repaired  4/20 in an ER.   Current Outpatient Medications on File Prior to Visit  Medication Sig   ALPRAZolam (XANAX) 0.25 MG tablet Take 1/2 to 1 tablet 2 to 3 x / day ONLY if needed for Acute Anxiety Attacks  & please try to limit to 5 days / week to avoid addiction   Ascorbic Acid (VITAMIN C PO) 500 mg 2 (two) times daily.   aspirin-acetaminophen-caffeine (EXCEDRIN EXTRA STRENGTH) 250-250-65 MG tablet Take 1 tablet by mouth daily.   buPROPion (WELLBUTRIN XL) 150 MG 24 hr tablet Take 1 tablet every Morning for Mood, Focus & Concentration   Cholecalciferol (VITAMIN D PO) Take by mouth. Takes 16109 to 12000 units daily.   dexamethasone (DECADRON) 4 MG tablet Take 1 tab 3 x day - 3 days, then 2 x day - 3 days, then 1 tab daily   DULoxetine (CYMBALTA) 30 MG capsule Take  3 capsules (90 mg)  Daily  for Mood & Chronic Pain   Elastic Bandages & Supports (MEDICAL COMPRESSION STOCKINGS) MISC Knee high compression socks 20-30 pressure, measure and fit   estradiol (ESTRACE) 0.5 MG tablet Take  1 tablet  Daily  for Estrogen Deficiency   fexofenadine (ALLEGRA) 180 MG tablet Take 180 mg by mouth daily.   gabapentin (NEURONTIN) 300 MG capsule Take 1 capsule 3 x /day for  Pain   hydrochlorothiazide (HYDRODIURIL) 25 MG tablet Take 1/2 -1 tablet daily for BP and fluid for goal <130/80.   phentermine (ADIPEX-P) 37.5 MG tablet Take 1/2 to 1 tablet every Morning for Dieting & Weight Loss   pravastatin (PRAVACHOL) 40 MG tablet Take  1 tablet at Bedtime for Cholesterol                                 /                                        TAKE                            BY                    MOUTH   progesterone (PROMETRIUM) 100 MG capsule Take  1 capsule  Daily   pseudoephedrine (SUDAFED) 120 MG 12 hr tablet Take  1 tablet  2 x /day (every 12 hours)  for Head and Chest Congestion   rizatriptan (MAXALT) 10 MG tablet Take  1 tablet  for Migraine & may repeat 1 x in 2 hrs -[ Maximum 2 tablets  /24 hours ]   topiramate (TOPAMAX) 50 MG tablet Take 1/2 to 1 tablet 2 x /day at Suppertime & Bedtime for Dieting & Weight Loss     Allergies  Allergen Reactions  Augmentin [Amoxicillin-Pot Clavulanate] Diarrhea   Doxycycline Nausea Only   Egg White (Diagnostic) Nausea Only   Prozac [Fluoxetine Hcl] Other (See Comments)    Disoriented     Problem list She has Hyperlipidemia, mixed; Essential hypertension; Abnormal glucose; Migraines; Allergy; Anxiety; Depression, major, recurrent (HCC); Lumbar stenosis; Vitamin D deficiency; Glaucoma; CKD (chronic kidney disease), symptom management only, stage 2 (mild); OAB (overactive bladder); Chronic left-sided low back pain with left-sided sciatica; Chronic venous insufficiency; History of 2019 novel coronavirus disease (COVID-19); S/P lumbar fusion; Overweight with body mass index (BMI) of 28 to 28.9 in adult; and Postural dizziness with presyncope on their problem list.   Observations/Objective:  BP (!) 152/80   Pulse 100   Temp 97.9 F (36.6 C)   Resp 17   Ht 5' 3.5" (1.613 m)   Wt 159 lb (72.1 kg)   SpO2 99%   BMI 27.72 kg/m   Forehead wound well healed w/o signs of infection & running proline sutures  removed w/o difficult. Steri-strips applied   Assessment and Plan:   Laceration forehead    Follow Up Instructions:        I discussed the assessment and treatment plan with the patient. The patient was provided an opportunity to ask questions and all were answered. The patient agreed with the plan and  demonstrated an understanding of the instructions.       The patient was advised to call back or seek an in-person evaluation if the symptoms worsen or if the condition fails to improve as anticipated.    Rebekah Maw, MD

## 2022-12-13 ENCOUNTER — Ambulatory Visit: Payer: HMO | Admitting: Internal Medicine

## 2022-12-13 ENCOUNTER — Encounter: Payer: Self-pay | Admitting: Internal Medicine

## 2022-12-13 VITALS — BP 152/80 | HR 100 | Temp 97.9°F | Resp 17 | Ht 63.5 in | Wt 159.0 lb

## 2022-12-13 DIAGNOSIS — S0181XD Laceration without foreign body of other part of head, subsequent encounter: Secondary | ICD-10-CM

## 2022-12-13 NOTE — Progress Notes (Unsigned)
MEDICARE ANNUAL WELLNESS VISIT AND FOLLOW UP  Assessment:   Diagnoses and all orders for this visit:  Encounter for Medicare annual wellness exam Patient will ask GYN about ordering follow up DEXA scan  Essential hypertension At goal; continue meds Monitor blood pressure at home; call if consistently over 130/80 Continue DASH diet.   Reminder to go to the ER if any CP, SOB, nausea, dizziness, severe HA, changes vision/speech, left arm numbness and tingling and jaw pain.  Migraine without aura and without status migrainosus, not intractable Improved from previous - has mild HA once a week but does not progress to full migraines; continue medications Follow up neuro - [facility]  Mixed hyperlipidemia At goal; continue medications Continue low cholesterol diet and exercise.  Check lipid panel.  -     Lipid panel -     TSH  Other abnormal glucose Last A1Cs at goal  Discussed disease and risks Discussed diet/exercise, weight management  - CMP  Allergic state, sequela Continue allergy medications as needed; rotate agent q 6 months, hygiene discussed  Depression, major, recurrent, active (HCC) with anxiety No improvement with celexa 90, resume celexa to 60 mg; xanax PRN Encouraged counseling/CBT - insurance issue -Dr. Leveda Anna informaion given  She declines med adjustment at this time  Lifestyle discussed: diet/exerise, sleep hygiene, stress management, hydration  Spinal stenosis of lumbar region Continue follow up with ortho/neurosurgery   Medication management -     CBC with Differential/Platelet -     CMP/GFR  Vitamin D deficiency continue to recommend supplementation for goal of 70-100 Check vitamin D level  Glaucoma, unspecified glaucoma type, unspecified laterality Very mild - currently under observation Continue follow up with ophthalmology  CKD (chronic kidney disease), symptom management only, stage 3 (HCC) Increase fluids, avoid NSAIDS, monitor sugars,  will monitor -     CMP WITH GFR  OAB Currently off of medications and doing well; monitor    Over 40 minutes of exam, counseling, chart review and critical decision making was performed Future Appointments  Date Time Provider Department Center  12/13/2022  1:45 PM [name], MD [facility] None  12/14/2022  2:30 PM [name], NP [facility] None  01/20/2023  9:15 AM [name], MD [facility] None  06/14/2023  2:00 PM [name], MD [facility] None     Plan:   During the course of the visit the patient was educated and counseled about appropriate screening and preventive services including:   Pneumococcal vaccine  Prevnar 13 Influenza vaccine Td vaccine Screening electrocardiogram Bone densitometry screening Colorectal cancer screening Diabetes screening Glaucoma screening Nutrition counseling  Advanced directives: requested   Subjective:  Rebekah Hamilton is a 77 y.o. female who presents for Medicare Annual Wellness Visit and 3 month follow up.   She is scheduled for posterior lumbar interbody fusion multi level T9-S2 on 12/23/2019 with Dr. Donette Larry at [facility], having more recurrent pain, has seen Dr. Otelia Sergeant recently with concern for hardware shifting, also R leg shorter, has follow up with surgeon at [facility] in May 2022. Was prescribed norco but using rarely, manages with gabapentin and also on cymbalta.   She has a diagnosis of recurrent major depression/anxiety related to her divorce which is dragging out now for several years, then husband passed away unexpectedly, having court/legal trouble with stepson. Currently treated by cymbalta 90 mg daily, PRN rare xanax. Was doing counseling but with medicare insurance had to stop. Elderly mother is living with her.   Followed  by [facility] annually for estrogen/progesterone supplementation.   BMI is There is no height or weight on file to calculate BMI., she has not been working on diet and  exercise. Wt Readings from Last 3 Encounters:  12/09/22 159 lb (72.1 kg)  12/05/22 160 lb 9.6 oz (72.8 kg)  11/08/22 159 lb 9.6 oz (72.4 kg)    Her blood pressure has been controlled at home, today their BP is   She does not workout. She denies chest pain, shortness of breath, dizziness.   She is on cholesterol medication (pravastatin 40 mg daily) and denies myalgias. Her cholesterol is at goal. The cholesterol last visit was:   Lab Results  Component Value Date   CHOL 152 09/14/2022   HDL 56 09/14/2022   LDLCALC 74 09/14/2022   TRIG 136 09/14/2022   CHOLHDL 2.7 09/14/2022    She has been working on diet and exercise for glucose management, and denies increased appetite, nausea, paresthesia of the feet, polydipsia, polyuria, visual disturbances and vomiting. Last A1C in the office was:  Lab Results  Component Value Date   HGBA1C 5.9 (H) 09/14/2022   Last GFR demonstrates labile stage 2/3 CKD, pushes water: Lab Results  Component Value Date   GFRNONAA 54 (L) 11/12/2020   GFRNONAA 49 (L) 06/04/2020   GFRNONAA 67 02/12/2020   Patient is on Vitamin D supplement and at goal of 60, taking about 10000-12000 IU daily:    Lab Results  Component Value Date   VD25OH 33 09/14/2022      Medication Review: Current Outpatient Medications on File Prior to Visit  Medication Sig Dispense Refill   ALPRAZolam (XANAX) 0.25 MG tablet Take 1/2 to 1 tablet 2 to 3 x / day ONLY if needed for Acute Anxiety Attacks  & please try to limit to 5 days / week to avoid addiction 90 tablet 0   Ascorbic Acid (VITAMIN C PO) 500 mg 2 (two) times daily.     aspirin-acetaminophen-caffeine (EXCEDRIN EXTRA STRENGTH) 250-250-65 MG tablet Take 1 tablet by mouth daily.     buPROPion (WELLBUTRIN XL) 150 MG 24 hr tablet Take 1 tablet every Morning for Mood, Focus & Concentration 90 tablet 3   Cholecalciferol (VITAMIN D PO) Take by mouth. Takes 16109 to 12000 units daily.     dexamethasone (DECADRON) 4 MG tablet Take 1  tab 3 x day - 3 days, then 2 x day - 3 days, then 1 tab daily 20 tablet 0   DULoxetine (CYMBALTA) 30 MG capsule Take  3 capsules (90 mg)  Daily  for Mood & Chronic Pain 270 capsule 3   Elastic Bandages & Supports (MEDICAL COMPRESSION STOCKINGS) MISC Knee high compression socks 20-30 pressure, measure and fit 2 each 0   estradiol (ESTRACE) 0.5 MG tablet Take  1 tablet  Daily  for Estrogen Deficiency 90 tablet 3   fexofenadine (ALLEGRA) 180 MG tablet Take 180 mg by mouth daily.     gabapentin (NEURONTIN) 300 MG capsule Take 1 capsule 3 x /day for  Pain 270 capsule 0   hydrochlorothiazide (HYDRODIURIL) 25 MG tablet Take 1/2 -1 tablet daily for BP and fluid for goal <130/80. 90 tablet 1   phentermine (ADIPEX-P) 37.5 MG tablet Take 1/2 to 1 tablet every Morning for Dieting & Weight Loss 90 tablet 1   pravastatin (PRAVACHOL) 40 MG tablet Take  1 tablet at Bedtime for Cholesterol                                /  TAKE                            BY                    MOUTH 90 tablet 3   progesterone (PROMETRIUM) 100 MG capsule Take  1 capsule  Daily 90 capsule 3   pseudoephedrine (SUDAFED) 120 MG 12 hr tablet Take  1 tablet  2 x /day (every 12 hours)  for Head and Chest Congestion 60 tablet 3   rizatriptan (MAXALT) 10 MG tablet Take  1 tablet  for Migraine & may repeat 1 x in 2 hrs -[ Maximum 2 tablets  /24 hours ] 12 tablet 3   topiramate (TOPAMAX) 50 MG tablet Take 1/2 to 1 tablet 2 x /day at Suppertime & Bedtime for Dieting & Weight Loss 180 tablet 1   No current facility-administered medications on file prior to visit.    Allergies  Allergen Reactions   Augmentin [Amoxicillin-Pot Clavulanate] Diarrhea   Doxycycline Nausea Only   Egg White (Diagnostic) Nausea Only   Prozac [Fluoxetine Hcl] Other (See Comments)    Disoriented    Current Problems (verified) Patient Active Problem List   Diagnosis Date Noted   Overweight with body mass index (BMI) of 28 to  28.9 in adult 12/05/2022   Postural dizziness with presyncope 12/05/2022   S/P lumbar fusion 11/12/2020   Chronic venous insufficiency 05/07/2019   History of 2019 novel coronavirus disease (COVID-19) 05/07/2019   Chronic left-sided low back pain with left-sided sciatica 07/02/2018   OAB (overactive bladder) 04/02/2018   CKD (chronic kidney disease), symptom management only, stage 2 (mild) 09/24/2017   Glaucoma 06/02/2015   Vitamin D deficiency 10/21/2014   Hyperlipidemia, mixed    Essential hypertension    Abnormal glucose    Migraines    Allergy    Anxiety    Depression, major, recurrent (HCC)    Lumbar stenosis     Screening Tests Immunization History  Administered Date(s) Administered   Influenza, High Dose Seasonal PF 05/29/2014, 06/02/2015, 06/16/2017, 05/09/2018, 05/09/2019   Influenza,inj,quad, With Preservative 08/12/2013, 07/04/2016   Influenza-Unspecified 05/25/2020   PFIZER(Purple Top)SARS-COV-2 Vaccination 09/30/2019, 10/21/2019, 06/22/2020, 02/06/2021   PNEUMOCOCCAL CONJUGATE-20 02/06/2021   Pneumococcal Conjugate-13 04/07/2015   Pneumococcal Polysaccharide-23 05/29/2013   Td 08/15/2005, 07/04/2016    Prior vaccinations: TD or Tdap: 2017  Influenza: 05/2020 Pneumococcal: 2014 Prevnar13: 2016 Shingles/Zostavax: 2016 Covid 19: 3/3, 2021  Preventative care: Last colonoscopy: 2006 Cologuard: 11/2017 - due, ordered  Last mammogram: 10/2019, via OBGYN, will schedule with GYN Last pap smear/pelvic exam: 2017 negative  DEXA: 2000, encouraged follow up, will schedule with GYN   MRI lumbar 01/2017, 08/2019  Names of Other Physician/Practitioners you currently use: 1. [facility] here for primary care 2. Dr. Elmer Picker, eye doctor, last visit 09/2020, planning L catarct surgery April 2022 3. Dr. Anise Salvo, dentist, last visit 2022  Patient Care Team: [name], MD as PCP - General (Internal Medicine) Valeria Batman, MD  (Inactive) as Consulting Physician (Orthopedic Surgery) Carman Ching, MD (Inactive) as Consulting Physician (Gastroenterology) Lynden Ang, NP as Nurse Practitioner (Obstetrics and Gynecology) Charlett Nose, [facility] (Inactive) as Pharmacist (Pharmacist)  SURGICAL HISTORY She  has a past surgical history that includes Cholecystectomy (1999); Breast lumpectomy (Bilateral); and Spine surgery. FAMILY HISTORY Her family history includes Heart attack in her paternal grandmother; Heart attack (age of onset: 35) in her  father; Hypertension in her mother; Other in her maternal grandmother; Stroke in her mother. SOCIAL HISTORY She  reports that she has never smoked. She has never used smokeless tobacco. She reports current alcohol use of about 2.0 standard drinks of alcohol per week. She reports that she does not use drugs.   MEDICARE WELLNESS OBJECTIVES: Physical activity:   Cardiac risk factors:   Depression/mood screen:      06/08/2022    1:23 AM  Depression screen PHQ 2/9  Decreased Interest 0  Down, Depressed, Hopeless 0  PHQ - 2 Score 0    ADLs:     06/08/2022    1:24 AM  In your present state of health, do you have any difficulty performing the following activities:  Hearing? 0  Vision? 0  Difficulty concentrating or making decisions? 0  Walking or climbing stairs? 0  Dressing or bathing? 0  Doing errands, shopping? 0     Cognitive Testing  Alert? Yes  Normal Appearance?Yes  Oriented to person? Yes  Place? Yes   Time? Yes  Recall of three objects?  Yes  Can perform simple calculations? Yes  Displays appropriate judgment?Yes  Can read the correct time from a watch face?Yes  EOL planning:    Review of Systems  Constitutional:  Negative for malaise/fatigue and weight loss.  HENT:  Negative for hearing loss and tinnitus.   Eyes:  Negative for blurred vision and double vision.  Respiratory:  Negative for cough, shortness of breath and wheezing.   Cardiovascular:   Negative for chest pain, palpitations, orthopnea, claudication and leg swelling.  Gastrointestinal:  Negative for abdominal pain, blood in stool, constipation, diarrhea, heartburn, melena, nausea and vomiting.  Genitourinary: Negative.   Musculoskeletal:  Positive for back pain (chronic lumbar). Negative for falls, joint pain and myalgias.  Skin:  Negative for rash.  Neurological:  Negative for dizziness, tingling, sensory change, weakness and headaches.  Endo/Heme/Allergies:  Negative for polydipsia.  Psychiatric/Behavioral:  Positive for depression. Negative for memory loss, substance abuse and suicidal ideas. The patient is not nervous/anxious and does not have insomnia.   All other systems reviewed and are negative.    Objective:     There were no vitals filed for this visit.  There is no height or weight on file to calculate BMI.  General appearance: alert, no distress, WD/WN, female HEENT: normocephalic, sclerae anicteric, BilTM pearly, nares patent, no discharge or erythema, pharynx normal Oral cavity: MMM, no lesions Neck: supple, no lymphadenopathy, no thyromegaly, no masses Heart: RRR, normal S1, S2, no murmurs Lungs: CTA bilaterally, no wheezes, rhonchi, or rales Abdomen: +bs, soft, non tender, non distended, no masses, no hepatomegaly, no splenomegaly Musculoskeletal: nontender, no swelling, obvious scoliosis of spine. Well healed midline incision.  Extremities: no edema, no cyanosis, no clubbing Pulses: 2+ symmetric, upper and lower extremities, normal cap refill Neurological: alert, oriented x 3, CN2-12 intact, strength normal upper extremities and lower extremities, decreased sensation LLE, no cerebellar signs, gait slow steady Psychiatric: normal affect, behavior normal, pleasant   Medicare Attestation I have personally reviewed: The patient's medical and social history Their use of alcohol, tobacco or illicit drugs Their current medications and supplements The  patient's functional ability including ADLs,fall risks, home safety risks, cognitive, and hearing and visual impairment Diet and physical activities Evidence for depression or mood disorders  The patient's weight, height, BMI, and visual acuity have been recorded in the chart.  I have made referrals, counseling, and provided education to the patient based  on review of the above and I have provided the patient with a written personalized care plan for preventive services.     [name], NP   12/13/2022

## 2022-12-14 ENCOUNTER — Ambulatory Visit (INDEPENDENT_AMBULATORY_CARE_PROVIDER_SITE_OTHER): Payer: HMO | Admitting: Nurse Practitioner

## 2022-12-14 ENCOUNTER — Encounter: Payer: Self-pay | Admitting: Nurse Practitioner

## 2022-12-14 VITALS — BP 130/80 | HR 101 | Temp 97.7°F | Ht 63.5 in | Wt 159.4 lb

## 2022-12-14 DIAGNOSIS — E663 Overweight: Secondary | ICD-10-CM

## 2022-12-14 DIAGNOSIS — E782 Mixed hyperlipidemia: Secondary | ICD-10-CM

## 2022-12-14 DIAGNOSIS — E2839 Other primary ovarian failure: Secondary | ICD-10-CM | POA: Diagnosis not present

## 2022-12-14 DIAGNOSIS — F331 Major depressive disorder, recurrent, moderate: Secondary | ICD-10-CM

## 2022-12-14 DIAGNOSIS — H409 Unspecified glaucoma: Secondary | ICD-10-CM

## 2022-12-14 DIAGNOSIS — R0989 Other specified symptoms and signs involving the circulatory and respiratory systems: Secondary | ICD-10-CM

## 2022-12-14 DIAGNOSIS — G43009 Migraine without aura, not intractable, without status migrainosus: Secondary | ICD-10-CM

## 2022-12-14 DIAGNOSIS — M48062 Spinal stenosis, lumbar region with neurogenic claudication: Secondary | ICD-10-CM

## 2022-12-14 DIAGNOSIS — Z79899 Other long term (current) drug therapy: Secondary | ICD-10-CM | POA: Diagnosis not present

## 2022-12-14 DIAGNOSIS — I10 Essential (primary) hypertension: Secondary | ICD-10-CM | POA: Diagnosis not present

## 2022-12-14 DIAGNOSIS — Z0000 Encounter for general adult medical examination without abnormal findings: Secondary | ICD-10-CM

## 2022-12-14 DIAGNOSIS — E559 Vitamin D deficiency, unspecified: Secondary | ICD-10-CM

## 2022-12-14 DIAGNOSIS — R7309 Other abnormal glucose: Secondary | ICD-10-CM

## 2022-12-14 DIAGNOSIS — Z6827 Body mass index (BMI) 27.0-27.9, adult: Secondary | ICD-10-CM

## 2022-12-14 DIAGNOSIS — R6889 Other general symptoms and signs: Secondary | ICD-10-CM | POA: Diagnosis not present

## 2022-12-14 DIAGNOSIS — Z0001 Encounter for general adult medical examination with abnormal findings: Secondary | ICD-10-CM

## 2022-12-14 DIAGNOSIS — N1831 Chronic kidney disease, stage 3a: Secondary | ICD-10-CM

## 2022-12-14 DIAGNOSIS — N3281 Overactive bladder: Secondary | ICD-10-CM | POA: Diagnosis not present

## 2022-12-14 DIAGNOSIS — S0181XD Laceration without foreign body of other part of head, subsequent encounter: Secondary | ICD-10-CM

## 2022-12-14 DIAGNOSIS — T7840XS Allergy, unspecified, sequela: Secondary | ICD-10-CM

## 2022-12-14 NOTE — Patient Instructions (Signed)
Only take Hydrochlorothiazide once in the Am if BP > 130/80  Do not take topiramate for weight loss currently until other neuro symptoms are evaluated.  Take the Phentermine once a day in am for weight loss  Fair life protein shakes Eat more frequently - try not to go more than 6 hours without protein Aim for 90 grams of protein a day- 30 breakfast/30 lunch 30 dinner Try to keep net carbs less than 50 Net Carbs=Total Carbs-fiber- sugar alcohols Exercise heartrate 120-140(fat burning zone)- walking 20-30 minutes 4 days a week   Hypertension, Adult Hypertension is another name for high blood pressure. High blood pressure forces your heart to work harder to pump blood. This can cause problems over time. There are two numbers in a blood pressure reading. There is a top number (systolic) over a bottom number (diastolic). It is best to have a blood pressure that is below 120/80. What are the causes? The cause of this condition is not known. Some other conditions can lead to high blood pressure. What increases the risk? Some lifestyle factors can make you more likely to develop high blood pressure: Smoking. Not getting enough exercise or physical activity. Being overweight. Having too much fat, sugar, calories, or salt (sodium) in your diet. Drinking too much alcohol. Other risk factors include: Having any of these conditions: Heart disease. Diabetes. High cholesterol. Kidney disease. Obstructive sleep apnea. Having a family history of high blood pressure and high cholesterol. Age. The risk increases with age. Stress. What are the signs or symptoms? High blood pressure may not cause symptoms. Very high blood pressure (hypertensive crisis) may cause: Headache. Fast or uneven heartbeats (palpitations). Shortness of breath. Nosebleed. Vomiting or feeling like you may vomit (nauseous). Changes in how you see. Very bad chest pain. Feeling dizzy. Seizures. How is this treated? This  condition is treated by making healthy lifestyle changes, such as: Eating healthy foods. Exercising more. Drinking less alcohol. Your doctor may prescribe medicine if lifestyle changes do not help enough and if: Your top number is above 130. Your bottom number is above 80. Your personal target blood pressure may vary. Follow these instructions at home: Eating and drinking  If told, follow the DASH eating plan. To follow this plan: Fill one half of your plate at each meal with fruits and vegetables. Fill one fourth of your plate at each meal with whole grains. Whole grains include whole-wheat pasta, brown rice, and whole-grain bread. Eat or drink low-fat dairy products, such as skim milk or low-fat yogurt. Fill one fourth of your plate at each meal with low-fat (lean) proteins. Low-fat proteins include fish, chicken without skin, eggs, beans, and tofu. Avoid fatty meat, cured and processed meat, or chicken with skin. Avoid pre-made or processed food. Limit the amount of salt in your diet to less than 1,500 mg each day. Do not drink alcohol if: Your doctor tells you not to drink. You are pregnant, may be pregnant, or are planning to become pregnant. If you drink alcohol: Limit how much you have to: 0-1 drink a day for women. 0-2 drinks a day for men. Know how much alcohol is in your drink. In the [place] one drink equals one 12 oz bottle of beer (355 mL), one 5 oz glass of wine (148 mL), or one 1 oz glass of hard liquor (44 mL). Lifestyle  Work with your doctor to stay at a healthy weight or to lose weight. Ask your doctor what the best weight is for  you. Get at least 30 minutes of exercise that causes your heart to beat faster (aerobic exercise) most days of the week. This may include walking, swimming, or biking. Get at least 30 minutes of exercise that strengthens your muscles (resistance exercise) at least 3 days a week. This may include lifting weights or doing Pilates. Do not smoke  or use any products that contain nicotine or tobacco. If you need help quitting, ask your doctor. Check your blood pressure at home as told by your doctor. Keep all follow-up visits. Medicines Take over-the-counter and prescription medicines only as told by your doctor. Follow directions carefully. Do not skip doses of blood pressure medicine. The medicine does not work as well if you skip doses. Skipping doses also puts you at risk for problems. Ask your doctor about side effects or reactions to medicines that you should watch for. Contact a doctor if: You think you are having a reaction to the medicine you are taking. You have headaches that keep coming back. You feel dizzy. You have swelling in your ankles. You have trouble with your vision. Get help right away if: You get a very bad headache. You start to feel mixed up (confused). You feel weak or numb. You feel faint. You have very bad pain in your: Chest. Belly (abdomen). You vomit more than once. You have trouble breathing. These symptoms may be an emergency. Get help right away. Call 911. Do not wait to see if the symptoms will go away. Do not drive yourself to the hospital. Summary Hypertension is another name for high blood pressure. High blood pressure forces your heart to work harder to pump blood. For most people, a normal blood pressure is less than 120/80. Making healthy choices can help lower blood pressure. If your blood pressure does not get lower with healthy choices, you may need to take medicine. This information is not intended to replace advice given to you by your health care provider. Make sure you discuss any questions you have with your health care provider. Document Revised: 05/20/2021 Document Reviewed: 05/20/2021 Elsevier Patient Education  2023 [employer].

## 2022-12-15 LAB — CBC WITH DIFFERENTIAL/PLATELET
Absolute Monocytes: 859 cells/uL (ref 200–950)
Basophils Absolute: 61 cells/uL (ref 0–200)
Basophils Relative: 0.8 %
Eosinophils Absolute: 61 cells/uL (ref 15–500)
Eosinophils Relative: 0.8 %
HCT: 39.5 % (ref 35.0–45.0)
Hemoglobin: 13.3 g/dL (ref 11.7–15.5)
Lymphs Abs: 2326 cells/uL (ref 850–3900)
MCH: 31.1 pg (ref 27.0–33.0)
MCHC: 33.7 g/dL (ref 32.0–36.0)
MCV: 92.5 fL (ref 80.0–100.0)
MPV: 9.7 fL (ref 7.5–12.5)
Monocytes Relative: 11.3 %
Neutro Abs: 4294 cells/uL (ref 1500–7800)
Neutrophils Relative %: 56.5 %
Platelets: 351 10*3/uL (ref 140–400)
RBC: 4.27 10*6/uL (ref 3.80–5.10)
RDW: 13 % (ref 11.0–15.0)
Total Lymphocyte: 30.6 %
WBC: 7.6 10*3/uL (ref 3.8–10.8)

## 2022-12-15 LAB — LIPID PANEL
Cholesterol: 189 mg/dL (ref <200)
HDL: 50 mg/dL (ref >=50)
LDL Cholesterol (Calc): 106 mg/dL (calc) — ABNORMAL HIGH
Non-HDL Cholesterol (Calc): 139 mg/dL (calc) — ABNORMAL HIGH (ref <130)
Total CHOL/HDL Ratio: 3.8 (calc) (ref <5.0)
Triglycerides: 210 mg/dL — ABNORMAL HIGH (ref <150)

## 2022-12-15 LAB — COMPLETE METABOLIC PANEL WITH GFR
AG Ratio: 2 (calc) (ref 1.0–2.5)
ALT: 23 U/L (ref 6–29)
AST: 21 U/L (ref 10–35)
Albumin: 4.3 g/dL (ref 3.6–5.1)
Alkaline phosphatase (APISO): 59 U/L (ref 37–153)
BUN/Creatinine Ratio: 15 (calc) (ref 6–22)
BUN: 18 mg/dL (ref 7–25)
CO2: 24 mmol/L (ref 20–32)
Calcium: 10.1 mg/dL (ref 8.6–10.4)
Chloride: 108 mmol/L (ref 98–110)
Creat: 1.19 mg/dL — ABNORMAL HIGH (ref 0.60–1.00)
Globulin: 2.1 g/dL (calc) (ref 1.9–3.7)
Glucose, Bld: 97 mg/dL (ref 65–99)
Potassium: 4 mmol/L (ref 3.5–5.3)
Sodium: 143 mmol/L (ref 135–146)
Total Bilirubin: 0.4 mg/dL (ref 0.2–1.2)
Total Protein: 6.4 g/dL (ref 6.1–8.1)
eGFR: 48 mL/min/{1.73_m2} — ABNORMAL LOW (ref >=60)

## 2022-12-15 LAB — TSH: TSH: 2.46 mIU/L (ref 0.40–4.50)

## 2023-01-20 ENCOUNTER — Ambulatory Visit (INDEPENDENT_AMBULATORY_CARE_PROVIDER_SITE_OTHER): Payer: HMO | Admitting: Orthopedic Surgery

## 2023-01-20 DIAGNOSIS — M5412 Radiculopathy, cervical region: Secondary | ICD-10-CM

## 2023-01-20 NOTE — Progress Notes (Signed)
Orthopedic Spine Surgery Office Note   Assessment: Patient is a 77 y.o. female with neck pain that radiates into the bilateral shoulders and arms.  Has multiple levels of cervical foraminal stenosis     Plan: -Patient has tried tylenol, ibuprofen, cervical steroid injection, medrol dosepak  -I went over a number of nonoperative treatments that she could try and discussed C4-7 ACDF as a treatment option since she has had persistent symptoms in spite of conservative treatment -Patient wanted to proceed with a cervical epidural steroid injection -Patient should return to office in 6 weeks, x-rays at next visit: none     Patient expressed understanding of the plan and all questions were answered to the patient's satisfaction.    __________________________________________________________________________   History: Patient is a 77 y.o. female who has been previously seen in the office for symptoms consistent with cervical radiculopathy.  Patient had been having pain that radiated from her neck into her right shoulder and right lateral arm.  That pain has resolved, but she has now developed pain that radiates into the left shoulder and left lateral arm.  It goes to the level of the elbow.  It does not radiate past the elbow.  She notices paresthesias in the same distribution as the pain.  She has not noticed any numbness.  She states that her pain gets better if she raises her arm up above the level of her shoulder.  There is no trauma or injury that preceded the onset of this left-sided pain.     Previous treatments:  tylenol, ibuprofen, cervical steroid injection, medrol dosepak      Physical Exam:     General: no acute distress, appears stated age Neurologic: alert, answering questions appropriately, following commands Respiratory: unlabored breathing on room air, symmetric chest rise Psychiatric: appropriate affect, normal cadence to speech     MSK (spine):   -Strength exam                                                    Left                  Right Grip strength                5/5                  5/5 Interosseus                  5/5                  5/5 Wrist extension            5/5                  5/5 Wrist flexion                 5/5                  5/5 Elbow flexion                5/5                  5/5 Deltoid                          5/5  5/5   EHL                              5/5                  5/5 TA                                 5/5                  5/5 GSC                             5/5                  5/5 Knee extension            5/5                  5/5 Hip flexion                    5/5                  5/5   -Sensory exam                           Sensation intact to light touch in L3-S1 nerve distributions of bilateral lower extremities             Sensation intact to light touch in C5-T1 nerve distributions of bilateral upper extremities   -Brachioradialis DTR: 1/4 on the left, 1/4 on the right -Biceps DTR: 1/4 on the left, 1/4 on the right -Achilles DTR: 1/4 on the left, 1/4 on the right -Patellar tendon DTR: 1/4 on the left, 1/4 on the right   -Spurling: negative bilaterally -Hoffman sign: negative bilaterally -Clonus: no beats bilaterally -Interosseous wasting: none seen -Grip and release test: negative -Romberg: negative -Gait: normal -Imbalance with tandem gait: no   Left shoulder exam: no pain through range of motion, negative jobe, no weakness with external rotation with arm at side, negative belly press Right shoulder exam: no pain through range of motion, mild pain jobe but no weakness, no weakness with external rotation with arm at side, negative belly press     Imaging: XR of the cervical spine from 10/28/2022 was previously independently reviewed and interpreted, showing disc height loss at C4/5, C5/6, C6/7.  Anterior osteophyte formation at those disc bases as well.  No evidence of instability on  flexion/extension views.  No fracture or dislocation seen.   MRI of the cervical spine from 06/03/2022 was previously independently reviewed and interpreted, showing central and bilateral foraminal stenosis at C4/5, central and bilateral foraminal stenosis at C5/6, right paracentral disc herniation at C6/7 with central stenosis at that level as well. Left sided foraminal stenosis at C6/7. No T2 cord signal change.        Patient name: Rebekah Hamilton Patient MRN: [id number] Date of visit: 01/20/23

## 2023-01-30 ENCOUNTER — Other Ambulatory Visit: Payer: Self-pay | Admitting: Nurse Practitioner

## 2023-01-30 ENCOUNTER — Telehealth: Payer: Self-pay | Admitting: Physical Medicine and Rehabilitation

## 2023-01-30 ENCOUNTER — Other Ambulatory Visit: Payer: Self-pay

## 2023-01-30 DIAGNOSIS — G43009 Migraine without aura, not intractable, without status migrainosus: Secondary | ICD-10-CM

## 2023-01-30 MED ORDER — PRAVASTATIN SODIUM 40 MG PO TABS: ORAL_TABLET | ORAL | 3 refills | Status: DC

## 2023-01-30 NOTE — Telephone Encounter (Signed)
Patient called to schedule an appointment with Dr. Alvester Morin. The number to contact patient is [phone]

## 2023-01-31 NOTE — Telephone Encounter (Signed)
LVM to return call to schedule injection 

## 2023-02-10 ENCOUNTER — Other Ambulatory Visit: Payer: Self-pay | Admitting: Nurse Practitioner

## 2023-02-10 DIAGNOSIS — F331 Major depressive disorder, recurrent, moderate: Secondary | ICD-10-CM

## 2023-02-27 ENCOUNTER — Ambulatory Visit (INDEPENDENT_AMBULATORY_CARE_PROVIDER_SITE_OTHER): Payer: HMO | Admitting: Physical Medicine and Rehabilitation

## 2023-02-27 ENCOUNTER — Other Ambulatory Visit: Payer: Self-pay

## 2023-02-27 VITALS — BP 140/81 | HR 71 | Ht 64.0 in | Wt 140.0 lb

## 2023-02-27 DIAGNOSIS — M5412 Radiculopathy, cervical region: Secondary | ICD-10-CM | POA: Diagnosis not present

## 2023-02-27 MED ORDER — METHYLPREDNISOLONE ACETATE 80 MG/ML IJ SUSP
80.0000 mg | Freq: Once | INTRAMUSCULAR | Status: AC
  Administered 2023-02-27: 80 mg

## 2023-02-27 NOTE — Patient Instructions (Signed)

## 2023-02-27 NOTE — Progress Notes (Signed)
Patient presents with left sided neck pain that radiates down the left arm to just above the elbow. She has some tingling in the arm that radiates to the fingers. She has a history of full fusion of the spine and states that she has had trouble since then. She is not taking anything for pain.   Functional Pain Scale - descriptive words and definitions  Distracting (5)    Aware of pain/able to complete some ADL's but limited by pain/sleep is affected and active distractions are only slightly useful. Moderate range order  Average Pain 5   +Driver, -BT, -Dye Allergies.

## 2023-03-03 ENCOUNTER — Ambulatory Visit (INDEPENDENT_AMBULATORY_CARE_PROVIDER_SITE_OTHER): Payer: HMO | Admitting: Orthopedic Surgery

## 2023-03-03 DIAGNOSIS — M5412 Radiculopathy, cervical region: Secondary | ICD-10-CM

## 2023-03-03 NOTE — Progress Notes (Signed)
Orthopedic Spine Surgery Office Note   Assessment: Patient is a 77 y.o. female with neck pain that radiates into the bilateral shoulders and arms.  Has multiple levels of cervical foraminal stenosis. Got significant improvement with cervical ESI     Plan: -Patient has tried tylenol, ibuprofen, cervical steroid injection, medrol dosepak  -Since she got 75% relief with the injection and her pain is tolerable, I told her she can get repeat injections for her pain as long as they last -I told her if injections stop providing her with adequate relief, C4-7 ACDF would be a treatment option for her -Patient should return to office on an as needed basis     Patient expressed understanding of the plan and all questions were answered to the patient's satisfaction.    __________________________________________________________________________   History: Patient is a 77 y.o. female who has been previously seen in the office for symptoms consistent with cervical radiculopathy. After our last visit, she got a cervical ESI with Dr. Alvester Morin. She has noted complete relief of her neck pain and about 75% relief of her radiating arm pain with the injection. She is satisfied with the result thus far. She has been able to do more since she is in less pain.    Previous treatments:  tylenol, ibuprofen, cervical steroid injection, medrol dosepak      Physical Exam:     General: no acute distress, appears stated age Neurologic: alert, answering questions appropriately, following commands Respiratory: unlabored breathing on room air, symmetric chest rise Psychiatric: appropriate affect, normal cadence to speech     MSK (spine):   -Strength exam                                                   Left                  Right Grip strength                5/5                  5/5 Interosseus                  5/5                  5/5 Wrist extension            5/5                  5/5 Wrist flexion                  5/5                  5/5 Elbow flexion                5/5                  5/5 Deltoid                          5/5                  5/5   EHL  5/5                  5/5 TA                                 5/5                  5/5 GSC                             5/5                  5/5 Knee extension            5/5                  5/5 Hip flexion                    5/5                  5/5   -Sensory exam                           Sensation intact to light touch in L3-S1 nerve distributions of bilateral lower extremities             Sensation intact to light touch in C5-T1 nerve distributions of bilateral upper extremities   -Brachioradialis DTR: 1/4 on the left, 1/4 on the right -Biceps DTR: 1/4 on the left, 1/4 on the right -Achilles DTR: 1/4 on the left, 1/4 on the right -Patellar tendon DTR: 1/4 on the left, 1/4 on the right   -Spurling: negative bilaterally -Hoffman sign: negative bilaterally -Clonus: no beats bilaterally -Interosseous wasting: none seen -Grip and release test: negative -Romberg: negative -Gait: normal -Imbalance with tandem gait: no     Imaging: XR of the cervical spine from 10/28/2022 was previously independently reviewed and interpreted, showing disc height loss at C4/5, C5/6, C6/7.  Anterior osteophyte formation at those disc bases as well.  No evidence of instability on flexion/extension views.  No fracture or dislocation seen.   MRI of the cervical spine from 06/03/2022 was previously independently reviewed and interpreted, showing central and bilateral foraminal stenosis at C4/5, central and bilateral foraminal stenosis at C5/6, right paracentral disc herniation at C6/7 with central stenosis at that level as well. Left sided foraminal stenosis at C6/7. No T2 cord signal change.        Patient name: Rebekah Hamilton Patient MRN: [id number] Date of visit: 03/03/23

## 2023-03-10 NOTE — Progress Notes (Signed)
Rebekah Hamilton [place] - 77 y.o. female MRN [id number]  Date of birth: 06-15-1946  Office Visit Note: Visit Date: 02/27/2023 PCP: [name], MD Referred by: [name], MD  Subjective: Chief Complaint  Patient presents with   Neck - Pain   Left Upper Arm - Pain   HPI:  Rebekah Hamilton is a 77 y.o. female who comes in today at the request of Dr. Willia Craze for planned Left C7-T1 Cervical Interlaminar epidural steroid injection with fluoroscopic guidance.  The patient has failed conservative care including home exercise, medications, time and activity modification.  This injection will be diagnostic and hopefully therapeutic.  Please see requesting physician notes for further details and justification.   ROS Otherwise per HPI.  Assessment & Plan: Visit Diagnoses:    ICD-10-CM   1. Cervical radiculopathy  M54.12 XR C-ARM NO REPORT    Epidural Steroid injection    methylPREDNISolone acetate (DEPO-MEDROL) injection 80 mg      Plan: No additional findings.   Meds & Orders:  Meds ordered this encounter  Medications   methylPREDNISolone acetate (DEPO-MEDROL) injection 80 mg    Orders Placed This Encounter  Procedures   XR C-ARM NO REPORT   Epidural Steroid injection    Follow-up: Return for visit to requesting provider as needed.   Procedures: No procedures performed      Clinical History: CLINICAL DATA:  Initial evaluation for right-sided radicular pain, limited range of motion.   EXAM: MRI CERVICAL SPINE WITHOUT CONTRAST   TECHNIQUE: Multiplanar, multisequence MR imaging of the cervical spine was performed. No intravenous contrast was administered.   COMPARISON:  Prior radiograph from 05/26/2022.   FINDINGS: Alignment: Straightening with mild reversal of the normal cervical lordosis. Trace stepwise degenerative retrolisthesis of C4 on C5, C5 on C6, and C6 on C7, with trace anterolisthesis of C7 on T1, T1 on T2, and T2 on T3. Findings  likely chronic and facet mediated.   Vertebrae: Vertebral body height maintained without acute or chronic fracture. Bone marrow signal intensity within normal limits. 1 cm stir hyperintense lesion within the C3 vertebral body likely reflects an atypical hemangioma. No other discrete or worrisome osseous lesions. Mild discogenic reactive endplate changes present at C4-5 through C6-7. No other abnormal marrow edema.   Cord: Normal signal and morphology.   Posterior Fossa, vertebral arteries, paraspinal tissues: Chronic microvascular ischemic changes noted within the pons. Visualized brain and posterior fossa otherwise unremarkable. Craniocervical junction normal. Paraspinous soft tissues within normal limits. Normal flow voids seen within the vertebral arteries bilaterally. 1 cm right thyroid nodule noted, doubtful significance given size and patient age, no follow-up imaging recommended (ref: J Am Coll Radiol. 2015 Feb;12(2): 143-50).   Disc levels:   C2-C3: Left-sided uncovertebral spurring without significant disc bulge. Mild left-sided facet hypertrophy. No spinal stenosis. Mild left C3 foraminal narrowing. Right neural foramen remains patent.   C3-C4: Mild right eccentric disc bulge with bilateral uncovertebral spurring. Moderate right with mild left facet hypertrophy. No spinal stenosis. Moderate right with mild left C4 foraminal stenosis.   C4-C5: Degenerative intervertebral disc space narrowing with diffuse disc osteophyte complex. Broad posterior component effaces the ventral thecal sac. Superimposed mild facet and ligament flavum hypertrophy. Resultant moderate spinal stenosis with minimal cord flattening, but no cord signal changes. Severe bilateral C5 foraminal stenosis.   C5-C6: Degenerative vertebral disc space narrowing with diffuse disc osteophyte complex. Broad posterior component flattens and partially effaces the ventral thecal sac, slightly asymmetric to the  right. Secondary cord flattening without cord signal changes. Moderate to severe spinal stenosis. Severe bilateral C6 foraminal narrowing.   C6-C7: Degenerative intervertebral disc space narrowing with diffuse disc osteophyte complex. Superimposed right paracentral disc protrusion indents the right ventral thecal sac (series 8, image 20). Secondary cord flattening without cord signal changes. Moderate spinal stenosis. Severe left with mild right C7 foraminal stenosis.   C7-T1: Mild disc bulge with uncovertebral spurring. Moderate left with mild right facet hypertrophy. No spinal stenosis. Foramina remain patent.   IMPRESSION: 1. Multilevel cervical spondylosis with resultant moderate to severe spinal stenosis at C4-5 through C6-7. Particular note made of a superimposed right paracentral disc protrusion at C6-7, which could contribute to right-sided radicular symptoms. 2. Multifactorial degenerative changes with resultant multilevel foraminal narrowing as above. Notable findings include moderate right C4 foraminal stenosis, severe bilateral C5 and C6 foraminal narrowing, with severe left C7 foraminal stenosis.     Electronically Signed   By: Rise Mu M.D.   On: 06/05/2022 05:13     Objective:  VS:  HT:5\' 4"  (162.6 cm)   WT:140 lb (63.5 kg)  BMI:24.02    BP:(!) 140/81  HR:71bpm  TEMP: ( )  RESP:  Physical Exam Vitals and nursing note reviewed.  Constitutional:      General: She is not in acute distress.    Appearance: Normal appearance. She is not ill-appearing.  HENT:     Head: Normocephalic and atraumatic.     Right Ear: External ear normal.     Left Ear: External ear normal.  Eyes:     Extraocular Movements: Extraocular movements intact.  Cardiovascular:     Rate and Rhythm: Normal rate.     Pulses: Normal pulses.  Musculoskeletal:     Cervical back: Tenderness present. No rigidity.     Right lower leg: No edema.     Left lower leg: No edema.      Comments: Patient has good strength in the upper extremities including 5 out of 5 strength in wrist extension long finger flexion and APB.  There is no atrophy of the hands intrinsically.  There is a negative Hoffmann's test.   Lymphadenopathy:     Cervical: No cervical adenopathy.  Skin:    Findings: No erythema, lesion or rash.  Neurological:     General: No focal deficit present.     Mental Status: She is alert and oriented to person, place, and time.     Sensory: No sensory deficit.     Motor: No weakness or abnormal muscle tone.     Coordination: Coordination normal.  Psychiatric:        Mood and Affect: Mood normal.        Behavior: Behavior normal.      Imaging: No results found.

## 2023-03-21 DIAGNOSIS — D0439 Carcinoma in situ of skin of other parts of face: Secondary | ICD-10-CM | POA: Diagnosis not present

## 2023-03-21 DIAGNOSIS — Z85828 Personal history of other malignant neoplasm of skin: Secondary | ICD-10-CM | POA: Diagnosis not present

## 2023-03-21 DIAGNOSIS — Z08 Encounter for follow-up examination after completed treatment for malignant neoplasm: Secondary | ICD-10-CM | POA: Diagnosis not present

## 2023-03-22 ENCOUNTER — Ambulatory Visit: Payer: HMO | Admitting: Nurse Practitioner

## 2023-03-23 ENCOUNTER — Ambulatory Visit: Payer: HMO | Admitting: Nurse Practitioner

## 2023-04-04 NOTE — Progress Notes (Deleted)
Assessment and Plan:  There are no diagnoses linked to this encounter.    Further disposition pending results of labs. Discussed med's effects and SE's.   Over 30 minutes of exam, counseling, chart review, and critical decision making was performed.   Future Appointments  Date Time Provider Department Center  04/05/2023  2:30 PM [name], NP [facility] None  06/28/2023  2:00 PM [name], NP [facility] None  07/05/2023 11:30 AM [facility] DX DEXA 1 [facility]    ------------------------------------------------------------------------------------------------------------------   HPI There were no vitals taken for this visit. 77 y.o.female presents for  Past Medical History:  Diagnosis Date   Allergy    Anemia    Anxiety    Depression    Hyperlipidemia    Hypertension    Lumbar stenosis    Migraines    Prediabetes      Allergies  Allergen Reactions   Augmentin [Amoxicillin-Pot Clavulanate] Diarrhea   Doxycycline Nausea Only   Egg White (Diagnostic) Nausea Only   Prozac [Fluoxetine Hcl] Other (See Comments)    Disoriented    Current Outpatient Medications on File Prior to Visit  Medication Sig   ALPRAZolam (XANAX) 0.25 MG tablet Take 1/2 to 1 tablet 2 to 3 x / day ONLY if needed for Acute Anxiety Attacks  & please try to limit to 5 days / week to avoid addiction   Ascorbic Acid (VITAMIN C PO) 500 mg 2 (two) times daily.   aspirin-acetaminophen-caffeine (EXCEDRIN EXTRA STRENGTH) 250-250-65 MG tablet Take 1 tablet by mouth daily.   buPROPion (WELLBUTRIN XL) 150 MG 24 hr tablet Take 1 tablet every Morning for Mood, Focus & Concentration   Cholecalciferol (VITAMIN D PO) Take by mouth. Takes 32440 to 12000 units daily.   DULoxetine (CYMBALTA) 30 MG capsule TAKE 3 CAPSULES BY MOUTH DAILY FOR MOOD AND CHRONIC PAIN   Elastic Bandages & Supports (MEDICAL COMPRESSION STOCKINGS) MISC Knee high compression socks 20-30 pressure, measure and fit   estradiol  (ESTRACE) 0.5 MG tablet Take  1 tablet  Daily  for Estrogen Deficiency   fexofenadine (ALLEGRA) 180 MG tablet Take 180 mg by mouth daily.   gabapentin (NEURONTIN) 300 MG capsule Take 1 capsule 3 x /day for  Pain (Patient not taking: Reported on 12/14/2022)   hydrochlorothiazide (HYDRODIURIL) 25 MG tablet Take 1/2 -1 tablet daily for BP and fluid for goal <130/80. (Patient not taking: Reported on 12/14/2022)   phentermine (ADIPEX-P) 37.5 MG tablet Take 1/2 to 1 tablet every Morning for Dieting & Weight Loss   pravastatin (PRAVACHOL) 40 MG tablet Take one tablet at bedtime   progesterone (PROMETRIUM) 100 MG capsule Take  1 capsule  Daily   pseudoephedrine (SUDAFED) 120 MG 12 hr tablet Take  1 tablet  2 x /day (every 12 hours)  for Head and Chest Congestion (Patient not taking: Reported on 12/14/2022)   rizatriptan (MAXALT) 10 MG tablet TAKE 1 TABLET BY MOUTH FOR MIGRAINE. MAY REPEAT 1 IN 2 HRS(MAX OF 2 TABLETS IN 24 HOURS)   topiramate (TOPAMAX) 50 MG tablet Take 1/2 to 1 tablet 2 x /day at Suppertime & Bedtime for Dieting & Weight Loss   No current facility-administered medications on file prior to visit.    ROS: all negative except above.   Physical Exam:  There were no vitals taken for this visit.  General Appearance: Well nourished, in no apparent distress. Eyes: PERRLA, EOMs, conjunctiva no swelling or erythema Sinuses: No Frontal/maxillary tenderness ENT/Mouth: Ext aud canals  clear, TMs without erythema, bulging. No erythema, swelling, or exudate on post pharynx.  Tonsils not swollen or erythematous. Hearing normal.  Neck: Supple, thyroid normal.  Respiratory: Respiratory effort normal, BS equal bilaterally without rales, rhonchi, wheezing or stridor.  Cardio: RRR with no MRGs. Brisk peripheral pulses without edema.  Abdomen: Soft, + BS.  Non tender, no guarding, rebound, hernias, masses. Lymphatics: Non tender without lymphadenopathy.  Musculoskeletal: Full ROM, 5/5 strength, normal gait.   Skin: Warm, dry without rashes, lesions, ecchymosis.  Neuro: Cranial nerves intact. Normal muscle tone, no cerebellar symptoms. Sensation intact.  Psych: Awake and oriented X 3, normal affect, Insight and Judgment appropriate.     [name], NP 12:45 PM [facility]oles[facility]

## 2023-04-05 ENCOUNTER — Ambulatory Visit: Payer: HMO | Admitting: Nurse Practitioner

## 2023-04-05 ENCOUNTER — Encounter: Payer: Self-pay | Admitting: Nurse Practitioner

## 2023-04-05 VITALS — BP 122/74 | HR 77 | Temp 97.5°F | Ht 63.5 in | Wt 160.2 lb

## 2023-04-05 DIAGNOSIS — F419 Anxiety disorder, unspecified: Secondary | ICD-10-CM | POA: Diagnosis not present

## 2023-04-05 DIAGNOSIS — E782 Mixed hyperlipidemia: Secondary | ICD-10-CM | POA: Diagnosis not present

## 2023-04-05 DIAGNOSIS — R5383 Other fatigue: Secondary | ICD-10-CM | POA: Diagnosis not present

## 2023-04-05 DIAGNOSIS — Z981 Arthrodesis status: Secondary | ICD-10-CM | POA: Diagnosis not present

## 2023-04-05 DIAGNOSIS — G43009 Migraine without aura, not intractable, without status migrainosus: Secondary | ICD-10-CM | POA: Diagnosis not present

## 2023-04-05 DIAGNOSIS — F331 Major depressive disorder, recurrent, moderate: Secondary | ICD-10-CM

## 2023-04-05 DIAGNOSIS — G8929 Other chronic pain: Secondary | ICD-10-CM | POA: Diagnosis not present

## 2023-04-05 DIAGNOSIS — M5442 Lumbago with sciatica, left side: Secondary | ICD-10-CM

## 2023-04-05 DIAGNOSIS — R413 Other amnesia: Secondary | ICD-10-CM

## 2023-04-05 NOTE — Progress Notes (Signed)
Follow Up  Rebekah Hamilton was seen today for an episodic visit.  Below references diagnostics and current plan of care:  Assessment and Plan:  Memory loss Mini Mental Fail - unable to recall word list; clock draw normal Discussed that failing Mini Mental does not diagnose dementia.  - CT HEAD WO CONTRAST ( ); Future - Ambulatory referral to Neurology  Migraine without aura and without status migrainosus, not intractable Continue Maxalt as directed Stay well hydrated. Avoid triggers Discussed benefit of magnesium supplement Continue to monitor  - CT HEAD WO CONTRAST ( ); Future - Ambulatory referral to Neurology  Moderate episode of recurrent major depressive disorder (HCC)/Anxiety Patient to reach out to therapist. Psychological Services Provided on AVS.  Hyperlipidemia, mixed Continue Pravastatin Discussed lifestyle modifications. Recommended diet heavy in fruits and veggies, omega 3's. Decrease consumption of animal meats, cheeses, and dairy products. Remain active and exercise as tolerated. Continue to monitor.  S/P lumbar fusion/Chronic left-sided low back pain with left-sided sciatica Continue to follow Dr. Wyline Mood as directed Continue steroid injections PRN  Other fatigue Possible SE of underlying neurological condition. Continue referral to Neurology Continue to monitor  Notify office for further evaluation and treatment, questions or concerns if any reported s/s fail to improve.   The patient was advised to call back or seek an in-person evaluation if any symptoms worsen or if the condition fails to improve as anticipated.   Further disposition pending results of labs. Discussed med's effects and SE's.    I discussed the assessment and treatment plan with the patient. The patient was provided an opportunity to ask questions and all were answered. The patient agreed with the plan and demonstrated an understanding of the instructions.  Discussed med's  effects and SE's. Screening labs and tests as requested with regular follow-up as recommended.  I provided 30 minutes of face-to-face time during this encounter including counseling, chart review, and critical decision making was preformed.  Today's Plan of Care is based on a patient-centered health care approach known as shared decision making - the decisions, tests and treatments allow for patient preferences and values to be balanced with clinical evidence.     Future Appointments  Date Time Provider Department Center  06/28/2023  2:00 PM [name], NP [facility] None  07/05/2023 11:30 AM [facility] DX DEXA 1 [facility]    ------------------------------------------------------------------------------------------------------------------   HPI BP 122/74   Pulse 77   Temp (!) 97.5 F (36.4 C)   Ht 5' 3.5" (1.613 m)   Wt 160 lb 3.2 oz (72.7 kg)   SpO2 96%   BMI 27.93 kg/m   77 y.o.female presents accompanied by her son for evaluation of what she feels is worsening memory loss.  States that she had PLIF L3-4 surgery with Dr. Wyline Mood approximately 6 months ago where she was informed that she was under anesthesia longer than normal.  She had to attend rehabilitation afterwards but has been unable to recall any of the events that took place during the month of her rehabilitation.  She had to have another surgery after that which she feels may have exacerbated her memory loss although she is able to recall the events that took place after.  She continues to have back pain and is receiving spinal injections, last approximately 6-8 weeks ago.  Son feels a mother/patient is overwhelmed by stress and anxiety.  She has had several traumatic events occur over the last several years.  She has stopped most of her medications for treatment  of anxiety and depression including Wellbutrin, Cymbalta, Neurontin, hydrochlorothiazide Phentermine and Topiramate as she felt as though this could be  contributing to her memory loss.  She does continue to take Alprazolam once a week but tries to limit to only PRN.  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  Sons feels as though her committment to them has increase the anxiety and depression.  Patient also shares that at times when she is driving around she will forget where she is going.    She admits to sleeping long hours.  She may lay down between 1800-2100 and not awake until 1000-1200 the next day   Of note she does have a hx of migraines with vomiting.  Usually controlled by Rizatriptan Benzoate (Maxalt).   Past Medical History:  Diagnosis Date   Allergy    Anemia    Anxiety    Depression    Hyperlipidemia    Hypertension    Lumbar stenosis    Migraines    Prediabetes      Allergies  Allergen Reactions   Augmentin [Amoxicillin-Pot Clavulanate] Diarrhea   Doxycycline Nausea Only   Egg White (Diagnostic) Nausea Only   Prozac [Fluoxetine Hcl] Other (See Comments)    Disoriented    Current Outpatient Medications on File Prior to Visit  Medication Sig   ALPRAZolam (XANAX) 0.25 MG tablet Take 1/2 to 1 tablet 2 to 3 x / day ONLY if needed for Acute Anxiety Attacks  & please try to limit to 5 days / week to avoid addiction   aspirin-acetaminophen-caffeine (EXCEDRIN EXTRA STRENGTH) 250-250-65 MG tablet Take 1 tablet by mouth daily.   estradiol (ESTRACE) 0.5 MG tablet Take  1 tablet  Daily  for Estrogen Deficiency   fexofenadine (ALLEGRA) 180 MG tablet Take 180 mg by mouth daily.   pravastatin (PRAVACHOL) 40 MG tablet Take one tablet at bedtime   progesterone (PROMETRIUM) 100 MG capsule Take  1 capsule  Daily   rizatriptan (MAXALT) 10 MG tablet TAKE 1 TABLET BY MOUTH FOR MIGRAINE. MAY REPEAT 1 IN 2 HRS(MAX OF 2 TABLETS IN 24 HOURS)   Ascorbic Acid (VITAMIN C PO) 500 mg 2 (two) times daily. (Patient not taking: Reported on 04/05/2023)   buPROPion  (WELLBUTRIN XL) 150 MG 24 hr tablet Take 1 tablet every Morning for Mood, Focus & Concentration (Patient not taking: Reported on 04/05/2023)   Cholecalciferol (VITAMIN D PO) Take by mouth. Takes 40981 to 12000 units daily. (Patient not taking: Reported on 04/05/2023)   DULoxetine (CYMBALTA) 30 MG capsule TAKE 3 CAPSULES BY MOUTH DAILY FOR MOOD AND CHRONIC PAIN (Patient not taking: Reported on 04/05/2023)   Elastic Bandages & Supports (MEDICAL COMPRESSION STOCKINGS) MISC Knee high compression socks 20-30 pressure, measure and fit   gabapentin (NEURONTIN) 300 MG capsule Take 1 capsule 3 x /day for  Pain (Patient not taking: Reported on 12/14/2022)   hydrochlorothiazide (HYDRODIURIL) 25 MG tablet Take 1/2 -1 tablet daily for BP and fluid for goal <130/80. (Patient not taking: Reported on 12/14/2022)   phentermine (ADIPEX-P) 37.5 MG tablet Take 1/2 to 1 tablet every Morning for Dieting & Weight Loss (Patient not taking: Reported on 04/05/2023)   pseudoephedrine (SUDAFED) 120 MG 12 hr tablet Take  1 tablet  2 x /day (every 12 hours)  for Head and Chest Congestion (Patient not taking: Reported on 12/14/2022)   topiramate (TOPAMAX) 50 MG  tablet Take 1/2 to 1 tablet 2 x /day at Suppertime & Bedtime for Dieting & Weight Loss (Patient not taking: Reported on 04/05/2023)   No current facility-administered medications on file prior to visit.    ROS: all negative except above.   Physical Exam:  BP 122/74   Pulse 77   Temp (!) 97.5 F (36.4 C)   Ht 5' 3.5" (1.613 m)   Wt 160 lb 3.2 oz (72.7 kg)   SpO2 96%   BMI 27.93 kg/m   General Appearance: Well nourished, in no apparent distress. Eyes: PERRLA, EOMs, conjunctiva no swelling or erythema Sinuses: No Frontal/maxillary tenderness ENT/Mouth: Ext aud canals clear, TMs without erythema, bulging. No erythema, swelling, or exudate on post pharynx.  Tonsils not swollen or erythematous. Hearing normal.  Neck: Supple, thyroid normal.  Respiratory: Respiratory effort  normal, BS equal bilaterally without rales, rhonchi, wheezing or stridor.  Cardio: RRR with no MRGs. Brisk peripheral pulses without edema.  Abdomen: Soft, + BS.  Non tender, no guarding, rebound, hernias, masses. Lymphatics: Non tender without lymphadenopathy.  Musculoskeletal: Full ROM, 5/5 strength, normal gait.  Skin: Warm, dry without rashes, lesions, ecchymosis.  Neuro: Cranial nerves intact. Normal muscle tone, no cerebellar symptoms. Sensation intact.  Psych: Awake and oriented X 3, normal affect, Insight and Judgment appropriate.     Adela Glimpse, NP 3:27 PM [facility] Adult [facility] Adolescent Internal Medicine

## 2023-04-11 NOTE — Patient Instructions (Signed)
  Psychological Services:  [facility] [address] [phone]  [place], [place] [address] [place], [place]: [phone] or [phone], [email or web address]  [facility]  [phone], [place], [place] [address] [email or web address]   [facility] - [address] [place], [place] [address] - [P] [phone] Mobile Crisis Teams:  [facility]  Mobile Crisis Care Unit  [phone]  Assertive  Psychotherapeutic Services  [address]  [place]  [address]  [place]  [phone]  Self-Help/Support Groups:  [place] [facility] [employer] of support groups  [phone] (call for more info)

## 2023-04-14 ENCOUNTER — Ambulatory Visit
Admission: RE | Admit: 2023-04-14 | Discharge: 2023-04-14 | Disposition: A | Discharge status: Home / Self Care | Payer: HMO | Source: Ambulatory Visit | Attending: Nurse Practitioner | Admitting: Nurse Practitioner

## 2023-04-14 DIAGNOSIS — R42 Dizziness and giddiness: Secondary | ICD-10-CM | POA: Diagnosis not present

## 2023-04-14 DIAGNOSIS — G43009 Migraine without aura, not intractable, without status migrainosus: Secondary | ICD-10-CM

## 2023-04-14 DIAGNOSIS — R413 Other amnesia: Secondary | ICD-10-CM

## 2023-04-14 DIAGNOSIS — G43909 Migraine, unspecified, not intractable, without status migrainosus: Secondary | ICD-10-CM | POA: Diagnosis not present

## 2023-04-27 ENCOUNTER — Encounter: Payer: Self-pay | Admitting: Nurse Practitioner

## 2023-04-27 ENCOUNTER — Ambulatory Visit (INDEPENDENT_AMBULATORY_CARE_PROVIDER_SITE_OTHER): Payer: HMO | Admitting: Nurse Practitioner

## 2023-04-27 VITALS — BP 122/74 | HR 73 | Temp 97.7°F | Ht 63.5 in | Wt 159.6 lb

## 2023-04-27 DIAGNOSIS — R413 Other amnesia: Secondary | ICD-10-CM

## 2023-04-27 DIAGNOSIS — Z981 Arthrodesis status: Secondary | ICD-10-CM | POA: Diagnosis not present

## 2023-04-27 DIAGNOSIS — G43009 Migraine without aura, not intractable, without status migrainosus: Secondary | ICD-10-CM

## 2023-04-27 DIAGNOSIS — E782 Mixed hyperlipidemia: Secondary | ICD-10-CM

## 2023-04-27 DIAGNOSIS — F331 Major depressive disorder, recurrent, moderate: Secondary | ICD-10-CM

## 2023-04-27 DIAGNOSIS — W19XXXS Unspecified fall, sequela: Secondary | ICD-10-CM

## 2023-04-27 DIAGNOSIS — R5383 Other fatigue: Secondary | ICD-10-CM | POA: Diagnosis not present

## 2023-04-27 NOTE — Progress Notes (Signed)
Follow Up  Rebekah Hamilton was seen today for an episodic visit.  Below references diagnostics and current plan of care:  Assessment and Plan:  Memory loss Mini Mental Fail - unable to recall word list; clock draw normal Discussed that failing Mini Mental does not diagnose dementia. Review of CT normal other than chronic small-vessel ischemic change.  Continue referral to neurology.   Migraine without aura and without status migrainosus, not intractable Continue Maxalt as directed Stay well hydrated. Avoid triggers Discussed benefit of magnesium supplement Continue to monitor  Moderate episode of recurrent major depressive disorder (HCC)/Anxiety Patient to reach out to therapist - Psychological Services Provided on AVS. Restart Cymbalta   Hyperlipidemia, mixed Continue Pravastatin Discussed lifestyle modifications. Recommended diet heavy in fruits and veggies, omega 3's. Decrease consumption of animal meats, cheeses, and dairy products. Remain active and exercise as tolerated. Continue to monitor.  S/P lumbar fusion/Chronic left-sided low back pain with left-sided sciatica Continue to follow Dr. Wyline Mood as directed Continue steroid injections PRN  Other fatigue Restart Phentermine Possible SE of underlying neurological condition. Continue referral to Neurology Continue to monitor  Fall, sequela  Possible SE of underlying neurological condition. Continue referral to Neurology Continue to monitor   Notify office for further evaluation and treatment, questions or concerns if any reported s/s fail to improve.   The patient was advised to call back or seek an in-person evaluation if any symptoms worsen or if the condition fails to improve as anticipated.   Further disposition pending results of labs. Discussed med's effects and SE's.    I discussed the assessment and treatment plan with the patient. The patient was provided an opportunity to ask questions and all were  answered. The patient agreed with the plan and demonstrated an understanding of the instructions.  Discussed med's effects and SE's. Screening labs and tests as requested with regular follow-up as recommended.  I provided 30 minutes of face-to-face time during this encounter including counseling, chart review, and critical decision making was preformed.  Today's Plan of Care is based on a patient-centered health care approach known as shared decision making - the decisions, tests and treatments allow for patient preferences and values to be balanced with clinical evidence.     Future Appointments  Date Time Provider Department Center  05/30/2023 11:00 AM Levert Feinstein, MD [facility] None  06/28/2023  2:00 PM [name], NP [facility] None  07/05/2023 11:30 AM [facility] DX DEXA 1 [facility]    ------------------------------------------------------------------------------------------------------------------   HPI BP 122/74   Pulse 73   Temp 97.7 F (36.5 C)   Ht 5' 3.5" (1.613 m)   Wt 159 lb 9.6 oz (72.4 kg)   SpO2 98%   BMI 27.83 kg/m   77 y.o.female returns to clinic today to discuss ongoing memory issues.  She is alone today.   She confirms today that she had PLIF L3-4 surgery with Dr. Wyline Mood [facility] in 2022 (previously mentions as 6 months ago during last OV) where she was informed that she was under anesthesia longer than normal.  States today is was around 24 hours and that Dr. Wyline Mood informed her that she was under too long. Upon review of records this seems to be sometime around 2022 not 2023. She had to attend rehabilitation afterwards but was unable to recall any of the events that took place during the month of her rehabilitation and approximately one month after.  She had to have another surgery around 01/29/2021 for a loosening of  hardware.  States that this seemed to have have exacerbated her memory loss.  She continues to have back pain and is receiving spinal  injections, last approximately 6-8 weeks ago.  However, she shares with me today that she attended her grandson's wedding in 11/2022 and while going up the steps during the wedding she fell but did not hit her head.  She was able to roll over and get up off of the steps that she fell on.  She then states that she did not feel well enough to attend the wedding reception and had her son take her back to the place where she was staying.  While there she remembers getting up to walk when she doesn't remember what took place and woke up to a large puddle of blood.  She could not open her eyes.  She could not stand. She did not call 911.  She did eventually get herself to her phone and called her son where [place] [facility] ([place] was out of town in [place]) and was stitched up along the mid-frontal part of her head.  She does not feel as though they completed a CT Scan, only x-rays.  Upon review of Care Everywhere I am unable to locate these documents.    During last OV when son accompanied he thought that his mother/patient was overwhelmed by stress and anxiety due to the several traumatic events occur over the last several years.  She had stopped all of her medications as she felt as though they were contributing to her symptoms.  She does continue to take Alprazolam once a week but tries to limit to only PRN.    She admits to sleeping long hours.  She may lay down between 1800-2100 and not awake until 1000-1200 the next day   Of note she does have a hx of migraines with vomiting.  Usually controlled by Rizatriptan Benzoate (Maxalt).   She was referred to Neurology last OV and has an apt scheduled for mid-Oct.  Past Medical History:  Diagnosis Date   Allergy    Anemia    Anxiety    Depression    Hyperlipidemia    Hypertension    Lumbar stenosis    Migraines    Prediabetes      Allergies  Allergen Reactions   Augmentin [Amoxicillin-Pot Clavulanate] Diarrhea    Doxycycline Nausea Only   Egg White (Diagnostic) Nausea Only   Prozac [Fluoxetine Hcl] Other (See Comments)    Disoriented    Current Outpatient Medications on File Prior to Visit  Medication Sig   progesterone (PROMETRIUM) 100 MG capsule Take  1 capsule  Daily   ALPRAZolam (XANAX) 0.25 MG tablet Take 1/2 to 1 tablet 2 to 3 x / day ONLY if needed for Acute Anxiety Attacks  & please try to limit to 5 days / week to avoid addiction (Patient not taking: Reported on 04/27/2023)   Ascorbic Acid (VITAMIN C PO) 500 mg 2 (two) times daily. (Patient not taking: Reported on 04/05/2023)   aspirin-acetaminophen-caffeine (EXCEDRIN EXTRA STRENGTH) 250-250-65 MG tablet Take 1 tablet by mouth daily. (Patient not taking: Reported on 04/27/2023)   buPROPion (WELLBUTRIN XL) 150 MG 24 hr tablet Take 1 tablet every Morning for Mood, Focus & Concentration (Patient not taking: Reported on 04/05/2023)   Cholecalciferol (VITAMIN D PO) Take by mouth. Takes 86578 to 12000 units daily. (Patient not taking: Reported on 04/05/2023)   DULoxetine (CYMBALTA) 30 MG capsule TAKE 3  CAPSULES BY MOUTH DAILY FOR MOOD AND CHRONIC PAIN (Patient not taking: Reported on 04/05/2023)   Elastic Bandages & Supports (MEDICAL COMPRESSION STOCKINGS) MISC Knee high compression socks 20-30 pressure, measure and fit (Patient not taking: Reported on 04/27/2023)   estradiol (ESTRACE) 0.5 MG tablet Take  1 tablet  Daily  for Estrogen Deficiency (Patient not taking: Reported on 04/27/2023)   fexofenadine (ALLEGRA) 180 MG tablet Take 180 mg by mouth daily. (Patient not taking: Reported on 04/27/2023)   gabapentin (NEURONTIN) 300 MG capsule Take 1 capsule 3 x /day for  Pain (Patient not taking: Reported on 12/14/2022)   hydrochlorothiazide (HYDRODIURIL) 25 MG tablet Take 1/2 -1 tablet daily for BP and fluid for goal <130/80. (Patient not taking: Reported on 12/14/2022)   phentermine (ADIPEX-P) 37.5 MG tablet Take 1/2 to 1 tablet every Morning for Dieting & Weight  Loss (Patient not taking: Reported on 04/05/2023)   pravastatin (PRAVACHOL) 40 MG tablet Take one tablet at bedtime (Patient not taking: Reported on 04/27/2023)   pseudoephedrine (SUDAFED) 120 MG 12 hr tablet Take  1 tablet  2 x /day (every 12 hours)  for Head and Chest Congestion (Patient not taking: Reported on 12/14/2022)   rizatriptan (MAXALT) 10 MG tablet TAKE 1 TABLET BY MOUTH FOR MIGRAINE. MAY REPEAT 1 IN 2 HRS(MAX OF 2 TABLETS IN 24 HOURS) (Patient not taking: Reported on 04/27/2023)   topiramate (TOPAMAX) 50 MG tablet Take 1/2 to 1 tablet 2 x /day at Suppertime & Bedtime for Dieting & Weight Loss (Patient not taking: Reported on 04/05/2023)   No current facility-administered medications on file prior to visit.    ROS: all negative except above.   Physical Exam:  BP 122/74   Pulse 73   Temp 97.7 F (36.5 C)   Ht 5' 3.5" (1.613 m)   Wt 159 lb 9.6 oz (72.4 kg)   SpO2 98%   BMI 27.83 kg/m   General Appearance: Well nourished, in no apparent distress. Eyes: PERRLA, EOMs, conjunctiva no swelling or erythema Sinuses: No Frontal/maxillary tenderness ENT/Mouth: Ext aud canals clear, TMs without erythema, bulging. No erythema, swelling, or exudate on post pharynx.  Tonsils not swollen or erythematous. Hearing normal.  Neck: Supple, thyroid normal.  Respiratory: Respiratory effort normal, BS equal bilaterally without rales, rhonchi, wheezing or stridor.  Cardio: RRR with no MRGs. Brisk peripheral pulses without edema.  Abdomen: Soft, + BS.  Non tender, no guarding, rebound, hernias, masses. Lymphatics: Non tender without lymphadenopathy.  Musculoskeletal: Full ROM, 5/5 strength, normal gait.  Skin: Warm, dry without rashes, lesions, ecchymosis.  Neuro: Cranial nerves intact. Normal muscle tone, no cerebellar symptoms. Sensation intact.  Psych: Awake and oriented X 3, normal affect, Insight and Judgment appropriate.     Adela Glimpse, NP 2:01 PM [facility] Adult [facility] Adolescent Internal  Medicine

## 2023-04-27 NOTE — Patient Instructions (Signed)
Mild Neurocognitive Disorder Mild neurocognitive disorder, formerly known as mild cognitive impairment, is a disorder in which memory does not work as well as it should. This disorder may also cause problems with other mental functions, including thought, communication, behavior, and completion of tasks. These problems can be noticed and measured, but they usually do not interfere with daily activities or the ability to live independently. Mild neurocognitive disorder typically develops after 76 years of age, but it can also develop at younger ages. It is not as serious as major neurocognitive disorder, also known as dementia, but it may be the first sign of it. Generally, symptoms of this condition get worse over time. In rare cases, symptoms can get better. What are the causes? This condition may be caused by: Brain disorders like Alzheimer's disease, Parkinson's disease, and other conditions that gradually damage nerve cells (neurodegenerative conditions). Diseases that affect blood vessels in the brain and result in small strokes. Certain infections, such as HIV. Traumatic brain injury. Other medical conditions, such as brain tumors, underactive thyroid (hypothyroidism), and vitamin B12 deficiency. Use of certain drugs or prescription medicines. What increases the risk? The following factors may make you more likely to develop this condition: Being older than 65 years. Being female. Low education level. Diabetes, high blood pressure, high cholesterol, and other conditions that increase the risk for blood vessel diseases. Untreated or undertreated sleep apnea. Having a certain type of gene that can be passed from parent to child (inherited). Chronic health problems such as heart disease, lung disease, liver disease, kidney disease, or depression. What are the signs or symptoms? Symptoms of this condition include: Difficulty remembering. You may: Forget names, phone numbers, or details of  recent events. Forget social events and appointments. Repeatedly forget where you put your car keys or other items. Difficulty thinking and solving problems. You may have trouble with complex tasks, such as: Paying bills. Driving in unfamiliar places. Difficulty communicating. You may have trouble: Finding the right word or naming an object. Forming a sentence that makes sense, or understanding what you read or hear. Changes in your behavior or personality. When this happens, you may: Lose interest in the things that you used to enjoy. Withdraw from social situations. Get angry more easily than usual. Act before thinking. How is this diagnosed? This condition is diagnosed based on: Your symptoms. Your health care provider may ask you and the people you spend time with, such as family and friends, about your symptoms. Evaluation of mental functions (neuropsychological testing). Your health care provider may refer you to a neurologist or mental health specialist to evaluate your mental functions in detail. To identify the cause of your condition, your health care provider may: Get a detailed medical history. Ask about use of alcohol, drugs, and prescription medicines. Do a physical exam. Order blood tests and brain imaging exams. How is this treated? Mild neurocognitive disorder that is caused by medicine use, drug use, infection, or another medical condition may improve when the cause is treated, or when medicines or drugs are stopped. If this disorder has another cause, it generally does not improve and may get worse. In these cases, the goal of treatment is to help you manage the loss of mental function. Treatments in these cases include: Medicine. Medicine mainly helps memory and behavior symptoms. Talk therapy. Talk therapy provides education, emotional support, memory aids, and other ways of making up for problems with mental function. Lifestyle changes, including: Getting regular  exercise. Eating a healthy diet  that includes omega-3 fatty acids. Challenging your thinking and memory skills. Having more social interaction. Follow these instructions at home: Eating and drinking  Drink enough fluid to keep your urine pale yellow. Eat a healthy diet that includes omega-3 fatty acids. These can be found in: Fish. Nuts. Leafy vegetables. Vegetable oils. If you drink alcohol: Limit how much you use to: 0-1 drink a day for women. 0-2 drinks a day for men. Be aware of how much alcohol is in your drink. In the [place] one drink equals one 12 oz bottle of beer (355 mL), one 5 oz glass of wine (148 mL), or one 1 oz glass of hard liquor (44 mL). Lifestyle  Get regular exercise as told by your health care provider. Do not use any products that contain nicotine or tobacco, such as cigarettes, e-cigarettes, and chewing tobacco. If you need help quitting, ask your health care provider. Practice ways to manage stress. If you need help managing stress, ask your health care provider. Continue to have social interaction. Keep your mind active with stimulating activities you enjoy, such as reading or playing games. Make sure to get quality sleep. Follow these tips: Avoid napping during the day. Keep your sleeping area dark and cool. Avoid exercising during the few hours before you go to bed. Avoid caffeine products in the evening. General instructions Take over-the-counter and prescription medicines only as told by your health care provider. Your health care provider may recommend that you avoid taking medicines that can affect thinking, such as pain medicines or sleep medicines. Work with your health care provider to find out what you need help with and what your safety needs are. Keep all follow-up visits. This is important. Where to find more information [employer] on Aging: https://walker.com/ Contact a health care provider if: You have any new symptoms. Get help right  away if: You develop new confusion or your confusion gets worse. You act in ways that place you or your family in danger. Summary Mild neurocognitive disorder is a disorder in which memory does not work as well as it should. Mild neurocognitive disorder can have many causes. It may be the first stage of dementia. To manage your condition, get regular exercise, keep your mind active, get quality sleep, and eat a healthy diet. This information is not intended to replace advice given to you by your health care provider. Make sure you discuss any questions you have with your health care provider. Document Revised: 12/16/2019 Document Reviewed: 12/16/2019 Elsevier Patient Education  2024 [employer].

## 2023-05-11 ENCOUNTER — Ambulatory Visit: Payer: HMO | Admitting: Nurse Practitioner

## 2023-05-15 DIAGNOSIS — H04123 Dry eye syndrome of bilateral lacrimal glands: Secondary | ICD-10-CM | POA: Diagnosis not present

## 2023-05-15 DIAGNOSIS — H43393 Other vitreous opacities, bilateral: Secondary | ICD-10-CM | POA: Diagnosis not present

## 2023-05-15 DIAGNOSIS — H35013 Changes in retinal vascular appearance, bilateral: Secondary | ICD-10-CM | POA: Diagnosis not present

## 2023-05-15 DIAGNOSIS — C441222 Squamous cell carcinoma of skin of right lower eyelid, including canthus: Secondary | ICD-10-CM | POA: Diagnosis not present

## 2023-05-29 ENCOUNTER — Encounter: Payer: Self-pay | Admitting: Nurse Practitioner

## 2023-05-29 ENCOUNTER — Ambulatory Visit (INDEPENDENT_AMBULATORY_CARE_PROVIDER_SITE_OTHER): Payer: HMO | Admitting: Nurse Practitioner

## 2023-05-29 VITALS — BP 130/70 | HR 76 | Temp 97.8°F | Resp 17 | Ht 63.5 in | Wt 156.0 lb

## 2023-05-29 DIAGNOSIS — R5383 Other fatigue: Secondary | ICD-10-CM | POA: Diagnosis not present

## 2023-05-29 DIAGNOSIS — Z981 Arthrodesis status: Secondary | ICD-10-CM | POA: Diagnosis not present

## 2023-05-29 DIAGNOSIS — Z79899 Other long term (current) drug therapy: Secondary | ICD-10-CM

## 2023-05-29 DIAGNOSIS — R413 Other amnesia: Secondary | ICD-10-CM

## 2023-05-29 DIAGNOSIS — F331 Major depressive disorder, recurrent, moderate: Secondary | ICD-10-CM

## 2023-05-29 DIAGNOSIS — G43009 Migraine without aura, not intractable, without status migrainosus: Secondary | ICD-10-CM | POA: Diagnosis not present

## 2023-05-29 DIAGNOSIS — E782 Mixed hyperlipidemia: Secondary | ICD-10-CM | POA: Diagnosis not present

## 2023-05-29 DIAGNOSIS — W19XXXS Unspecified fall, sequela: Secondary | ICD-10-CM

## 2023-05-29 NOTE — Patient Instructions (Signed)

## 2023-05-29 NOTE — Progress Notes (Signed)
Follow Up  Rebekah Hamilton was seen today for a follow up  Below references diagnostics and current plan of care:  Assessment and Plan:  Memory loss Continue to follow with Neurology tomorrow 05/30/23  Migraine without aura and without status migrainosus, not intractable Continue Maxalt as directed Stay well hydrated. Avoid triggers Discussed benefit of magnesium supplement Continue to monitor  Moderate episode of recurrent major depressive disorder (HCC)/Anxiety Patient to reach out to therapist - Psychological Services Provided on AVS. Restart Cymbalta   Hyperlipidemia, mixed Continue Pravastatin Discussed lifestyle modifications. Recommended diet heavy in fruits and veggies, omega 3's. Decrease consumption of animal meats, cheeses, and dairy products. Remain active and exercise as tolerated. Continue to monitor.  S/P lumbar fusion/Chronic left-sided low back pain with left-sided sciatica Continue to follow Dr. Wyline Mood as directed Continue steroid injections PRN  Other fatigue Discussed restarting Phentermine - patient not receptive. Possible SE of underlying neurological condition. Continue referral to Neurology Continue to monitor  Fall, sequela  Possible SE of underlying neurological condition. Continue referral to Neurology Continue to monitor  Orders Placed This Encounter  Procedures   CBC with Differential/Platelet   COMPLETE METABOLIC PANEL WITH GFR   Lipid panel   Notify office for further evaluation and treatment, questions or concerns if any reported s/s fail to improve.   The patient was advised to call back or seek an in-person evaluation if any symptoms worsen or if the condition fails to improve as anticipated.   Further disposition pending results of labs. Discussed med's effects and SE's.    I discussed the assessment and treatment plan with the patient. The patient was provided an opportunity to ask questions and all were answered. The patient  agreed with the plan and demonstrated an understanding of the instructions.  Discussed med's effects and SE's. Screening labs and tests as requested with regular follow-up as recommended.  I provided 30 minutes of face-to-face time during this encounter including counseling, chart review, and critical decision making was preformed.  Today's Plan of Care is based on a patient-centered health care approach known as shared decision making - the decisions, tests and treatments allow for patient preferences and values to be balanced with clinical evidence.     Future Appointments  Date Time Provider Department Center  05/29/2023  2:45 PM Adela Glimpse, NP [facility] None  05/30/2023 11:00 AM Levert Feinstein, MD [facility] None  06/28/2023  2:00 PM [name], NP [facility] None  07/05/2023 11:30 AM [facility] DX DEXA 1 [facility]    ------------------------------------------------------------------------------------------------------------------   HPI BP 130/70   Pulse 76   Temp 97.8 F (36.6 C)   Resp 17   Ht 5' 3.5" (1.613 m)   Wt 156 lb (70.8 kg)   SpO2 97%   BMI 27.20 kg/m   77 y.o.female returns to clinic today to discuss ongoing memory issues.  She was seen one month ago and feels there has been no improvement.  She is actually feeling worse today stating that her son is in the hospital and daughter is battling a sickness.  This increases her overall worry.  She presents alone today.   She continues to share that that she had PLIF L3-4 surgery with Dr. Wyline Mood [facility] in 2022 (previously mentions as 6 months ago during last OV) where she was informed that she was under anesthesia longer than normal.  States today is was around 24 hours and that Dr. Wyline Mood informed her that she was under too long. Upon review  of records this seems to be sometime around 2022 not 2023. She had to attend rehabilitation afterwards but was unable to recall any of the events that took place during the  month of her rehabilitation and approximately one month after.  She had to have another surgery around 01/29/2021 for a loosening of hardware.  States that this seemed to have have exacerbated her memory loss.  She continues to have back pain and is receiving spinal injections, last approximately 6-8 weeks ago.  However, she shares with me today that she attended her grandson's wedding in 11/2022 and while going up the steps during the wedding she fell but did not hit her head.  She was able to roll over and get up off of the steps that she fell on.  She then states that she did not feel well enough to attend the wedding reception and had her son take her back to the place where she was staying.  While there she remembers getting up to walk when she doesn't remember what took place and woke up to a large puddle of blood.  She could not open her eyes.  She could not stand. She did not call 911.  She did eventually get herself to her phone and called her son where [place] ([place] was out of town in [place]) and was stitched up along the mid-frontal part of her head.  She does not feel as though they completed a CT Scan, only x-rays.  Upon review of Care Everywhere I am unable to locate these documents.    Son had accompanied patient during a routine visit  and thought that his mother/patient was overwhelmed by stress and anxiety due to the several traumatic events occur over the last several years.  She had stopped all of her medications as she felt as though they were contributing to her symptoms.  She does continue to take Alprazolam once a week but tries to limit to only PRN.    She continues to sleep long hours.  She may lay down between 1800-2100 and not awake until 1000-1200 the next day.  She was asked to restart Phentermine but has not yet restarted this medication.  Of note she does have a hx of migraines with vomiting.  Usually controlled by Rizatriptan Benzoate  (Maxalt).   She has continued to refrain from taking all medications other than Duloxetine, Rizatriptan, Pravastatin and Progesterone.  Has a f/u scheduled with GYN next month.   Past Medical History:  Diagnosis Date   Allergy    Anemia    Anxiety    Depression    Hyperlipidemia    Hypertension    Lumbar stenosis    Migraines    Prediabetes      Allergies  Allergen Reactions   Augmentin [Amoxicillin-Pot Clavulanate] Diarrhea   Doxycycline Nausea Only   Egg White (Diagnostic) Nausea Only   Prozac [Fluoxetine Hcl] Other (See Comments)    Disoriented    Current Outpatient Medications on File Prior to Visit  Medication Sig   DULoxetine (CYMBALTA) 30 MG capsule TAKE 3 CAPSULES BY MOUTH DAILY FOR MOOD AND CHRONIC PAIN   estradiol (ESTRACE) 0.5 MG tablet Take  1 tablet  Daily  for Estrogen Deficiency   fexofenadine (ALLEGRA) 180 MG tablet Take 180 mg by mouth daily.   pravastatin (PRAVACHOL) 40 MG tablet Take one tablet at bedtime   progesterone (PROMETRIUM) 100 MG capsule Take  1 capsule  Daily  rizatriptan (MAXALT) 10 MG tablet TAKE 1 TABLET BY MOUTH FOR MIGRAINE. MAY REPEAT 1 IN 2 HRS(MAX OF 2 TABLETS IN 24 HOURS)   topiramate (TOPAMAX) 50 MG tablet Take 1/2 to 1 tablet 2 x /day at Suppertime & Bedtime for Dieting & Weight Loss   ALPRAZolam (XANAX) 0.25 MG tablet Take 1/2 to 1 tablet 2 to 3 x / day ONLY if needed for Acute Anxiety Attacks  & please try to limit to 5 days / week to avoid addiction (Patient not taking: Reported on 04/27/2023)   Ascorbic Acid (VITAMIN C PO) 500 mg 2 (two) times daily. (Patient not taking: Reported on 04/05/2023)   aspirin-acetaminophen-caffeine (EXCEDRIN EXTRA STRENGTH) 250-250-65 MG tablet Take 1 tablet by mouth daily. (Patient not taking: Reported on 04/27/2023)   buPROPion (WELLBUTRIN XL) 150 MG 24 hr tablet Take 1 tablet every Morning for Mood, Focus & Concentration (Patient not taking: Reported on 04/05/2023)   Cholecalciferol (VITAMIN D PO) Take by  mouth. Takes 24401 to 12000 units daily. (Patient not taking: Reported on 04/05/2023)   Elastic Bandages & Supports (MEDICAL COMPRESSION STOCKINGS) MISC Knee high compression socks 20-30 pressure, measure and fit (Patient not taking: Reported on 04/27/2023)   gabapentin (NEURONTIN) 300 MG capsule Take 1 capsule 3 x /day for  Pain (Patient not taking: Reported on 12/14/2022)   hydrochlorothiazide (HYDRODIURIL) 25 MG tablet Take 1/2 -1 tablet daily for BP and fluid for goal <130/80. (Patient not taking: Reported on 12/14/2022)   phentermine (ADIPEX-P) 37.5 MG tablet Take 1/2 to 1 tablet every Morning for Dieting & Weight Loss (Patient not taking: Reported on 04/05/2023)   pseudoephedrine (SUDAFED) 120 MG 12 hr tablet Take  1 tablet  2 x /day (every 12 hours)  for Head and Chest Congestion (Patient not taking: Reported on 12/14/2022)   No current facility-administered medications on file prior to visit.    ROS: all negative except above.   Physical Exam:  BP 130/70   Pulse 76   Temp 97.8 F (36.6 C)   Resp 17   Ht 5' 3.5" (1.613 m)   Wt 156 lb (70.8 kg)   SpO2 97%   BMI 27.20 kg/m   General Appearance: Well nourished, in no apparent distress. Eyes: PERRLA, EOMs, conjunctiva no swelling or erythema Sinuses: No Frontal/maxillary tenderness ENT/Mouth: Ext aud canals clear, TMs without erythema, bulging. No erythema, swelling, or exudate on post pharynx.  Tonsils not swollen or erythematous. Hearing normal.  Neck: Supple, thyroid normal.  Respiratory: Respiratory effort normal, BS equal bilaterally without rales, rhonchi, wheezing or stridor.  Cardio: RRR with no MRGs. Brisk peripheral pulses without edema.  Abdomen: Soft, + BS.  Non tender, no guarding, rebound, hernias, masses. Lymphatics: Non tender without lymphadenopathy.  Musculoskeletal: Full ROM, 5/5 strength, normal gait.  Skin: Warm, dry without rashes, lesions, ecchymosis.  Neuro: Cranial nerves intact. Normal muscle tone, no  cerebellar symptoms. Sensation intact.  Psych: Awake and oriented X 3, normal affect, Insight and Judgment appropriate.     Adela Glimpse, NP 2:25 PM [facility]

## 2023-05-30 ENCOUNTER — Ambulatory Visit: Payer: HMO | Admitting: Neurology

## 2023-05-30 ENCOUNTER — Telehealth: Payer: Self-pay | Admitting: Neurology

## 2023-05-30 ENCOUNTER — Encounter: Payer: Self-pay | Admitting: Neurology

## 2023-05-30 VITALS — BP 155/88 | HR 64 | Ht 64.0 in | Wt 156.4 lb

## 2023-05-30 DIAGNOSIS — G3184 Mild cognitive impairment, so stated: Secondary | ICD-10-CM | POA: Diagnosis not present

## 2023-05-30 LAB — COMPLETE METABOLIC PANEL WITH GFR
AG Ratio: 2.2 (calc) (ref 1.0–2.5)
ALT: 22 U/L (ref 6–29)
AST: 25 U/L (ref 10–35)
Albumin: 4.7 g/dL (ref 3.6–5.1)
Alkaline phosphatase (APISO): 76 U/L (ref 37–153)
BUN/Creatinine Ratio: 18 (calc) (ref 6–22)
BUN: 20 mg/dL (ref 7–25)
CO2: 24 mmol/L (ref 20–32)
Calcium: 10 mg/dL (ref 8.6–10.4)
Chloride: 107 mmol/L (ref 98–110)
Creat: 1.14 mg/dL — ABNORMAL HIGH (ref 0.60–1.00)
Globulin: 2.1 g/dL (ref 1.9–3.7)
Glucose, Bld: 86 mg/dL (ref 65–99)
Potassium: 4.3 mmol/L (ref 3.5–5.3)
Sodium: 142 mmol/L (ref 135–146)
Total Bilirubin: 0.5 mg/dL (ref 0.2–1.2)
Total Protein: 6.8 g/dL (ref 6.1–8.1)
eGFR: 50 mL/min/{1.73_m2} — ABNORMAL LOW (ref >=60)

## 2023-05-30 LAB — CBC WITH DIFFERENTIAL/PLATELET
Absolute Monocytes: 818 {cells}/uL (ref 200–950)
Basophils Absolute: 51 {cells}/uL (ref 0–200)
Basophils Relative: 0.7 %
Eosinophils Absolute: 161 {cells}/uL (ref 15–500)
Eosinophils Relative: 2.2 %
HCT: 45.4 % — ABNORMAL HIGH (ref 35.0–45.0)
Hemoglobin: 14.9 g/dL (ref 11.7–15.5)
Lymphs Abs: 2343 {cells}/uL (ref 850–3900)
MCH: 30.1 pg (ref 27.0–33.0)
MCHC: 32.8 g/dL (ref 32.0–36.0)
MCV: 91.7 fL (ref 80.0–100.0)
MPV: 10.4 fL (ref 7.5–12.5)
Monocytes Relative: 11.2 %
Neutro Abs: 3927 {cells}/uL (ref 1500–7800)
Neutrophils Relative %: 53.8 %
Platelets: 332 10*3/uL (ref 140–400)
RBC: 4.95 10*6/uL (ref 3.80–5.10)
RDW: 13.1 % (ref 11.0–15.0)
Total Lymphocyte: 32.1 %
WBC: 7.3 10*3/uL (ref 3.8–10.8)

## 2023-05-30 LAB — LIPID PANEL
Cholesterol: 146 mg/dL (ref <200)
HDL: 42 mg/dL — ABNORMAL LOW (ref >=50)
LDL Cholesterol (Calc): 79 mg/dL
Non-HDL Cholesterol (Calc): 104 mg/dL (ref <130)
Total CHOL/HDL Ratio: 3.5 (calc) (ref <5.0)
Triglycerides: 149 mg/dL (ref <150)

## 2023-05-30 NOTE — Progress Notes (Signed)
Chief Complaint  Patient presents with   New Patient (Initial Visit)    Rm 14. Patient alone, States she cannot remember much this time. Patient reports being under for surgery longer than anticipated and then a rod broke and she had to have another surgery. Now noticing trouble with memory, [place] and hit head and had stitches and then noticed more memory concerns. Remembering short term and long term is difficulty.    ASSESSMENT AND PLAN  Rebekah Hamilton is a 77 y.o. female   Mild cognitive impairment  MoCA examination 27/30 today  MRI of brain for evaluation  DIAGNOSTIC DATA (LABS, IMAGING, TESTING) - I reviewed patient records, labs, notes, testing and imaging myself where available.   MEDICAL HISTORY:  Rebekah Hamilton, is a 77 year old female seen in request by her primary care doctor [name], for evaluation of memory loss initial evaluation May 30, 2023  History is obtained from the patient and review of electronic medical records. I personally reviewed pertinent available imaging films in PACS.   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, underwent a lot of stress in recent years, she lives alone, complains of rapid worsening memory issues, sometimes to the point she could not remember her familiar route while driving  She also reported to lumbar decompression surgery within short period of time in 2021-07-07, she was prolonged or for extended period of time,  In April 2024, she was at her grandson's wedding, she fell, had headache, decided to 1 back to hotel to take a nap, woke up from sleep, on her way to bathroom, she passed out, hit the edge of the door frame, she woke up on the floor, not sure how long has passed, took her a while to reach her cell phone to call her family for help, was taken to the  local hospital,  Since then, she seems to have worsening memory loss, also complains of depression, her mother passed away in 07-07-2022, she was the main caregiver of her, she also have strained relationship with her deceased husband, she really worries about her 2 adult children, that live out of state    Laboratory evaluation in January 2024 showed normal TSH, A1c was 5.9, normal vitamin D 33, CBC hemoglobin of 13.3, CMP showed mild elevated creatinine 1.19 with EGFR of 48  PHYSICAL EXAM:   Vitals:   05/30/23 1050  Weight: 156 lb 6.4 oz (70.9 kg)  Height: 5\' 4"  (1.626 m)   Not recorded     Body mass index is 26.85 kg/m.  PHYSICAL EXAMNIATION:  Gen: NAD, conversant, well nourised, well groomed                     Cardiovascular: Regular rate rhythm, no peripheral edema, warm, nontender. Eyes: Conjunctivae clear without exudates or hemorrhage Neck: Supple, no carotid bruits. Pulmonary: Clear to auscultation bilaterally   NEUROLOGICAL EXAM:  MENTAL STATUS: Speech/cognition: Awake, alert, oriented to history taking and casual conversation    05/30/2023   10:54 AM  Montreal Cognitive Assessment   Visuospatial/ Executive (0/5) 4  Naming (0/3) 3  Attention: Read list of digits (0/2) 2  Attention: Read list of letters (0/1) 1  Attention: Serial 7 subtraction starting at 100 (0/3) 3  Language: Repeat phrase (0/2) 1  Language : Fluency (0/1) 1  Abstraction (0/2) 2  Delayed Recall (0/5) 4  Orientation (0/6) 6  Total 27    CRANIAL NERVES: CN II: Visual fields are full to confrontation. Pupils are round equal and briskly reactive to light. CN III, IV, VI: extraocular movement are normal. No ptosis. CN V: Facial sensation is intact to light touch CN VII: Face is symmetric with normal eye closure  CN VIII: Hearing is normal to causal conversation. CN IX, X: Phonation is normal. CN XI: Head turning and shoulder shrug are intact  MOTOR: There is no pronator drift of  out-stretched arms. Muscle bulk and tone are normal. Muscle strength is normal.  REFLEXES: Reflexes are 2 and symmetric at the biceps, triceps, knees, and ankles. Plantar responses are flexor.  SENSORY: Intact to light touch, pinprick and vibratory sensation are intact in fingers and toes.  COORDINATION: There is no trunk or limb dysmetria noted.  GAIT/STANCE: Push-up to get up from seated position, cautious  REVIEW OF SYSTEMS:  Full 14 system review of systems performed and notable only for as above All other review of systems were negative.   ALLERGIES: Allergies  Allergen Reactions   Augmentin [Amoxicillin-Pot Clavulanate] Diarrhea   Doxycycline Nausea Only   Egg White (Diagnostic) Nausea Only   Prozac [Fluoxetine Hcl] Other (See Comments)    Disoriented    HOME MEDICATIONS: Current Outpatient Medications  Medication Sig Dispense Refill   DULoxetine (CYMBALTA) 30 MG capsule TAKE 3 CAPSULES BY MOUTH DAILY FOR MOOD AND CHRONIC PAIN 270 capsule 3   estradiol (ESTRACE) 0.5 MG tablet Take  1 tablet  Daily  for Estrogen Deficiency 90 tablet 3   fexofenadine (ALLEGRA) 180 MG tablet Take 180 mg by mouth daily.     pravastatin (PRAVACHOL) 40 MG tablet Take one tablet at bedtime 90 tablet 3   progesterone (PROMETRIUM) 100 MG capsule Take  1 capsule  Daily 90 capsule 3   rizatriptan (MAXALT) 10 MG tablet TAKE 1 TABLET BY MOUTH FOR MIGRAINE. MAY REPEAT 1 IN 2 HRS(MAX OF 2 TABLETS IN 24 HOURS) 12 tablet 2   ALPRAZolam (XANAX) 0.25 MG tablet Take 1/2 to 1 tablet 2 to 3 x / day ONLY if needed for Acute Anxiety Attacks  & please try to limit to 5 days / week to avoid addiction (Patient not taking: Reported on 04/27/2023) 90 tablet 0   Ascorbic Acid (VITAMIN C PO) 500 mg 2 (two) times daily. (Patient not taking: Reported on 04/05/2023)     aspirin-acetaminophen-caffeine (EXCEDRIN EXTRA STRENGTH) 250-250-65 MG tablet Take 1 tablet by mouth daily. (Patient not taking: Reported on 04/27/2023)      buPROPion (WELLBUTRIN XL) 150 MG 24 hr tablet Take 1 tablet every Morning for Mood, Focus & Concentration (Patient not taking: Reported on 04/05/2023) 90 tablet 3   Cholecalciferol (VITAMIN D PO) Take by mouth. Takes 40981 to 12000 units daily. (Patient not taking: Reported on 04/05/2023)     Elastic Bandages & Supports (MEDICAL COMPRESSION STOCKINGS) MISC Knee high compression socks 20-30 pressure, measure and fit (Patient not taking: Reported on 04/27/2023) 2 each 0   gabapentin (NEURONTIN) 300 MG capsule Take 1 capsule 3 x /day for  Pain (Patient not taking: Reported on 12/14/2022) 270 capsule 0   hydrochlorothiazide (HYDRODIURIL) 25 MG tablet Take 1/2 -1 tablet daily for BP and fluid for goal <130/80. (Patient not taking: Reported on 12/14/2022) 90 tablet 1   phentermine (ADIPEX-P) 37.5 MG tablet Take 1/2 to 1 tablet every Morning for Dieting & Weight Loss (  Patient not taking: Reported on 04/05/2023) 90 tablet 1   pseudoephedrine (SUDAFED) 120 MG 12 hr tablet Take  1 tablet  2 x /day (every 12 hours)  for Head and Chest Congestion (Patient not taking: Reported on 12/14/2022) 60 tablet 3   topiramate (TOPAMAX) 50 MG tablet Take 1/2 to 1 tablet 2 x /day at Suppertime & Bedtime for Dieting & Weight Loss (Patient not taking: Reported on 05/30/2023) 180 tablet 1   No current facility-administered medications for this visit.    PAST MEDICAL HISTORY: Past Medical History:  Diagnosis Date   Allergy    Anemia    Anxiety    Depression    Hyperlipidemia    Hypertension    Lumbar stenosis    Migraines    Prediabetes     PAST SURGICAL HISTORY: Past Surgical History:  Procedure Laterality Date   BREAST LUMPECTOMY Bilateral    CHOLECYSTECTOMY  1999   SPINE SURGERY      FAMILY HISTORY: Family History  Problem Relation Age of Onset   Stroke Mother    Hypertension Mother    Heart attack Father 67   Other Maternal Grandmother        blood clot after surgery   Heart attack Paternal Grandmother      SOCIAL HISTORY: Social History   Socioeconomic History   Marital status: Widowed    Spouse name: Not on file   Number of children: 2   Years of education: Not on file   Highest education level: Not on file  Occupational History   Occupation: accountant  Tobacco Use   Smoking status: Never   Smokeless tobacco: Never  Vaping Use   Vaping status: Never Used  Substance and Sexual Activity   Alcohol use: Yes    Alcohol/week: 2.0 standard drinks of alcohol    Types: 2 Glasses of wine per week   Drug use: No   Sexual activity: Not Currently  Other Topics Concern   Not on file  Social History Narrative   Not on file   Social Determinants of Health   Financial Resource Strain: Not on file  Food Insecurity: No Food Insecurity (08/19/2021)   Hunger Vital Sign    Worried About Running Out of Food in the Last Year: Never true    Ran Out of Food in the Last Year: Never true  Transportation Needs: No Transportation Needs (08/19/2021)   PRAPARE - Administrator, Civil Service (Medical): No    Lack of Transportation (Non-Medical): No  Physical Activity: Insufficiently Active (04/02/2018)   Exercise Vital Sign    Days of Exercise per Week: 3 days    Minutes of Exercise per Session: 20 min  Stress: Stress Concern Present (04/02/2018)   [employer] of Occupational Health - Occupational Stress Questionnaire    Feeling of Stress : To some extent  Social Connections: Not on file  Intimate Partner Violence: Not on file      Levert Feinstein, M.D. Ph.D.  [facility] [address], [address] [place], [place] [address] Ph: [phone] Fax: [phone]  CC:  Adela Glimpse, NP [address] [place],  [place] [address]  [name], MD

## 2023-05-30 NOTE — Telephone Encounter (Signed)
[facility] adv NPR sent to GI [phone]

## 2023-06-04 ENCOUNTER — Ambulatory Visit
Admission: RE | Admit: 2023-06-04 | Discharge: 2023-06-04 | Disposition: A | Discharge status: Home / Self Care | Payer: HMO | Source: Ambulatory Visit | Attending: Neurology | Admitting: Neurology

## 2023-06-04 DIAGNOSIS — G3184 Mild cognitive impairment, so stated: Secondary | ICD-10-CM | POA: Diagnosis not present

## 2023-06-14 ENCOUNTER — Encounter: Payer: HMO | Admitting: Internal Medicine

## 2023-06-15 ENCOUNTER — Telehealth: Payer: Self-pay | Admitting: Neurology

## 2023-06-15 NOTE — Telephone Encounter (Signed)
Left msg to return call

## 2023-06-15 NOTE — Telephone Encounter (Signed)
Please call patient, MRI of the brain showed extensive small vessel disease involving the deep brain structures  There is also evidence of arthritis at upper cervical region  Please schedule a virtual visit with her to go over MRI findings. (Okay to double book with injection slot on Wednesday)   IMPRESSION: This MRI of the brain without contrast shows the following: Extensive T2/FLAIR hyperintense foci in the cerebral hemispheres.  Some foci also noted in the thalamus and pons.  These are consistent with moderate chronic microvascular ischemic change, more than typical for age.  There are also a few small superimposed chronic lacunar infarctions in the thalamus and hemispheres. No acute findings in the brain. Fluid in and around the right atlantooccipital joint and atlanto-axial joint.  This could be a source of neck pain and headache.

## 2023-06-16 DIAGNOSIS — Z124 Encounter for screening for malignant neoplasm of cervix: Secondary | ICD-10-CM | POA: Diagnosis not present

## 2023-06-16 DIAGNOSIS — Z01419 Encounter for gynecological examination (general) (routine) without abnormal findings: Secondary | ICD-10-CM | POA: Diagnosis not present

## 2023-06-16 DIAGNOSIS — Z113 Encounter for screening for infections with a predominantly sexual mode of transmission: Secondary | ICD-10-CM | POA: Diagnosis not present

## 2023-06-16 DIAGNOSIS — Z01411 Encounter for gynecological examination (general) (routine) with abnormal findings: Secondary | ICD-10-CM | POA: Diagnosis not present

## 2023-06-16 DIAGNOSIS — Z1231 Encounter for screening mammogram for malignant neoplasm of breast: Secondary | ICD-10-CM | POA: Diagnosis not present

## 2023-06-16 LAB — HM MAMMOGRAPHY

## 2023-06-19 ENCOUNTER — Encounter: Payer: Self-pay | Admitting: Internal Medicine

## 2023-06-19 ENCOUNTER — Telehealth (INDEPENDENT_AMBULATORY_CARE_PROVIDER_SITE_OTHER): Payer: HMO | Admitting: Neurology

## 2023-06-19 DIAGNOSIS — G3184 Mild cognitive impairment, so stated: Secondary | ICD-10-CM | POA: Diagnosis not present

## 2023-06-19 DIAGNOSIS — M545 Low back pain, unspecified: Secondary | ICD-10-CM

## 2023-06-19 DIAGNOSIS — I679 Cerebrovascular disease, unspecified: Secondary | ICD-10-CM | POA: Insufficient documentation

## 2023-06-19 NOTE — Progress Notes (Signed)
No chief complaint on file.  ASSESSMENT AND PLAN  Rebekah Hamilton is a 77 y.o. female   Mild cognitive impairment Cerebrovascular disease  MoCA examination 27/30   MRI of brain for evaluation showed significant cerebrovascular disease, evidence of lacunar infarction,  Vascular risk factor of aging, hypertension,  Complete evaluation with echocardiogram, ultrasound of carotid artery, she is to continue aspirin 81 mg daily,  She is no longer a good candidate for triptan treatment for chronic migraine headache,  She has no strong family history of dementia, her current cognitive complaint likely due to combination of her worsening depression, anxiety, insomnia, cerebrovascular disease,  Suggest her moderate exercise, refer to physical therapy  DIAGNOSTIC DATA (LABS, IMAGING, TESTING) - I reviewed patient records, labs, notes, testing and imaging myself where available.   MEDICAL HISTORY:  Rebekah Hamilton, is a 77 year old female seen in request by her primary care doctor Rebekah Hamilton, for evaluation of memory loss initial evaluation May 30, 2023  History is obtained from the patient and review of electronic medical records. I personally reviewed pertinent available imaging films in PACS.   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, 2022, underwent a lot of stress in recent years, she lives alone, complains of rapid worsening memory issues, sometimes to the point she could not remember her familiar route while driving  She also reported to lumbar decompression surgery within short period of time in 06-27-2021, she was prolonged or for extended period of time,  In April 2024, she was at her grandson's wedding, she fell, had headache, decided to 1 back to hotel to take a nap, woke up from sleep, on her way to bathroom, she passed out, hit the edge of the  door frame, she woke up on the floor, not sure how long has passed, took her a while to reach her cell phone to call her family for help, was taken to the local hospital,  Since then, she seems to have worsening memory loss, also complains of depression, her mother passed away in 06/27/2022, she was the main caregiver of her, she also have strained relationship with her deceased husband, she really worries about her 2 adult children, that live out of state    Laboratory evaluation in January 2024 showed normal TSH, A1c was 5.9, normal vitamin D 33, CBC hemoglobin of 13.3, CMP showed mild elevated creatinine 1.19 with EGFR of 48   Virtual Visit via video UPDATE June 19, 2023 I discussed the limitations of evaluation and management by telemedicine and the availability of in person appointments. The patient expressed understanding and agreed to proceed  Location: Provider: [facility] office; Patient: Home  I connected with Rebekah Hamilton  on June 19, 2023 by a video enabled telemedicine application and verified that I am speaking with the correct person using two identifiers.  UPDATED HiSTORY Virtual visit to review MRI of the brain from June 04, 2023, generalized atrophy, extensive T2/FLAIR hyperintensity signal at cerebral hemisphere, also involving thalamus, pons, more than typical for ages,'s few small superimposed chronic lacunar infarction left thalamus and hemisphere  Fluid in and around the right atlantooccipital joints and atlantoaxial joints, she denies significant neck pain   Observations/Objective: I have reviewed problem lists, medications, allergies. Awake, alert, oriented to history taking and casual conversation.  No aphasia, facial symmetric, moving 4 extremities without difficulties  REVIEW OF  SYSTEMS:  Full 14 system review of systems performed and notable only for as above All other review of systems were negative.   ALLERGIES: Allergies  Allergen Reactions    Augmentin [Amoxicillin-Pot Clavulanate] Diarrhea   Doxycycline Nausea Only   Egg White (Diagnostic) Nausea Only   Prozac [Fluoxetine Hcl] Other (See Comments)    Disoriented    HOME MEDICATIONS: Current Outpatient Medications  Medication Sig Dispense Refill   ALPRAZolam (XANAX) 0.25 MG tablet Take 1/2 to 1 tablet 2 to 3 x / day ONLY if needed for Acute Anxiety Attacks  & please try to limit to 5 days / week to avoid addiction (Patient not taking: Reported on 04/27/2023) 90 tablet 0   Ascorbic Acid (VITAMIN C PO) 500 mg 2 (two) times daily. (Patient not taking: Reported on 04/05/2023)     aspirin-acetaminophen-caffeine (EXCEDRIN EXTRA STRENGTH) 250-250-65 MG tablet Take 1 tablet by mouth daily. (Patient not taking: Reported on 04/27/2023)     buPROPion (WELLBUTRIN XL) 150 MG 24 hr tablet Take 1 tablet every Morning for Mood, Focus & Concentration (Patient not taking: Reported on 04/05/2023) 90 tablet 3   Cholecalciferol (VITAMIN D PO) Take by mouth. Takes 95284 to 12000 units daily. (Patient not taking: Reported on 04/05/2023)     DULoxetine (CYMBALTA) 30 MG capsule TAKE 3 CAPSULES BY MOUTH DAILY FOR MOOD AND CHRONIC PAIN 270 capsule 3   Elastic Bandages & Supports (MEDICAL COMPRESSION STOCKINGS) MISC Knee high compression socks 20-30 pressure, measure and fit (Patient not taking: Reported on 04/27/2023) 2 each 0   estradiol (ESTRACE) 0.5 MG tablet Take  1 tablet  Daily  for Estrogen Deficiency 90 tablet 3   fexofenadine (ALLEGRA) 180 MG tablet Take 180 mg by mouth daily.     gabapentin (NEURONTIN) 300 MG capsule Take 1 capsule 3 x /day for  Pain (Patient not taking: Reported on 12/14/2022) 270 capsule 0   hydrochlorothiazide (HYDRODIURIL) 25 MG tablet Take 1/2 -1 tablet daily for BP and fluid for goal <130/80. (Patient not taking: Reported on 12/14/2022) 90 tablet 1   phentermine (ADIPEX-P) 37.5 MG tablet Take 1/2 to 1 tablet every Morning for Dieting & Weight Loss (Patient not taking: Reported on  04/05/2023) 90 tablet 1   pravastatin (PRAVACHOL) 40 MG tablet Take one tablet at bedtime 90 tablet 3   progesterone (PROMETRIUM) 100 MG capsule Take  1 capsule  Daily 90 capsule 3   pseudoephedrine (SUDAFED) 120 MG 12 hr tablet Take  1 tablet  2 x /day (every 12 hours)  for Head and Chest Congestion (Patient not taking: Reported on 12/14/2022) 60 tablet 3   rizatriptan (MAXALT) 10 MG tablet TAKE 1 TABLET BY MOUTH FOR MIGRAINE. MAY REPEAT 1 IN 2 HRS(MAX OF 2 TABLETS IN 24 HOURS) 12 tablet 2   topiramate (TOPAMAX) 50 MG tablet Take 1/2 to 1 tablet 2 x /day at Suppertime & Bedtime for Dieting & Weight Loss (Patient not taking: Reported on 05/30/2023) 180 tablet 1   No current facility-administered medications for this visit.    PAST MEDICAL HISTORY: Past Medical History:  Diagnosis Date   Allergy    Anemia    Anxiety    Depression    Hyperlipidemia    Hypertension    Lumbar stenosis    Migraines    Prediabetes     PAST SURGICAL HISTORY: Past Surgical History:  Procedure Laterality Date   BREAST LUMPECTOMY Bilateral    CHOLECYSTECTOMY  1999   SPINE SURGERY  FAMILY HISTORY: Family History  Problem Relation Age of Onset   Stroke Mother    Hypertension Mother    Heart attack Father 12   Other Maternal Grandmother        blood clot after surgery   Heart attack Paternal Grandmother     SOCIAL HISTORY: Social History   Socioeconomic History   Marital status: Widowed    Spouse name: Not on file   Number of children: 2   Years of education: Not on file   Highest education level: Not on file  Occupational History   Occupation: accountant  Tobacco Use   Smoking status: Never   Smokeless tobacco: Never  Vaping Use   Vaping status: Never Used  Substance and Sexual Activity   Alcohol use: Yes    Alcohol/week: 2.0 standard drinks of alcohol    Types: 2 Glasses of wine per week   Drug use: No   Sexual activity: Not Currently  Other Topics Concern   Not on file   Social History Narrative   Not on file   Social Determinants of Health   Financial Resource Strain: Not on file  Food Insecurity: No Food Insecurity (08/19/2021)   Hunger Vital Sign    Worried About Running Out of Food in the Last Year: Never true    Ran Out of Food in the Last Year: Never true  Transportation Needs: No Transportation Needs (08/19/2021)   PRAPARE - Administrator, Civil Service (Medical): No    Lack of Transportation (Non-Medical): No  Physical Activity: Insufficiently Active (04/02/2018)   Exercise Vital Sign    Days of Exercise per Week: 3 days    Minutes of Exercise per Session: 20 min  Stress: Stress Concern Present (04/02/2018)   [employer] of Occupational Health - Occupational Stress Questionnaire    Feeling of Stress : To some extent  Social Connections: Not on file  Intimate Partner Violence: Not on file      Levert Feinstein, M.D. Ph.D.  [facility] [address] [place], [place] [address] Ph: [phone] Fax: [phone]  CC:  Rebekah Cowboy, MD [address] [place],  [place] [address]  Rebekah Cowboy, MD

## 2023-06-19 NOTE — Telephone Encounter (Signed)
Called and lmtrc: Pt logged into video visit and then left  Trying to reschedule appt to a more feasible time

## 2023-06-22 ENCOUNTER — Ambulatory Visit: Payer: HMO | Admitting: Neurology

## 2023-06-22 ENCOUNTER — Encounter: Payer: Self-pay | Admitting: Neurology

## 2023-06-22 VITALS — BP 122/70 | Ht 64.0 in | Wt 156.0 lb

## 2023-06-22 DIAGNOSIS — M47812 Spondylosis without myelopathy or radiculopathy, cervical region: Secondary | ICD-10-CM | POA: Diagnosis not present

## 2023-06-22 DIAGNOSIS — G8929 Other chronic pain: Secondary | ICD-10-CM

## 2023-06-22 DIAGNOSIS — G3184 Mild cognitive impairment, so stated: Secondary | ICD-10-CM | POA: Diagnosis not present

## 2023-06-22 DIAGNOSIS — M545 Low back pain, unspecified: Secondary | ICD-10-CM

## 2023-06-22 DIAGNOSIS — I679 Cerebrovascular disease, unspecified: Secondary | ICD-10-CM

## 2023-06-22 MED ORDER — UBRELVY 50 MG PO TABS: ORAL_TABLET | ORAL | 11 refills | Status: DC

## 2023-06-22 NOTE — Progress Notes (Signed)
Chief Complaint  Patient presents with   Follow-up    Rm 15, discuss options   ASSESSMENT AND PLAN  Rebekah Hamilton is a 77 y.o. female   Mild cognitive impairment Cerebrovascular disease  MoCA examination 27/30   MRI of brain for evaluation showed significant cerebrovascular disease, evidence of lacunar infarction,  Vascular risk factor of aging, hypertension,  Complete evaluation with echocardiogram, ultrasound of carotid artery, she is to continue aspirin 81 mg daily,  She is no longer a good candidate for triptan treatment for chronic migraine headache, Ubrelvy as needed  She has no strong family history of dementia, her current cognitive complaint likely due to combination of her worsening depression, anxiety, insomnia, cerebrovascular disease,  Suggest her moderate exercise, refer to physical therapy Worsening depression, anxiety, stress,  Refer to psychiatrist  DIAGNOSTIC DATA (LABS, IMAGING, TESTING) - I reviewed patient records, labs, notes, testing and imaging myself where available.   MEDICAL HISTORY:  Rebekah Hamilton, is a 77 year old female seen in request by her primary care doctor Rebekah Hamilton, for evaluation of memory loss initial evaluation May 30, 2023  History is obtained from the patient and review of electronic medical records. I personally reviewed pertinent available imaging films in PACS.   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, underwent a lot of stress in recent years, she lives alone, complains of rapid worsening memory issues, sometimes to the point she could not remember her familiar route while driving  She also reported to lumbar decompression surgery within short period of time in 2021-07-19, she was prolonged or for extended period of time,  In April 2024, she was at her grandson's wedding, she fell, had  headache, decided to 1 back to hotel to take a nap, woke up from sleep, on her way to bathroom, she passed out, hit the edge of the door frame, she woke up on the floor, not sure how long has passed, took her a while to reach her cell phone to call her family for help, was taken to the local hospital,  Since then, she seems to have worsening memory loss, also complains of depression, her mother passed away in 19-Jul-2022, she was the main caregiver of her, she also have strained relationship with her deceased husband, she really worries about her 2 adult children, that live out of state    Laboratory evaluation in January 2024 showed normal TSH, A1c was 5.9, normal vitamin D 33, CBC hemoglobin of 13.3, CMP showed mild elevated creatinine 1.19 with EGFR of 48   Virtual Visit via video UPDATE June 19, 2023 I discussed the limitations of evaluation and management by telemedicine and the availability of in person appointments. The patient expressed understanding and agreed to proceed  Location: Provider: [facility] office; Patient: Home  I connected with Rebekah Hamilton  on June 19, 2023 by a video enabled telemedicine application and verified that I am speaking with the correct person using two identifiers.  UPDATED HiSTORY Virtual visit to review MRI of the brain from June 04, 2023, generalized atrophy, extensive T2/FLAIR hyperintensity signal at cerebral hemisphere, also involving thalamus, pons, more than typical for ages,'s few small superimposed chronic lacunar infarction left thalamus and hemisphere  Fluid in and around the right atlantooccipital joints and atlantoaxial joints, she denies significant neck pain   UPDATE Nov 7th 2024: Patient complains a  lot of stress, tearful during today's visit, her depression anxiety is not under good control, worry about her 2 children, who lives out of state, very better about her previous divorce and legal conflict with her disease to her husband, " I  have lost everything that have worked hard for so many years", not sleeping well, frequent headaches  MRI of the brain in October 2024 showed generalized atrophy, moderate small vessel disease, echocardiogram and ultrasound of carotid is pending for further evaluation, she is on aspirin 81 mg daily  Physical Examinations:   PHYSICAL EXAMNIATION:  Gen: NAD, conversant, well nourised, well groomed                     Cardiovascular: Regular rate rhythm, no peripheral edema, warm, nontender. Eyes: Conjunctivae clear without exudates or hemorrhage Neck: Supple, no carotid bruits. Pulmonary: Clear to auscultation bilaterally   NEUROLOGICAL EXAM:  MENTAL STATUS: Speech/cognition: Awake, alert oriented to history taking and casual conversation  CRANIAL NERVES: CN II: Visual fields are full to confrontation.  Pupils are round equal and briskly reactive to light. CN III, IV, VI: extraocular movement are normal. No ptosis. CN V: Facial sensation is intact to pinprick in all 3 divisions bilaterally. Corneal responses are intact.  CN VII: Face is symmetric with normal eye closure and smile. CN VIII: Hearing is normal to casual conversation CN IX, X: Palate elevates symmetrically. Phonation is normal. CN XI: Head turning and shoulder shrug are intact CN XII: Tongue is midline with normal movements and no atrophy.  MOTOR: There is no pronator drift of out-stretched arms. Muscle bulk and tone are normal. Muscle strength is normal.  REFLEXES: Reflexes are 2 and symmetric at the biceps, triceps, knees, and ankles. Plantar responses are flexor.  SENSORY: Intact to light touch, pinprick, positional and vibratory sensation are intact in fingers and toes.  COORDINATION: Rapid alternating movements and fine finger movements are intact. There is no dysmetria on finger-to-nose and heel-knee-shin.    GAIT/STANCE: Push-up to get up from seated position cautious   REVIEW OF SYSTEMS:  Full 14  system review of systems performed and notable only for as above All other review of systems were negative.   ALLERGIES: Allergies  Allergen Reactions   Augmentin [Amoxicillin-Pot Clavulanate] Diarrhea   Doxycycline Nausea Only   Egg White (Diagnostic) Nausea Only   Prozac [Fluoxetine Hcl] Other (See Comments)    Disoriented    HOME MEDICATIONS: Current Outpatient Medications  Medication Sig Dispense Refill   aspirin-acetaminophen-caffeine (EXCEDRIN EXTRA STRENGTH) 250-250-65 MG tablet Take 1 tablet by mouth daily.     Cholecalciferol (VITAMIN D PO) Take by mouth. Takes 16109 to 12000 units daily.     DULoxetine (CYMBALTA) 30 MG capsule TAKE 3 CAPSULES BY MOUTH DAILY FOR MOOD AND CHRONIC PAIN 270 capsule 3   fexofenadine (ALLEGRA) 180 MG tablet Take 180 mg by mouth daily.     pravastatin (PRAVACHOL) 40 MG tablet Take one tablet at bedtime 90 tablet 3   progesterone (PROMETRIUM) 100 MG capsule Take  1 capsule  Daily 90 capsule 3   ALPRAZolam (XANAX) 0.25 MG tablet Take 1/2 to 1 tablet 2 to 3 x / day ONLY if needed for Acute Anxiety Attacks  & please try to limit to 5 days / week to avoid addiction (Patient not taking: Reported on 06/22/2023) 90 tablet 0   Ascorbic Acid (VITAMIN C PO) 500 mg 2 (two) times daily. (Patient not taking: Reported on 04/05/2023)  buPROPion (WELLBUTRIN XL) 150 MG 24 hr tablet Take 1 tablet every Morning for Mood, Focus & Concentration (Patient not taking: Reported on 04/05/2023) 90 tablet 3   Elastic Bandages & Supports (MEDICAL COMPRESSION STOCKINGS) MISC Knee high compression socks 20-30 pressure, measure and fit (Patient not taking: Reported on 04/27/2023) 2 each 0   estradiol (ESTRACE) 0.5 MG tablet Take  1 tablet  Daily  for Estrogen Deficiency (Patient not taking: Reported on 06/22/2023) 90 tablet 3   gabapentin (NEURONTIN) 300 MG capsule Take 1 capsule 3 x /day for  Pain (Patient not taking: Reported on 12/14/2022) 270 capsule 0   hydrochlorothiazide  (HYDRODIURIL) 25 MG tablet Take 1/2 -1 tablet daily for BP and fluid for goal <130/80. (Patient not taking: Reported on 12/14/2022) 90 tablet 1   phentermine (ADIPEX-P) 37.5 MG tablet Take 1/2 to 1 tablet every Morning for Dieting & Weight Loss (Patient not taking: Reported on 04/05/2023) 90 tablet 1   pseudoephedrine (SUDAFED) 120 MG 12 hr tablet Take  1 tablet  2 x /day (every 12 hours)  for Head and Chest Congestion (Patient not taking: Reported on 12/14/2022) 60 tablet 3   topiramate (TOPAMAX) 50 MG tablet Take 1/2 to 1 tablet 2 x /day at Suppertime & Bedtime for Dieting & Weight Loss (Patient not taking: Reported on 05/30/2023) 180 tablet 1   No current facility-administered medications for this visit.    PAST MEDICAL HISTORY: Past Medical History:  Diagnosis Date   Allergy    Anemia    Anxiety    Depression    Hyperlipidemia    Hypertension    Lumbar stenosis    Migraines    Prediabetes     PAST SURGICAL HISTORY: Past Surgical History:  Procedure Laterality Date   BREAST LUMPECTOMY Bilateral    CHOLECYSTECTOMY  1999   SPINE SURGERY      FAMILY HISTORY: Family History  Problem Relation Age of Onset   Stroke Mother    Hypertension Mother    Heart attack Father 32   Other Maternal Grandmother        blood clot after surgery   Heart attack Paternal Grandmother     SOCIAL HISTORY: Social History   Socioeconomic History   Marital status: Widowed    Spouse name: Not on file   Number of children: 2   Years of education: Not on file   Highest education level: Not on file  Occupational History   Occupation: accountant  Tobacco Use   Smoking status: Never   Smokeless tobacco: Never  Vaping Use   Vaping status: Never Used  Substance and Sexual Activity   Alcohol use: Yes    Alcohol/week: 2.0 standard drinks of alcohol    Types: 2 Glasses of wine per week   Drug use: No   Sexual activity: Not Currently  Other Topics Concern   Not on file  Social History Narrative    Not on file   Social Determinants of Health   Financial Resource Strain: Not on file  Food Insecurity: No Food Insecurity (08/19/2021)   Hunger Vital Sign    Worried About Running Out of Food in the Last Year: Never true    Ran Out of Food in the Last Year: Never true  Transportation Needs: No Transportation Needs (08/19/2021)   PRAPARE - Administrator, Civil Service (Medical): No    Lack of Transportation (Non-Medical): No  Physical Activity: Insufficiently Active (04/02/2018)   Exercise Vital Sign  Days of Exercise per Week: 3 days    Minutes of Exercise per Session: 20 min  Stress: Stress Concern Present (04/02/2018)   [employer] of Occupational Health - Occupational Stress Questionnaire    Feeling of Stress : To some extent  Social Connections: Not on file  Intimate Partner Violence: Not on file      Levert Feinstein, M.D. Ph.D.  [facility] [address], [address] [place], [place] [address] Ph: [phone] Fax: [phone]  CC:  Rebekah Cowboy, MD [address] [place],  [place] [address]  Rebekah Cowboy, MD
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
## 2023-06-28 ENCOUNTER — Encounter: Payer: Self-pay | Admitting: Nurse Practitioner

## 2023-06-28 ENCOUNTER — Ambulatory Visit (INDEPENDENT_AMBULATORY_CARE_PROVIDER_SITE_OTHER): Payer: HMO | Admitting: Nurse Practitioner

## 2023-06-28 VITALS — BP 148/62 | HR 73 | Temp 97.7°F | Ht 62.5 in | Wt 157.8 lb

## 2023-06-28 DIAGNOSIS — E559 Vitamin D deficiency, unspecified: Secondary | ICD-10-CM | POA: Diagnosis not present

## 2023-06-28 DIAGNOSIS — R7309 Other abnormal glucose: Secondary | ICD-10-CM

## 2023-06-28 DIAGNOSIS — Z1329 Encounter for screening for other suspected endocrine disorder: Secondary | ICD-10-CM

## 2023-06-28 DIAGNOSIS — F331 Major depressive disorder, recurrent, moderate: Secondary | ICD-10-CM

## 2023-06-28 DIAGNOSIS — T7840XS Allergy, unspecified, sequela: Secondary | ICD-10-CM

## 2023-06-28 DIAGNOSIS — Z79899 Other long term (current) drug therapy: Secondary | ICD-10-CM | POA: Diagnosis not present

## 2023-06-28 DIAGNOSIS — Z136 Encounter for screening for cardiovascular disorders: Secondary | ICD-10-CM | POA: Diagnosis not present

## 2023-06-28 DIAGNOSIS — Z0000 Encounter for general adult medical examination without abnormal findings: Secondary | ICD-10-CM | POA: Diagnosis not present

## 2023-06-28 DIAGNOSIS — I10 Essential (primary) hypertension: Secondary | ICD-10-CM | POA: Diagnosis not present

## 2023-06-28 DIAGNOSIS — E782 Mixed hyperlipidemia: Secondary | ICD-10-CM | POA: Diagnosis not present

## 2023-06-28 DIAGNOSIS — E663 Overweight: Secondary | ICD-10-CM

## 2023-06-28 DIAGNOSIS — M48062 Spinal stenosis, lumbar region with neurogenic claudication: Secondary | ICD-10-CM

## 2023-06-28 DIAGNOSIS — Z1389 Encounter for screening for other disorder: Secondary | ICD-10-CM

## 2023-06-28 DIAGNOSIS — H409 Unspecified glaucoma: Secondary | ICD-10-CM

## 2023-06-28 DIAGNOSIS — N1831 Chronic kidney disease, stage 3a: Secondary | ICD-10-CM

## 2023-06-28 DIAGNOSIS — I700 Atherosclerosis of aorta: Secondary | ICD-10-CM | POA: Diagnosis not present

## 2023-06-28 DIAGNOSIS — I872 Venous insufficiency (chronic) (peripheral): Secondary | ICD-10-CM

## 2023-06-28 DIAGNOSIS — G43009 Migraine without aura, not intractable, without status migrainosus: Secondary | ICD-10-CM

## 2023-06-28 DIAGNOSIS — I679 Cerebrovascular disease, unspecified: Secondary | ICD-10-CM

## 2023-06-28 DIAGNOSIS — G3184 Mild cognitive impairment, so stated: Secondary | ICD-10-CM

## 2023-06-28 DIAGNOSIS — Z0001 Encounter for general adult medical examination with abnormal findings: Secondary | ICD-10-CM

## 2023-06-28 DIAGNOSIS — Z6827 Body mass index (BMI) 27.0-27.9, adult: Secondary | ICD-10-CM

## 2023-06-28 DIAGNOSIS — N3281 Overactive bladder: Secondary | ICD-10-CM

## 2023-06-28 DIAGNOSIS — F419 Anxiety disorder, unspecified: Secondary | ICD-10-CM

## 2023-06-28 MED ORDER — HYDROCHLOROTHIAZIDE 25 MG PO TABS: ORAL_TABLET | ORAL | 1 refills | Status: AC

## 2023-06-28 MED ORDER — BUPROPION HCL ER (XL) 150 MG PO TB24: ORAL_TABLET | ORAL | 3 refills | Status: DC

## 2023-06-28 NOTE — Patient Instructions (Addendum)
Referral has been made to psych by Dr. Verneita Griffes Wellbutrin 150 mg every day Encourage to establish with a therapist  Call for PT ordered by Dr. Terrace Arabia for leg weakness  Restart hydrochlorothiazide 25 mg every day for blood pressure  Managing Depression, Adult Depression is a mental health condition that affects your thoughts, feelings, and actions. Being diagnosed with depression can bring you relief if you did not know why you have felt or behaved a certain way. It could also leave you feeling overwhelmed. Finding ways to manage your symptoms can help you feel more positive about your future. How to manage lifestyle changes Being depressed is difficult. Depression can increase the level of everyday stress. Stress can make depression symptoms worse. You may believe your symptoms cannot be managed or will never improve. However, there are many things you can try to help manage your symptoms. There is hope. Managing stress  Stress is your body's reaction to life changes and events, both good and bad. Stress can add to your feelings of depression. Learning to manage your stress can help lessen your feelings of depression. Try some of the following approaches to reducing your stress (stress reduction techniques): Listen to music that you enjoy and that inspires you. Try using a meditation app or take a meditation class. Develop a practice that helps you connect with your spiritual self. Walk in nature, pray, or go to a place of worship. Practice deep breathing. To do this, inhale slowly through your nose. Pause at the top of your inhale for a few seconds and then exhale slowly, letting yourself relax. Repeat this three or four times. Practice yoga to help relax and work your muscles. Choose a stress reduction technique that works for you. These techniques take time and practice to develop. Set aside 5-15 minutes a day to do them. Therapists can offer training in these techniques. Do these things to  help manage stress: Keep a journal. Know your limits. Set healthy boundaries for yourself and others, such as saying "no" when you think something is too much. Pay attention to how you react to certain situations. You may not be able to control everything, but you can change your reaction. Add humor to your life by watching funny movies or shows. Make time for activities that you enjoy and that relax you. Spend less time using electronics, especially at night before bed. The light from screens can make your brain think it is time to get up rather than go to bed.  Medicines Medicines, such as antidepressants, are often a part of treatment for depression. Talk with your pharmacist or health care provider about all the medicines, supplements, and herbal products that you take, their possible side effects, and what medicines and other products are safe to take together. Make sure to report any side effects you may have to your health care provider. Relationships Your health care provider may suggest family therapy, couples therapy, or individual therapy as part of your treatment. How to recognize changes Everyone responds differently to treatment for depression. As you recover from depression, you may start to: Have more interest in doing activities. Feel more hopeful. Have more energy. Eat a more regular amount of food. Have better mental focus. It is important to recognize if your depression is not getting better or is getting worse. The symptoms you had in the beginning may return, such as: Feeling tired. Eating too much or too little. Sleeping too much or too little. Feeling restless, agitated, or  hopeless. Trouble focusing or making decisions. Having unexplained aches and pains. Feeling irritable, angry, or aggressive. If you or your family members notice these symptoms coming back, let your health care provider know right away. Follow these instructions at home: Activity Try to get  some form of exercise each day, such as walking. Try yoga, mindfulness, or other stress reduction techniques. Participate in group activities if you are able. Lifestyle Get enough sleep. Cut down on or stop using caffeine, tobacco, alcohol, and any other harmful substances. Eat a healthy diet that includes plenty of vegetables, fruits, whole grains, low-fat dairy products, and lean protein. Limit foods that are high in solid fats, added sugar, or salt (sodium). General instructions Take over-the-counter and prescription medicines only as told by your health care provider. Keep all follow-up visits. It is important for your health care provider to check on your mood, behavior, and medicines. Your health care provider may need to make changes to your treatment. Where to find support Talking to others  Friends and family members can be sources of support and guidance. Talk to trusted friends or family members about your condition. Explain your symptoms and let them know that you are working with a health care provider to treat your depression. Tell friends and family how they can help. Finances Find mental health providers that fit with your financial situation. Talk with your health care provider if you are worried about access to food, housing, or medicine. Call your insurance company to learn about your co-pays and prescription plan. Where to find more information You can find support in your area from: Anxiety and Depression Association of America (ADAA): adaa.org Mental Health America: mentalhealthamerica.net [employer] on Mental Illness: nami.org Contact a health care provider if: You stop taking your antidepressant medicines, and you have any of these symptoms: Nausea. Headache. Light-headedness. Chills and body aches. Not being able to sleep (insomnia). You or your friends and family think your depression is getting worse. Get help right away if: You have thoughts of  hurting yourself or others. Get help right away if you feel like you may hurt yourself or others, or have thoughts about taking your own life. Go to your nearest emergency room or: Call 911. Call the National Suicide Prevention Lifeline at 810-254-7656 or 988. This is open 24 hours a day. Text the Crisis Text Line at 308-792-2895. This information is not intended to replace advice given to you by your health care provider. Make sure you discuss any questions you have with your health care provider. Document Revised: 12/07/2021 Document Reviewed: 12/07/2021 Elsevier Patient Education  2024 [employer].

## 2023-06-29 LAB — COMPLETE METABOLIC PANEL WITH GFR
AG Ratio: 2.1 (calc) (ref 1.0–2.5)
ALT: 16 U/L (ref 6–29)
AST: 19 U/L (ref 10–35)
Albumin: 4.6 g/dL (ref 3.6–5.1)
Alkaline phosphatase (APISO): 79 U/L (ref 37–153)
BUN/Creatinine Ratio: 19 (calc) (ref 6–22)
BUN: 20 mg/dL (ref 7–25)
CO2: 30 mmol/L (ref 20–32)
Calcium: 10.1 mg/dL (ref 8.6–10.4)
Chloride: 106 mmol/L (ref 98–110)
Creat: 1.04 mg/dL — ABNORMAL HIGH (ref 0.60–1.00)
Globulin: 2.2 g/dL (ref 1.9–3.7)
Glucose, Bld: 95 mg/dL (ref 65–99)
Potassium: 4.5 mmol/L (ref 3.5–5.3)
Sodium: 145 mmol/L (ref 135–146)
Total Bilirubin: 0.4 mg/dL (ref 0.2–1.2)
Total Protein: 6.8 g/dL (ref 6.1–8.1)
eGFR: 56 mL/min/{1.73_m2} — ABNORMAL LOW (ref >=60)

## 2023-06-29 LAB — CBC WITH DIFFERENTIAL/PLATELET
Absolute Lymphocytes: 2734 {cells}/uL (ref 850–3900)
Absolute Monocytes: 828 {cells}/uL (ref 200–950)
Basophils Absolute: 74 {cells}/uL (ref 0–200)
Basophils Relative: 0.8 %
Eosinophils Absolute: 177 {cells}/uL (ref 15–500)
Eosinophils Relative: 1.9 %
HCT: 43.8 % (ref 35.0–45.0)
Hemoglobin: 14.1 g/dL (ref 11.7–15.5)
MCH: 30 pg (ref 27.0–33.0)
MCHC: 32.2 g/dL (ref 32.0–36.0)
MCV: 93.2 fL (ref 80.0–100.0)
MPV: 10.7 fL (ref 7.5–12.5)
Monocytes Relative: 8.9 %
Neutro Abs: 5487 {cells}/uL (ref 1500–7800)
Neutrophils Relative %: 59 %
Platelets: 321 10*3/uL (ref 140–400)
RBC: 4.7 10*6/uL (ref 3.80–5.10)
RDW: 12.4 % (ref 11.0–15.0)
Total Lymphocyte: 29.4 %
WBC: 9.3 10*3/uL (ref 3.8–10.8)

## 2023-06-29 LAB — MICROALBUMIN / CREATININE URINE RATIO
Creatinine, Urine: 200 mg/dL (ref 20–275)
Microalb Creat Ratio: 7 mg/g{creat} (ref <30)
Microalb, Ur: 1.4 mg/dL

## 2023-06-29 LAB — URINALYSIS, ROUTINE W REFLEX MICROSCOPIC
Bilirubin Urine: NEGATIVE
Glucose, UA: NEGATIVE
Hgb urine dipstick: NEGATIVE
Hyaline Cast: NONE SEEN /[LPF]
Nitrite: NEGATIVE
Protein, ur: NEGATIVE
Specific Gravity, Urine: 1.027 (ref 1.001–1.035)
pH: <=5 (ref 5.0–8.0)

## 2023-06-29 LAB — MAGNESIUM: Magnesium: 2.2 mg/dL (ref 1.5–2.5)

## 2023-06-29 LAB — LIPID PANEL
Cholesterol: 140 mg/dL (ref <200)
HDL: 45 mg/dL — ABNORMAL LOW (ref >=50)
LDL Cholesterol (Calc): 74 mg/dL
Non-HDL Cholesterol (Calc): 95 mg/dL (ref <130)
Total CHOL/HDL Ratio: 3.1 (calc) (ref <5.0)
Triglycerides: 126 mg/dL (ref <150)

## 2023-06-29 LAB — VITAMIN D 25 HYDROXY (VIT D DEFICIENCY, FRACTURES): Vit D, 25-Hydroxy: 34 ng/mL (ref 30–100)

## 2023-06-29 LAB — TSH: TSH: 2.39 m[IU]/L (ref 0.40–4.50)

## 2023-06-29 LAB — HEMOGLOBIN A1C W/OUT EAG: Hgb A1c MFr Bld: 5.6 %{Hb} (ref <5.7)

## 2023-07-05 ENCOUNTER — Ambulatory Visit
Admission: RE | Admit: 2023-07-05 | Discharge: 2023-07-05 | Disposition: A | Discharge status: Home / Self Care | Payer: HMO | Source: Ambulatory Visit | Attending: Nurse Practitioner | Admitting: Nurse Practitioner

## 2023-07-05 DIAGNOSIS — E2839 Other primary ovarian failure: Secondary | ICD-10-CM

## 2023-07-05 DIAGNOSIS — N958 Other specified menopausal and perimenopausal disorders: Secondary | ICD-10-CM | POA: Diagnosis not present

## 2023-07-05 DIAGNOSIS — M8588 Other specified disorders of bone density and structure, other site: Secondary | ICD-10-CM | POA: Diagnosis not present

## 2023-08-21 ENCOUNTER — Ambulatory Visit (HOSPITAL_COMMUNITY)
Admission: RE | Admit: 2023-08-21 | Discharge: 2023-08-21 | Disposition: A | Discharge status: Home / Self Care | Payer: HMO | Source: Ambulatory Visit | Attending: Neurology | Admitting: Neurology

## 2023-08-21 DIAGNOSIS — R413 Other amnesia: Secondary | ICD-10-CM | POA: Diagnosis not present

## 2023-08-21 DIAGNOSIS — I340 Nonrheumatic mitral (valve) insufficiency: Secondary | ICD-10-CM | POA: Insufficient documentation

## 2023-08-21 DIAGNOSIS — I10 Essential (primary) hypertension: Secondary | ICD-10-CM | POA: Insufficient documentation

## 2023-08-21 DIAGNOSIS — G3184 Mild cognitive impairment, so stated: Secondary | ICD-10-CM

## 2023-08-21 DIAGNOSIS — I679 Cerebrovascular disease, unspecified: Secondary | ICD-10-CM | POA: Diagnosis not present

## 2023-08-21 DIAGNOSIS — E785 Hyperlipidemia, unspecified: Secondary | ICD-10-CM | POA: Diagnosis not present

## 2023-08-21 DIAGNOSIS — R42 Dizziness and giddiness: Secondary | ICD-10-CM

## 2023-08-21 DIAGNOSIS — G459 Transient cerebral ischemic attack, unspecified: Secondary | ICD-10-CM | POA: Diagnosis not present

## 2023-08-21 LAB — ECHOCARDIOGRAM COMPLETE
Area-P 1/2: 3.01 cm2
Calc EF: 66.6 %
S' Lateral: 2.6 cm
Single Plane A2C EF: 68.2 %
Single Plane A4C EF: 65.9 %

## 2023-08-21 NOTE — Progress Notes (Signed)
  Echocardiogram 2D Echocardiogram has been performed.  Janalyn Harder 08/21/2023, 1:35 PM

## 2023-09-05 ENCOUNTER — Ambulatory Visit: Payer: HMO | Attending: Neurology | Admitting: Physical Therapy

## 2023-09-05 DIAGNOSIS — G3184 Mild cognitive impairment, so stated: Secondary | ICD-10-CM | POA: Diagnosis not present

## 2023-09-05 DIAGNOSIS — M542 Cervicalgia: Secondary | ICD-10-CM | POA: Insufficient documentation

## 2023-09-05 DIAGNOSIS — M6281 Muscle weakness (generalized): Secondary | ICD-10-CM | POA: Insufficient documentation

## 2023-09-05 DIAGNOSIS — G8929 Other chronic pain: Secondary | ICD-10-CM | POA: Diagnosis present

## 2023-09-05 DIAGNOSIS — M5442 Lumbago with sciatica, left side: Secondary | ICD-10-CM | POA: Diagnosis present

## 2023-09-05 DIAGNOSIS — M5459 Other low back pain: Secondary | ICD-10-CM | POA: Insufficient documentation

## 2023-09-05 DIAGNOSIS — M25551 Pain in right hip: Secondary | ICD-10-CM | POA: Insufficient documentation

## 2023-09-05 DIAGNOSIS — I679 Cerebrovascular disease, unspecified: Secondary | ICD-10-CM | POA: Insufficient documentation

## 2023-09-05 DIAGNOSIS — R2681 Unsteadiness on feet: Secondary | ICD-10-CM | POA: Diagnosis present

## 2023-09-05 DIAGNOSIS — M47812 Spondylosis without myelopathy or radiculopathy, cervical region: Secondary | ICD-10-CM | POA: Insufficient documentation

## 2023-09-05 DIAGNOSIS — M545 Low back pain, unspecified: Secondary | ICD-10-CM | POA: Diagnosis not present

## 2023-09-05 DIAGNOSIS — R262 Difficulty in walking, not elsewhere classified: Secondary | ICD-10-CM | POA: Insufficient documentation

## 2023-09-05 NOTE — Therapy (Signed)
OUTPATIENT PHYSICAL THERAPY THORACOLUMBAR EVALUATION   Patient Name: Rebekah Hamilton MRN: [id number] DOB:August 20, 1945, 78 y.o., female Today's Date: 09/05/2023  END OF SESSION:  PT End of Session - 09/05/23 1452     Visit Number 1    Number of Visits 24    Date for PT Re-Evaluation 11/28/23    Progress Note Due on Visit 10    PT Start Time 1447    PT Stop Time 1528    PT Time Calculation (min) 41 min    Activity Tolerance Patient tolerated treatment well             Past Medical History:  Diagnosis Date   Allergy    Anemia    Anxiety    Depression    Hyperlipidemia    Hypertension    Lumbar stenosis    Migraines    Prediabetes    Past Surgical History:  Procedure Laterality Date   BREAST LUMPECTOMY Bilateral    CHOLECYSTECTOMY  1999   SPINE SURGERY     Patient Active Problem List   Diagnosis Date Noted   Cervical spondylosis 06/22/2023   Cerebral vascular disease 06/19/2023   Mild cognitive impairment 05/30/2023   Overweight with body mass index (BMI) of 28 to 28.9 in adult 12/05/2022   Postural dizziness with presyncope 12/05/2022   S/P lumbar fusion 11/12/2020   Chronic venous insufficiency 05/07/2019   History of 2019 novel coronavirus disease (COVID-19) 05/07/2019   Low back pain without sciatica 07/02/2018   OAB (overactive bladder) 04/02/2018   CKD (chronic kidney disease), symptom management only, stage 2 (mild) 09/24/2017   Glaucoma 06/02/2015   Vitamin D deficiency 10/21/2014   Hyperlipidemia, mixed    Essential hypertension    Abnormal glucose    Migraines    Allergy    Anxiety    Depression, major, recurrent (HCC)    Lumbar stenosis     PCP: [name], MD   REFERRING PROVIDER:   Levert Feinstein, MD    REFERRING DIAG:  G31.84 (ICD-10-CM) - Mild cognitive impairment  I67.9 (ICD-10-CM) - Cerebral vascular disease  M54.50 (ICD-10-CM) - Low back pain without sciatica, unspecified back pain laterality, unspecified chronicity   M54.50,G89.29 (ICD-10-CM) - Chronic low back pain without sciatica, unspecified back pain laterality  M47.812 (ICD-10-CM) - Cervical spondylosis    Rationale for Evaluation and Treatment: Rehabilitation  THERAPY DIAG:  Other low back pain - Plan: PT plan of care cert/re-cert  Cervicalgia - Plan: PT plan of care cert/re-cert  Muscle weakness (generalized) - Plan: PT plan of care cert/re-cert  ONSET DATE: 02/03/23  SUBJECTIVE:  SUBJECTIVE STATEMENT: Pt reports the pain in her arm started 6 months ago. Pt then had pain in her leg 3 months. Pt describes her pain as an ache. They do not ache at the same time though. Pt reports there is no known pattern to her pain. Pt has more pain in flexion positions as best as her history allows author to discern. Pt feels discomfort in her back worse than other areas.    PERTINENT HISTORY:  HLD, HTN, anxiety, chronic kidney disease, chronic back pain, lumbar fusion, mild cognitive impairment  PAIN:  Are you having pain? Yes: NPRS scale: 6 Pain location: L ARM R LE Pain description: ache Aggravating factors: unsure Relieving factors: unsure  PRECAUTIONS: None  RED FLAGS: None   WEIGHT BEARING RESTRICTIONS: No  FALLS:  Has patient fallen in last 6 months? No  LIVING ENVIRONMENT: Lives with: lives alone Lives in: House/apartment Stairs: Yes: Internal: 4 steps; on right going up Has following equipment at home: Single point cane, Walker - 2 wheeled, and shower chair  OCCUPATION: retired   PLOF: Independent  PATIENT GOALS: Improve pain in her leg and arm   NEXT MD VISIT: Ask next visit   OBJECTIVE:  Note: Objective measures were completed at Evaluation unless otherwise noted.  DIAGNOSTIC FINDINGS:  N/a  PATIENT SURVEYS:  Modified Oswestry 12/50   FOTO 53  COGNITION: Overall cognitive status:  Mild cognitive impairment to listed in chart, patient has difficulty following along with questions and answering some questions specifically      SENSATION: WFL  MUSCLE LENGTH: Hamstrings: Right 45 degrees deg; Left 35 degrees deg Thomas test: Right WNL deg; Left WNL deg  POSTURE: rounded shoulders, forward head, and decreased lumbar lordosis  PALPATION: Not assessed   LUMBAR ROM:   AROM eval  Flexion Hands to knees  Extension min  Right lateral flexion To knee lateral  Left lateral flexion To knee lateral  Right rotation   Left rotation    (Blank rows = not tested)    LOWER EXTREMITY MMT:    MMT Right eval Left eval  Hip flexion 4 4  Hip extension    Hip abduction 4 4  Hip adduction 4 4  Hip internal rotation    Hip external rotation    Knee flexion 4 4  Knee extension 4 4  Ankle dorsiflexion 4 4  Ankle plantarflexion    Ankle inversion    Ankle eversion     (Blank rows = not tested)  LUMBAR SPECIAL TESTS:  Straight leg raise test: Positive, FABER test: Negative, and Double leg lowering test negative   FUNCTIONAL TESTS:  5 times sit to stand: 15.53 sec    TREATMENT DATE: 09/05/23 Eval only                                                                                                                                  PATIENT EDUCATION:  Education details:  POC Person educated: Patient Education method: Explanation Education comprehension: verbalized understanding   HOME EXERCISE PROGRAM: Establish visit 2    ASSESSMENT:  CLINICAL IMPRESSION: Patient is a 78 y.o. F who was seen today for physical therapy evaluation and treatment for pain in her right leg leg from suspected radiculopathy and pain in her left upper extremity from radiculopathy suspected.  Patient presents with moderate deficits in lumbar flexibility and significant deficits in lower extremity muscular strength.  Upper extremity  symptoms were not able to be assessed this date and will be further assessed at next treatment session.  Patient demonstrates impairments as evidenced by modified Oswestry disability questionnaire, focus on therapeutic outcomes questionnaire, objective testing, as well as muscular stiffness noted in her hamstrings bilaterally but with more stiffness on the right compared to the left.  Patient will benefit from skilled physical therapy to address the above impairments, improve her pain, and improve her quality of life.  OBJECTIVE IMPAIRMENTS: decreased activity tolerance, decreased strength, hypomobility, impaired perceived functional ability, impaired UE functional use, and pain.   ACTIVITY LIMITATIONS: carrying, lifting, squatting, and locomotion level  PARTICIPATION LIMITATIONS: driving, shopping, and community activity  PERSONAL FACTORS: Age and 3+ comorbidities: HLD, HTN, anxiety, depression, chronic issues with low back pain  are also affecting patient's functional outcome.   REHAB POTENTIAL: Fair unsure of radicular causes of pain, especially at different spinal levels  CLINICAL DECISION MAKING: Evolving/moderate complexity  EVALUATION COMPLEXITY: Moderate   GOALS: Goals reviewed with patient? Yes  SHORT TERM GOALS: Target date: 09/26/2023      Patient will be independent in home exercise program to improve strength/mobility for better functional independence with ADLs. Baseline: No HEP currently  Goal status: INITIAL  LONG TERM GOALS: Target date: 11/28/2023  1.  Patient (> 84 years old) will complete five times sit to stand test in < 12 seconds indicating an increased LE strength and power. Baseline: 15.5 sec with UE suppor ton 2 reps  Goal status: INITIAL  2.  Patient will increase FOTO score to equal to or greater than  57   to demonstrate statistically significant improvement in mobility and quality of life.  Baseline: 53 Goal status: INITIAL   3.  Patient will  increase MODI questionnaire by 6 points or greater to indicate improved subjective level of physical function as a result of her back and leg pain  Baseline: 12 Goal status: INITIAL  4.  Patient will improve lower extremity strength in all major planes to 4+ out of 5 or greater particularly in her hip and knee musculature in order to indicate improvement in her lower extremity strength and stability Baseline: See eval Goal status: INITIAL       PLAN:  PT FREQUENCY: 1-2x/week  PT DURATION: 12 weeks  PLANNED INTERVENTIONS: 97110-Therapeutic exercises, 97530- Therapeutic activity, 97112- Neuromuscular re-education, 97535- Self Care, 29528- Manual therapy, Balance training, Dry Needling, and Moist heat.  PLAN FOR NEXT SESSION: Assess left upper extremity, provided with home exercise program for cervical and lumbar symptoms   Norman Herrlich, PT 09/05/2023, 5:16 PM

## 2023-09-07 ENCOUNTER — Ambulatory Visit: Payer: No Typology Code available for payment source | Admitting: Physical Therapy

## 2023-09-07 DIAGNOSIS — M5459 Other low back pain: Secondary | ICD-10-CM | POA: Diagnosis not present

## 2023-09-07 DIAGNOSIS — M6281 Muscle weakness (generalized): Secondary | ICD-10-CM

## 2023-09-07 DIAGNOSIS — R262 Difficulty in walking, not elsewhere classified: Secondary | ICD-10-CM

## 2023-09-07 DIAGNOSIS — M542 Cervicalgia: Secondary | ICD-10-CM

## 2023-09-07 NOTE — Therapy (Signed)
OUTPATIENT PHYSICAL THERAPY THORACOLUMBAR AND CERVICAL TREATMENT   Patient Name: Rebekah Hamilton MRN: [id number] DOB:1945/10/31, 78 y.o., female Today's Date: 09/07/2023  END OF SESSION:  PT End of Session - 09/07/23 1007     Visit Number 2    Number of Visits 24    Date for PT Re-Evaluation 11/28/23    Progress Note Due on Visit 10    PT Start Time 1015    PT Stop Time 1057    PT Time Calculation (min) 42 min    Activity Tolerance Patient tolerated treatment well    Behavior During Therapy WFL for tasks assessed/performed              Past Medical History:  Diagnosis Date   Allergy    Anemia    Anxiety    Depression    Hyperlipidemia    Hypertension    Lumbar stenosis    Migraines    Prediabetes    Past Surgical History:  Procedure Laterality Date   BREAST LUMPECTOMY Bilateral    CHOLECYSTECTOMY  1999   SPINE SURGERY     Patient Active Problem List   Diagnosis Date Noted   Cervical spondylosis 06/22/2023   Cerebral vascular disease 06/19/2023   Mild cognitive impairment 05/30/2023   Overweight with body mass index (BMI) of 28 to 28.9 in adult 12/05/2022   Postural dizziness with presyncope 12/05/2022   S/P lumbar fusion 11/12/2020   Chronic venous insufficiency 05/07/2019   History of 2019 novel coronavirus disease (COVID-19) 05/07/2019   Low back pain without sciatica 07/02/2018   OAB (overactive bladder) 04/02/2018   CKD (chronic kidney disease), symptom management only, stage 2 (mild) 09/24/2017   Glaucoma 06/02/2015   Vitamin D deficiency 10/21/2014   Hyperlipidemia, mixed    Essential hypertension    Abnormal glucose    Migraines    Allergy    Anxiety    Depression, major, recurrent (HCC)    Lumbar stenosis     PCP: [name], MD   REFERRING PROVIDER:   Levert Feinstein, MD    REFERRING DIAG:  G31.84 (ICD-10-CM) - Mild cognitive impairment  I67.9 (ICD-10-CM) - Cerebral vascular disease  M54.50 (ICD-10-CM) - Low back pain  without sciatica, unspecified back pain laterality, unspecified chronicity  M54.50,G89.29 (ICD-10-CM) - Chronic low back pain without sciatica, unspecified back pain laterality  M47.812 (ICD-10-CM) - Cervical spondylosis    Rationale for Evaluation and Treatment: Rehabilitation  THERAPY DIAG:  Other low back pain  Cervicalgia  Muscle weakness (generalized)  Difficulty in walking, not elsewhere classified  ONSET DATE: 02/03/23  SUBJECTIVE:  SUBJECTIVE STATEMENT:  Pt reports arm is aching today.  Pt points to her left lateral and posterior aspect of her shoulder that is the most painful area.   PERTINENT HISTORY:  HLD, HTN, anxiety, chronic kidney disease, chronic back pain, lumbar fusion, mild cognitive impairment  PAIN:  Are you having pain? Yes: NPRS scale: 5-6 Pain location: L ARM R LE Pain description: ache Aggravating factors: unsure Relieving factors: unsure  PRECAUTIONS: None  RED FLAGS: None   WEIGHT BEARING RESTRICTIONS: No  FALLS:  Has patient fallen in last 6 months? No  LIVING ENVIRONMENT: Lives with: lives alone Lives in: House/apartment Stairs: Yes: Internal: 4 steps; on right going up Has following equipment at home: Single point cane, Walker - 2 wheeled, and shower chair  OCCUPATION: retired   PLOF: Independent  PATIENT GOALS: Improve pain in her leg and arm   NEXT MD VISIT: Ask next visit   OBJECTIVE:  Note: Objective measures were completed at Evaluation unless otherwise noted.  DIAGNOSTIC FINDINGS:  N/a  PATIENT SURVEYS:  Modified Oswestry 12/50  FOTO 53  COGNITION: Overall cognitive status:  Mild cognitive impairment to listed in chart, patient has difficulty following along with questions and answering some questions specifically       SENSATION: WFL  MUSCLE LENGTH: Hamstrings: Right 45 degrees deg; Left 35 degrees deg Thomas test: Right WNL deg; Left WNL deg  POSTURE: rounded shoulders, forward head, and decreased lumbar lordosis  PALPATION: Not assessed   LUMBAR ROM:   AROM eval  Flexion Hands to knees  Extension min  Right lateral flexion To knee lateral  Left lateral flexion To knee lateral  Right rotation   Left rotation    (Blank rows = not tested)    LOWER EXTREMITY MMT:    MMT Right eval Left eval  Hip flexion 4 4  Hip extension    Hip abduction 4 4  Hip adduction 4 4  Hip internal rotation    Hip external rotation    Knee flexion 4 4  Knee extension 4 4  Ankle dorsiflexion 4 4  Ankle plantarflexion    Ankle inversion    Ankle eversion     (Blank rows = not tested)  LUMBAR SPECIAL TESTS:  Straight leg raise test: Positive, FABER test: Negative, and Double leg lowering test negative   FUNCTIONAL TESTS:  5 times sit to stand: 15.53 sec    TREATMENT DATE: 09/07/23  Began with brief assessment of shoulder and neck pain as below  UE ROM: Pain with abduction to end range, WNL motion  Special tests: positive empty can and hawkin's kennedy tests  Strength: 4+/5 on R ER and IR 4/5 L IE, 3+/5 L ER, 3+/5 L flexion and abduction  Neck assessment:  Pain with right lateral passive flexion and rotation Improvement in pain with suboccipital release     Manual for cervical  Bilateral rotation stretch 2 x 45 sec  Bilateral side bending stretch 2 x 45 sec  Suboccipital release:  2 x 45 sec , relieves pain  TE for HEP initiation  Lower trunk rotation 10 x 5 sec hold ea LE  Chin tucks supine x 10 x 5 sec hold  Seated scapular retraction 2 x 10  Seated shoulder rolls 2 x 10   Education on probable injuries ( shoulder involvement, cervical radiculopathy causes, etc.   PATIENT EDUCATION:  Education details: POC Person educated: Patient Education method: Explanation Education  comprehension: verbalized understanding   HOME EXERCISE PROGRAM: Access  Code: [email or web address] URL: [email or web address] Date: 09/07/2023 Prepared by: Thresa Ross  Exercises - Supine Lower Trunk Rotation  - 1 x daily - 7 x weekly - 1 sets - 10 reps - 5 sec hold - Supine Chin Tuck  - 2 x daily - 7 x weekly - 2 sets - 10 reps - 5 sec hold - Seated Scapular Retraction  - 2 x daily - 7 x weekly - 2 sets - 10 reps - Seated Shoulder Rolls  - 1 x daily - 7 x weekly - 2 sets - 10 reps   ASSESSMENT:  CLINICAL IMPRESSION: Patient arrived with good motivation for completion of pt activities. Pt evaluated for her shoulder this date and shows positive findings for cervical radiculopathy with some scapular involvement.  Patient responded positively to cervical distraction and suboccipital release this date stating improvement in her symptoms and discomfort.  Patient will benefit from skilled physical therapy to address her low back and lower extremity radiculopathy as well as her cervical and left upper extremity pain and discomfort.  OBJECTIVE IMPAIRMENTS: decreased activity tolerance, decreased strength, hypomobility, impaired perceived functional ability, impaired UE functional use, and pain.   ACTIVITY LIMITATIONS: carrying, lifting, squatting, and locomotion level  PARTICIPATION LIMITATIONS: driving, shopping, and community activity  PERSONAL FACTORS: Age and 3+ comorbidities: HLD, HTN, anxiety, depression, chronic issues with low back pain  are also affecting patient's functional outcome.   REHAB POTENTIAL: Fair unsure of radicular causes of pain, especially at different spinal levels  CLINICAL DECISION MAKING: Evolving/moderate complexity  EVALUATION COMPLEXITY: Moderate   GOALS: Goals reviewed with patient? Yes  SHORT TERM GOALS: Target date: 09/26/2023    Patient will be independent in home exercise program to improve strength/mobility for better functional  independence with ADLs. Baseline: No HEP currently  Goal status: INITIAL  LONG TERM GOALS: Target date: 11/28/2023  1.  Patient (> 5 years old) will complete five times sit to stand test in < 12 seconds indicating an increased LE strength and power. Baseline: 15.5 sec with UE suppor ton 2 reps  Goal status: INITIAL  2.  Patient will increase FOTO score to equal to or greater than  57   to demonstrate statistically significant improvement in mobility and quality of life.  Baseline: 53 Goal status: INITIAL   3.  Patient will increase MODI questionnaire by 6 points or greater to indicate improved subjective level of physical function as a result of her back and leg pain  Baseline: 12 Goal status: INITIAL  4.  Patient will improve lower extremity strength in all major planes to 4+ out of 5 or greater particularly in her hip and knee musculature in order to indicate improvement in her lower extremity strength and stability Baseline: See eval Goal status: INITIAL       PLAN:  PT FREQUENCY: 1-2x/week  PT DURATION: 12 weeks  PLANNED INTERVENTIONS: 97110-Therapeutic exercises, 97530- Therapeutic activity, 97112- Neuromuscular re-education, 97535- Self Care, 62130- Manual therapy, Balance training, Dry Needling, and Moist heat.  PLAN FOR NEXT SESSION: cervical and lumbar radiculopathy treatment, L UE scapular and impingement involvement.    Norman Herrlich, PT 09/07/2023, 10:08 AM

## 2023-09-12 ENCOUNTER — Ambulatory Visit: Payer: No Typology Code available for payment source

## 2023-09-12 DIAGNOSIS — M5459 Other low back pain: Secondary | ICD-10-CM

## 2023-09-12 DIAGNOSIS — M542 Cervicalgia: Secondary | ICD-10-CM

## 2023-09-12 DIAGNOSIS — M6281 Muscle weakness (generalized): Secondary | ICD-10-CM

## 2023-09-12 DIAGNOSIS — R262 Difficulty in walking, not elsewhere classified: Secondary | ICD-10-CM

## 2023-09-12 DIAGNOSIS — G8929 Other chronic pain: Secondary | ICD-10-CM

## 2023-09-12 NOTE — Therapy (Signed)
OUTPATIENT PHYSICAL THERAPY THORACOLUMBAR AND CERVICAL TREATMENT   Patient Name: Rebekah Hamilton MRN: [id number] DOB:[birth year], 78 y.o., female Today's Date: 09/12/2023  END OF SESSION:  PT End of Session - 09/12/23 1025     Visit Number 3    Number of Visits 24    Date for PT Re-Evaluation 11/28/23    Progress Note Due on Visit 10    PT Start Time 1016    PT Stop Time 1100    PT Time Calculation (min) 44 min    Activity Tolerance Patient tolerated treatment well    Behavior During Therapy WFL for tasks assessed/performed               Past Medical History:  Diagnosis Date   Allergy    Anemia    Anxiety    Depression    Hyperlipidemia    Hypertension    Lumbar stenosis    Migraines    Prediabetes    Past Surgical History:  Procedure Laterality Date   BREAST LUMPECTOMY Bilateral    CHOLECYSTECTOMY  1999   SPINE SURGERY     Patient Active Problem List   Diagnosis Date Noted   Cervical spondylosis 06/22/2023   Cerebral vascular disease 06/19/2023   Mild cognitive impairment 05/30/2023   Overweight with body mass index (BMI) of 28 to 28.9 in adult 12/05/2022   Postural dizziness with presyncope 12/05/2022   S/P lumbar fusion 11/12/2020   Chronic venous insufficiency 05/07/2019   History of 2019 novel coronavirus disease (COVID-19) 05/07/2019   Low back pain without sciatica 07/02/2018   OAB (overactive bladder) 04/02/2018   CKD (chronic kidney disease), symptom management only, stage 2 (mild) 09/24/2017   Glaucoma 06/02/2015   Vitamin D deficiency 10/21/2014   Hyperlipidemia, mixed    Essential hypertension    Abnormal glucose    Migraines    Allergy    Anxiety    Depression, major, recurrent (HCC)    Lumbar stenosis     PCP: [name], MD   REFERRING PROVIDER:   Levert Feinstein, MD    REFERRING DIAG:  G31.84 (ICD-10-CM) - Mild cognitive impairment  I67.9 (ICD-10-CM) - Cerebral vascular disease  M54.50 (ICD-10-CM) - Low back pain  without sciatica, unspecified back pain laterality, unspecified chronicity  M54.50,G89.29 (ICD-10-CM) - Chronic low back pain without sciatica, unspecified back pain laterality  M47.812 (ICD-10-CM) - Cervical spondylosis    Rationale for Evaluation and Treatment: Rehabilitation  THERAPY DIAG:  Other low back pain  Muscle weakness (generalized)  Cervicalgia  Difficulty in walking, not elsewhere classified  Chronic midline low back pain with left-sided sciatica  ONSET DATE: 02/03/23  SUBJECTIVE:  SUBJECTIVE STATEMENT:  Pt reports no changes since last visit. States neck, shoulder, and back are all sore.   PERTINENT HISTORY:  HLD, HTN, anxiety, chronic kidney disease, chronic back pain, lumbar fusion, mild cognitive impairment  PAIN:  Are you having pain? Yes: NPRS scale: 5-6 Pain location: L ARM R LE Pain description: ache Aggravating factors: unsure Relieving factors: unsure  PRECAUTIONS: None  RED FLAGS: None   WEIGHT BEARING RESTRICTIONS: No  FALLS:  Has patient fallen in last 6 months? No  LIVING ENVIRONMENT: Lives with: lives alone Lives in: House/apartment Stairs: Yes: Internal: 4 steps; on right going up Has following equipment at home: Single point cane, Walker - 2 wheeled, and shower chair  OCCUPATION: retired   PLOF: Independent  PATIENT GOALS: Improve pain in her leg and arm   NEXT MD VISIT: Ask next visit   OBJECTIVE:  Note: Objective measures were completed at Evaluation unless otherwise noted.  DIAGNOSTIC FINDINGS:  N/a  PATIENT SURVEYS:  Modified Oswestry 12/50  FOTO 53  COGNITION: Overall cognitive status:  Mild cognitive impairment to listed in chart, patient has difficulty following along with questions and answering some questions specifically       SENSATION: WFL  MUSCLE LENGTH: Hamstrings: Right 45 degrees deg; Left 35 degrees deg Thomas test: Right WNL deg; Left WNL deg  POSTURE: rounded shoulders, forward head, and decreased lumbar lordosis  PALPATION: Not assessed   LUMBAR ROM:   AROM eval  Flexion Hands to knees  Extension min  Right lateral flexion To knee lateral  Left lateral flexion To knee lateral  Right rotation   Left rotation    (Blank rows = not tested)    LOWER EXTREMITY MMT:    MMT Right eval Left eval  Hip flexion 4 4  Hip extension    Hip abduction 4 4  Hip adduction 4 4  Hip internal rotation    Hip external rotation    Knee flexion 4 4  Knee extension 4 4  Ankle dorsiflexion 4 4  Ankle plantarflexion    Ankle inversion    Ankle eversion     (Blank rows = not tested)  LUMBAR SPECIAL TESTS:  Straight leg raise test: Positive, FABER test: Negative, and Double leg lowering test negative   FUNCTIONAL TESTS:  5 times sit to stand: 15.53 sec    TREATMENT DATE: 09/12/23   Manual: cervical region Bilateral rotation stretch 3 x 45 sec each direction Bilateral side bending stretch 2 x 45 sec each direction Suboccipital release:  2 x 45 sec , relieves pain STM along cervical paraspinals and UT region - each side  Review of previous HEP Lower trunk rotation 10 x  Chin tucks supine x 10 x 5 sec hold  Seated scapular retraction 2 x 10  Seated shoulder rolls 2 x 10   Education in Transverse abdominals  (using video, Internet) -TrA engagement in supine/hooklye with 5 sec x 10 reps  -TrA engagement in supine/hookleye with alt LE lift x 10 reps Education in safe log roll technique- PT demo and patient able to return demonstration.    PATIENT EDUCATION:  Education details: POC Person educated: Patient Education method: Explanation Education comprehension: verbalized understanding   HOME EXERCISE PROGRAM: Access Code: [email or web address] URL: [email or web address] Date:  09/12/2023 Prepared by: Maureen Ralphs  Exercises - Hooklying Transversus Abdominis Palpation  - 1 x daily - 3 sets - 10 reps - 5 hold - Seated Passive Cervical Retraction  - 1 x daily - 3  sets - 10 reps    Access Code: [email or web address] URL: [email or web address] Date: 09/07/2023 Prepared by: Thresa Ross  Exercises - Supine Lower Trunk Rotation  - 1 x daily - 7 x weekly - 1 sets - 10 reps - 5 sec hold - Supine Chin Tuck  - 2 x daily - 7 x weekly - 2 sets - 10 reps - 5 sec hold - Seated Scapular Retraction  - 2 x daily - 7 x weekly - 2 sets - 10 reps - Seated Shoulder Rolls  - 1 x daily - 7 x weekly - 2 sets - 10 reps   ASSESSMENT:  CLINICAL IMPRESSION: Patient continues to respond well with manual techniques for cervical mobility/pain. She was educated in how her core strengthening can assist with Low back pain. She was responsive to all education and able to perform well with new exercises and good understanding of previous HEP.  Patient will benefit from skilled physical therapy to address her low back and lower extremity radiculopathy as well as her cervical and left upper extremity pain and discomfort.  OBJECTIVE IMPAIRMENTS: decreased activity tolerance, decreased strength, hypomobility, impaired perceived functional ability, impaired UE functional use, and pain.   ACTIVITY LIMITATIONS: carrying, lifting, squatting, and locomotion level  PARTICIPATION LIMITATIONS: driving, shopping, and community activity  PERSONAL FACTORS: Age and 3+ comorbidities: HLD, HTN, anxiety, depression, chronic issues with low back pain  are also affecting patient's functional outcome.   REHAB POTENTIAL: Fair unsure of radicular causes of pain, especially at different spinal levels  CLINICAL DECISION MAKING: Evolving/moderate complexity  EVALUATION COMPLEXITY: Moderate   GOALS: Goals reviewed with patient? Yes  SHORT TERM GOALS: Target date: 09/26/2023    Patient will be  independent in home exercise program to improve strength/mobility for better functional independence with ADLs. Baseline: No HEP currently  Goal status: INITIAL  LONG TERM GOALS: Target date: 11/28/2023  1.  Patient (> 44 years old) will complete five times sit to stand test in < 12 seconds indicating an increased LE strength and power. Baseline: 15.5 sec with UE suppor ton 2 reps  Goal status: INITIAL  2.  Patient will increase FOTO score to equal to or greater than  57   to demonstrate statistically significant improvement in mobility and quality of life.  Baseline: 53 Goal status: INITIAL   3.  Patient will increase MODI questionnaire by 6 points or greater to indicate improved subjective level of physical function as a result of her back and leg pain  Baseline: 12 Goal status: INITIAL  4.  Patient will improve lower extremity strength in all major planes to 4+ out of 5 or greater particularly in her hip and knee musculature in order to indicate improvement in her lower extremity strength and stability Baseline: See eval Goal status: INITIAL       PLAN:  PT FREQUENCY: 1-2x/week  PT DURATION: 12 weeks  PLANNED INTERVENTIONS: 97110-Therapeutic exercises, 97530- Therapeutic activity, 97112- Neuromuscular re-education, 97535- Self Care, 29528- Manual therapy, Balance training, Dry Needling, and Moist heat.  PLAN FOR NEXT SESSION: cervical and lumbar radiculopathy treatment, L UE scapular and impingement involvement. Continue next visit with core strengthening.    Lenda Kelp, PT 09/12/2023, 1:16 PM

## 2023-09-14 ENCOUNTER — Ambulatory Visit: Payer: HMO

## 2023-09-14 DIAGNOSIS — G8929 Other chronic pain: Secondary | ICD-10-CM

## 2023-09-14 DIAGNOSIS — M5459 Other low back pain: Secondary | ICD-10-CM | POA: Diagnosis not present

## 2023-09-14 DIAGNOSIS — M6281 Muscle weakness (generalized): Secondary | ICD-10-CM

## 2023-09-14 DIAGNOSIS — M542 Cervicalgia: Secondary | ICD-10-CM

## 2023-09-14 DIAGNOSIS — M25551 Pain in right hip: Secondary | ICD-10-CM

## 2023-09-14 DIAGNOSIS — R262 Difficulty in walking, not elsewhere classified: Secondary | ICD-10-CM

## 2023-09-14 DIAGNOSIS — R2681 Unsteadiness on feet: Secondary | ICD-10-CM

## 2023-09-14 NOTE — Therapy (Signed)
OUTPATIENT PHYSICAL THERAPY THORACOLUMBAR AND CERVICAL TREATMENT   Patient Name: Rebekah Hamilton MRN: [id number] DOB:[birth year], 78 y.o., female Today's Date: 09/14/2023  END OF SESSION:  PT End of Session - 09/14/23 1140     Visit Number 4    Number of Visits 24    Date for PT Re-Evaluation 11/28/23    Progress Note Due on Visit 10    PT Start Time 1140    PT Stop Time 1228    PT Time Calculation (min) 48 min    Activity Tolerance Patient tolerated treatment well    Behavior During Therapy WFL for tasks assessed/performed               Past Medical History:  Diagnosis Date   Allergy    Anemia    Anxiety    Depression    Hyperlipidemia    Hypertension    Lumbar stenosis    Migraines    Prediabetes    Past Surgical History:  Procedure Laterality Date   BREAST LUMPECTOMY Bilateral    CHOLECYSTECTOMY  1999   SPINE SURGERY     Patient Active Problem List   Diagnosis Date Noted   Cervical spondylosis 06/22/2023   Cerebral vascular disease 06/19/2023   Mild cognitive impairment 05/30/2023   Overweight with body mass index (BMI) of 28 to 28.9 in adult 12/05/2022   Postural dizziness with presyncope 12/05/2022   S/P lumbar fusion 11/12/2020   Chronic venous insufficiency 05/07/2019   History of 2019 novel coronavirus disease (COVID-19) 05/07/2019   Low back pain without sciatica 07/02/2018   OAB (overactive bladder) 04/02/2018   CKD (chronic kidney disease), symptom management only, stage 2 (mild) 09/24/2017   Glaucoma 06/02/2015   Vitamin D deficiency 10/21/2014   Hyperlipidemia, mixed    Essential hypertension    Abnormal glucose    Migraines    Allergy    Anxiety    Depression, major, recurrent (HCC)    Lumbar stenosis     PCP: [name], MD   REFERRING PROVIDER:   Levert Feinstein, MD    REFERRING DIAG:  G31.84 (ICD-10-CM) - Mild cognitive impairment  I67.9 (ICD-10-CM) - Cerebral vascular disease  M54.50 (ICD-10-CM) - Low back pain  without sciatica, unspecified back pain laterality, unspecified chronicity  M54.50,G89.29 (ICD-10-CM) - Chronic low back pain without sciatica, unspecified back pain laterality  M47.812 (ICD-10-CM) - Cervical spondylosis    Rationale for Evaluation and Treatment: Rehabilitation  THERAPY DIAG:  Other low back pain  Muscle weakness (generalized)  Cervicalgia  Difficulty in walking, not elsewhere classified  Chronic midline low back pain with left-sided sciatica  Unsteadiness on feet  Pain in right hip  ONSET DATE: 02/03/23  SUBJECTIVE:  SUBJECTIVE STATEMENT:  Patient reports trying to stay active and keep moving without making my pain worse   PERTINENT HISTORY:  HLD, HTN, anxiety, chronic kidney disease, chronic back pain, lumbar fusion, mild cognitive impairment  PAIN:  Are you having pain? Yes: NPRS scale: 5-6 Pain location: L ARM R LE Pain description: ache Aggravating factors: unsure Relieving factors: unsure  PRECAUTIONS: None  RED FLAGS: None   WEIGHT BEARING RESTRICTIONS: No  FALLS:  Has patient fallen in last 6 months? No  LIVING ENVIRONMENT: Lives with: lives alone Lives in: House/apartment Stairs: Yes: Internal: 4 steps; on right going up Has following equipment at home: Single point cane, Walker - 2 wheeled, and shower chair  OCCUPATION: retired   PLOF: Independent  PATIENT GOALS: Improve pain in her leg and arm   NEXT MD VISIT: Ask next visit   OBJECTIVE:  Note: Objective measures were completed at Evaluation unless otherwise noted.  DIAGNOSTIC FINDINGS:  N/a  PATIENT SURVEYS:  Modified Oswestry 12/50  FOTO 53  COGNITION: Overall cognitive status:  Mild cognitive impairment to listed in chart, patient has difficulty following along with questions and  answering some questions specifically      SENSATION: WFL  MUSCLE LENGTH: Hamstrings: Right 45 degrees deg; Left 35 degrees deg Thomas test: Right WNL deg; Left WNL deg  POSTURE: rounded shoulders, forward head, and decreased lumbar lordosis  PALPATION: Not assessed   LUMBAR ROM:   AROM eval  Flexion Hands to knees  Extension min  Right lateral flexion To knee lateral  Left lateral flexion To knee lateral  Right rotation   Left rotation    (Blank rows = not tested)    LOWER EXTREMITY MMT:    MMT Right eval Left eval  Hip flexion 4 4  Hip extension    Hip abduction 4 4  Hip adduction 4 4  Hip internal rotation    Hip external rotation    Knee flexion 4 4  Knee extension 4 4  Ankle dorsiflexion 4 4  Ankle plantarflexion    Ankle inversion    Ankle eversion     (Blank rows = not tested)  LUMBAR SPECIAL TESTS:  Straight leg raise test: Positive, FABER test: Negative, and Double leg lowering test negative   FUNCTIONAL TESTS:  5 times sit to stand: 15.53 sec    TREATMENT DATE: 09/14/23   Manual: cervical region Bilateral rotation stretch 3 x 45 sec each direction Bilateral side bending stretch 2 x 45 sec each direction Passive Levator stretch on left - hold 30 sec x 3 Suboccipital release:  2 x 45 sec , relieves pain STM along cervical paraspinals and Left side levatorUT region - each side Gentle PA mobs with C4-7 region x 10 bouts each level  Postural strengthening  Chin tucks supine x 10 x 5 sec hold  Seated scapular retraction 2 x 10 with RTB (VC and visual cues for technique)  Seated horizontal Shoulder ABD with RTB Seated posterior shoulder rolls 2 x 10   Progression in Transverse abdominals  -TrA engagement in seated position with LAQ x 15 reps each LE.    PATIENT EDUCATION:  Education details: POC Person educated: Patient Education method: Explanation Education comprehension: verbalized understanding   HOME EXERCISE PROGRAM: Access  Code: [email or web address] URL: [email or web address] Date: 09/14/2023 Prepared by: Maureen Ralphs  Exercises - Seated Hamstring Stretch  - 1 x daily - 3 sets - 30-45 sec hold - Seated Shoulder Row with Anchored Resistance  - 3 x  weekly - 3 sets - 10 reps - Seated Shoulder Horizontal Abduction with Resistance  - 1 x daily - 3 x weekly - 3 sets - 10 reps      Access Code: [email or web address] URL: [email or web address] Date: 09/12/2023 Prepared by: Maureen Ralphs  Exercises - Hooklying Transversus Abdominis Palpation  - 1 x daily - 3 sets - 10 reps - 5 hold - Seated Passive Cervical Retraction  - 1 x daily - 3 sets - 10 reps    Access Code: [email or web address] URL: [email or web address] Date: 09/07/2023 Prepared by: Thresa Ross  Exercises - Supine Lower Trunk Rotation  - 1 x daily - 7 x weekly - 1 sets - 10 reps - 5 sec hold - Supine Chin Tuck  - 2 x daily - 7 x weekly - 2 sets - 10 reps - 5 sec hold - Seated Scapular Retraction  - 2 x daily - 7 x weekly - 2 sets - 10 reps - Seated Shoulder Rolls  - 1 x daily - 7 x weekly - 2 sets - 10 reps   ASSESSMENT:  CLINICAL IMPRESSION: Patient presents with good motivation and responded well to postural strengthening and manual therapy to cervical region. She presented with some initial tightness and pain in left upper trap- much more relaxed after manual therapy. Treatment then focused on postural strengthening and patient able to follow all VC/Tactile cues and properly perform all activities today. Provided another HEP updated handout today and patient verbalized understanding.  Patient will benefit from skilled physical therapy to address her low back and lower extremity radiculopathy as well as her cervical and left upper extremity pain and discomfort.  OBJECTIVE IMPAIRMENTS: decreased activity tolerance, decreased strength, hypomobility, impaired perceived functional ability, impaired UE functional use, and  pain.   ACTIVITY LIMITATIONS: carrying, lifting, squatting, and locomotion level  PARTICIPATION LIMITATIONS: driving, shopping, and community activity  PERSONAL FACTORS: Age and 3+ comorbidities: HLD, HTN, anxiety, depression, chronic issues with low back pain  are also affecting patient's functional outcome.   REHAB POTENTIAL: Fair unsure of radicular causes of pain, especially at different spinal levels  CLINICAL DECISION MAKING: Evolving/moderate complexity  EVALUATION COMPLEXITY: Moderate   GOALS: Goals reviewed with patient? Yes  SHORT TERM GOALS: Target date: 09/26/2023    Patient will be independent in home exercise program to improve strength/mobility for better functional independence with ADLs. Baseline: No HEP currently  Goal status: INITIAL  LONG TERM GOALS: Target date: 11/28/2023  1.  Patient (> 48 years old) will complete five times sit to stand test in < 12 seconds indicating an increased LE strength and power. Baseline: 15.5 sec with UE suppor ton 2 reps  Goal status: INITIAL  2.  Patient will increase FOTO score to equal to or greater than  57   to demonstrate statistically significant improvement in mobility and quality of life.  Baseline: 53 Goal status: INITIAL   3.  Patient will increase MODI questionnaire by 6 points or greater to indicate improved subjective level of physical function as a result of her back and leg pain  Baseline: 12 Goal status: INITIAL  4.  Patient will improve lower extremity strength in all major planes to 4+ out of 5 or greater particularly in her hip and knee musculature in order to indicate improvement in her lower extremity strength and stability Baseline: See eval Goal status: INITIAL       PLAN:  PT FREQUENCY: 1-2x/week  PT DURATION: 12 weeks  PLANNED INTERVENTIONS: 97110-Therapeutic exercises, 97530-  Therapeutic activity, [id number]- Neuromuscular re-education, 97535- Self Care, [id number]- Manual therapy, Balance training,  Dry Needling, and Moist heat.  PLAN FOR NEXT SESSION: cervical and lumbar radiculopathy treatment, L UE scapular and impingement involvement. Continue next visit with core strengthening.    Lenda Kelp, PT 09/14/2023, 1:35 PM

## 2023-09-19 ENCOUNTER — Ambulatory Visit: Payer: PPO | Attending: Neurology

## 2023-09-19 DIAGNOSIS — M5459 Other low back pain: Secondary | ICD-10-CM | POA: Insufficient documentation

## 2023-09-19 DIAGNOSIS — M542 Cervicalgia: Secondary | ICD-10-CM | POA: Diagnosis not present

## 2023-09-19 DIAGNOSIS — M25551 Pain in right hip: Secondary | ICD-10-CM | POA: Diagnosis not present

## 2023-09-19 DIAGNOSIS — M5442 Lumbago with sciatica, left side: Secondary | ICD-10-CM | POA: Insufficient documentation

## 2023-09-19 DIAGNOSIS — R2681 Unsteadiness on feet: Secondary | ICD-10-CM | POA: Diagnosis not present

## 2023-09-19 DIAGNOSIS — M6281 Muscle weakness (generalized): Secondary | ICD-10-CM | POA: Insufficient documentation

## 2023-09-19 DIAGNOSIS — G8929 Other chronic pain: Secondary | ICD-10-CM | POA: Diagnosis not present

## 2023-09-19 DIAGNOSIS — R262 Difficulty in walking, not elsewhere classified: Secondary | ICD-10-CM | POA: Insufficient documentation

## 2023-09-19 NOTE — Therapy (Signed)
 OUTPATIENT PHYSICAL THERAPY THORACOLUMBAR AND CERVICAL TREATMENT   Patient Name: Rebekah Hamilton MRN: [id number] DOB:1945/11/05, 78 y.o., female Today's Date: 09/19/2023  END OF SESSION:  PT End of Session - 09/19/23 0843     Visit Number 5    Number of Visits 24    Date for PT Re-Evaluation 11/28/23    Progress Note Due on Visit 10    PT Start Time 0845    PT Stop Time 0929    PT Time Calculation (min) 44 min    Activity Tolerance Patient tolerated treatment well    Behavior During Therapy [facility] for tasks assessed/performed                Past Medical History:  Diagnosis Date   Allergy    Anemia    Anxiety    Depression    Hyperlipidemia    Hypertension    Lumbar stenosis    Migraines    Prediabetes    Past Surgical History:  Procedure Laterality Date   BREAST LUMPECTOMY Bilateral    CHOLECYSTECTOMY  1999   SPINE SURGERY     Patient Active Problem List   Diagnosis Date Noted   Cervical spondylosis 06/22/2023   Cerebral vascular disease 06/19/2023   Mild cognitive impairment 05/30/2023   Overweight with body mass index (BMI) of 28 to 28.9 in adult 12/05/2022   Postural dizziness with presyncope 12/05/2022   S/P lumbar fusion 11/12/2020   Chronic venous insufficiency 05/07/2019   History of 2019 novel coronavirus disease (COVID-19) 05/07/2019   Low back pain without sciatica 07/02/2018   OAB (overactive bladder) 04/02/2018   CKD (chronic kidney disease), symptom management only, stage 2 (mild) 09/24/2017   Glaucoma 06/02/2015   Vitamin D  deficiency 10/21/2014   Hyperlipidemia, mixed    Essential hypertension    Abnormal glucose    Migraines    Allergy    Anxiety    Depression, major, recurrent (HCC)    Lumbar stenosis     PCP: Tonita Fallow, MD   REFERRING PROVIDER:   Onita Duos, MD    REFERRING DIAG:  G31.84 (ICD-10-CM) - Mild cognitive impairment  I67.9 (ICD-10-CM) - Cerebral vascular disease  M54.50 (ICD-10-CM) - Low back pain  without sciatica, unspecified back pain laterality, unspecified chronicity  M54.50,G89.29 (ICD-10-CM) - Chronic low back pain without sciatica, unspecified back pain laterality  M47.812 (ICD-10-CM) - Cervical spondylosis    Rationale for Evaluation and Treatment: Rehabilitation  THERAPY DIAG:  Other low back pain  Muscle weakness (generalized)  Cervicalgia  Difficulty in walking, not elsewhere classified  ONSET DATE: 02/03/23  SUBJECTIVE:  SUBJECTIVE STATEMENT:  Patient went to the beach over the weekend, reports having some back pain from walking in the sand.   PERTINENT HISTORY:  HLD, HTN, anxiety, chronic kidney disease, chronic back pain, lumbar fusion, mild cognitive impairment  PAIN:  Are you having pain? Yes: NPRS scale: 5-6 Pain location: L ARM R LE Pain description: ache Aggravating factors: unsure Relieving factors: unsure  PRECAUTIONS: None  RED FLAGS: None   WEIGHT BEARING RESTRICTIONS: No  FALLS:  Has patient fallen in last 6 months? No  LIVING ENVIRONMENT: Lives with: lives alone Lives in: House/apartment Stairs: Yes: Internal: 4 steps; on right going up Has following equipment at home: Single point cane, Walker - 2 wheeled, and shower chair  OCCUPATION: retired   PLOF: Independent  PATIENT GOALS: Improve pain in her leg and arm   NEXT MD VISIT: Ask next visit   OBJECTIVE:  Note: Objective measures were completed at Evaluation unless otherwise noted.  DIAGNOSTIC FINDINGS:  N/a  PATIENT SURVEYS:  Modified Oswestry 12/50  FOTO 53  COGNITION: Overall cognitive status:  Mild cognitive impairment to listed in chart, patient has difficulty following along with questions and answering some questions specifically      SENSATION: WFL  MUSCLE  LENGTH: Hamstrings: Right 45 degrees deg; Left 35 degrees deg Thomas test: Right WNL deg; Left WNL deg  POSTURE: rounded shoulders, forward head, and decreased lumbar lordosis  PALPATION: Not assessed   LUMBAR ROM:   AROM eval  Flexion Hands to knees  Extension min  Right lateral flexion To knee lateral  Left lateral flexion To knee lateral  Right rotation   Left rotation    (Blank rows = not tested)    LOWER EXTREMITY MMT:    MMT Right eval Left eval  Hip flexion 4 4  Hip extension    Hip abduction 4 4  Hip adduction 4 4  Hip internal rotation    Hip external rotation    Knee flexion 4 4  Knee extension 4 4  Ankle dorsiflexion 4 4  Ankle plantarflexion    Ankle inversion    Ankle eversion     (Blank rows = not tested)  LUMBAR SPECIAL TESTS:  Straight leg raise test: Positive, FABER test: Negative, and Double leg lowering test negative   FUNCTIONAL TESTS:  5 times sit to stand: 15.53 sec    TREATMENT DATE: 09/19/23  Manual; Bilateral rotation stretch 3 x 45 sec each direction Bilateral side bending stretch 2 x 45 sec each direction Passive Levator stretch on left - hold 30 sec x 3 Suboccipital release:  2 x 45 sec , relieves pain Grade II cervical mobilizations  therEx: Single knee to chest hold 60 seconds each LE Hamstring stretch 60 seconds each LE Nerve glide 60 seconds each LE Figure four 60 seconds each LE RTB overhead Y 15x  Chin tucks supine x 10 x 5 sec hold   TherAct Posterior pelvic tilt with focus on breathing 15x TrA activation with swiss ball 12x 3 second holds TrA activation with swiss ball with dead bug positioning 10x each side  Green swiss ball hamstring curl with cue for core activation  Adduction hooklying with TrA activation 12x 3 second holds  Robber stretch 2 x 30 seconds   PATIENT EDUCATION:  Education details: POC Person educated: Patient Education method: Explanation Education comprehension: verbalized  understanding   HOME EXERCISE PROGRAM: Access Code: [email or web address] URL: [email or web address] Date: 09/14/2023 Prepared by: Reyes London  Exercises - Seated Hamstring Stretch  -  1 x daily - 3 sets - 30-45 sec hold - Seated Shoulder Row with Anchored Resistance  - 3 x weekly - 3 sets - 10 reps - Seated Shoulder Horizontal Abduction with Resistance  - 1 x daily - 3 x weekly - 3 sets - 10 reps      Access Code: [email or web address] URL: [email or web address] Date: 09/12/2023 Prepared by: Reyes London  Exercises - Hooklying Transversus Abdominis Palpation  - 1 x daily - 3 sets - 10 reps - 5 hold - Seated Passive Cervical Retraction  - 1 x daily - 3 sets - 10 reps    Access Code: [email or web address] URL: [email or web address] Date: 09/07/2023 Prepared by: Lonni Gainer  Exercises - Supine Lower Trunk Rotation  - 1 x daily - 7 x weekly - 1 sets - 10 reps - 5 sec hold - Supine Chin Tuck  - 2 x daily - 7 x weekly - 2 sets - 10 reps - 5 sec hold - Seated Scapular Retraction  - 2 x daily - 7 x weekly - 2 sets - 10 reps - Seated Shoulder Rolls  - 1 x daily - 7 x weekly - 2 sets - 10 reps   ASSESSMENT:  CLINICAL IMPRESSION:  Patient is very pleasant and motivated. She requires multimodal cueing for task performance. Introduction to lower back interventions tolerated well.  Patient will benefit from skilled physical therapy to address her low back and lower extremity radiculopathy as well as her cervical and left upper extremity pain and discomfort.  OBJECTIVE IMPAIRMENTS: decreased activity tolerance, decreased strength, hypomobility, impaired perceived functional ability, impaired UE functional use, and pain.   ACTIVITY LIMITATIONS: carrying, lifting, squatting, and locomotion level  PARTICIPATION LIMITATIONS: driving, shopping, and community activity  PERSONAL FACTORS: Age and 3+ comorbidities: HLD, HTN, anxiety, depression, chronic issues with  low back pain  are also affecting patient's functional outcome.   REHAB POTENTIAL: Fair unsure of radicular causes of pain, especially at different spinal levels  CLINICAL DECISION MAKING: Evolving/moderate complexity  EVALUATION COMPLEXITY: Moderate   GOALS: Goals reviewed with patient? Yes  SHORT TERM GOALS: Target date: 09/26/2023    Patient will be independent in home exercise program to improve strength/mobility for better functional independence with ADLs. Baseline: No HEP currently  Goal status: INITIAL  LONG TERM GOALS: Target date: 11/28/2023  1.  Patient (> 32 years old) will complete five times sit to stand test in < 12 seconds indicating an increased LE strength and power. Baseline: 15.5 sec with UE suppor ton 2 reps  Goal status: INITIAL  2.  Patient will increase FOTO score to equal to or greater than  57   to demonstrate statistically significant improvement in mobility and quality of life.  Baseline: 53 Goal status: INITIAL   3.  Patient will increase MODI questionnaire by 6 points or greater to indicate improved subjective level of physical function as a result of her back and leg pain  Baseline: 12 Goal status: INITIAL  4.  Patient will improve lower extremity strength in all major planes to 4+ out of 5 or greater particularly in her hip and knee musculature in order to indicate improvement in her lower extremity strength and stability Baseline: See eval Goal status: INITIAL       PLAN:  PT FREQUENCY: 1-2x/week  PT DURATION: 12 weeks  PLANNED INTERVENTIONS: 97110-Therapeutic exercises, 97530- Therapeutic activity, 97112- Neuromuscular re-education, 97535- Self Care, [id number]- Manual therapy, Balance training, Dry Needling, and Moist heat.  PLAN FOR NEXT SESSION:  cervical and lumbar radiculopathy treatment, L UE scapular and impingement involvement. Continue next visit with core strengthening.    Owen Pratte, PT 09/19/2023, 9:32 AM

## 2023-09-20 NOTE — Therapy (Signed)
 OUTPATIENT PHYSICAL THERAPY THORACOLUMBAR AND CERVICAL TREATMENT   Patient Name: Rebekah Hamilton MRN: [id number] DOB:05-May-1946, 78 y.o., female Today's Date: 09/21/2023  END OF SESSION:  PT End of Session - 09/21/23 1020     Visit Number 6    Number of Visits 24    Date for PT Re-Evaluation 11/28/23    Progress Note Due on Visit 10    PT Start Time 1018    PT Stop Time 1056    PT Time Calculation (min) 38 min    Activity Tolerance Patient tolerated treatment well    Behavior During Therapy WFL for tasks assessed/performed                 Past Medical History:  Diagnosis Date   Allergy    Anemia    Anxiety    Depression    Hyperlipidemia    Hypertension    Lumbar stenosis    Migraines    Prediabetes    Past Surgical History:  Procedure Laterality Date   BREAST LUMPECTOMY Bilateral    CHOLECYSTECTOMY  1999   SPINE SURGERY     Patient Active Problem List   Diagnosis Date Noted   Cervical spondylosis 06/22/2023   Cerebral vascular disease 06/19/2023   Mild cognitive impairment 05/30/2023   Overweight with body mass index (BMI) of 28 to 28.9 in adult 12/05/2022   Postural dizziness with presyncope 12/05/2022   S/P lumbar fusion 11/12/2020   Chronic venous insufficiency 05/07/2019   History of 2019 novel coronavirus disease (COVID-19) 05/07/2019   Low back pain without sciatica 07/02/2018   OAB (overactive bladder) 04/02/2018   CKD (chronic kidney disease), symptom management only, stage 2 (mild) 09/24/2017   Glaucoma 06/02/2015   Vitamin D  deficiency 10/21/2014   Hyperlipidemia, mixed    Essential hypertension    Abnormal glucose    Migraines    Allergy    Anxiety    Depression, major, recurrent (HCC)    Lumbar stenosis     PCP: Tonita Fallow, MD   REFERRING PROVIDER:   Onita Duos, MD    REFERRING DIAG:  G31.84 (ICD-10-CM) - Mild cognitive impairment  I67.9 (ICD-10-CM) - Cerebral vascular disease  M54.50 (ICD-10-CM) - Low back  pain without sciatica, unspecified back pain laterality, unspecified chronicity  M54.50,G89.29 (ICD-10-CM) - Chronic low back pain without sciatica, unspecified back pain laterality  M47.812 (ICD-10-CM) - Cervical spondylosis    Rationale for Evaluation and Treatment: Rehabilitation  THERAPY DIAG:  No diagnosis found.  ONSET DATE: 02/03/23  SUBJECTIVE:  SUBJECTIVE STATEMENT:  Pt reports doing well today. Pt denies any recent falls/stumbles since prior session. Pt denies any updates to medications or medical appointment since prior session. Pt reports fair compliance with HEP.    PERTINENT HISTORY:  HLD, HTN, anxiety, chronic kidney disease, chronic back pain, lumbar fusion, mild cognitive impairment  PAIN:  Are you having pain? Yes: NPRS scale: 5-6 Pain location: L ARM R LE Pain description: ache Aggravating factors: unsure Relieving factors: unsure  PRECAUTIONS: None  RED FLAGS: None   WEIGHT BEARING RESTRICTIONS: No  FALLS:  Has patient fallen in last 6 months? No  LIVING ENVIRONMENT: Lives with: lives alone Lives in: House/apartment Stairs: Yes: Internal: 4 steps; on right going up Has following equipment at home: Single point cane, Walker - 2 wheeled, and shower chair  OCCUPATION: retired   PLOF: Independent  PATIENT GOALS: Improve pain in her leg and arm   NEXT MD VISIT: Ask next visit   OBJECTIVE:  Note: Objective measures were completed at Evaluation unless otherwise noted.  DIAGNOSTIC FINDINGS:  N/a  PATIENT SURVEYS:  Modified Oswestry 12/50  FOTO 53  COGNITION: Overall cognitive status:  Mild cognitive impairment to listed in chart, patient has difficulty following along with questions and answering some questions specifically      SENSATION: WFL  MUSCLE  LENGTH: Hamstrings: Right 45 degrees deg; Left 35 degrees deg Thomas test: Right WNL deg; Left WNL deg  POSTURE: rounded shoulders, forward head, and decreased lumbar lordosis  PALPATION: Not assessed   LUMBAR ROM:   AROM eval  Flexion Hands to knees  Extension min  Right lateral flexion To knee lateral  Left lateral flexion To knee lateral  Right rotation   Left rotation    (Blank rows = not tested)    LOWER EXTREMITY MMT:    MMT Right eval Left eval  Hip flexion 4 4  Hip extension    Hip abduction 4 4  Hip adduction 4 4  Hip internal rotation    Hip external rotation    Knee flexion 4 4  Knee extension 4 4  Ankle dorsiflexion 4 4  Ankle plantarflexion    Ankle inversion    Ankle eversion     (Blank rows = not tested)  LUMBAR SPECIAL TESTS:  Straight leg raise test: Positive, FABER test: Negative, and Double leg lowering test negative   FUNCTIONAL TESTS:  5 times sit to stand: 15.53 sec    TREATMENT DATE: 09/21/23  Manual; Bilateral rotation stretch 2 x 45 sec each direction Bilateral side bending cervical stretch 2 x 45 sec each direction Suboccipital release:  2 x 45 sec , relieves pain   therEx: Nustep B UE and LE reciprocal movement: x 6 min  Single knee to chest hold 45 seconds each LE Figure four 60 seconds each LE Chin tucks supine x 10 x 5 sec hold   TherAct Posterior pelvic tilt x 10 x 5 sec hold TrA activation with march x 10 ea LE  TrA activation with swiss ball with dead bug positioning with 10 arm raises only ea side  Green swiss ball hamstring curl with cue for core activation  Adduction hooklying with TrA activation 10 x 3 second holds   Extensive education regarding importance of core and postural muscle activation and strength for daily activities as well as education on sarcopenia and ways to combat age related muscle loss  PATIENT EDUCATION:  Education details: POC Person educated: Patient Education method:  Explanation Education comprehension:  verbalized understanding   HOME EXERCISE PROGRAM: Access Code: [email or web address] URL: [email or web address] Date: 09/21/2023 Prepared by: Lonni Gainer  Exercises - Seated Hamstring Stretch  - 1 x daily - 3 sets - 30-45 sec hold - Seated Shoulder Row with Anchored Resistance  - 3 x weekly - 3 sets - 10 reps - Seated Shoulder Horizontal Abduction with Resistance  - 1 x daily - 3 x weekly - 3 sets - 10 reps - Supine Chin Tuck  - 1 x daily - 7 x weekly - 2 sets - 10 reps - 5 sec hold - Supine Posterior Pelvic Tilt  - 1 x daily - 7 x weekly - 2 sets - 10 reps - 5 sec hold   ASSESSMENT:  CLINICAL IMPRESSION:  Patient is very pleasant and motivated. She requires multimodal cueing for task performance. Updated HEP to provide with single handout and progressed core activation to add to HEP. Extensive education provided throughout session regarding importance of core and postural strength, particularly with aging. Patient will benefit from skilled physical therapy to address her low back and lower extremity radiculopathy as well as her cervical and left upper extremity pain and discomfort.  OBJECTIVE IMPAIRMENTS: decreased activity tolerance, decreased strength, hypomobility, impaired perceived functional ability, impaired UE functional use, and pain.   ACTIVITY LIMITATIONS: carrying, lifting, squatting, and locomotion level  PARTICIPATION LIMITATIONS: driving, shopping, and community activity  PERSONAL FACTORS: Age and 3+ comorbidities: HLD, HTN, anxiety, depression, chronic issues with low back pain  are also affecting patient's functional outcome.   REHAB POTENTIAL: Fair unsure of radicular causes of pain, especially at different spinal levels  CLINICAL DECISION MAKING: Evolving/moderate complexity  EVALUATION COMPLEXITY: Moderate   GOALS: Goals reviewed with patient? Yes  SHORT TERM GOALS: Target date: 09/26/2023    Patient will be  independent in home exercise program to improve strength/mobility for better functional independence with ADLs. Baseline: No HEP currently  Goal status: INITIAL  LONG TERM GOALS: Target date: 11/28/2023  1.  Patient (> 86 years old) will complete five times sit to stand test in < 12 seconds indicating an increased LE strength and power. Baseline: 15.5 sec with UE suppor ton 2 reps  Goal status: INITIAL  2.  Patient will increase FOTO score to equal to or greater than  57   to demonstrate statistically significant improvement in mobility and quality of life.  Baseline: 53 Goal status: INITIAL   3.  Patient will increase MODI questionnaire by 6 points or greater to indicate improved subjective level of physical function as a result of her back and leg pain  Baseline: 12 Goal status: INITIAL  4.  Patient will improve lower extremity strength in all major planes to 4+ out of 5 or greater particularly in her hip and knee musculature in order to indicate improvement in her lower extremity strength and stability Baseline: See eval Goal status: INITIAL       PLAN:  PT FREQUENCY: 1-2x/week  PT DURATION: 12 weeks  PLANNED INTERVENTIONS: 97110-Therapeutic exercises, 97530- Therapeutic activity, 97112- Neuromuscular re-education, 97535- Self Care, [id number]- Manual therapy, Balance training, Dry Needling, and Moist heat.  PLAN FOR NEXT SESSION: cervical and lumbar radiculopathy treatment, L UE scapular and impingement involvement. Continue next visit with core strengthening.    Lonni KATHEE Gainer, PT 09/21/2023, 10:21 AM

## 2023-09-21 ENCOUNTER — Ambulatory Visit: Payer: PPO | Admitting: Physical Therapy

## 2023-09-21 DIAGNOSIS — R262 Difficulty in walking, not elsewhere classified: Secondary | ICD-10-CM

## 2023-09-21 DIAGNOSIS — M542 Cervicalgia: Secondary | ICD-10-CM

## 2023-09-21 DIAGNOSIS — M6281 Muscle weakness (generalized): Secondary | ICD-10-CM

## 2023-09-21 DIAGNOSIS — M5459 Other low back pain: Secondary | ICD-10-CM

## 2023-09-25 ENCOUNTER — Ambulatory Visit: Payer: PPO

## 2023-09-25 DIAGNOSIS — R2681 Unsteadiness on feet: Secondary | ICD-10-CM

## 2023-09-25 DIAGNOSIS — M5459 Other low back pain: Secondary | ICD-10-CM | POA: Diagnosis not present

## 2023-09-25 DIAGNOSIS — R262 Difficulty in walking, not elsewhere classified: Secondary | ICD-10-CM

## 2023-09-25 DIAGNOSIS — M25551 Pain in right hip: Secondary | ICD-10-CM

## 2023-09-25 DIAGNOSIS — M542 Cervicalgia: Secondary | ICD-10-CM

## 2023-09-25 DIAGNOSIS — M6281 Muscle weakness (generalized): Secondary | ICD-10-CM

## 2023-09-25 DIAGNOSIS — G8929 Other chronic pain: Secondary | ICD-10-CM

## 2023-09-25 NOTE — Therapy (Signed)
 OUTPATIENT PHYSICAL THERAPY THORACOLUMBAR AND CERVICAL TREATMENT   Patient Name: Rebekah Hamilton MRN: [id number] DOB:[birth year], 78 y.o., female Today's Date: 09/25/2023  END OF SESSION:  PT End of Session - 09/25/23 0901     Visit Number 7    Number of Visits 24    Date for PT Re-Evaluation 11/28/23    Progress Note Due on Visit 10    PT Start Time 0853    PT Stop Time 0927    PT Time Calculation (min) 34 min    Activity Tolerance Patient tolerated treatment well    Behavior During Therapy [facility] for tasks assessed/performed                  Past Medical History:  Diagnosis Date   Allergy    Anemia    Anxiety    Depression    Hyperlipidemia    Hypertension    Lumbar stenosis    Migraines    Prediabetes    Past Surgical History:  Procedure Laterality Date   BREAST LUMPECTOMY Bilateral    CHOLECYSTECTOMY  1999   SPINE SURGERY     Patient Active Problem List   Diagnosis Date Noted   Cervical spondylosis 06/22/2023   Cerebral vascular disease 06/19/2023   Mild cognitive impairment 05/30/2023   Overweight with body mass index (BMI) of 28 to 28.9 in adult 12/05/2022   Postural dizziness with presyncope 12/05/2022   S/P lumbar fusion 11/12/2020   Chronic venous insufficiency 05/07/2019   History of 2019 novel coronavirus disease (COVID-19) 05/07/2019   Low back pain without sciatica 07/02/2018   OAB (overactive bladder) 04/02/2018   CKD (chronic kidney disease), symptom management only, stage 2 (mild) 09/24/2017   Glaucoma 06/02/2015   Vitamin D  deficiency 10/21/2014   Hyperlipidemia, mixed    Essential hypertension    Abnormal glucose    Migraines    Allergy    Anxiety    Depression, major, recurrent (HCC)    Lumbar stenosis     PCP: Vangie Genet, MD   REFERRING PROVIDER:   Phebe Brasil, MD    REFERRING DIAG:  G31.84 (ICD-10-CM) - Mild cognitive impairment  I67.9 (ICD-10-CM) - Cerebral vascular disease  M54.50 (ICD-10-CM) - Low back  pain without sciatica, unspecified back pain laterality, unspecified chronicity  M54.50,G89.29 (ICD-10-CM) - Chronic low back pain without sciatica, unspecified back pain laterality  M47.812 (ICD-10-CM) - Cervical spondylosis    Rationale for Evaluation and Treatment: Rehabilitation  THERAPY DIAG:  Other low back pain  Muscle weakness (generalized)  Cervicalgia  Difficulty in walking, not elsewhere classified  Chronic midline low back pain with left-sided sciatica  Unsteadiness on feet  Pain in right hip  ONSET DATE: 02/03/23  SUBJECTIVE:  SUBJECTIVE STATEMENT:  Pt reports doing okay- Feels some low back pain today and states one of her problems is her legs feel weak.    PERTINENT HISTORY:  HLD, HTN, anxiety, chronic kidney disease, chronic back pain, lumbar fusion, mild cognitive impairment  PAIN:  Are you having pain? Yes: NPRS scale: 5-6 Pain location: L ARM R LE Pain description: ache Aggravating factors: unsure Relieving factors: unsure  PRECAUTIONS: None  RED FLAGS: None   WEIGHT BEARING RESTRICTIONS: No  FALLS:  Has patient fallen in last 6 months? No  LIVING ENVIRONMENT: Lives with: lives alone Lives in: House/apartment Stairs: Yes: Internal: 4 steps; on right going up Has following equipment at home: Single point cane, Walker - 2 wheeled, and shower chair  OCCUPATION: retired   PLOF: Independent  PATIENT GOALS: Improve pain in her leg and arm   NEXT MD VISIT: Ask next visit   OBJECTIVE:  Note: Objective measures were completed at Evaluation unless otherwise noted.  DIAGNOSTIC FINDINGS:  N/a  PATIENT SURVEYS:  Modified Oswestry 12/50  FOTO 53  COGNITION: Overall cognitive status:  Mild cognitive impairment to listed in chart, patient has difficulty  following along with questions and answering some questions specifically      SENSATION: WFL  MUSCLE LENGTH: Hamstrings: Right 45 degrees deg; Left 35 degrees deg Thomas test: Right WNL deg; Left WNL deg  POSTURE: rounded shoulders, forward head, and decreased lumbar lordosis  PALPATION: Not assessed   LUMBAR ROM:   AROM eval  Flexion Hands to knees  Extension min  Right lateral flexion To knee lateral  Left lateral flexion To knee lateral  Right rotation   Left rotation    (Blank rows = not tested)    LOWER EXTREMITY MMT:    MMT Right eval Left eval  Hip flexion 4 4  Hip extension    Hip abduction 4 4  Hip adduction 4 4  Hip internal rotation    Hip external rotation    Knee flexion 4 4  Knee extension 4 4  Ankle dorsiflexion 4 4  Ankle plantarflexion    Ankle inversion    Ankle eversion     (Blank rows = not tested)  LUMBAR SPECIAL TESTS:  Straight leg raise test: Positive, FABER test: Negative, and Double leg lowering test negative   FUNCTIONAL TESTS:  5 times sit to stand: 15.53 sec    TREATMENT DATE: 09/25/23     THEREX:  Seated low back stretch rolling blue theraball out in front and to left diagonal x 6 with 30 sec hold.  Therapeutic Activities: FOR ROM and LE Strength to improve walking/standing: TrA contract with the following:  Seated hip march alt LE x 10  Seated hip add (squeeze on pillow- 3 sec hold) x 10  Seated knee ext alt LE x 10 reps  Seated ham curl with RTB x 10 reps  Sit to stand with VC for TrA contraction and No UE support x 10  Scapular mid row- RTB- x 12 reps (Visual cues for correct technique)      Extensive education regarding importance of core and postural muscle activation and strength for daily activities as well as education on sarcopenia and ways to combat age related muscle loss  PATIENT EDUCATION:  Education details: POC Person educated: Patient Education method: Explanation Education comprehension:  verbalized understanding   HOME EXERCISE PROGRAM: Access Code: [email or web address] URL: [email or web address] Date: 09/21/2023 Prepared by: Marlynn Singer  Exercises - Seated Hamstring Stretch  - 1 x  daily - 3 sets - 30-45 sec hold - Seated Shoulder Row with Anchored Resistance  - 3 x weekly - 3 sets - 10 reps - Seated Shoulder Horizontal Abduction with Resistance  - 1 x daily - 3 x weekly - 3 sets - 10 reps - Supine Chin Tuck  - 1 x daily - 7 x weekly - 2 sets - 10 reps - 5 sec hold - Supine Posterior Pelvic Tilt  - 1 x daily - 7 x weekly - 2 sets - 10 reps - 5 sec hold   ASSESSMENT:  CLINICAL IMPRESSION:  Treatment added some LE strengthening instruction and patient performed well- no report of any low back pain. She was responsive to all VC/visual demonstration to perform all activities correctly. Added Sit to stand to her HEP and instructed to ensure Transverse abdominal contraction. Patient will benefit from skilled physical therapy to address her low back and lower extremity radiculopathy as well as her cervical and left upper extremity pain and discomfort.  OBJECTIVE IMPAIRMENTS: decreased activity tolerance, decreased strength, hypomobility, impaired perceived functional ability, impaired UE functional use, and pain.   ACTIVITY LIMITATIONS: carrying, lifting, squatting, and locomotion level  PARTICIPATION LIMITATIONS: driving, shopping, and community activity  PERSONAL FACTORS: Age and 3+ comorbidities: HLD, HTN, anxiety, depression, chronic issues with low back pain  are also affecting patient's functional outcome.   REHAB POTENTIAL: Fair unsure of radicular causes of pain, especially at different spinal levels  CLINICAL DECISION MAKING: Evolving/moderate complexity  EVALUATION COMPLEXITY: Moderate   GOALS: Goals reviewed with patient? Yes  SHORT TERM GOALS: Target date: 09/26/2023    Patient will be independent in home exercise program to improve  strength/mobility for better functional independence with ADLs. Baseline: No HEP currently  Goal status: INITIAL  LONG TERM GOALS: Target date: 11/28/2023  1.  Patient (> 53 years old) will complete five times sit to stand test in < 12 seconds indicating an increased LE strength and power. Baseline: 15.5 sec with UE suppor ton 2 reps  Goal status: INITIAL  2.  Patient will increase FOTO score to equal to or greater than  57   to demonstrate statistically significant improvement in mobility and quality of life.  Baseline: 53 Goal status: INITIAL   3.  Patient will increase MODI questionnaire by 6 points or greater to indicate improved subjective level of physical function as a result of her back and leg pain  Baseline: 12 Goal status: INITIAL  4.  Patient will improve lower extremity strength in all major planes to 4+ out of 5 or greater particularly in her hip and knee musculature in order to indicate improvement in her lower extremity strength and stability Baseline: See eval Goal status: INITIAL       PLAN:  PT FREQUENCY: 1-2x/week  PT DURATION: 12 weeks  PLANNED INTERVENTIONS: 97110-Therapeutic exercises, 97530- Therapeutic activity, 97112- Neuromuscular re-education, 97535- Self Care, 40981- Manual therapy, Balance training, Dry Needling, and Moist heat.  PLAN FOR NEXT SESSION: cervical and lumbar radiculopathy treatment, L UE scapular and impingement involvement. Continue next visit with core/LE/Postural strengthening    Murlene Army, PT 09/25/2023, 10:40 AM

## 2023-09-27 ENCOUNTER — Ambulatory Visit: Payer: PPO | Admitting: Physical Therapy

## 2023-09-27 DIAGNOSIS — M5459 Other low back pain: Secondary | ICD-10-CM

## 2023-09-27 DIAGNOSIS — M542 Cervicalgia: Secondary | ICD-10-CM

## 2023-09-27 DIAGNOSIS — G8929 Other chronic pain: Secondary | ICD-10-CM

## 2023-09-27 DIAGNOSIS — R262 Difficulty in walking, not elsewhere classified: Secondary | ICD-10-CM

## 2023-09-27 DIAGNOSIS — M6281 Muscle weakness (generalized): Secondary | ICD-10-CM

## 2023-09-27 NOTE — Therapy (Addendum)
 OUTPATIENT PHYSICAL THERAPY THORACOLUMBAR AND CERVICAL TREATMENT   Patient Name: Rebekah Hamilton MRN: [id number] DOB:05-Sep-1945, 78 y.o., female Today's Date: 10/03/2023  END OF SESSION:   Visit Number 8   Number of Visits 24    Date for PT Re-Evaluation 11/28/23    Progress Note Due on Visit 10    PT Start Time 0848    PT Stop Time 0928   PT Time Calculation (min) 40 min    Activity Tolerance Patient tolerated treatment well    Behavior During Therapy [facility] for tasks assessed/performed          Past Medical History:  Diagnosis Date   Allergy    Anemia    Anxiety    Depression    Hyperlipidemia    Hypertension    Lumbar stenosis    Migraines    Prediabetes    Past Surgical History:  Procedure Laterality Date   BREAST LUMPECTOMY Bilateral    CHOLECYSTECTOMY  1999   SPINE SURGERY     Patient Active Problem List   Diagnosis Date Noted   Cervical spondylosis 06/22/2023   Cerebral vascular disease 06/19/2023   Mild cognitive impairment 05/30/2023   Overweight with body mass index (BMI) of 28 to 28.9 in adult 12/05/2022   Postural dizziness with presyncope 12/05/2022   S/P lumbar fusion 11/12/2020   Chronic venous insufficiency 05/07/2019   History of 2019 novel coronavirus disease (COVID-19) 05/07/2019   Low back pain without sciatica 07/02/2018   OAB (overactive bladder) 04/02/2018   CKD (chronic kidney disease), symptom management only, stage 2 (mild) 09/24/2017   Glaucoma 06/02/2015   Vitamin D deficiency 10/21/2014   Hyperlipidemia, mixed    Essential hypertension    Abnormal glucose    Migraines    Allergy    Anxiety    Depression, major, recurrent (HCC)    Lumbar stenosis     PCP: [name], MD   REFERRING PROVIDER:   Levert Feinstein, MD    REFERRING DIAG:  G31.84 (ICD-10-CM) - Mild cognitive impairment  I67.9 (ICD-10-CM) - Cerebral vascular disease  M54.50 (ICD-10-CM) - Low back pain without sciatica, unspecified back pain  laterality, unspecified chronicity  M54.50,G89.29 (ICD-10-CM) - Chronic low back pain without sciatica, unspecified back pain laterality  M47.812 (ICD-10-CM) - Cervical spondylosis    Rationale for Evaluation and Treatment: Rehabilitation  THERAPY DIAG:  Muscle weakness (generalized)  Other low back pain  Cervicalgia  Difficulty in walking, not elsewhere classified  Chronic midline low back pain with left-sided sciatica  ONSET DATE: 02/03/23  SUBJECTIVE:  SUBJECTIVE STATEMENT:  Pt reports she had a fall Monday afternoon, she reports she feels like she  "melted into the floor". She had to call for her brother to get himself up off the floor. She also reports her PCP passed away ( Dr. [name]).    PERTINENT HISTORY:  HLD, HTN, anxiety, chronic kidney disease, chronic back pain, lumbar fusion, mild cognitive impairment  PAIN:  Are you having pain? Yes: NPRS scale: 5-6 Pain location: L ARM R LE Pain description: ache Aggravating factors: unsure Relieving factors: unsure  PRECAUTIONS: None  RED FLAGS: None   WEIGHT BEARING RESTRICTIONS: No  FALLS:  Has patient fallen in last 6 months? No  LIVING ENVIRONMENT: Lives with: lives alone Lives in: House/apartment Stairs: Yes: Internal: 4 steps; on right going up Has following equipment at home: Single point cane, Walker - 2 wheeled, and shower chair  OCCUPATION: retired   PLOF: Independent  PATIENT GOALS: Improve pain in her leg and arm   NEXT MD VISIT: Ask next visit   OBJECTIVE:  Note: Objective measures were completed at Evaluation unless otherwise noted.  DIAGNOSTIC FINDINGS:  N/a  PATIENT SURVEYS:  Modified Oswestry 12/50  FOTO 53  COGNITION: Overall cognitive status:  Mild cognitive impairment to listed in  chart, patient has difficulty following along with questions and answering some questions specifically      SENSATION: WFL  MUSCLE LENGTH: Hamstrings: Right 45 degrees deg; Left 35 degrees deg Thomas test: Right WNL deg; Left WNL deg  POSTURE: rounded shoulders, forward head, and decreased lumbar lordosis  PALPATION: Not assessed   LUMBAR ROM:   AROM eval  Flexion Hands to knees  Extension min  Right lateral flexion To knee lateral  Left lateral flexion To knee lateral  Right rotation   Left rotation    (Blank rows = not tested)    LOWER EXTREMITY MMT:    MMT Right eval Left eval  Hip flexion 4 4  Hip extension    Hip abduction 4 4  Hip adduction 4 4  Hip internal rotation    Hip external rotation    Knee flexion 4 4  Knee extension 4 4  Ankle dorsiflexion 4 4  Ankle plantarflexion    Ankle inversion    Ankle eversion     (Blank rows = not tested)  LUMBAR SPECIAL TESTS:  Straight leg raise test: Positive, FABER test: Negative, and Double leg lowering test negative   FUNCTIONAL TESTS:  5 times sit to stand: 15.53 sec    TREATMENT DATE: 10/03/23     THEREX:  Seated low back stretch rolling blue theraball out in front and to left/right diagonal x 5 ea with 5 sec hold. Nustep B UE and LE x 6 min level 1-3 Seated Hamstring Stretch x 30 sec, pt reports some discofmort so completed another set with A from pt in supine with better tolerance.   Therapeutic Activities: FOR ROM and LE Strength to improve walking/standing: TrA contract with the following:  Seated hip march alt LE x 10  Seated knee ext alt LE x 10 reps  Seated ham curl with RTB x 10 reps   Pt noted to be feeling off this date and weak and tired, Pt assedd BP as a result to ensure BP: 123/75  mmHg with HR 83  Extensive education regarding importance of core and postural muscle activation and strength for daily activities   PATIENT EDUCATION:  Education details: POC Person educated:  Patient Education method: Explanation  Education comprehension: verbalized understanding   HOME EXERCISE PROGRAM: Access Code: [email or web address] URL: [email or web address] Date: 09/21/2023 Prepared by: Thresa Ross  Exercises - Seated Hamstring Stretch  - 1 x daily - 3 sets - 30-45 sec hold - Seated Shoulder Row with Anchored Resistance  - 3 x weekly - 3 sets - 10 reps - Seated Shoulder Horizontal Abduction with Resistance  - 1 x daily - 3 x weekly - 3 sets - 10 reps - Supine Chin Tuck  - 1 x daily - 7 x weekly - 2 sets - 10 reps - 5 sec hold - Supine Posterior Pelvic Tilt  - 1 x daily - 7 x weekly - 2 sets - 10 reps - 5 sec hold   ASSESSMENT:  CLINICAL IMPRESSION:  Pt feeling weak and off today but was still amicable to PT activities. Pt instructed in hamstring stretch in supine at home as she did not tolerate seated activity. Pt BP WNL when assessed today but pt generally feeling tired and weak and had a fall of unknown cause since last visit. Will continue to progress as pt tolerates in future session.   OBJECTIVE IMPAIRMENTS: decreased activity tolerance, decreased strength, hypomobility, impaired perceived functional ability, impaired UE functional use, and pain.   ACTIVITY LIMITATIONS: carrying, lifting, squatting, and locomotion level  PARTICIPATION LIMITATIONS: driving, shopping, and community activity  PERSONAL FACTORS: Age and 3+ comorbidities: HLD, HTN, anxiety, depression, chronic issues with low back pain  are also affecting patient's functional outcome.   REHAB POTENTIAL: Fair unsure of radicular causes of pain, especially at different spinal levels  CLINICAL DECISION MAKING: Evolving/moderate complexity  EVALUATION COMPLEXITY: Moderate   GOALS: Goals reviewed with patient? Yes  SHORT TERM GOALS: Target date: 09/26/2023    Patient will be independent in home exercise program to improve strength/mobility for better functional independence with  ADLs. Baseline: No HEP currently  Goal status: INITIAL  LONG TERM GOALS: Target date: 11/28/2023  1.  Patient (> 54 years old) will complete five times sit to stand test in < 12 seconds indicating an increased LE strength and power. Baseline: 15.5 sec with UE suppor ton 2 reps  Goal status: INITIAL  2.  Patient will increase FOTO score to equal to or greater than  57   to demonstrate statistically significant improvement in mobility and quality of life.  Baseline: 53 Goal status: INITIAL   3.  Patient will increase MODI questionnaire by 6 points or greater to indicate improved subjective level of physical function as a result of her back and leg pain  Baseline: 12 Goal status: INITIAL  4.  Patient will improve lower extremity strength in all major planes to 4+ out of 5 or greater particularly in her hip and knee musculature in order to indicate improvement in her lower extremity strength and stability Baseline: See eval Goal status: INITIAL       PLAN:  PT FREQUENCY: 1-2x/week  PT DURATION: 12 weeks  PLANNED INTERVENTIONS: 97110-Therapeutic exercises, 97530- Therapeutic activity, 97112- Neuromuscular re-education, 97535- Self Care, 54098- Manual therapy, Balance training, Dry Needling, and Moist heat.  PLAN FOR NEXT SESSION: cervical and lumbar radiculopathy treatment, L UE scapular and impingement involvement. Continue next visit with core/LE/Postural strengthening    Norman Herrlich, PT 10/03/2023, 9:21 AM

## 2023-09-28 ENCOUNTER — Ambulatory Visit: Payer: Self-pay | Admitting: Nurse Practitioner

## 2023-10-03 ENCOUNTER — Ambulatory Visit: Payer: PPO | Admitting: Physical Therapy

## 2023-10-03 DIAGNOSIS — L821 Other seborrheic keratosis: Secondary | ICD-10-CM | POA: Diagnosis not present

## 2023-10-03 DIAGNOSIS — Z85828 Personal history of other malignant neoplasm of skin: Secondary | ICD-10-CM | POA: Diagnosis not present

## 2023-10-03 DIAGNOSIS — L304 Erythema intertrigo: Secondary | ICD-10-CM | POA: Diagnosis not present

## 2023-10-03 DIAGNOSIS — Z08 Encounter for follow-up examination after completed treatment for malignant neoplasm: Secondary | ICD-10-CM | POA: Diagnosis not present

## 2023-10-03 DIAGNOSIS — D2272 Melanocytic nevi of left lower limb, including hip: Secondary | ICD-10-CM | POA: Diagnosis not present

## 2023-10-03 DIAGNOSIS — M5459 Other low back pain: Secondary | ICD-10-CM | POA: Diagnosis not present

## 2023-10-03 DIAGNOSIS — D2271 Melanocytic nevi of right lower limb, including hip: Secondary | ICD-10-CM | POA: Diagnosis not present

## 2023-10-03 DIAGNOSIS — M542 Cervicalgia: Secondary | ICD-10-CM

## 2023-10-03 DIAGNOSIS — D2262 Melanocytic nevi of left upper limb, including shoulder: Secondary | ICD-10-CM | POA: Diagnosis not present

## 2023-10-03 DIAGNOSIS — M6281 Muscle weakness (generalized): Secondary | ICD-10-CM

## 2023-10-03 DIAGNOSIS — D2261 Melanocytic nevi of right upper limb, including shoulder: Secondary | ICD-10-CM | POA: Diagnosis not present

## 2023-10-03 DIAGNOSIS — D225 Melanocytic nevi of trunk: Secondary | ICD-10-CM | POA: Diagnosis not present

## 2023-10-03 NOTE — Therapy (Signed)
 OUTPATIENT PHYSICAL THERAPY THORACOLUMBAR AND CERVICAL TREATMENT   Patient Name: Rebekah Hamilton MRN: [id number] DOB:1946/07/11, 78 y.o., female Today's Date: 10/03/2023  END OF SESSION:  PT End of Session - 10/03/23 0920     Visit Number 9    Number of Visits 24    Date for PT Re-Evaluation 11/28/23    Progress Note Due on Visit 10    PT Start Time 0930    PT Stop Time 1010    PT Time Calculation (min) 40 min    Activity Tolerance Patient tolerated treatment well    Behavior During Therapy [facility] for tasks assessed/performed                   Past Medical History:  Diagnosis Date   Allergy    Anemia    Anxiety    Depression    Hyperlipidemia    Hypertension    Lumbar stenosis    Migraines    Prediabetes    Past Surgical History:  Procedure Laterality Date   BREAST LUMPECTOMY Bilateral    CHOLECYSTECTOMY  1999   SPINE SURGERY     Patient Active Problem List   Diagnosis Date Noted   Cervical spondylosis 06/22/2023   Cerebral vascular disease 06/19/2023   Mild cognitive impairment 05/30/2023   Overweight with body mass index (BMI) of 28 to 28.9 in adult 12/05/2022   Postural dizziness with presyncope 12/05/2022   S/P lumbar fusion 11/12/2020   Chronic venous insufficiency 05/07/2019   History of 2019 novel coronavirus disease (COVID-19) 05/07/2019   Low back pain without sciatica 07/02/2018   OAB (overactive bladder) 04/02/2018   CKD (chronic kidney disease), symptom management only, stage 2 (mild) 09/24/2017   Glaucoma 06/02/2015   Vitamin D deficiency 10/21/2014   Hyperlipidemia, mixed    Essential hypertension    Abnormal glucose    Migraines    Allergy    Anxiety    Depression, major, recurrent (HCC)    Lumbar stenosis     PCP: [name], MD   REFERRING PROVIDER:   Levert Feinstein, MD    REFERRING DIAG:  G31.84 (ICD-10-CM) - Mild cognitive impairment  I67.9 (ICD-10-CM) - Cerebral vascular disease  M54.50 (ICD-10-CM) - Low  back pain without sciatica, unspecified back pain laterality, unspecified chronicity  M54.50,G89.29 (ICD-10-CM) - Chronic low back pain without sciatica, unspecified back pain laterality  M47.812 (ICD-10-CM) - Cervical spondylosis    Rationale for Evaluation and Treatment: Rehabilitation  THERAPY DIAG:  Muscle weakness (generalized)  Other low back pain  Cervicalgia  ONSET DATE: 02/03/23  SUBJECTIVE:  SUBJECTIVE STATEMENT:  Pt reports she is doing well, co c/o of neck or back pain at this time.    PERTINENT HISTORY:  HLD, HTN, anxiety, chronic kidney disease, chronic back pain, lumbar fusion, mild cognitive impairment  PAIN:  Are you having pain? Yes: NPRS scale: 5-6 Pain location: L ARM R LE Pain description: ache Aggravating factors: unsure Relieving factors: unsure  PRECAUTIONS: None  RED FLAGS: None   WEIGHT BEARING RESTRICTIONS: No  FALLS:  Has patient fallen in last 6 months? No  LIVING ENVIRONMENT: Lives with: lives alone Lives in: House/apartment Stairs: Yes: Internal: 4 steps; on right going up Has following equipment at home: Single point cane, Walker - 2 wheeled, and shower chair  OCCUPATION: retired   PLOF: Independent  PATIENT GOALS: Improve pain in her leg and arm   NEXT MD VISIT: Ask next visit   OBJECTIVE:  Note: Objective measures were completed at Evaluation unless otherwise noted.  DIAGNOSTIC FINDINGS:  N/a  PATIENT SURVEYS:  Modified Oswestry 12/50  FOTO 53  COGNITION: Overall cognitive status:  Mild cognitive impairment to listed in chart, patient has difficulty following along with questions and answering some questions specifically      SENSATION: WFL  MUSCLE LENGTH: Hamstrings: Right 45 degrees deg; Left 35 degrees deg Thomas test: Right  WNL deg; Left WNL deg  POSTURE: rounded shoulders, forward head, and decreased lumbar lordosis  PALPATION: Not assessed   LUMBAR ROM:   AROM eval  Flexion Hands to knees  Extension min  Right lateral flexion To knee lateral  Left lateral flexion To knee lateral  Right rotation   Left rotation    (Blank rows = not tested)    LOWER EXTREMITY MMT:    MMT Right eval Left eval  Hip flexion 4 4  Hip extension    Hip abduction 4 4  Hip adduction 4 4  Hip internal rotation    Hip external rotation    Knee flexion 4 4  Knee extension 4 4  Ankle dorsiflexion 4 4  Ankle plantarflexion    Ankle inversion    Ankle eversion     (Blank rows = not tested)  LUMBAR SPECIAL TESTS:  Straight leg raise test: Positive, FABER test: Negative, and Double leg lowering test negative   FUNCTIONAL TESTS:  5 times sit to stand: 15.53 sec    TREATMENT DATE: 10/03/23     THEREX:  Nustep B UE and LE x 6 min level 3 Seated low back stretch rolling blue theraball out in front and to left/right diagonal x 5 ea with 10 sec hold. Supine hamstring stretch with overpressure by pt 2 x 45 sec ea, increased tension on R 2 x 10 HS curl with pball  Lower trunk rotation x 10 ea x 5 sec hold   Therapeutic Activities: FOR ROM and LE Strength to improve walking/standing: TrA contract with the following - sitting on dynadisc for advanced core control   -Seated hip march alt LE x 10 with 2# AW    -Seated knee ext alt LE x 10 reps with 2# AW   -Seated ham curl with RTB x 10 reps with 2# AW   Education regarding importance of core and postural muscle activation and strength for daily activities   PATIENT EDUCATION:  Education details: POC Person educated: Patient Education method: Explanation Education comprehension: verbalized understanding   HOME EXERCISE PROGRAM: Access Code: [email or web address] URL: [email or web address] Date: 09/21/2023 Prepared by: Thresa Ross  Exercises -  Seated Hamstring  Stretch  - 1 x daily - 3 sets - 30-45 sec hold - Seated Shoulder Row with Anchored Resistance  - 3 x weekly - 3 sets - 10 reps - Seated Shoulder Horizontal Abduction with Resistance  - 1 x daily - 3 x weekly - 3 sets - 10 reps - Supine Chin Tuck  - 1 x daily - 7 x weekly - 2 sets - 10 reps - 5 sec hold - Supine Posterior Pelvic Tilt  - 1 x daily - 7 x weekly - 2 sets - 10 reps - 5 sec hold   ASSESSMENT:  CLINICAL IMPRESSION:  Continued with current plan of care as laid out in evaluation and recent prior sessions. Pt remains motivated to advance progress toward goals in order to maximize independence and safety at home. Pt requires high level assistance and cuing for completion of exercises in order to provide adequate level of stimulation challenge while minimizing pain and discomfort when possible. Pt closely monitored throughout session pt response and to maximize patient safety during interventions. Pt continues to demonstrate progress toward goals AEB progression of interventions this date either in volume or intensity.   OBJECTIVE IMPAIRMENTS: decreased activity tolerance, decreased strength, hypomobility, impaired perceived functional ability, impaired UE functional use, and pain.   ACTIVITY LIMITATIONS: carrying, lifting, squatting, and locomotion level  PARTICIPATION LIMITATIONS: driving, shopping, and community activity  PERSONAL FACTORS: Age and 3+ comorbidities: HLD, HTN, anxiety, depression, chronic issues with low back pain  are also affecting patient's functional outcome.   REHAB POTENTIAL: Fair unsure of radicular causes of pain, especially at different spinal levels  CLINICAL DECISION MAKING: Evolving/moderate complexity  EVALUATION COMPLEXITY: Moderate   GOALS: Goals reviewed with patient? Yes  SHORT TERM GOALS: Target date: 09/26/2023    Patient will be independent in home exercise program to improve strength/mobility for better functional  independence with ADLs. Baseline: No HEP currently  Goal status: INITIAL  LONG TERM GOALS: Target date: 11/28/2023  1.  Patient (> 69 years old) will complete five times sit to stand test in < 12 seconds indicating an increased LE strength and power. Baseline: 15.5 sec with UE suppor ton 2 reps  Goal status: INITIAL  2.  Patient will increase FOTO score to equal to or greater than  57   to demonstrate statistically significant improvement in mobility and quality of life.  Baseline: 53 Goal status: INITIAL   3.  Patient will increase MODI questionnaire by 6 points or greater to indicate improved subjective level of physical function as a result of her back and leg pain  Baseline: 12 Goal status: INITIAL  4.  Patient will improve lower extremity strength in all major planes to 4+ out of 5 or greater particularly in her hip and knee musculature in order to indicate improvement in her lower extremity strength and stability Baseline: See eval Goal status: INITIAL       PLAN:  PT FREQUENCY: 1-2x/week  PT DURATION: 12 weeks  PLANNED INTERVENTIONS: 97110-Therapeutic exercises, 97530- Therapeutic activity, 97112- Neuromuscular re-education, 97535- Self Care, 16109- Manual therapy, Balance training, Dry Needling, and Moist heat.  PLAN FOR NEXT SESSION: cervical and lumbar radiculopathy treatment, L UE scapular and impingement involvement. Continue next visit with core/LE/Postural strengthening    Norman Herrlich, PT 10/03/2023, 9:20 AM

## 2023-10-05 NOTE — Therapy (Signed)
 OUTPATIENT PHYSICAL THERAPY THORACOLUMBAR AND CERVICAL TREATMENT/Physical Therapy Progress Note   Dates of reporting period  09/05/2023   to   10/06/2023   Patient Name: Rebekah Hamilton MRN: [id number] DOB:05-16-1946, 78 y.o., female Today's Date: 10/05/2023  END OF SESSION:          Past Medical History:  Diagnosis Date   Allergy    Anemia    Anxiety    Depression    Hyperlipidemia    Hypertension    Lumbar stenosis    Migraines    Prediabetes    Past Surgical History:  Procedure Laterality Date   BREAST LUMPECTOMY Bilateral    CHOLECYSTECTOMY  1999   SPINE SURGERY     Patient Active Problem List   Diagnosis Date Noted   Cervical spondylosis 06/22/2023   Cerebral vascular disease 06/19/2023   Mild cognitive impairment 05/30/2023   Overweight with body mass index (BMI) of 28 to 28.9 in adult 12/05/2022   Postural dizziness with presyncope 12/05/2022   S/P lumbar fusion 11/12/2020   Chronic venous insufficiency 05/07/2019   History of 2019 novel coronavirus disease (COVID-19) 05/07/2019   Low back pain without sciatica 07/02/2018   OAB (overactive bladder) 04/02/2018   CKD (chronic kidney disease), symptom management only, stage 2 (mild) 09/24/2017   Glaucoma 06/02/2015   Vitamin D deficiency 10/21/2014   Hyperlipidemia, mixed    Essential hypertension    Abnormal glucose    Migraines    Allergy    Anxiety    Depression, major, recurrent (HCC)    Lumbar stenosis     PCP: [name], MD   REFERRING PROVIDER:   Levert Feinstein, MD    REFERRING DIAG:  G31.84 (ICD-10-CM) - Mild cognitive impairment  I67.9 (ICD-10-CM) - Cerebral vascular disease  M54.50 (ICD-10-CM) - Low back pain without sciatica, unspecified back pain laterality, unspecified chronicity  M54.50,G89.29 (ICD-10-CM) - Chronic low back pain without sciatica, unspecified back pain laterality  M47.812 (ICD-10-CM) - Cervical spondylosis    Rationale for Evaluation and Treatment:  Rehabilitation  THERAPY DIAG:  No diagnosis found.  ONSET DATE: 02/03/23  SUBJECTIVE:                                                                                                                                                                                           SUBJECTIVE STATEMENT:  *** Pt reports she is doing well, co c/o of neck or back pain at this time.    PERTINENT HISTORY:  HLD, HTN, anxiety, chronic kidney disease, chronic back pain, lumbar fusion, mild cognitive impairment  PAIN:  Are you having pain? Yes: NPRS scale:  5-6 Pain location: L ARM R LE Pain description: ache Aggravating factors: unsure Relieving factors: unsure  PRECAUTIONS: None  RED FLAGS: None   WEIGHT BEARING RESTRICTIONS: No  FALLS:  Has patient fallen in last 6 months? No  LIVING ENVIRONMENT: Lives with: lives alone Lives in: House/apartment Stairs: Yes: Internal: 4 steps; on right going up Has following equipment at home: Single point cane, Walker - 2 wheeled, and shower chair  OCCUPATION: retired   PLOF: Independent  PATIENT GOALS: Improve pain in her leg and arm   NEXT MD VISIT: Ask next visit   OBJECTIVE:  Note: Objective measures were completed at Evaluation unless otherwise noted.  DIAGNOSTIC FINDINGS:  N/a  PATIENT SURVEYS:  Modified Oswestry 12/50  FOTO 53  COGNITION: Overall cognitive status:  Mild cognitive impairment to listed in chart, patient has difficulty following along with questions and answering some questions specifically      SENSATION: WFL  MUSCLE LENGTH: Hamstrings: Right 45 degrees deg; Left 35 degrees deg Thomas test: Right WNL deg; Left WNL deg  POSTURE: rounded shoulders, forward head, and decreased lumbar lordosis  PALPATION: Not assessed   LUMBAR ROM:   AROM eval  Flexion Hands to knees  Extension min  Right lateral flexion To knee lateral  Left lateral flexion To knee lateral  Right rotation   Left rotation    (Blank  rows = not tested)    LOWER EXTREMITY MMT:    MMT Right eval Left eval  Hip flexion 4 4  Hip extension    Hip abduction 4 4  Hip adduction 4 4  Hip internal rotation    Hip external rotation    Knee flexion 4 4  Knee extension 4 4  Ankle dorsiflexion 4 4  Ankle plantarflexion    Ankle inversion    Ankle eversion     (Blank rows = not tested)  LUMBAR SPECIAL TESTS:  Straight leg raise test: Positive, FABER test: Negative, and Double leg lowering test negative   FUNCTIONAL TESTS:  5 times sit to stand: 15.53 sec    TREATMENT DATE: 10/05/23  Physical therapy treatment session today consisted of completing assessment of goals and administration of testing as demonstrated and documented in flow sheet, treatment, and goals section of this note. Addition treatments may be found below.    THEREX:  Nustep B UE and LE x 6 min level 3 Seated low back stretch rolling blue theraball out in front and to left/right diagonal x 5 ea with 10 sec hold. Supine hamstring stretch with overpressure by pt 2 x 45 sec ea, increased tension on R 2 x 10 HS curl with pball  Lower trunk rotation x 10 ea x 5 sec hold   Therapeutic Activities: FOR ROM and LE Strength to improve walking/standing: TrA contract with the following - sitting on dynadisc for advanced core control   -Seated hip march alt LE x 10 with 2# AW    -Seated knee ext alt LE x 10 reps with 2# AW   -Seated ham curl with RTB x 10 reps with 2# AW   Education regarding importance of core and postural muscle activation and strength for daily activities   PATIENT EDUCATION:  Education details: POC Person educated: Patient Education method: Explanation Education comprehension: verbalized understanding   HOME EXERCISE PROGRAM: Access Code: [email or web address] URL: [email or web address] Date: 09/21/2023 Prepared by: Thresa Ross  Exercises - Seated Hamstring Stretch  - 1 x daily - 3 sets - 30-45 sec hold - Seated  Shoulder Row with Anchored Resistance  - 3 x weekly - 3 sets - 10 reps - Seated Shoulder Horizontal Abduction with Resistance  - 1 x daily - 3 x weekly - 3 sets - 10 reps - Supine Chin Tuck  - 1 x daily - 7 x weekly - 2 sets - 10 reps - 5 sec hold - Supine Posterior Pelvic Tilt  - 1 x daily - 7 x weekly - 2 sets - 10 reps - 5 sec hold   ASSESSMENT:  CLINICAL IMPRESSION:  *** Continued with current plan of care as laid out in evaluation and recent prior sessions. Pt remains motivated to advance progress toward goals in order to maximize independence and safety at home. Pt requires high level assistance and cuing for completion of exercises in order to provide adequate level of stimulation challenge while minimizing pain and discomfort when possible. Pt closely monitored throughout session pt response and to maximize patient safety during interventions. Pt continues to demonstrate progress toward goals AEB progression of interventions this date either in volume or intensity.   OBJECTIVE IMPAIRMENTS: decreased activity tolerance, decreased strength, hypomobility, impaired perceived functional ability, impaired UE functional use, and pain.   ACTIVITY LIMITATIONS: carrying, lifting, squatting, and locomotion level  PARTICIPATION LIMITATIONS: driving, shopping, and community activity  PERSONAL FACTORS: Age and 3+ comorbidities: HLD, HTN, anxiety, depression, chronic issues with low back pain  are also affecting patient's functional outcome.   REHAB POTENTIAL: Fair unsure of radicular causes of pain, especially at different spinal levels  CLINICAL DECISION MAKING: Evolving/moderate complexity  EVALUATION COMPLEXITY: Moderate   GOALS: Goals reviewed with patient? Yes  SHORT TERM GOALS: Target date: 09/26/2023    Patient will be independent in home exercise program to improve strength/mobility for better functional independence with ADLs. Baseline: No HEP currently  Goal status:  INITIAL  LONG TERM GOALS: Target date: 11/28/2023  1.  Patient (> 6 years old) will complete five times sit to stand test in < 12 seconds indicating an increased LE strength and power. Baseline: 15.5 sec with UE suppor ton 2 reps  Goal status: INITIAL  2.  Patient will increase FOTO score to equal to or greater than  57   to demonstrate statistically significant improvement in mobility and quality of life.  Baseline: 53 Goal status: INITIAL   3.  Patient will increase MODI questionnaire by 6 points or greater to indicate improved subjective level of physical function as a result of her back and leg pain  Baseline: 12 Goal status: INITIAL  4.  Patient will improve lower extremity strength in all major planes to 4+ out of 5 or greater particularly in her hip and knee musculature in order to indicate improvement in her lower extremity strength and stability Baseline: See eval Goal status: INITIAL       PLAN:  PT FREQUENCY: 1-2x/week  PT DURATION: 12 weeks  PLANNED INTERVENTIONS: 97110-Therapeutic exercises, 97530- Therapeutic activity, 97112- Neuromuscular re-education, 97535- Self Care, 16109- Manual therapy, Balance training, Dry Needling, and Moist heat.  PLAN FOR NEXT SESSION: cervical and lumbar radiculopathy treatment, L UE scapular and impingement involvement. Continue next visit with core/LE/Postural strengthening    Lenda Kelp, PT 10/05/2023, 10:45 PM

## 2023-10-06 ENCOUNTER — Ambulatory Visit: Payer: PPO

## 2023-10-06 DIAGNOSIS — M5459 Other low back pain: Secondary | ICD-10-CM

## 2023-10-06 DIAGNOSIS — R2681 Unsteadiness on feet: Secondary | ICD-10-CM

## 2023-10-06 DIAGNOSIS — M542 Cervicalgia: Secondary | ICD-10-CM

## 2023-10-06 DIAGNOSIS — G8929 Other chronic pain: Secondary | ICD-10-CM

## 2023-10-06 DIAGNOSIS — R262 Difficulty in walking, not elsewhere classified: Secondary | ICD-10-CM

## 2023-10-06 DIAGNOSIS — M6281 Muscle weakness (generalized): Secondary | ICD-10-CM

## 2023-10-09 ENCOUNTER — Ambulatory Visit: Payer: PPO | Admitting: Physical Therapy

## 2023-10-11 ENCOUNTER — Ambulatory Visit: Payer: PPO | Admitting: Physical Therapy

## 2023-10-16 ENCOUNTER — Ambulatory Visit: Payer: PPO

## 2023-10-18 ENCOUNTER — Ambulatory Visit: Payer: PPO | Admitting: Physical Therapy

## 2023-10-23 ENCOUNTER — Ambulatory Visit: Payer: PPO | Admitting: Physical Therapy

## 2023-10-25 ENCOUNTER — Encounter: Payer: HMO | Admitting: Physical Therapy

## 2023-10-30 ENCOUNTER — Encounter: Payer: HMO | Admitting: Physical Therapy

## 2023-11-01 ENCOUNTER — Encounter: Payer: HMO | Admitting: Physical Therapy

## 2023-11-06 ENCOUNTER — Encounter: Payer: HMO | Admitting: Physical Therapy

## 2023-11-07 ENCOUNTER — Ambulatory Visit: Payer: PPO | Admitting: Physician Assistant

## 2023-11-07 ENCOUNTER — Encounter: Payer: Self-pay | Admitting: Physician Assistant

## 2023-11-07 VITALS — BP 118/75 | HR 71 | Ht 64.17 in | Wt 157.0 lb

## 2023-11-07 DIAGNOSIS — I10 Essential (primary) hypertension: Secondary | ICD-10-CM | POA: Diagnosis not present

## 2023-11-07 DIAGNOSIS — Z7689 Persons encountering health services in other specified circumstances: Secondary | ICD-10-CM

## 2023-11-07 DIAGNOSIS — G8929 Other chronic pain: Secondary | ICD-10-CM

## 2023-11-07 DIAGNOSIS — R5383 Other fatigue: Secondary | ICD-10-CM | POA: Diagnosis not present

## 2023-11-07 DIAGNOSIS — E782 Mixed hyperlipidemia: Secondary | ICD-10-CM

## 2023-11-07 DIAGNOSIS — M545 Low back pain, unspecified: Secondary | ICD-10-CM

## 2023-11-07 DIAGNOSIS — G43009 Migraine without aura, not intractable, without status migrainosus: Secondary | ICD-10-CM

## 2023-11-07 DIAGNOSIS — R413 Other amnesia: Secondary | ICD-10-CM

## 2023-11-07 DIAGNOSIS — F339 Major depressive disorder, recurrent, unspecified: Secondary | ICD-10-CM

## 2023-11-07 NOTE — Progress Notes (Unsigned)
 New patient visit  Patient: Rebekah Hamilton   DOB: 12-02-1945   78 y.o. Female  MRN: [id number] Visit Date: 11/07/2023  Today's healthcare provider: Debera Lat, PA-C   Chief Complaint  Patient presents with   New Patient (Initial Visit)    Medication and refill requests    Depression    wanting to discuss her depression that she started feeling 6-8 months ago after the death of her divorced husband    Back Pain    Chronic back pain that she has had surgery on previously but would like to go other management   Subjective    Rebekah Hamilton is a 78 y.o. female who presents today as a new patient to establish care.  HPI HPI     New Patient (Initial Visit)    Additional comments: Medication and refill requests         Depression    Additional comments: wanting to discuss her depression that she started feeling 6-8 months ago after the death of her divorced husband         Back Pain    Additional comments: Chronic back pain that she has had surgery on previously but would like to go other management      Last edited by Allayne Stack on 11/07/2023 10:05 AM.      *** Discussed the use of AI scribe software for clinical note transcription with the patient, who gave verbal consent to proceed.  History of Present Illness     Past Medical History:  Diagnosis Date   Allergy    Anemia    Anxiety    Depression    Hyperlipidemia    Hypertension    Lumbar stenosis    Migraines    Prediabetes    Past Surgical History:  Procedure Laterality Date   BREAST LUMPECTOMY Bilateral    CHOLECYSTECTOMY  1999   SPINE SURGERY     Family Status  Relation Name Status   Mother  Deceased   Father  Deceased   Brother  Alive   Brother  Alive   Brother  Alive   Brother  Alive   MGM  Deceased   MGF  Deceased   PGM  Deceased   PGF  Deceased  No partnership data on file   Family History  Problem Relation Age of Onset   Stroke Mother    Hypertension Mother     Heart attack Father 38   Other Maternal Grandmother        blood clot after surgery   Heart attack Paternal Grandmother    Social History   Socioeconomic History   Marital status: Widowed    Spouse name: Not on file   Number of children: 2   Years of education: Not on file   Highest education level: Not on file  Occupational History   Occupation: accountant  Tobacco Use   Smoking status: Never   Smokeless tobacco: Never  Vaping Use   Vaping status: Never Used  Substance and Sexual Activity   Alcohol use: Yes    Alcohol/week: 2.0 standard drinks of alcohol    Types: 2 Glasses of wine per week   Drug use: No   Sexual activity: Not Currently  Other Topics Concern   Not on file  Social History Narrative   Not on file   Social Drivers of Health   Financial Resource Strain: Low Risk  (11/07/2023)   Overall Financial Resource Strain (CARDIA)  Difficulty of Paying Living Expenses: Not hard at all  Food Insecurity: No Food Insecurity (11/07/2023)   Hunger Vital Sign    Worried About Running Out of Food in the Last Year: Never true    Ran Out of Food in the Last Year: Never true  Transportation Needs: No Transportation Needs (08/19/2021)   PRAPARE - Administrator, Civil Service (Medical): No    Lack of Transportation (Non-Medical): No  Physical Activity: Insufficiently Active (11/07/2023)   Exercise Vital Sign    Days of Exercise per Week: 1 day    Minutes of Exercise per Session: 30 min  Stress: Stress Concern Present (11/07/2023)   [employer] of Occupational Health - Occupational Stress Questionnaire    Feeling of Stress : Rather much  Social Connections: Not on file   Outpatient Medications Prior to Visit  Medication Sig Note   Cholecalciferol (VITAMIN D PO) Take by mouth. Takes 40981 to 12000 units daily.    fexofenadine (ALLEGRA) 180 MG tablet Take 180 mg by mouth daily.    pravastatin (PRAVACHOL) 40 MG tablet Take one tablet at bedtime     progesterone (PROMETRIUM) 100 MG capsule Take  1 capsule  Daily    Ascorbic Acid (VITAMIN C PO) 500 mg 2 (two) times daily. (Patient not taking: Reported on 04/05/2023) 11/07/2023: Pt reports taking daily but not sure of amount   aspirin-acetaminophen-caffeine (EXCEDRIN EXTRA STRENGTH) 250-250-65 MG tablet Take 1 tablet by mouth once as needed.    buPROPion (WELLBUTRIN XL) 150 MG 24 hr tablet Take 1 tablet every Morning for Mood, Focus & Concentration (Patient not taking: Reported on 11/07/2023)    DULoxetine (CYMBALTA) 30 MG capsule TAKE 3 CAPSULES BY MOUTH DAILY FOR MOOD AND CHRONIC PAIN (Patient not taking: Reported on 11/07/2023) 11/07/2023: Wants to dicuss wether she should continue taking, has not been at this time   hydrochlorothiazide (HYDRODIURIL) 25 MG tablet Take 1/2 -1 tablet daily for BP and fluid for goal <130/80. (Patient not taking: Reported on 11/07/2023)    Ubrogepant (UBRELVY) 50 MG TABS Take 1 tab at onset of migraine.  May repeat in 2 hrs, if needed.  Max dose: 2 tabs/day. This is a 30 day prescription. (Patient not taking: Reported on 11/07/2023)    No facility-administered medications prior to visit.   Allergies  Allergen Reactions   Augmentin [Amoxicillin-Pot Clavulanate] Diarrhea   Doxycycline Nausea Only   Egg White (Diagnostic) Nausea Only   Prozac [Fluoxetine Hcl] Other (See Comments)    Disoriented    Immunization History  Administered Date(s) Administered   Influenza, High Dose Seasonal PF 05/29/2014, 06/02/2015, 06/16/2017, 05/09/2018, 05/09/2019   Influenza,inj,quad, With Preservative 08/12/2013, 07/04/2016   Influenza-Unspecified 05/25/2020   PFIZER(Purple Top)SARS-COV-2 Vaccination 09/30/2019, 10/21/2019, 06/22/2020, 02/06/2021   PNEUMOCOCCAL CONJUGATE-20 02/06/2021   Pneumococcal Conjugate-13 04/07/2015   Pneumococcal Polysaccharide-23 05/29/2013   Td 08/15/2005, 07/04/2016    Health Maintenance  Topic Date Due   Zoster Vaccines- Shingrix (1 of 2) Never  done   INFLUENZA VACCINE  03/16/2023   COVID-19 Vaccine (5 - 2024-25 season) 04/16/2023   Medicare Annual Wellness (AWV)  12/14/2023   MAMMOGRAM  06/15/2024   DTaP/Tdap/Td (3 - Tdap) 07/04/2026   Pneumonia Vaccine 82+ Years old  Completed   DEXA SCAN  Completed   Hepatitis C Screening  Completed   HPV VACCINES  Aged Out   Fecal DNA (Cologuard)  Discontinued    Patient Care Team: Debera Lat, Cordelia Poche as PCP - General (Physician Assistant) Cleophas Dunker,  Claude Manges, MD (Inactive) as Consulting Physician (Orthopedic Surgery) Carman Ching, MD (Inactive) as Consulting Physician (Gastroenterology) Lynden Ang, NP as Nurse Practitioner (Obstetrics and Gynecology) Charlett Nose, [facility] (Inactive) as Pharmacist (Pharmacist)  Review of Systems Except see HPI   {Insert previous labs (optional):23779} {See past labs  Heme  Chem  Endocrine  Serology  Results Review (optional):1}   Objective    BP 118/75   Pulse 71   Ht 5' 4.17" (1.63 m)   Wt 157 lb (71.2 kg)   SpO2 100%   BMI 26.80 kg/m  {Insert last BP/Wt (optional):23777}{See vitals history (optional):1}   Physical Exam  Depression Screen    12/14/2022    3:05 PM 06/08/2022    1:23 AM 11/15/2021    3:02 PM 06/08/2021   12:34 AM  PHQ 2/9 Scores  PHQ - 2 Score 2 0 5 0  PHQ- 9 Score 5  16    No results found for any visits on 11/07/23.  Assessment & Plan     *** Assessment and Plan Assessment & Plan      Encounter to establish care Welcomed to our clinic Reviewed past medical hx, social hx, family hx and surgical hx Pt advised to send all vaccination records or screening   No follow-ups on file.    The patient was advised to call back or seek an in-person evaluation if the symptoms worsen or if the condition fails to improve as anticipated.  I discussed the assessment and treatment plan with the patient. The patient was provided an opportunity to ask questions and all were answered. The patient agreed with the  plan and demonstrated an understanding of the instructions.  I, Debera Lat, PA-C have reviewed all documentation for this visit. The documentation on  11/07/2023   for the exam, diagnosis, procedures, and orders are all accurate and complete.  Debera Lat, [facility], MMS [facility] [phone] (phone) [phone] (fax)  [facility]

## 2023-11-08 ENCOUNTER — Encounter: Payer: HMO | Admitting: Physical Therapy

## 2023-11-08 ENCOUNTER — Encounter: Payer: Self-pay | Admitting: Physician Assistant

## 2023-11-08 DIAGNOSIS — R413 Other amnesia: Secondary | ICD-10-CM | POA: Insufficient documentation

## 2023-11-08 DIAGNOSIS — R5383 Other fatigue: Secondary | ICD-10-CM | POA: Insufficient documentation

## 2023-11-08 LAB — COMPREHENSIVE METABOLIC PANEL
ALT: 22 IU/L (ref 0–32)
AST: 25 IU/L (ref 0–40)
Albumin: 4.9 g/dL — ABNORMAL HIGH (ref 3.8–4.8)
Alkaline Phosphatase: 87 IU/L (ref 44–121)
BUN/Creatinine Ratio: 20 (ref 12–28)
BUN: 23 mg/dL (ref 8–27)
Bilirubin Total: 0.4 mg/dL (ref 0.0–1.2)
CO2: 23 mmol/L (ref 20–29)
Calcium: 10.6 mg/dL — ABNORMAL HIGH (ref 8.7–10.3)
Chloride: 104 mmol/L (ref 96–106)
Creatinine, Ser: 1.15 mg/dL — ABNORMAL HIGH (ref 0.57–1.00)
Globulin, Total: 2.2 g/dL (ref 1.5–4.5)
Glucose: 74 mg/dL (ref 70–99)
Potassium: 4.8 mmol/L (ref 3.5–5.2)
Sodium: 144 mmol/L (ref 134–144)
Total Protein: 7.1 g/dL (ref 6.0–8.5)
eGFR: 49 mL/min/{1.73_m2} — ABNORMAL LOW (ref >59)

## 2023-11-08 LAB — LIPID PANEL
Chol/HDL Ratio: 3.1 ratio (ref 0.0–4.4)
Cholesterol, Total: 160 mg/dL (ref 100–199)
HDL: 51 mg/dL (ref >39)
LDL Chol Calc (NIH): 89 mg/dL (ref 0–99)
Triglycerides: 109 mg/dL (ref 0–149)
VLDL Cholesterol Cal: 20 mg/dL (ref 5–40)

## 2023-11-08 LAB — CBC WITH DIFFERENTIAL/PLATELET
Basophils Absolute: 0.1 10*3/uL (ref 0.0–0.2)
Basos: 1 %
EOS (ABSOLUTE): 0.1 10*3/uL (ref 0.0–0.4)
Eos: 1 %
Hematocrit: 46.9 % — ABNORMAL HIGH (ref 34.0–46.6)
Hemoglobin: 15.8 g/dL (ref 11.1–15.9)
Immature Grans (Abs): 0.1 10*3/uL (ref 0.0–0.1)
Immature Granulocytes: 1 %
Lymphocytes Absolute: 2.8 10*3/uL (ref 0.7–3.1)
Lymphs: 26 %
MCH: 30.9 pg (ref 26.6–33.0)
MCHC: 33.7 g/dL (ref 31.5–35.7)
MCV: 92 fL (ref 79–97)
Monocytes Absolute: 0.8 10*3/uL (ref 0.1–0.9)
Monocytes: 7 %
Neutrophils Absolute: 7.1 10*3/uL — ABNORMAL HIGH (ref 1.4–7.0)
Neutrophils: 64 %
Platelets: 343 10*3/uL (ref 150–450)
RBC: 5.12 x10E6/uL (ref 3.77–5.28)
RDW: 12.8 % (ref 11.7–15.4)
WBC: 10.8 10*3/uL (ref 3.4–10.8)

## 2023-11-08 LAB — TSH: TSH: 2.28 u[IU]/mL (ref 0.450–4.500)

## 2023-11-08 LAB — HEMOGLOBIN A1C
Est. average glucose Bld gHb Est-mCnc: 117 mg/dL
Hgb A1c MFr Bld: 5.7 % — ABNORMAL HIGH (ref 4.8–5.6)

## 2023-11-10 ENCOUNTER — Ambulatory Visit: Payer: PPO | Admitting: Physician Assistant

## 2023-11-15 ENCOUNTER — Encounter: Payer: HMO | Admitting: Physical Therapy

## 2023-11-17 ENCOUNTER — Ambulatory Visit (INDEPENDENT_AMBULATORY_CARE_PROVIDER_SITE_OTHER): Admitting: Physician Assistant

## 2023-11-17 ENCOUNTER — Ambulatory Visit: Payer: Self-pay | Admitting: Physician Assistant

## 2023-11-17 ENCOUNTER — Encounter: Payer: Self-pay | Admitting: Physician Assistant

## 2023-11-17 ENCOUNTER — Ambulatory Visit
Admission: RE | Admit: 2023-11-17 | Discharge: 2023-11-17 | Disposition: A | Discharge status: Home / Self Care | Attending: Physician Assistant | Admitting: Physician Assistant

## 2023-11-17 ENCOUNTER — Ambulatory Visit
Admission: RE | Admit: 2023-11-17 | Discharge: 2023-11-17 | Disposition: A | Discharge status: Home / Self Care | Source: Ambulatory Visit | Attending: Physician Assistant | Admitting: Physician Assistant

## 2023-11-17 VITALS — BP 111/45 | HR 63 | Temp 99.0°F | Resp 16 | Ht 64.0 in | Wt 158.0 lb

## 2023-11-17 DIAGNOSIS — R0989 Other specified symptoms and signs involving the circulatory and respiratory systems: Secondary | ICD-10-CM

## 2023-11-17 DIAGNOSIS — R0602 Shortness of breath: Secondary | ICD-10-CM | POA: Insufficient documentation

## 2023-11-17 DIAGNOSIS — R062 Wheezing: Secondary | ICD-10-CM | POA: Diagnosis not present

## 2023-11-17 MED ORDER — CEFDINIR 300 MG PO CAPS: 300.0000 mg | ORAL_CAPSULE | Freq: Two times a day (BID) | ORAL | 0 refills | Status: DC

## 2023-11-17 NOTE — Progress Notes (Unsigned)
 Established patient visit  Patient: Rebekah Hamilton   DOB: 10/10/1945   78 y.o. Female  MRN: [id number] Visit Date: 11/17/2023  Today's healthcare provider: Debera Lat, PA-C   Chief Complaint  Patient presents with  . Nasal Congestion    Congestion, cough and sweats 2x weeks.Marland KitchenPossible flu? No fever, nausea   Subjective      Discussed the use of AI scribe software for clinical note transcription with the patient, who gave verbal consent to proceed.  History of Present Illness The patient presents with a one week history of upper respiratory symptoms, initially thought to be a cold. She reports a persistent cough, particularly when lying down, and describes feeling 'wobbly.' She denies fever and muscle pain, but notes night sweats. She also reports a significant amount of nasal congestion and rhinorrhea, with postnasal drainage. She denies any similar past episodes. She mentions a close friend who has been ill with bronchitis for a couple of weeks.       11/07/2023   11:19 AM 12/14/2022    3:05 PM 06/08/2022    1:23 AM  Depression screen PHQ 2/9  Decreased Interest 2 1 0  Down, Depressed, Hopeless 2 1 0  PHQ - 2 Score 4 2 0  Altered sleeping 2 0   Tired, decreased energy 2 1   Change in appetite 2 1   Feeling bad or failure about yourself  2 1   Trouble concentrating 2 0   Moving slowly or fidgety/restless 0 0   Suicidal thoughts 1 0   PHQ-9 Score 15 5   Difficult doing work/chores Somewhat difficult Somewhat difficult       11/07/2023   11:20 AM  GAD 7 : Generalized Anxiety Score  Nervous, Anxious, on Edge 1  Control/stop worrying 1  Worry too much - different things 1  Trouble relaxing 1  Restless 0  Easily annoyed or irritable 2  Afraid - awful might happen 1  Total GAD 7 Score 7  Anxiety Difficulty Somewhat difficult    Medications: Outpatient Medications Prior to Visit  Medication Sig  . Ascorbic Acid (VITAMIN C PO) 500 mg 2 (two) times daily.  Marland Kitchen  aspirin-acetaminophen-caffeine (EXCEDRIN EXTRA STRENGTH) 250-250-65 MG tablet Take 1 tablet by mouth once as needed.  Marland Kitchen buPROPion (WELLBUTRIN XL) 150 MG 24 hr tablet Take 1 tablet every Morning for Mood, Focus & Concentration  . Cholecalciferol (VITAMIN D PO) Take by mouth. Takes 40981 to 12000 units daily.  . DULoxetine (CYMBALTA) 30 MG capsule TAKE 3 CAPSULES BY MOUTH DAILY FOR MOOD AND CHRONIC PAIN  . fexofenadine (ALLEGRA) 180 MG tablet Take 180 mg by mouth daily.  . hydrochlorothiazide (HYDRODIURIL) 25 MG tablet Take 1/2 -1 tablet daily for BP and fluid for goal <130/80.  Marland Kitchen pravastatin (PRAVACHOL) 40 MG tablet Take one tablet at bedtime  . progesterone (PROMETRIUM) 100 MG capsule Take  1 capsule  Daily  . Ubrogepant (UBRELVY) 50 MG TABS Take 1 tab at onset of migraine.  May repeat in 2 hrs, if needed.  Max dose: 2 tabs/day. This is a 30 day prescription.   No facility-administered medications prior to visit.    Review of Systems All negative Except see HPI   {Insert previous labs (optional):23779} {See past labs  Heme  Chem  Endocrine  Serology  Results Review (optional):1}   Objective    BP (!) 111/45 (BP Location: Left Arm, Patient Position: Sitting)   Pulse 63   Temp 99 F (37.2 C) (  Oral)   Resp 16   Ht 5\' 4"  (1.626 m)   Wt 158 lb (71.7 kg)   SpO2 96%   BMI 27.12 kg/m  {Insert last BP/Wt (optional):23777}{See vitals history (optional):1}   Physical Exam   No results found for any visits on 11/17/23.      Assessment & Plan Possible Pneumonia Abnormal crackles suggest possible pneumonia. Differential includes pneumonia, pending chest x-ray. Intolerant to penicillin and doxycycline. Cephalosporins considered. - Order chest x-ray. - Prescribe cephalosporin, monitor for adverse reactions. - Advise increased fluid intake. - Recommend antihistamines for congestion. - Suggest acetaminophen for headache. - Instruct on nasal saline rinse and fluticasone. - Instruct  to report adverse reactions immediately.  Follow-up Follow-up in four weeks to reassess symptoms and review chest x-ray results. - Schedule follow-up in four weeks.  No orders of the defined types were placed in this encounter.   No follow-ups on file.   The patient was advised to call back or seek an in-person evaluation if the symptoms worsen or if the condition fails to improve as anticipated.  I discussed the assessment and treatment plan with the patient. The patient was provided an opportunity to ask questions and all were answered. The patient agreed with the plan and demonstrated an understanding of the instructions.  I, Debera Lat, PA-C have reviewed all documentation for this visit. The documentation on 11/17/2023  for the exam, diagnosis, procedures, and orders are all accurate and complete.  Debera Lat, [facility], MMS [facility] [phone] (phone) [phone] (fax)  [facility]

## 2023-11-17 NOTE — Progress Notes (Incomplete)
 Established patient visit  Patient: Rebekah Hamilton   DOB: 12-07-1945   78 y.o. Female  MRN: [id number] Visit Date: 11/17/2023  Today's healthcare provider: Debera Lat, PA-C   Chief Complaint  Patient presents with  . Nasal Congestion    Congestion, cough and sweats 2x weeks.Marland KitchenPossible flu? No fever, nausea   Subjective      Discussed the use of AI scribe software for clinical note transcription with the patient, who gave verbal consent to proceed.  History of Present Illness The patient presents with a one week history of upper respiratory symptoms, initially thought to be a cold. She reports a persistent cough, particularly when lying down, and describes feeling 'wobbly.' She denies fever and muscle pain, but notes night sweats. She also reports a significant amount of nasal congestion and rhinorrhea, with postnasal drainage. She denies any similar past episodes. She mentions a close friend who has been ill with bronchitis for a couple of weeks.       11/07/2023   11:19 AM 12/14/2022    3:05 PM 06/08/2022    1:23 AM  Depression screen PHQ 2/9  Decreased Interest 2 1 0  Down, Depressed, Hopeless 2 1 0  PHQ - 2 Score 4 2 0  Altered sleeping 2 0   Tired, decreased energy 2 1   Change in appetite 2 1   Feeling bad or failure about yourself  2 1   Trouble concentrating 2 0   Moving slowly or fidgety/restless 0 0   Suicidal thoughts 1 0   PHQ-9 Score 15 5   Difficult doing work/chores Somewhat difficult Somewhat difficult       11/07/2023   11:20 AM  GAD 7 : Generalized Anxiety Score  Nervous, Anxious, on Edge 1  Control/stop worrying 1  Worry too much - different things 1  Trouble relaxing 1  Restless 0  Easily annoyed or irritable 2  Afraid - awful might happen 1  Total GAD 7 Score 7  Anxiety Difficulty Somewhat difficult    Medications: Outpatient Medications Prior to Visit  Medication Sig  . Ascorbic Acid (VITAMIN C PO) 500 mg 2 (two) times daily.  Marland Kitchen  aspirin-acetaminophen-caffeine (EXCEDRIN EXTRA STRENGTH) 250-250-65 MG tablet Take 1 tablet by mouth once as needed.  Marland Kitchen buPROPion (WELLBUTRIN XL) 150 MG 24 hr tablet Take 1 tablet every Morning for Mood, Focus & Concentration  . Cholecalciferol (VITAMIN D PO) Take by mouth. Takes 40981 to 12000 units daily.  . DULoxetine (CYMBALTA) 30 MG capsule TAKE 3 CAPSULES BY MOUTH DAILY FOR MOOD AND CHRONIC PAIN  . fexofenadine (ALLEGRA) 180 MG tablet Take 180 mg by mouth daily.  . hydrochlorothiazide (HYDRODIURIL) 25 MG tablet Take 1/2 -1 tablet daily for BP and fluid for goal <130/80.  Marland Kitchen pravastatin (PRAVACHOL) 40 MG tablet Take one tablet at bedtime  . progesterone (PROMETRIUM) 100 MG capsule Take  1 capsule  Daily  . Ubrogepant (UBRELVY) 50 MG TABS Take 1 tab at onset of migraine.  May repeat in 2 hrs, if needed.  Max dose: 2 tabs/day. This is a 30 day prescription.   No facility-administered medications prior to visit.    Review of Systems All negative Except see HPI   {Insert previous labs (optional):23779} {See past labs  Heme  Chem  Endocrine  Serology  Results Review (optional):1}   Objective    BP (!) 111/45 (BP Location: Left Arm, Patient Position: Sitting)   Pulse 63   Temp 99 F (37.2 C) (  Oral)   Resp 16   Ht 5\' 4"  (1.626 m)   Wt 158 lb (71.7 kg)   SpO2 96%   BMI 27.12 kg/m  {Insert last BP/Wt (optional):23777}{See vitals history (optional):1}   Physical Exam   No results found for any visits on 11/17/23.      Assessment & Plan Possible Pneumonia Abnormal crackles suggest possible pneumonia. Differential includes pneumonia, pending chest x-ray. Intolerant to penicillin and doxycycline. Cephalosporins considered. - Order chest x-ray. - Prescribe cephalosporin, monitor for adverse reactions. - Advise increased fluid intake. - Recommend antihistamines for congestion. - Suggest acetaminophen for headache. - Instruct on nasal saline rinse and fluticasone. - Instruct  to report adverse reactions immediately.  Follow-up Follow-up in four weeks to reassess symptoms and review chest x-ray results. - Schedule follow-up in four weeks.  No orders of the defined types were placed in this encounter.   No follow-ups on file.   The patient was advised to call back or seek an in-person evaluation if the symptoms worsen or if the condition fails to improve as anticipated.  I discussed the assessment and treatment plan with the patient. The patient was provided an opportunity to ask questions and all were answered. The patient agreed with the plan and demonstrated an understanding of the instructions.  I, Debera Lat, PA-C have reviewed all documentation for this visit. The documentation on 11/17/2023  for the exam, diagnosis, procedures, and orders are all accurate and complete.  Debera Lat, [facility], MMS [facility] [phone] (phone) [phone] (fax)  [facility]

## 2023-11-17 NOTE — Telephone Encounter (Signed)
 Constant headache (8-9/10) for several days  Symptoms: Runny nose  Pertinent Negatives: Patient denies fever, stiff neck  Disposition: [x] ED [x] Refused Recommended   Additional Notes: Pt was seen in office today by Debera Lat, PA. Pt states she started the antibiotic. Pt thinks the headache is from the congestion. This RN recommends pt goes to the ED due to having an 8-9/10 constant headache. Pt is requesting Debera Lat, PA, prescribes her something for the pain. This RN notified [facility] of pt refusal to go to ED at this time.   Copied from CRM [id number]. Topic: Clinical - Red Word Triage >> Nov 17, 2023  3:36 PM Elle L wrote: Red Word that prompted transfer to Nurse Triage: The patient states that she has a painful headache. The patient states that it is not a migrane it is pain from her having a cold. She had an appointment this morning and did x-rays but the pain is worsening. Reason for Disposition  [1] SEVERE headache (e.g., excruciating) AND [2] "worst headache" of life  Answer Assessment - Initial Assessment Questions LOCATION: "Where does it hurt?"      Head ONSET: "When did the headache start?" (Minutes, hours or days)      Several days PATTERN: "Does the pain come and go, or has it been constant since it started?"     Constant SEVERITY: "How bad is the pain?" and "What does it keep you from doing?"  (e.g., Scale 1-10; mild, moderate, or severe)     8-9/10 constant CAUSE: "What do you think is causing the headache?"     Congestion  Protocols used: Headache-A-AH

## 2023-11-21 ENCOUNTER — Telehealth: Payer: Self-pay

## 2023-11-21 ENCOUNTER — Ambulatory Visit: Admitting: Physician Assistant

## 2023-11-21 DIAGNOSIS — R062 Wheezing: Secondary | ICD-10-CM

## 2023-11-21 DIAGNOSIS — R0989 Other specified symptoms and signs involving the circulatory and respiratory systems: Secondary | ICD-10-CM

## 2023-11-21 DIAGNOSIS — R0602 Shortness of breath: Secondary | ICD-10-CM

## 2023-11-21 MED ORDER — ALBUTEROL SULFATE HFA 108 (90 BASE) MCG/ACT IN AERS: 2.0000 | INHALATION_SPRAY | Freq: Four times a day (QID) | RESPIRATORY_TRACT | 2 refills | Status: DC | PRN

## 2023-11-21 MED ORDER — PREDNISONE 20 MG PO TABS: 20.0000 mg | ORAL_TABLET | Freq: Two times a day (BID) | ORAL | 0 refills | Status: DC

## 2023-11-21 NOTE — Telephone Encounter (Signed)
 Copied from CRM [id number]. Topic: Clinical - Medical Advice >> Nov 21, 2023  9:22 AM Antwanette L wrote: Reason for CRM: Patient wants to let Debera Lat PA-C know that she still has congestion and a cough. The patient is currently taking cefdinir (OMNICEF) 300 MG capsule, but she does not think the medicine is strong enough. Please contact patient at [phone]

## 2023-11-22 ENCOUNTER — Encounter: Payer: HMO | Admitting: Physical Therapy

## 2023-11-28 ENCOUNTER — Encounter: Payer: Self-pay | Admitting: Neurology

## 2023-11-28 ENCOUNTER — Ambulatory Visit: Payer: HMO | Admitting: Neurology

## 2023-11-28 VITALS — BP 155/85 | HR 72 | Ht 64.0 in | Wt 154.5 lb

## 2023-11-28 DIAGNOSIS — M545 Low back pain, unspecified: Secondary | ICD-10-CM

## 2023-11-28 DIAGNOSIS — G8929 Other chronic pain: Secondary | ICD-10-CM | POA: Diagnosis not present

## 2023-11-28 DIAGNOSIS — G3184 Mild cognitive impairment, so stated: Secondary | ICD-10-CM | POA: Diagnosis not present

## 2023-11-28 DIAGNOSIS — I679 Cerebrovascular disease, unspecified: Secondary | ICD-10-CM

## 2023-11-28 MED ORDER — BUTALBITAL-APAP-CAFFEINE 50-325-40 MG PO TABS: 1.0000 | ORAL_TABLET | Freq: Four times a day (QID) | ORAL | 5 refills | Status: AC | PRN
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
## 2023-11-29 ENCOUNTER — Encounter: Payer: HMO | Admitting: Physical Therapy

## 2023-12-22 ENCOUNTER — Encounter: Payer: Self-pay | Admitting: Physician Assistant

## 2023-12-22 ENCOUNTER — Ambulatory Visit: Admitting: Physician Assistant

## 2023-12-22 VITALS — BP 129/86 | HR 66 | Ht 64.0 in | Wt 158.3 lb

## 2023-12-22 DIAGNOSIS — M21611 Bunion of right foot: Secondary | ICD-10-CM

## 2023-12-22 DIAGNOSIS — I10 Essential (primary) hypertension: Secondary | ICD-10-CM

## 2023-12-22 DIAGNOSIS — F419 Anxiety disorder, unspecified: Secondary | ICD-10-CM | POA: Diagnosis not present

## 2023-12-22 DIAGNOSIS — N3281 Overactive bladder: Secondary | ICD-10-CM

## 2023-12-22 DIAGNOSIS — F339 Major depressive disorder, recurrent, unspecified: Secondary | ICD-10-CM

## 2023-12-22 DIAGNOSIS — N182 Chronic kidney disease, stage 2 (mild): Secondary | ICD-10-CM

## 2023-12-22 DIAGNOSIS — E559 Vitamin D deficiency, unspecified: Secondary | ICD-10-CM | POA: Diagnosis not present

## 2023-12-22 DIAGNOSIS — M21612 Bunion of left foot: Secondary | ICD-10-CM | POA: Diagnosis not present

## 2023-12-22 DIAGNOSIS — F331 Major depressive disorder, recurrent, moderate: Secondary | ICD-10-CM

## 2023-12-22 DIAGNOSIS — N1831 Chronic kidney disease, stage 3a: Secondary | ICD-10-CM | POA: Diagnosis not present

## 2023-12-22 DIAGNOSIS — E782 Mixed hyperlipidemia: Secondary | ICD-10-CM | POA: Diagnosis not present

## 2023-12-22 DIAGNOSIS — G3184 Mild cognitive impairment, so stated: Secondary | ICD-10-CM | POA: Diagnosis not present

## 2023-12-22 DIAGNOSIS — H9193 Unspecified hearing loss, bilateral: Secondary | ICD-10-CM

## 2023-12-22 DIAGNOSIS — H409 Unspecified glaucoma: Secondary | ICD-10-CM

## 2023-12-22 MED ORDER — DULOXETINE HCL 30 MG PO CPEP
ORAL_CAPSULE | ORAL | 3 refills | Status: AC
Start: 2023-12-22 — End: ?

## 2023-12-22 MED ORDER — BUPROPION HCL ER (XL) 300 MG PO TB24: 300.0000 mg | ORAL_TABLET | Freq: Every day | ORAL | 1 refills | Status: DC

## 2023-12-22 NOTE — Progress Notes (Unsigned)
 Established patient visit  Patient: Rebekah Hamilton   DOB: 1946/05/19   78 y.o. Female  MRN: [id number] Visit Date: 12/22/2023  Today's healthcare provider: Blane Bunting, PA-C   Chief Complaint  Patient presents with  . Medical Management of Chronic Issues    Patient is present for 4 week follow up. She was last seen on 11/17/23 for SOB and wheezing associated with abnormal lung sounds. Pt reviewed her results via mychart on 11/28/2023 at 7:40 AM. She reports she is feeling better and no questions in regards to imaging.   Aaron Aas Hypertension    She reports excellent compliance with treatment. She is not having side effects.  She is following a Low Sodium diet. She is exercising. She does not smoke. Use of agents associated with hypertension: NSAIDS.  She reports no symptoms  . Hyperlipidemia    She reports excellent compliance with treatment. She is not having side effects.  Current diet: low salt Current exercise: walking  . Depression    She reports excellent compliance with treatment. She is not having side effects.  She reports excellent tolerance of treatment. Current symptoms include: depressed mood, fatigue, impaired memory, and lack of appetite She feels she is Worse since last visit. Not sure if medications are working    Subjective     HPI     Medical Management of Chronic Issues    Additional comments: Patient is present for 4 week follow up. She was last seen on 11/17/23 for SOB and wheezing associated with abnormal lung sounds. Pt reviewed her results via mychart on 11/28/2023 at 7:40 AM. She reports she is feeling better and no questions in regards to imaging.         Hypertension    Additional comments: She reports excellent compliance with treatment. She is not having side effects.  She is following a Low Sodium diet. She is exercising. She does not smoke. Use of agents associated with hypertension: NSAIDS.  She reports no symptoms         Hyperlipidemia    Additional comments: She reports excellent compliance with treatment. She is not having side effects.  Current diet: low salt Current exercise: walking        Depression    Additional comments: She reports excellent compliance with treatment. She is not having side effects.  She reports excellent tolerance of treatment. Current symptoms include: depressed mood, fatigue, impaired memory, and lack of appetite She feels she is Worse since last visit. Not sure if medications are working       Last edited by Pasty Bongo, CMA on 12/22/2023  8:35 AM.       Discussed the use of AI scribe software for clinical note transcription with the patient, who gave verbal consent to proceed.  History of Present Illness         11/07/2023   11:19 AM 12/14/2022    3:05 PM 06/08/2022    1:23 AM  Depression screen PHQ 2/9  Decreased Interest 2 1 0  Down, Depressed, Hopeless 2 1 0  PHQ - 2 Score 4 2 0  Altered sleeping 2 0   Tired, decreased energy 2 1   Change in appetite 2 1   Feeling bad or failure about yourself  2 1   Trouble concentrating 2 0   Moving slowly or fidgety/restless 0 0   Suicidal thoughts 1 0   PHQ-9 Score 15 5   Difficult doing work/chores Somewhat difficult Somewhat difficult  11/07/2023   11:20 AM  GAD 7 : Generalized Anxiety Score  Nervous, Anxious, on Edge 1  Control/stop worrying 1  Worry too much - different things 1  Trouble relaxing 1  Restless 0  Easily annoyed or irritable 2  Afraid - awful might happen 1  Total GAD 7 Score 7  Anxiety Difficulty Somewhat difficult    Medications: Outpatient Medications Prior to Visit  Medication Sig  . Ascorbic Acid (VITAMIN C PO) 500 mg 2 (two) times daily.  Aaron Aas aspirin 81 MG chewable tablet Chew by mouth daily.  . buPROPion  (WELLBUTRIN  XL) 150 MG 24 hr tablet Take 1 tablet every Morning for Mood, Focus & Concentration  . butalbital -acetaminophen -caffeine  (FIORICET) 50-325-40 MG tablet  Take 1 tablet by mouth every 6 (six) hours as needed for headache.  . Cholecalciferol (VITAMIN D  PO) Take by mouth. Takes 10000 to 12000 units daily.  . DULoxetine  (CYMBALTA ) 30 MG capsule TAKE 3 CAPSULES BY MOUTH DAILY FOR MOOD AND CHRONIC PAIN  . fexofenadine (ALLEGRA) 180 MG tablet Take 180 mg by mouth daily.  . hydrochlorothiazide  (HYDRODIURIL ) 25 MG tablet Take 1/2 -1 tablet daily for BP and fluid for goal <130/80.  . pravastatin  (PRAVACHOL ) 40 MG tablet Take one tablet at bedtime  . cefdinir  (OMNICEF ) 300 MG capsule Take 1 capsule (300 mg total) by mouth 2 (two) times daily.  . progesterone  (PROMETRIUM ) 100 MG capsule Take  1 capsule  Daily   No facility-administered medications prior to visit.    Review of Systems All negative Except see HPI   {Insert previous labs (optional):23779} {See past labs  Heme  Chem  Endocrine  Serology  Results Review (optional):1}   Objective    BP 129/86 (BP Location: Left Arm, Patient Position: Sitting, Cuff Size: Large)   Pulse 66   Ht 5\' 4"  (1.626 m)   Wt 158 lb 4.8 oz (71.8 kg)   SpO2 93%   BMI 27.17 kg/m  {Insert last BP/Wt (optional):23777}{See vitals history (optional):1}   Physical Exam   No results found for any visits on 12/22/23.      Assessment and Plan Assessment & Plan     No orders of the defined types were placed in this encounter.   No follow-ups on file.   The patient was advised to call back or seek an in-person evaluation if the symptoms worsen or if the condition fails to improve as anticipated.  I discussed the assessment and treatment plan with the patient. The patient was provided an opportunity to ask questions and all were answered. The patient agreed with the plan and demonstrated an understanding of the instructions.  I, Mystique Bjelland, PA-C have reviewed all documentation for this visit. The documentation on 12/22/2023  for the exam, diagnosis, procedures, and orders are all accurate and  complete.  Blane Bunting, [facility], MMS [facility] [phone] (phone) [phone] (fax)  [facility]

## 2023-12-24 DIAGNOSIS — M21611 Bunion of right foot: Secondary | ICD-10-CM | POA: Insufficient documentation

## 2023-12-24 DIAGNOSIS — H9193 Unspecified hearing loss, bilateral: Secondary | ICD-10-CM | POA: Insufficient documentation

## 2024-01-10 ENCOUNTER — Ambulatory Visit: Admitting: Podiatry

## 2024-01-10 ENCOUNTER — Encounter: Payer: Self-pay | Admitting: Podiatry

## 2024-01-10 ENCOUNTER — Ambulatory Visit

## 2024-01-10 DIAGNOSIS — D2371 Other benign neoplasm of skin of right lower limb, including hip: Secondary | ICD-10-CM

## 2024-01-10 DIAGNOSIS — H903 Sensorineural hearing loss, bilateral: Secondary | ICD-10-CM | POA: Diagnosis not present

## 2024-01-10 DIAGNOSIS — M2041 Other hammer toe(s) (acquired), right foot: Secondary | ICD-10-CM | POA: Diagnosis not present

## 2024-01-10 DIAGNOSIS — H6123 Impacted cerumen, bilateral: Secondary | ICD-10-CM | POA: Diagnosis not present

## 2024-01-10 DIAGNOSIS — H6983 Other specified disorders of Eustachian tube, bilateral: Secondary | ICD-10-CM | POA: Diagnosis not present

## 2024-01-10 DIAGNOSIS — M2010 Hallux valgus (acquired), unspecified foot: Secondary | ICD-10-CM | POA: Diagnosis not present

## 2024-01-10 DIAGNOSIS — Z7409 Other reduced mobility: Secondary | ICD-10-CM | POA: Insufficient documentation

## 2024-01-10 NOTE — Progress Notes (Signed)
 Subjective:  Patient ID: Rebekah Hamilton, female    DOB: [birth year],  MRN: [id number] HPI Chief Complaint  Patient presents with   Foot Pain    1st MPJ bilateral - bunion deformity   Toe Pain    2nd toe right - HT overlapping 3rd toe, "is there something that can keep that toe pulled down off the other one?"   Callouses    Plantar forefoot right - small, callused lesion, tender walking   New Patient (Initial Visit)    Est pt 2017    78 y.o. female presents with the above complaint.   ROS: Denies fever chills nausea muscle aches pains calf pain back pain chest pain shortness of breath.  Past Medical History:  Diagnosis Date   Allergy    Anemia    Anxiety    Depression    Hyperlipidemia    Hypertension    Lumbar stenosis    Migraines    Prediabetes    Past Surgical History:  Procedure Laterality Date   BREAST LUMPECTOMY Bilateral    CHOLECYSTECTOMY  1999   SPINE SURGERY      Current Outpatient Medications:    Ascorbic Acid (VITAMIN C PO), 500 mg 2 (two) times daily., Disp: , Rfl:    aspirin 81 MG chewable tablet, Chew by mouth daily., Disp: , Rfl:    buPROPion  (WELLBUTRIN  XL) 150 MG 24 hr tablet, Take 1 tablet every Morning for Mood, Focus & Concentration, Disp: 90 tablet, Rfl: 3   buPROPion  (WELLBUTRIN  XL) 300 MG 24 hr tablet, Take 1 tablet (300 mg total) by mouth daily., Disp: 60 tablet, Rfl: 1   butalbital -acetaminophen -caffeine  (FIORICET) 50-325-40 MG tablet, Take 1 tablet by mouth every 6 (six) hours as needed for headache., Disp: 10 tablet, Rfl: 5   Cholecalciferol (VITAMIN D  PO), Take by mouth. Takes 10000 to 12000 units daily., Disp: , Rfl:    DULoxetine  (CYMBALTA ) 30 MG capsule, TAKE 3 CAPSULES BY MOUTH DAILY FOR MOOD AND CHRONIC PAIN, Disp: 270 capsule, Rfl: 3   fexofenadine (ALLEGRA) 180 MG tablet, Take 180 mg by mouth daily., Disp: , Rfl:    hydrochlorothiazide  (HYDRODIURIL ) 25 MG tablet, Take 1/2 -1 tablet daily for BP and fluid for goal <130/80.,  Disp: 90 tablet, Rfl: 1   pravastatin  (PRAVACHOL ) 40 MG tablet, Take one tablet at bedtime, Disp: 90 tablet, Rfl: 3  Allergies  Allergen Reactions   Augmentin [Amoxicillin-Pot Clavulanate] Diarrhea   Doxycycline  Nausea Only   Egg White (Diagnostic) Nausea Only   Prozac [Fluoxetine Hcl] Other (See Comments)    Disoriented   Review of Systems Objective:  There were no vitals filed for this visit.  General: Well developed, nourished, in no acute distress, alert and oriented x3   Dermatological: Skin is warm, dry and supple bilateral. Nails x 10 are well maintained; remaining integument appears unremarkable at this time. There are no open sores, no preulcerative lesions, no rash or signs of infection present.  Reactive hyperkeratotic lesion subsecond metatarsal head of the right foot.  No foreign object seen.  Vascular: Dorsalis Pedis artery and Posterior Tibial artery pedal pulses are 2/4 bilateral with immedate capillary fill time. Pedal hair growth present. No varicosities and no lower extremity edema present bilateral.   Neruologic: Grossly intact via light touch bilateral. Vibratory intact via tuning fork bilateral. Protective threshold with Semmes Wienstein monofilament intact to all pedal sites bilateral. Patellar and Achilles deep tendon reflexes 2+ bilateral. No Babinski or clonus noted bilateral.   Musculoskeletal:  No gross boney pedal deformities bilateral. No pain, crepitus, or limitation noted with foot and ankle range of motion bilateral. Muscular strength 5/5 in all groups tested bilateral.  Severe hallux valgus deformity with overlapping hammertoe deformities.  Pain on palpation second metatarsal phalangeal joint.  Hallux valgus is minimally reducible.  We are able to reduce to some degree the second toe.  Gait: Unassisted, Nonantalgic.    Radiographs:  None taken  Assessment & Plan:   Assessment: Hallux valgus with overlapping toes right over left.  Plan: Discussed  etiology pathology conservative surgical therapies at this point she does not want surgery so we used toe corrector and she is okay with that.  Since it does help pad the second metatarsal phalangeal joint as well I think should be happy with this.     Rebekah Hamilton, [place]

## 2024-01-29 ENCOUNTER — Ambulatory Visit: Admitting: Physician Assistant

## 2024-01-29 ENCOUNTER — Encounter: Payer: Self-pay | Admitting: Physician Assistant

## 2024-01-29 VITALS — BP 144/71 | HR 69 | Resp 16 | Ht 64.0 in | Wt 156.0 lb

## 2024-01-29 DIAGNOSIS — G3184 Mild cognitive impairment, so stated: Secondary | ICD-10-CM | POA: Diagnosis not present

## 2024-01-29 DIAGNOSIS — E559 Vitamin D deficiency, unspecified: Secondary | ICD-10-CM

## 2024-01-29 DIAGNOSIS — N1831 Chronic kidney disease, stage 3a: Secondary | ICD-10-CM

## 2024-01-29 DIAGNOSIS — F419 Anxiety disorder, unspecified: Secondary | ICD-10-CM | POA: Diagnosis not present

## 2024-01-29 DIAGNOSIS — M79673 Pain in unspecified foot: Secondary | ICD-10-CM | POA: Diagnosis not present

## 2024-01-29 DIAGNOSIS — H918X3 Other specified hearing loss, bilateral: Secondary | ICD-10-CM | POA: Diagnosis not present

## 2024-01-29 DIAGNOSIS — F339 Major depressive disorder, recurrent, unspecified: Secondary | ICD-10-CM | POA: Diagnosis not present

## 2024-01-29 DIAGNOSIS — E782 Mixed hyperlipidemia: Secondary | ICD-10-CM

## 2024-01-29 DIAGNOSIS — I10 Essential (primary) hypertension: Secondary | ICD-10-CM | POA: Diagnosis not present

## 2024-01-29 NOTE — Progress Notes (Signed)
 Established patient visit  Patient: Rebekah Hamilton   DOB: [birth year]   78 y.o. Female  MRN: [id number] Visit Date: 01/29/2024  Today's healthcare provider: Blane Bunting, PA-C   Chief Complaint  Patient presents with   Follow-up    1 month f/u . No other concens   Subjective     HPI     Follow-up    Additional comments: 1 month f/u . No other concens      Last edited by Estill Hemming, CMA on 01/29/2024  1:43 PM.       Discussed the use of AI scribe software for clinical note transcription with the patient, who gave verbal consent to proceed.  History of Present Illness Rebekah Hamilton Robert is a 78 year old female with hypertension and cerebrovascular disease who presents for a follow-up on her chronic conditions and memory concerns.  She experiences significant stress, which she believes affects her blood pressure, but her readings remain below 140/90 mmHg. She continues her antihypertensive regimen. She takes Cymbalta  30 mg daily and Wellbutrin  300 mg daily for depression and anxiety but has not pursued further evaluation with psychology or psychiatry services.  She has ongoing memory issues, which she associates with a fall two years ago and prolonged anesthesia during back surgery. A previous brain scan indicated memory loss, and her neurologist concluded no further visits were necessary.  She experiences hearing issues and is scheduled for an audiology evaluation. Orthotic pads from a podiatrist help with foot issues, and she has not pursued surgery. She walks with neighbors three times a week, which she finds beneficial.  She is retired and participates in social activities, including church and outings with friends. She helps friends with medical appointments, which can be overwhelming. She is concerned about her two sick children who live far away, adding to her stress. Her brother has moved in to help care for her, but their relationship can be  tense.       11/07/2023   11:19 AM 12/14/2022    3:05 PM 06/08/2022    1:23 AM  Depression screen PHQ 2/9  Decreased Interest 2 1 0  Down, Depressed, Hopeless 2 1 0  PHQ - 2 Score 4 2 0  Altered sleeping 2 0   Tired, decreased energy 2 1   Change in appetite 2 1   Feeling bad or failure about yourself  2 1   Trouble concentrating 2 0   Moving slowly or fidgety/restless 0 0   Suicidal thoughts 1 0   PHQ-9 Score 15 5   Difficult doing work/chores Somewhat difficult Somewhat difficult       11/07/2023   11:20 AM  GAD 7 : Generalized Anxiety Score  Nervous, Anxious, on Edge 1  Control/stop worrying 1  Worry too much - different things 1  Trouble relaxing 1  Restless 0  Easily annoyed or irritable 2  Afraid - awful might happen 1  Total GAD 7 Score 7  Anxiety Difficulty Somewhat difficult    Medications: Outpatient Medications Prior to Visit  Medication Sig   Ascorbic Acid (VITAMIN C PO) 500 mg 2 (two) times daily.   aspirin 81 MG chewable tablet Chew by mouth daily.   buPROPion  (WELLBUTRIN  XL) 300 MG 24 hr tablet Take 1 tablet (300 mg total) by mouth daily.   butalbital -acetaminophen -caffeine  (FIORICET) 50-325-40 MG tablet Take 1 tablet by mouth every 6 (six) hours as needed for headache.   Cholecalciferol (VITAMIN D  PO) Take by  mouth. Takes 10000 to 12000 units daily.   DULoxetine  (CYMBALTA ) 30 MG capsule TAKE 3 CAPSULES BY MOUTH DAILY FOR MOOD AND CHRONIC PAIN   fexofenadine (ALLEGRA) 180 MG tablet Take 180 mg by mouth daily.   hydrochlorothiazide  (HYDRODIURIL ) 25 MG tablet Take 1/2 -1 tablet daily for BP and fluid for goal <130/80.   pravastatin  (PRAVACHOL ) 40 MG tablet Take one tablet at bedtime   buPROPion  (WELLBUTRIN  XL) 150 MG 24 hr tablet Take 1 tablet every Morning for Mood, Focus & Concentration (Patient not taking: Reported on 01/29/2024)   No facility-administered medications prior to visit.    Review of Systems All negative Except see HPI        Objective    Resp 16   Ht 5' 4 (1.626 m)   Wt 156 lb (70.8 kg)   SpO2 100%   BMI 26.78 kg/m     Physical Exam Vitals reviewed.  Constitutional:      General: She is not in acute distress.    Appearance: Normal appearance. She is well-developed. She is not diaphoretic.  HENT:     Head: Normocephalic and atraumatic.   Eyes:     General: No scleral icterus.    Conjunctiva/sclera: Conjunctivae normal.   Neck:     Thyroid : No thyromegaly.   Cardiovascular:     Rate and Rhythm: Normal rate and regular rhythm.     Pulses: Normal pulses.     Heart sounds: Normal heart sounds. No murmur heard. Pulmonary:     Effort: Pulmonary effort is normal. No respiratory distress.     Breath sounds: Normal breath sounds. No wheezing, rhonchi or rales.   Musculoskeletal:     Cervical back: Neck supple.     Right lower leg: No edema.     Left lower leg: No edema.  Lymphadenopathy:     Cervical: No cervical adenopathy.   Skin:    General: Skin is warm and dry.     Findings: No rash.   Neurological:     Mental Status: She is alert and oriented to person, place, and time. Mental status is at baseline.   Psychiatric:        Mood and Affect: Mood normal.        Behavior: Behavior normal.      No results found for any visits on 01/29/24.      Assessment & Plan Depression and Anxiety Managed with Cymbalta  30 and Wellbutrin  300. Stress and memory issues may exacerbate symptoms. Referral to psychiatry for further evaluation and potential medication adjustment discussed. - Contact scheduler to arrange appointment with Dr. Kapoor, psychiatrist in [place]. - Encourage continuation of current medications, ensuring Wellbutrin  is taken in the morning.  Memory Loss/mild cognitive impairment Possibly related to past fall and prolonged anesthesia. Previous MRI showed significant cerebrovascular disease. Discussed potential for correctable factors and importance of neurological  evaluation. - Consider a Referral to neurology for reassessment and evaluation of memory loss. - Encourage physical activity to potentially improve cognitive function. Will follow-up  Hypertension Chronic Blood pressure fluctuates, likely due to stress. No recent readings above 140/90 mmHg. Emphasized importance of monitoring and potential need for medication if consistently exceeds 140/90 mmHg. - Continue monitoring blood pressure at home. - Reassess need for antihypertensive medication/hydrochlorothiazide  25 if blood pressure consistently exceeds 140/90 mmHg.  Hyperlipidemia Chronic Lipid levels well-controlled with current treatment/pravastatin  40. - Continue current cholesterol medication. Will follow-up  Hearing Loss Hearing issues reported but not severe enough for insurance coverage of  hearing aids. Follow-up with audiology scheduled. - Attend audiology appointment for further evaluation and potential hearing aid fitting.  Foot Pain Managed with orthotic pads. No desire for surgical intervention. Discussed non-surgical management options. - Continue use of orthotic pads. - Contact podiatry for further management if needed.   Essential hypertension  - Comprehensive metabolic panel with GFR - CBC with Differential/Platelet   Hyperlipidemia, mixed  - Comprehensive metabolic panel with GFR - CBC with Differential/Platelet   Stage 3a chronic kidney disease (HCC) Chronic -bmp Will follow-up   Vitamin D  deficiency Chronic Will follow-up  No orders of the defined types were placed in this encounter.   No follow-ups on file.   The patient was advised to call back or seek an in-person evaluation if the symptoms worsen or if the condition fails to improve as anticipated.  I discussed the assessment and treatment plan with the patient. The patient was provided an opportunity to ask questions and all were answered. The patient agreed with the plan and demonstrated an  understanding of the instructions.  I, Kamisha Ell, PA-C have reviewed all documentation for this visit. The documentation on 01/29/2024  for the exam, diagnosis, procedures, and orders are all accurate and complete.  Blane Bunting, [facility], MMS [facility] [phone] (phone) [phone] (fax)  [facility]

## 2024-01-30 LAB — COMPREHENSIVE METABOLIC PANEL WITH GFR
ALT: 21 IU/L (ref 0–32)
AST: 22 IU/L (ref 0–40)
Albumin: 4.8 g/dL (ref 3.8–4.8)
Alkaline Phosphatase: 79 IU/L (ref 44–121)
BUN/Creatinine Ratio: 20 (ref 12–28)
BUN: 21 mg/dL (ref 8–27)
Bilirubin Total: 0.3 mg/dL (ref 0.0–1.2)
CO2: 22 mmol/L (ref 20–29)
Calcium: 10.4 mg/dL — ABNORMAL HIGH (ref 8.7–10.3)
Chloride: 105 mmol/L (ref 96–106)
Creatinine, Ser: 1.07 mg/dL — ABNORMAL HIGH (ref 0.57–1.00)
Globulin, Total: 2.1 g/dL (ref 1.5–4.5)
Glucose: 77 mg/dL (ref 70–99)
Potassium: 5 mmol/L (ref 3.5–5.2)
Sodium: 140 mmol/L (ref 134–144)
Total Protein: 6.9 g/dL (ref 6.0–8.5)
eGFR: 54 mL/min/{1.73_m2} — ABNORMAL LOW (ref >59)

## 2024-01-30 LAB — CBC WITH DIFFERENTIAL/PLATELET
Basophils Absolute: 0.1 10*3/uL (ref 0.0–0.2)
Basos: 1 %
EOS (ABSOLUTE): 0.2 10*3/uL (ref 0.0–0.4)
Eos: 2 %
Hematocrit: 48.3 % — ABNORMAL HIGH (ref 34.0–46.6)
Hemoglobin: 15.2 g/dL (ref 11.1–15.9)
Immature Grans (Abs): 0 10*3/uL (ref 0.0–0.1)
Immature Granulocytes: 0 %
Lymphocytes Absolute: 3.2 10*3/uL — ABNORMAL HIGH (ref 0.7–3.1)
Lymphs: 31 %
MCH: 30.2 pg (ref 26.6–33.0)
MCHC: 31.5 g/dL (ref 31.5–35.7)
MCV: 96 fL (ref 79–97)
Monocytes Absolute: 0.9 10*3/uL (ref 0.1–0.9)
Monocytes: 9 %
Neutrophils Absolute: 5.8 10*3/uL (ref 1.4–7.0)
Neutrophils: 57 %
Platelets: 326 10*3/uL (ref 150–450)
RBC: 5.03 x10E6/uL (ref 3.77–5.28)
RDW: 13.9 % (ref 11.7–15.4)
WBC: 10.1 10*3/uL (ref 3.4–10.8)

## 2024-01-31 ENCOUNTER — Ambulatory Visit: Payer: Self-pay | Admitting: Physician Assistant
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
## 2024-04-09 ENCOUNTER — Encounter: Payer: Self-pay | Admitting: Psychiatry

## 2024-04-09 ENCOUNTER — Ambulatory Visit (INDEPENDENT_AMBULATORY_CARE_PROVIDER_SITE_OTHER): Payer: Self-pay | Admitting: Psychiatry

## 2024-04-09 ENCOUNTER — Other Ambulatory Visit: Payer: Self-pay

## 2024-04-09 VITALS — BP 116/76 | HR 75 | Temp 97.5°F | Ht 64.0 in | Wt 153.2 lb

## 2024-04-09 DIAGNOSIS — F331 Major depressive disorder, recurrent, moderate: Secondary | ICD-10-CM

## 2024-04-09 DIAGNOSIS — F419 Anxiety disorder, unspecified: Secondary | ICD-10-CM | POA: Diagnosis not present

## 2024-04-09 MED ORDER — DULOXETINE HCL 30 MG PO CPEP: 90.0000 mg | ORAL_CAPSULE | Freq: Every day | ORAL | 1 refills | Status: AC

## 2024-04-09 NOTE — Patient Instructions (Signed)
 Increase duloxetine  90 mg daily  Continue bupropion  300 mg daily  Next appointment: 10/20 at 4 pm

## 2024-04-16 ENCOUNTER — Ambulatory Visit: Payer: Self-pay

## 2024-04-16 ENCOUNTER — Ambulatory Visit (HOSPITAL_COMMUNITY)
Admission: EM | Admit: 2024-04-16 | Discharge: 2024-04-16 | Disposition: A | Discharge status: Home / Self Care | Attending: Psychiatry | Admitting: Psychiatry

## 2024-04-16 ENCOUNTER — Telehealth: Payer: Self-pay | Admitting: Psychiatry

## 2024-04-16 DIAGNOSIS — F32A Depression, unspecified: Secondary | ICD-10-CM | POA: Diagnosis not present

## 2024-04-16 DIAGNOSIS — F418 Other specified anxiety disorders: Secondary | ICD-10-CM

## 2024-04-16 DIAGNOSIS — Z599 Problem related to housing and economic circumstances, unspecified: Secondary | ICD-10-CM

## 2024-04-16 DIAGNOSIS — Z5986 Financial insecurity: Secondary | ICD-10-CM | POA: Diagnosis not present
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
## 2024-04-16 NOTE — Discharge Instructions (Signed)
 Follow-up with the police department/[facility]
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
## 2024-04-16 NOTE — Telephone Encounter (Signed)
 PT called very upset stating she needed to be seen ASAP due to panic attacks. I advised her to go to [facility].  She has a follow up scheduled in October.

## 2024-04-18 ENCOUNTER — Telehealth: Payer: Self-pay

## 2024-04-18 ENCOUNTER — Other Ambulatory Visit: Payer: Self-pay | Admitting: Physician Assistant

## 2024-04-18 DIAGNOSIS — F339 Major depressive disorder, recurrent, unspecified: Secondary | ICD-10-CM

## 2024-04-18 MED ORDER — BUPROPION HCL ER (XL) 300 MG PO TB24: 300.0000 mg | ORAL_TABLET | Freq: Every day | ORAL | 0 refills | Status: DC

## 2024-04-18 NOTE — Telephone Encounter (Signed)
 Copied from CRM [id number]. Topic: Clinical - Medication Refill >> Apr 18, 2024 11:22 AM Precious C wrote: Medication: buPROPion  (WELLBUTRIN  XL) 300 MG 24 hr tablet   Has the patient contacted their pharmacy? No, call pcp instead (Agent: If no, request that the patient contact the pharmacy for the refill. If patient does not wish to contact the pharmacy document the reason why and proceed with request.) (Agent: If yes, when and what did the pharmacy advise?)  This is the patient's preferred pharmacy:  [id number] [place], [place] - [address] AT [facility] OF [address] [place] [address] Phone: [phone] Fax: [phone]  Is this the correct pharmacy for this prescription? Yes If no, delete pharmacy and type the correct one.   Has the prescription been filled recently? No  Is the patient out of the medication? Yes  Has the patient been seen for an appointment in the last year OR does the patient have an upcoming appointment? Yes  Can we respond through MyChart? No  Agent: Please be advised that Rx refills may take up to 3 business days. We ask that you follow-up with your pharmacy.

## 2024-04-18 NOTE — Telephone Encounter (Signed)
 pt states that you referred her to [facility] and she wanted to know if there was a place to go to that is closer to where she lives at.

## 2024-04-18 NOTE — Telephone Encounter (Signed)
 Requested Prescriptions  Pending Prescriptions Disp Refills   buPROPion  (WELLBUTRIN  XL) 300 MG 24 hr tablet 180 tablet 0    Sig: Take 1 tablet (300 mg total) by mouth daily.     Psychiatry: Antidepressants - bupropion  Failed - 04/18/2024  3:38 PM      Failed - Cr in normal range and within 360 days    Creat  Date Value Ref Range Status  06/28/2023 1.04 (H) 0.60 - 1.00 mg/dL Final   Creatinine, Ser  Date Value Ref Range Status  01/29/2024 1.07 (H) 0.57 - 1.00 mg/dL Final   Creatinine, Urine  Date Value Ref Range Status  06/28/2023 200 20 - 275 mg/dL Final         Passed - AST in normal range and within 360 days    AST  Date Value Ref Range Status  01/29/2024 22 0 - 40 IU/L Final         Passed - ALT in normal range and within 360 days    ALT  Date Value Ref Range Status  01/29/2024 21 0 - 32 IU/L Final         Passed - Completed PHQ-2 or PHQ-9 in the last 360 days      Passed - Last BP in normal range    BP Readings from Last 1 Encounters:  01/29/24 (!) 144/71         Passed - Valid encounter within last 6 months    Recent Outpatient Visits           2 months ago Mild cognitive impairment   [facility] [place], [place], PA-C   3 months ago Essential hypertension   [facility] [place], [place], PA-C   5 months ago SOB (shortness of breath)   [facility] [place], [place], PA-C   5 months ago Encounter to establish care   [facility] [place], [place], PA-C

## 2024-04-18 NOTE — Telephone Encounter (Signed)
 I believe we sent the referral to the [facility] office. If she would prefer a location closer to her, I would recommend that she contact her insurance provider to identify an in-network option.

## 2024-04-23 ENCOUNTER — Encounter: Payer: Self-pay | Admitting: Physician Assistant

## 2024-04-23 ENCOUNTER — Ambulatory Visit (INDEPENDENT_AMBULATORY_CARE_PROVIDER_SITE_OTHER): Admitting: Physician Assistant

## 2024-04-23 VITALS — BP 158/78 | HR 67 | Resp 16 | Ht 64.0 in | Wt 152.0 lb

## 2024-04-23 DIAGNOSIS — H9193 Unspecified hearing loss, bilateral: Secondary | ICD-10-CM

## 2024-04-23 DIAGNOSIS — G3184 Mild cognitive impairment, so stated: Secondary | ICD-10-CM | POA: Diagnosis not present

## 2024-04-23 DIAGNOSIS — F339 Major depressive disorder, recurrent, unspecified: Secondary | ICD-10-CM

## 2024-04-23 DIAGNOSIS — E782 Mixed hyperlipidemia: Secondary | ICD-10-CM | POA: Diagnosis not present

## 2024-04-23 DIAGNOSIS — M79673 Pain in unspecified foot: Secondary | ICD-10-CM

## 2024-04-23 DIAGNOSIS — N1831 Chronic kidney disease, stage 3a: Secondary | ICD-10-CM | POA: Diagnosis not present

## 2024-04-23 DIAGNOSIS — E559 Vitamin D deficiency, unspecified: Secondary | ICD-10-CM | POA: Diagnosis not present

## 2024-04-23 DIAGNOSIS — I10 Essential (primary) hypertension: Secondary | ICD-10-CM
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
## 2024-04-30 ENCOUNTER — Ambulatory Visit: Admitting: Physician Assistant

## 2024-04-30 ENCOUNTER — Telehealth: Payer: Self-pay | Admitting: Licensed Clinical Social Worker

## 2024-04-30 ENCOUNTER — Encounter: Payer: Self-pay | Admitting: Physician Assistant

## 2024-04-30 VITALS — BP 124/67 | HR 73 | Resp 14 | Ht 64.0 in | Wt 151.0 lb

## 2024-04-30 DIAGNOSIS — Z23 Encounter for immunization: Secondary | ICD-10-CM

## 2024-04-30 DIAGNOSIS — I10 Essential (primary) hypertension: Secondary | ICD-10-CM | POA: Diagnosis not present

## 2024-04-30 DIAGNOSIS — G3184 Mild cognitive impairment, so stated: Secondary | ICD-10-CM

## 2024-04-30 DIAGNOSIS — F339 Major depressive disorder, recurrent, unspecified: Secondary | ICD-10-CM

## 2024-04-30 DIAGNOSIS — F419 Anxiety disorder, unspecified: Secondary | ICD-10-CM

## 2024-04-30 DIAGNOSIS — E782 Mixed hyperlipidemia: Secondary | ICD-10-CM | POA: Diagnosis not present

## 2024-04-30 MED ORDER — PRAVASTATIN SODIUM 40 MG PO TABS: ORAL_TABLET | ORAL | 3 refills | Status: AC
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
## 2024-04-30 NOTE — Telephone Encounter (Signed)
 Attempted to contact pt to schedule appointment x 2 unsuccessfully. Pt needs virtual IBH 60 min 05/03/24   Left HIPAA compliant voice message and informed pt to call [facility] to schedule appt.
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
## 2024-05-01 ENCOUNTER — Ambulatory Visit: Payer: Self-pay | Admitting: Physician Assistant

## 2024-05-01 LAB — LIPID PANEL WITH LDL/HDL RATIO
Cholesterol, Total: 162 mg/dL (ref 100–199)
HDL: 50 mg/dL (ref >39)
LDL Chol Calc (NIH): 93 mg/dL (ref 0–99)
LDL/HDL Ratio: 1.9 ratio (ref 0.0–3.2)
Triglycerides: 102 mg/dL (ref 0–149)
VLDL Cholesterol Cal: 19 mg/dL (ref 5–40)

## 2024-05-02 ENCOUNTER — Encounter: Payer: Self-pay | Admitting: Psychiatry

## 2024-05-02 ENCOUNTER — Ambulatory Visit (INDEPENDENT_AMBULATORY_CARE_PROVIDER_SITE_OTHER): Admitting: Psychiatry

## 2024-05-02 VITALS — BP 122/76 | HR 82 | Temp 97.9°F | Ht 64.0 in | Wt 150.2 lb

## 2024-05-02 DIAGNOSIS — F331 Major depressive disorder, recurrent, moderate: Secondary | ICD-10-CM | POA: Diagnosis not present

## 2024-05-02 DIAGNOSIS — F419 Anxiety disorder, unspecified: Secondary | ICD-10-CM

## 2024-05-02 MED ORDER — MIRTAZAPINE 15 MG PO TABS: ORAL_TABLET | ORAL | 1 refills | Status: DC

## 2024-05-02 NOTE — Patient Instructions (Addendum)
 Continue duloxetine  90 mg daily  Continue bupropion  300 mg daily  Start mirtazapine  7.5 mg at night for one week, then 15 mg at night Next appointment: 10/20 at 4 pm

## 2024-05-03 NOTE — Progress Notes (Signed)
 Pharmacy Quality Measure Review  This patient is appearing on a report for being at risk of failing the adherence measure for cholesterol (statin) medications this calendar year.   Medication: pravastatin  40 mg Last fill date: 04/30/24 for 90 day supply  Insurance report was not up to date. No action needed at this time.   Mckinley Adelstein E. Marsh, PharmD Clinical Pharmacist [facility] [phone]

## 2024-05-22 DIAGNOSIS — H524 Presbyopia: Secondary | ICD-10-CM | POA: Diagnosis not present

## 2024-05-22 DIAGNOSIS — H35013 Changes in retinal vascular appearance, bilateral: Secondary | ICD-10-CM | POA: Diagnosis not present

## 2024-05-22 DIAGNOSIS — H04123 Dry eye syndrome of bilateral lacrimal glands: Secondary | ICD-10-CM | POA: Diagnosis not present

## 2024-05-22 DIAGNOSIS — H43393 Other vitreous opacities, bilateral: Secondary | ICD-10-CM | POA: Diagnosis not present

## 2024-05-22 DIAGNOSIS — H0102A Squamous blepharitis right eye, upper and lower eyelids: Secondary | ICD-10-CM | POA: Diagnosis not present

## 2024-05-28 ENCOUNTER — Ambulatory Visit (INDEPENDENT_AMBULATORY_CARE_PROVIDER_SITE_OTHER): Admitting: Physician Assistant

## 2024-05-28 ENCOUNTER — Encounter: Payer: Self-pay | Admitting: Physician Assistant

## 2024-05-28 VITALS — BP 140/85 | HR 77 | Resp 14 | Ht 64.0 in | Wt 153.5 lb

## 2024-05-28 DIAGNOSIS — I10 Essential (primary) hypertension: Secondary | ICD-10-CM

## 2024-05-28 DIAGNOSIS — G3184 Mild cognitive impairment, so stated: Secondary | ICD-10-CM

## 2024-05-28 DIAGNOSIS — E782 Mixed hyperlipidemia: Secondary | ICD-10-CM | POA: Diagnosis not present

## 2024-05-28 DIAGNOSIS — F339 Major depressive disorder, recurrent, unspecified: Secondary | ICD-10-CM | POA: Diagnosis not present

## 2024-05-28 DIAGNOSIS — F419 Anxiety disorder, unspecified: Secondary | ICD-10-CM | POA: Diagnosis not present

## 2024-05-28 NOTE — Progress Notes (Signed)
 Established patient visit  Patient: Rebekah Hamilton   DOB: 03-18-1946   78 y.o. Female  MRN: [id number] Visit Date: 05/28/2024  Today's healthcare provider: Jolynn Spencer, PA-C   Chief Complaint  Patient presents with   Medical Management of Chronic Issues    Pt reports taking all medicine and doing a little better from last OV.    Subjective     HPI     Medical Management of Chronic Issues    Additional comments: Pt reports taking all medicine and doing a little better from last OV.       Last edited by Wilfred Hargis RAMAN, CMA on 05/28/2024  8:19 AM.           05/28/2024    8:20 AM 04/23/2024   10:04 AM 04/09/2024    2:37 PM  Depression screen PHQ 2/9  Decreased Interest 2 3   Down, Depressed, Hopeless 2 3   PHQ - 2 Score 4 6   Altered sleeping 0 1   Tired, decreased energy 1 2   Change in appetite 1 2   Feeling bad or failure about yourself  2 2   Trouble concentrating 1 2   Moving slowly or fidgety/restless 1 1   Suicidal thoughts  1   PHQ-9 Score 10 17   Difficult doing work/chores Somewhat difficult Somewhat difficult      Information is confidential and restricted. Go to Review Flowsheets to unlock data.      05/28/2024    8:21 AM 04/23/2024   10:04 AM 04/09/2024    2:37 PM 11/07/2023   11:20 AM  GAD 7 : Generalized Anxiety Score  Nervous, Anxious, on Edge 1 3  1   Control/stop worrying 1 3  1   Worry too much - different things 1 2  1   Trouble relaxing 1 2  1   Restless 1 0  0  Easily annoyed or irritable 1 1  2   Afraid - awful might happen 1 3  1   Total GAD 7 Score 7 14  7   Anxiety Difficulty Somewhat difficult Somewhat difficult  Somewhat difficult     Information is confidential and restricted. Go to Review Flowsheets to unlock data.    Medications: Outpatient Medications Prior to Visit  Medication Sig   Ascorbic Acid (VITAMIN C PO) 500 mg 2 (two) times daily.   aspirin 81 MG chewable tablet Chew by mouth daily.   buPROPion  (WELLBUTRIN  XL)  300 MG 24 hr tablet Take 300 mg by mouth daily.   butalbital -acetaminophen -caffeine  (FIORICET) 50-325-40 MG tablet Take 1 tablet by mouth every 6 (six) hours as needed for headache.   Cholecalciferol (VITAMIN D  PO) Take by mouth. Takes 10000 to 12000 units daily.   DULoxetine  (CYMBALTA ) 30 MG capsule TAKE 3 CAPSULES BY MOUTH DAILY FOR MOOD AND CHRONIC PAIN   DULoxetine  (CYMBALTA ) 30 MG capsule Take 3 capsules (90 mg total) by mouth daily.   fexofenadine (ALLEGRA) 180 MG tablet Take 180 mg by mouth daily.   hydrochlorothiazide  (HYDRODIURIL ) 25 MG tablet Take 1/2 -1 tablet daily for BP and fluid for goal <130/80.   mirtazapine  (REMERON ) 15 MG tablet 7.5 mg at night for one week, then 15 mg at night   pravastatin  (PRAVACHOL ) 40 MG tablet Take one tablet at bedtime   No facility-administered medications prior to visit.    Review of Systems All negative Except see HPI       Objective    BP (!) 140/85   Pulse 77  Resp 14   Ht 5' 4 (1.626 m)   Wt 153 lb 8 oz (69.6 kg)   SpO2 99%   BMI 26.35 kg/m     Physical Exam   No results found for any visits on 05/28/24.      Assessment & Plan  Depression, recurrent (Primary) Anxiety Chronic and improving Saw Dr. Hisada and clinic pharmacist for medication adjustment Clinic counselor attempted to contact pt x2 and advised pt to contact [facility] to schedule an appointment Continue duloxetine  and wellbutrin  300mg  Will follow-up with dr. Vickey  Hyperlipidemia, mixed Chronic Continue pravastatin  40mg  Continue lifestyle modifications Will follow-up  Essential hypertension Chronic and unstable Continue hydrochlorothiazide  25mg  Question compliance Advised to measure BP at home daily, bring BP records, all meds to the next appt Continue lifestyle modifications Will follow-up  Mild cognitive impairment With moca 23/30, per neurology Continue asa 81mg  Follow-up with neurology   No orders of the defined types were placed in this  encounter.   Return in about 2 months (around 07/28/2024) for chronic disease f/u.   The patient was advised to call back or seek an in-person evaluation if the symptoms worsen or if the condition fails to improve as anticipated.  I discussed the assessment and treatment plan with the patient. The patient was provided an opportunity to ask questions and all were answered. The patient agreed with the plan and demonstrated an understanding of the instructions.  I, Euell Schiff, PA-C have reviewed all documentation for this visit. The documentation on 05/28/2024  for the exam, diagnosis, procedures, and orders are all accurate and complete.  Jolynn Spencer, [facility], MMS [facility] [phone] (phone) [phone] (fax)  [facility]

## 2024-06-01 NOTE — Progress Notes (Unsigned)
 BH MD/PA/NP OP Progress Note  06/03/2024 5:37 PM Rebekah Hamilton  MRN:  [id number]  Chief Complaint:  Chief Complaint  Patient presents with   Follow-up   HPI:  This is a follow-up appointment for depression and anxiety.  She states that she reports stress related to her brother, who is oil and water.  Although she used to have friends, some of them passed away, and she did not hear from the other.  Other connection was through her ex-husband.  While she has family in [place], she is unsure what she would like to do.  When she was asked about the bank account, she states that she had to come to accept it.  She has pretty good retirement, and she will not be a homeless.  Although it is a stress to lose the money she invested, which she inherited from her mother, she denies much concern about this.  She is concerned about her memory.  She has difficulty in direction.  She was not like this before.  She recalls seen by neurologist in the past and has agreed to contact the clinic to make a follow-up.  She has been sleeping well.  There has been fluctuation in her appetite and she has been eating snacks, which has been going on even before starting mirtazapine .  She feels down at times.  She denies SI, HI, hallucinations.  She denies any drowsiness or fall.  She agrees with the plans as outlined below.   Wt Readings from Last 3 Encounters:  06/03/24 155 lb 9.6 oz (70.6 kg)  05/28/24 153 lb 8 oz (69.6 kg)  05/02/24 150 lb 3.2 oz (68.1 kg)      Substance use   Tobacco Alcohol Other substances/  Current denies Occasional drinks denies  Past denies Occasional drinks denies  Past Treatment            Support: Household: brother Marital status: widow, married twice Number of children: 2 (daughter in [place] , son lives in [place]) Employment: retired in 2023, doing Audiological scientist for 23 years Education:  Visit Diagnosis:    ICD-10-CM   1. MDD (major depressive disorder), recurrent  episode, mild  F33.0     2. Anxiety disorder, unspecified type  F41.9       Past Psychiatric History: Please see initial evaluation for full details. I have reviewed the history. No updates at this time.     Past Medical History:  Past Medical History:  Diagnosis Date   Allergy    Anemia    Anxiety    Depression    Hyperlipidemia    Hypertension    Lumbar stenosis    Migraines    Prediabetes     Past Surgical History:  Procedure Laterality Date   BREAST LUMPECTOMY Bilateral    CHOLECYSTECTOMY  1999   SPINE SURGERY      Family Psychiatric History: Please see initial evaluation for full details. I have reviewed the history. No updates at this time.     Family History:  Family History  Problem Relation Age of Onset   Stroke Mother    Hypertension Mother    Heart attack Father 42   Other Maternal Grandmother        blood clot after surgery   Heart attack Paternal Grandmother     Social History:  Social History   Socioeconomic History   Marital status: Widowed    Spouse name: Not on file   Number of children: 2  Years of education: Not on file   Highest education level: Associate degree: academic program  Occupational History   Occupation: accountant  Tobacco Use   Smoking status: Never   Smokeless tobacco: Never  Vaping Use   Vaping status: Never Used  Substance and Sexual Activity   Alcohol use: Yes    Alcohol/week: 2.0 standard drinks of alcohol    Types: 2 Glasses of wine per week   Drug use: No   Sexual activity: Not Currently  Other Topics Concern   Not on file  Social History Narrative   Not on file   Social Drivers of Health   Financial Resource Strain: Low Risk  (12/21/2023)   Overall Financial Resource Strain (CARDIA)    Difficulty of Paying Living Expenses: Not hard at all  Food Insecurity: No Food Insecurity (12/21/2023)   Hunger Vital Sign    Worried About Running Out of Food in the Last Year: Never true    Ran Out of Food in the Last  Year: Never true  Transportation Needs: No Transportation Needs (12/21/2023)   PRAPARE - Administrator, Civil Service (Medical): No    Lack of Transportation (Non-Medical): No  Physical Activity: Insufficiently Active (12/21/2023)   Exercise Vital Sign    Days of Exercise per Week: 2 days    Minutes of Exercise per Session: 30 min  Stress: Stress Concern Present (12/21/2023)   [employer] of Occupational Health - Occupational Stress Questionnaire    Feeling of Stress : To some extent  Social Connections: Moderately Integrated (12/21/2023)   Social Connection and Isolation Panel    Frequency of Communication with Friends and Family: More than three times a week    Frequency of Social Gatherings with Friends and Family: Once a week    Attends Religious Services: More than 4 times per year    Active Member of [employer] or Organizations: Yes    Attends Banker Meetings: More than 4 times per year    Marital Status: Widowed    Allergies:  Allergies  Allergen Reactions   Augmentin [Amoxicillin-Pot Clavulanate] Diarrhea   Doxycycline  Nausea Only   Egg White (Diagnostic) Nausea Only   Prozac [Fluoxetine Hcl] Other (See Comments)    Disoriented    Metabolic Disorder Labs: Lab Results  Component Value Date   HGBA1C 5.7 (H) 11/07/2023   MPG 123 09/14/2022   MPG 120 06/08/2022   No results found for: PROLACTIN Lab Results  Component Value Date   CHOL 162 04/30/2024   TRIG 102 04/30/2024   HDL 50 04/30/2024   CHOLHDL 3.1 11/07/2023   VLDL 25 02/20/2017   LDLCALC 93 04/30/2024   LDLCALC 89 11/07/2023   Lab Results  Component Value Date   TSH 2.280 11/07/2023   TSH 2.39 06/28/2023    Therapeutic Level Labs: No results found for: LITHIUM No results found for: VALPROATE No results found for: CBMZ  Current Medications: Current Outpatient Medications  Medication Sig Dispense Refill   Ascorbic Acid (VITAMIN C PO) 500 mg 2 (two) times daily.      aspirin 81 MG chewable tablet Chew by mouth daily.     buPROPion  (WELLBUTRIN  XL) 300 MG 24 hr tablet Take 300 mg by mouth daily.     butalbital -acetaminophen -caffeine  (FIORICET) 50-325-40 MG tablet Take 1 tablet by mouth every 6 (six) hours as needed for headache. 10 tablet 5   Cholecalciferol (VITAMIN D  PO) Take by mouth. Takes 10000 to 12000 units daily.  DULoxetine  (CYMBALTA ) 30 MG capsule TAKE 3 CAPSULES BY MOUTH DAILY FOR MOOD AND CHRONIC PAIN 270 capsule 3   DULoxetine  (CYMBALTA ) 30 MG capsule Take 3 capsules (90 mg total) by mouth daily. 90 capsule 1   fexofenadine (ALLEGRA) 180 MG tablet Take 180 mg by mouth daily.     hydrochlorothiazide  (HYDRODIURIL ) 25 MG tablet Take 1/2 -1 tablet daily for BP and fluid for goal <130/80. 90 tablet 1   [START ON 07/01/2024] mirtazapine  (REMERON ) 15 MG tablet Take 1 tablet (15 mg total) by mouth at bedtime. 7.5 mg at night for one week, then 15 mg at night 30 tablet 0   pravastatin  (PRAVACHOL ) 40 MG tablet Take one tablet at bedtime 90 tablet 3   No current facility-administered medications for this visit.     Musculoskeletal: Strength & Muscle Tone: within normal limits Gait & Station: normal Patient leans: N/A  Psychiatric Specialty Exam: Review of Systems  Psychiatric/Behavioral:  Positive for dysphoric mood and sleep disturbance. Negative for agitation, behavioral problems, confusion, decreased concentration, hallucinations, self-injury and suicidal ideas. The patient is nervous/anxious. The patient is not hyperactive.   All other systems reviewed and are negative.   Blood pressure 124/79, pulse 86, temperature (!) 97 F (36.1 C), temperature source Temporal, height 5' 4 (1.626 m), weight 155 lb 9.6 oz (70.6 kg).Body mass index is 26.71 kg/m.  General Appearance: Neat  Eye Contact:  Good  Speech:  Clear and Coherent  Volume:  Normal  Mood:  Anxious  Affect:  Appropriate, Congruent, and calm  Thought Process:  Coherent  Orientation:   Full (Time, Place, and Person)  Thought Content: Logical   Suicidal Thoughts:  No  Homicidal Thoughts:  No  Memory:  Immediate;   Good  Judgement:  Good  Insight:  Good  Psychomotor Activity:  Normal  Concentration:  Concentration: Good and Attention Span: Good  Recall:  Good  Fund of Knowledge: Good  Language: Good  Akathisia:  No  Handed:  Right  AIMS (if indicated): not done  Assets:  Communication Skills Desire for Improvement  ADL's:  Intact  Cognition: WNL  Sleep:  Fair   Screenings: GAD-7    Garment/textile technologist Visit from 05/28/2024 in [facility] Office Visit from 04/23/2024 in [facility] Visit from 04/09/2024 in [facility] Office Visit from 11/07/2023 in [facility]  Total GAD-7 Score 7 14 7 7    PHQ2-9    Flowsheet Row Office Visit from 05/28/2024 in [facility] Office Visit from 04/23/2024 in [facility] Office Visit from 04/09/2024 in [facility] Office Visit from 11[facility]07/2023 in [facility] Office Visit from 12/14/2022 in [facility]  PHQ-2 Total Score 4 6 2 4 2   PHQ-9 Total Score 10 17 12 15 5    Flowsheet Row ED from 04/16/2024 in [facility] Office Visit from 04/09/2024 in [facility]  C-SSRS RISK CATEGORY Error: Q3, 4, or 5 should not be populated when Q2 is No Error: Q3, 4, or 5 should not be populated when Q2 is No     Assessment and Plan:  Rebekah Hamilton is a 78 y.o. year old female with a history of depression, anxiety, cognitive impairment, memory loss, hypertension, hyperlipidemia, s/p posterior lumbar interbody fusion L3-L4, who presents for follow up appointment for  below.    1. MDD (major depressive  disorder), recurrent episode, mild 2. Anxiety disorder, unspecified type She experiences migraines and has been struggling with memory loss since a fall at her grandchild's wedding in 2024. She experienced multiple profound losses, including the suicide of her first husband, the death of close friends and her primary care provider, and her retirement in 2023. Her son struggles with PTSD, and her daughter has endured the loss of both her husband and child, now raising her grandchildren.  History: no admission, no SA. Originally on duloxetine  60 mg daily, bupropion  300 mg daily     The exam is notable for calmer affect.  While she was ruminating on losing stating account at her previous visit, she denies any concern about this, stating that she has come to accept it.  She now reports a more reasonable concern about her future, given her limited support.  and local connection.  Will maintain on the current medication regimen at this time given  her mood appears to be improving since starting mirtazapine , while monitoring side effect of increase in appetite.  Will continue duloxetine  to target depression and anxiety, along with bupropion  as adjunctive treatment for depression.  She was referred for CBT; she was advised to contact the clinic to make an appointment.    # memory loss She reports memory loss, and she has a diagnosis of cognitive impairment.  It is unclear whether this hacking is related to this condition.  She was seen by neurology for memory issues. MOCA reportedly 23/30 on 11/2023 per record.  She was advised to make a follow-up appointment.    Plan Continue duloxetine  90 mg daily  Continue bupropion  300 mg daily  Continue mirtazapine  15 mg at night Referred for CBT Next appointment: 12/9 at 4:30, IP   The patient demonstrates the following risk factors for suicide: Chronic risk factors for suicide include: psychiatric disorder of depression, anxiety and chronic pain. Acute risk factors  for suicide include: family or marital conflict, unemployment, and loss (financial, interpersonal, professional). Protective factors for this patient include: positive social support, responsibility to others (children, family), coping skills, and hope for the future. Considering these factors, the overall suicide risk at this point appears to be low. Patient is appropriate for outpatient follow up.   Collaboration of Care: Collaboration of Care: Other reviewed notes in Epic  Patient/Guardian was advised Release of Information must be obtained prior to any record release in order to collaborate their care with an outside provider. Patient/Guardian was advised if they have not already done so to contact the registration department to sign all necessary forms in order for us  to release information regarding their care.   Consent: Patient/Guardian gives verbal consent for treatment and assignment of benefits for services provided during this visit. Patient/Guardian expressed understanding and agreed to proceed.    Katheren Sleet, MD 06/03/2024, 5:37 PM

## 2024-06-03 ENCOUNTER — Encounter: Payer: Self-pay | Admitting: Psychiatry

## 2024-06-03 ENCOUNTER — Other Ambulatory Visit: Payer: Self-pay

## 2024-06-03 ENCOUNTER — Ambulatory Visit (INDEPENDENT_AMBULATORY_CARE_PROVIDER_SITE_OTHER): Admitting: Psychiatry

## 2024-06-03 VITALS — BP 124/79 | HR 86 | Temp 97.0°F | Ht 64.0 in | Wt 155.6 lb

## 2024-06-03 DIAGNOSIS — F330 Major depressive disorder, recurrent, mild: Secondary | ICD-10-CM | POA: Diagnosis not present

## 2024-06-03 DIAGNOSIS — F419 Anxiety disorder, unspecified: Secondary | ICD-10-CM | POA: Diagnosis not present

## 2024-06-03 MED ORDER — MIRTAZAPINE 15 MG PO TABS: 15.0000 mg | ORAL_TABLET | Freq: Every day | ORAL | 0 refills | Status: DC

## 2024-06-03 NOTE — Patient Instructions (Signed)
 Continue duloxetine  90 mg daily  Continue bupropion  300 mg daily  Continue mirtazapine  15 mg at night Next appointment: 12/9 at 4:30

## 2024-06-12 ENCOUNTER — Ambulatory Visit: Admitting: Orthopedic Surgery

## 2024-06-12 ENCOUNTER — Other Ambulatory Visit: Payer: Self-pay

## 2024-06-12 DIAGNOSIS — M545 Low back pain, unspecified: Secondary | ICD-10-CM | POA: Diagnosis not present

## 2024-06-12 NOTE — Progress Notes (Signed)
 Orthopedic Spine Surgery Office Note   Assessment: Patient is a 78 y.o. female with low back pain that radiates into the right lower extremity along the medial and lateral aspects of the thigh.  Has broken rods at the L3/4 level     Plan: -Patient has tried tylenol , ibuprofen, steroid injection, medrol  dosepak  -Patient was noticing symptomatic relief with chondroitin and glucosamine so I told her to keep taking those.  I told her that these are supplements that are fine to take with low risk of side effect -She previously responded to an injection was interested in that kind of treatment again.  Referral provided to her today for a lumbar transforaminal injection -I went over the fact that she has 3 out of 4 broken rods and that can be a cause of back pain.  Since her leg is now bothering her more than her can hold off on any kind of treatment.  If she does notice worsening back pain, I would get a CT scan to workup pseudoarthrosis -Patient should return to office on an as needed basis     Patient expressed understanding of the plan and all questions were answered to the patient's satisfaction.    __________________________________________________________________________   History: Patient is a 78 y.o. female who comes in today with worsening back and right leg pain.  She feels the pain over the lateral and medial aspects of the thigh.  It is felt more distally.  It does not radiate past the knee.  She does not have any pain of the left lower extremity.  She has noticed this pain for several months now.  There is no trauma or injury that preceded the onset of the pain.  She did find glucosamine/chondroitin helpful with the pain but stopped it because she is not sure if there were long-term side effects of taking it as medications.   Previous treatments:  tylenol , ibuprofen, cervical steroid injection, medrol  dosepak      Physical Exam:     General: no acute distress, appears stated  age Neurologic: alert, answering questions appropriately, following commands Respiratory: unlabored breathing on room air, symmetric chest rise Psychiatric: appropriate affect, normal cadence to speech     MSK (spine):   -Strength exam                                                   Left                  Right   EHL                              5/5                  5/5 TA                                 5/5                  5/5 GSC                             5/5  5/5 Knee extension            5/5                  5/5 Hip flexion                    5/5                  5/5   -Sensory exam                           Sensation intact to light touch in L3-S1 nerve distributions of bilateral lower extremities     Imaging: XRs of the lumbar spine from 06/12/2024 were independently reviewed and interpreted, showing long thoracolumbar fusion construct.  The top of the construct is not visualized.  There is a quad rod construct in the mid lumbar region.  Interbody devices are seen from T12/L1 to L5/S1.  Interbody's appear in appropriate position.  There are broken rods between the L3 and L4 screws on both sides and on the right accessory rod.  The left accessory rod appears intact.  No broken screws seen.  No fracture or dislocation seen.     Patient name: Rebekah Hamilton Patient MRN: [id number] Date of visit: 06/12/24

## 2024-06-16 ENCOUNTER — Telehealth: Payer: Self-pay

## 2024-06-16 NOTE — Progress Notes (Signed)
 Contacted patient to discuss potential medication access barriers related to use of non-preferred pharmacy.   Left HIPAA-compliant voicemail with direct call back number.   Woodie Jock, PharmD PGY1 Pharmacy Resident  06/16/2024

## 2024-06-17 ENCOUNTER — Encounter: Payer: Self-pay | Admitting: Radiology

## 2024-06-24 NOTE — Progress Notes (Signed)
 Patient returned my call today. Discussed preferred and non-preferred pharmacies with the [employer] Plan. She states that she will most likely not be staying with this Medicare plan in 2026. Provided my direct call back number if she has any questions or concerns.   Woodie Jock, PharmD PGY1 Pharmacy Resident  06/24/2024

## 2024-06-27 ENCOUNTER — Encounter: Payer: Self-pay | Admitting: Nurse Practitioner

## 2024-07-16 ENCOUNTER — Ambulatory Visit: Admitting: Physical Medicine and Rehabilitation

## 2024-07-16 ENCOUNTER — Other Ambulatory Visit: Payer: Self-pay

## 2024-07-16 VITALS — BP 145/84 | HR 72

## 2024-07-16 DIAGNOSIS — M5416 Radiculopathy, lumbar region: Secondary | ICD-10-CM | POA: Diagnosis not present

## 2024-07-16 MED ORDER — METHYLPREDNISOLONE ACETATE 40 MG/ML IJ SUSP
40.0000 mg | Freq: Once | INTRAMUSCULAR | Status: AC
  Administered 2024-07-16: 40 mg

## 2024-07-16 NOTE — Progress Notes (Unsigned)
 Pain Scale   Average Pain 3 Patient advising she has lower back pain that increases when walking and decreasing when sitting.         +Driver, -BT, -Dye Allergies.

## 2024-07-17 NOTE — Procedures (Signed)
 Lumbosacral Transforaminal Epidural Steroid Injection - Sub-Pedicular Approach with Fluoroscopic Guidance  Patient: Rebekah Hamilton      Date of Birth: [birth year] MRN: [id number] PCP: Dineen Channel, PA-C      Visit Date: 07/16/2024   Universal Protocol:    Date/Time: 07/16/2024  Consent Given By: the patient  Position: PRONE  Additional Comments: Vital signs were monitored before and after the procedure. Patient was prepped and draped in the usual sterile fashion. The correct patient, procedure, and site was verified.   Injection Procedure Details:   Procedure diagnoses: Lumbar radiculopathy [M54.16]    Meds Administered:  Meds ordered this encounter  Medications   methylPREDNISolone  acetate (DEPO-MEDROL ) injection 40 mg    Laterality: Right  Location/Site: L3  Needle:5.0 in., 22 ga.  Short bevel or Quincke spinal needle  Needle Placement: Transforaminal  Findings:    -Comments: Excellent flow of contrast along the nerve, nerve root and into the epidural space.  Procedure Details: After squaring off the end-plates to get a true AP view, the C-arm was positioned so that an oblique view of the foramen as noted above was visualized. The target area is just inferior to the nose of the scotty dog or sub pedicular. The soft tissues overlying this structure were infiltrated with 2-3 ml. of 1% Lidocaine  without Epinephrine.  The spinal needle was inserted toward the target using a trajectory view along the fluoroscope beam.  Under AP and lateral visualization, the needle was advanced so it did not puncture dura and was located close the 6 O'Clock position of the pedical in AP tracterory. Biplanar projections were used to confirm position. Aspiration was confirmed to be negative for CSF and/or blood. A 1-2 ml. volume of Isovue -250 was injected and flow of contrast was noted at each level. Radiographs were obtained for documentation purposes.   After attaining the  desired flow of contrast documented above, a 0.5 to 1.0 ml test dose of 0.25% Marcaine  was injected into each respective transforaminal space.  The patient was observed for 90 seconds post injection.  After no sensory deficits were reported, and normal lower extremity motor function was noted,   the above injectate was administered so that equal amounts of the injectate were placed at each foramen (level) into the transforaminal epidural space.   Additional Comments:  No complications occurred Dressing: 2 x 2 sterile gauze and Band-Aid    Post-procedure details: Patient was observed during the procedure. Post-procedure instructions were reviewed.  Patient left the clinic in stable condition.

## 2024-07-17 NOTE — Progress Notes (Signed)
 Rebekah Hamilton - 78 y.o. female MRN [id number]  Date of birth: 1946-03-16  Office Visit Note: Visit Date: 07/16/2024 PCP: Ostwalt, Janna, PA-C Referred by: Georgina Ozell LABOR, MD  Subjective: Chief Complaint  Patient presents with   Lower Back - Pain   HPI:  Rebekah Hamilton is a 78 y.o. female who comes in today at the request of Dr. Ozell Georgina for planned Right L3-4 Lumbar Transforaminal epidural steroid injection with fluoroscopic guidance.  The patient has failed conservative care including home exercise, medications, time and activity modification.  This injection will be diagnostic and hopefully therapeutic.  Please see requesting physician notes for further details and justification.   ROS Otherwise per HPI.  Assessment & Plan: Visit Diagnoses:    ICD-10-CM   1. Lumbar radiculopathy  M54.16 XR C-ARM NO REPORT    Epidural Steroid injection    methylPREDNISolone  acetate (DEPO-MEDROL ) injection 40 mg      Plan: No additional findings.   Meds & Orders:  Meds ordered this encounter  Medications   methylPREDNISolone  acetate (DEPO-MEDROL ) injection 40 mg    Orders Placed This Encounter  Procedures   XR C-ARM NO REPORT   Epidural Steroid injection    Follow-up: Return for visit to requesting provider as needed.   Procedures: No procedures performed  Lumbosacral Transforaminal Epidural Steroid Injection - Sub-Pedicular Approach with Fluoroscopic Guidance  Patient: Rebekah Hamilton      Date of Birth: 04-12-1946 MRN: [id number] PCP: Dineen Channel, PA-C      Visit Date: 07/16/2024   Universal Protocol:    Date/Time: 07/16/2024  Consent Given By: the patient  Position: PRONE  Additional Comments: Vital signs were monitored before and after the procedure. Patient was prepped and draped in the usual sterile fashion. The correct patient, procedure, and site was verified.   Injection Procedure Details:   Procedure diagnoses: Lumbar  radiculopathy [M54.16]    Meds Administered:  Meds ordered this encounter  Medications   methylPREDNISolone  acetate (DEPO-MEDROL ) injection 40 mg    Laterality: Right  Location/Site: L3  Needle:5.0 in., 22 ga.  Short bevel or Quincke spinal needle  Needle Placement: Transforaminal  Findings:    -Comments: Excellent flow of contrast along the nerve, nerve root and into the epidural space.  Procedure Details: After squaring off the end-plates to get a true AP view, the C-arm was positioned so that an oblique view of the foramen as noted above was visualized. The target area is just inferior to the nose of the scotty dog or sub pedicular. The soft tissues overlying this structure were infiltrated with 2-3 ml. of 1% Lidocaine  without Epinephrine.  The spinal needle was inserted toward the target using a trajectory view along the fluoroscope beam.  Under AP and lateral visualization, the needle was advanced so it did not puncture dura and was located close the 6 O'Clock position of the pedical in AP tracterory. Biplanar projections were used to confirm position. Aspiration was confirmed to be negative for CSF and/or blood. A 1-2 ml. volume of Isovue -250 was injected and flow of contrast was noted at each level. Radiographs were obtained for documentation purposes.   After attaining the desired flow of contrast documented above, a 0.5 to 1.0 ml test dose of 0.25% Marcaine  was injected into each respective transforaminal space.  The patient was observed for 90 seconds post injection.  After no sensory deficits were reported, and normal lower extremity motor function was noted,   the above  injectate was administered so that equal amounts of the injectate were placed at each foramen (level) into the transforaminal epidural space.   Additional Comments:  No complications occurred Dressing: 2 x 2 sterile gauze and Band-Aid    Post-procedure details: Patient was observed during the  procedure. Post-procedure instructions were reviewed.  Patient left the clinic in stable condition.    Clinical History: CLINICAL DATA:  Initial evaluation for right-sided radicular pain, limited range of motion.   EXAM: MRI CERVICAL SPINE WITHOUT CONTRAST   TECHNIQUE: Multiplanar, multisequence MR imaging of the cervical spine was performed. No intravenous contrast was administered.   COMPARISON:  Prior radiograph from 05/26/2022.   FINDINGS: Alignment: Straightening with mild reversal of the normal cervical lordosis. Trace stepwise degenerative retrolisthesis of C4 on C5, C5 on C6, and C6 on C7, with trace anterolisthesis of C7 on T1, T1 on T2, and T2 on T3. Findings likely chronic and facet mediated.   Vertebrae: Vertebral body height maintained without acute or chronic fracture. Bone marrow signal intensity within normal limits. 1 cm stir hyperintense lesion within the C3 vertebral body likely reflects an atypical hemangioma. No other discrete or worrisome osseous lesions. Mild discogenic reactive endplate changes present at C4-5 through C6-7. No other abnormal marrow edema.   Cord: Normal signal and morphology.   Posterior Fossa, vertebral arteries, paraspinal tissues: Chronic microvascular ischemic changes noted within the pons. Visualized brain and posterior fossa otherwise unremarkable. Craniocervical junction normal. Paraspinous soft tissues within normal limits. Normal flow voids seen within the vertebral arteries bilaterally. 1 cm right thyroid  nodule noted, doubtful significance given size and patient age, no follow-up imaging recommended (ref: J Am Coll Radiol. 2015 Feb;12(2): 143-50).   Disc levels:   C2-C3: Left-sided uncovertebral spurring without significant disc bulge. Mild left-sided facet hypertrophy. No spinal stenosis. Mild left C3 foraminal narrowing. Right neural foramen remains patent.   C3-C4: Mild right eccentric disc bulge with bilateral  uncovertebral spurring. Moderate right with mild left facet hypertrophy. No spinal stenosis. Moderate right with mild left C4 foraminal stenosis.   C4-C5: Degenerative intervertebral disc space narrowing with diffuse disc osteophyte complex. Broad posterior component effaces the ventral thecal sac. Superimposed mild facet and ligament flavum hypertrophy. Resultant moderate spinal stenosis with minimal cord flattening, but no cord signal changes. Severe bilateral C5 foraminal stenosis.   C5-C6: Degenerative vertebral disc space narrowing with diffuse disc osteophyte complex. Broad posterior component flattens and partially effaces the ventral thecal sac, slightly asymmetric to the right. Secondary cord flattening without cord signal changes. Moderate to severe spinal stenosis. Severe bilateral C6 foraminal narrowing.   C6-C7: Degenerative intervertebral disc space narrowing with diffuse disc osteophyte complex. Superimposed right paracentral disc protrusion indents the right ventral thecal sac (series 8, image 20). Secondary cord flattening without cord signal changes. Moderate spinal stenosis. Severe left with mild right C7 foraminal stenosis.   C7-T1: Mild disc bulge with uncovertebral spurring. Moderate left with mild right facet hypertrophy. No spinal stenosis. Foramina remain patent.   IMPRESSION: 1. Multilevel cervical spondylosis with resultant moderate to severe spinal stenosis at C4-5 through C6-7. Particular note made of a superimposed right paracentral disc protrusion at C6-7, which could contribute to right-sided radicular symptoms. 2. Multifactorial degenerative changes with resultant multilevel foraminal narrowing as above. Notable findings include moderate right C4 foraminal stenosis, severe bilateral C5 and C6 foraminal narrowing, with severe left C7 foraminal stenosis.     Electronically Signed   By: Morene Hoard M.D.   On: 06/05/2022 05:13  Objective:  VS:  HT:    WT:   BMI:     BP:(!) 145/84  HR:72bpm  TEMP: ( )  RESP:  Physical Exam Vitals and nursing note reviewed.  Constitutional:      General: She is not in acute distress.    Appearance: Normal appearance. She is not ill-appearing.  HENT:     Head: Normocephalic and atraumatic.     Right Ear: External ear normal.     Left Ear: External ear normal.  Eyes:     Extraocular Movements: Extraocular movements intact.  Cardiovascular:     Rate and Rhythm: Normal rate.     Pulses: Normal pulses.  Pulmonary:     Effort: Pulmonary effort is normal. No respiratory distress.  Abdominal:     General: There is no distension.     Palpations: Abdomen is soft.  Musculoskeletal:        General: Tenderness present.     Cervical back: Neck supple.     Right lower leg: No edema.     Left lower leg: No edema.     Comments: Patient has good distal strength with no pain over the greater trochanters.  No clonus or focal weakness.  Skin:    Findings: No erythema, lesion or rash.  Neurological:     General: No focal deficit present.     Mental Status: She is alert and oriented to person, place, and time.     Sensory: No sensory deficit.     Motor: No weakness or abnormal muscle tone.     Coordination: Coordination normal.  Psychiatric:        Mood and Affect: Mood normal.        Behavior: Behavior normal.      Imaging: XR C-ARM NO REPORT Result Date: 07/16/2024 Please see Notes tab for imaging impression.

## 2024-07-19 NOTE — Progress Notes (Unsigned)
 BH MD/PA/NP OP Progress Note  07/23/2024 5:13 PM Rebekah Hamilton  MRN:  [id number]  Chief Complaint:  Chief Complaint  Patient presents with   Follow-up   HPI:  This is a follow-up appointment for depression,  anxiety.  She  reports conflict with her brother (oil and water,.)  He does not let her do things such as cooking due to concern about her memory.   She feels a little down.  She does not want to be around with anybody, and is not getting out as much.  Things are not too much enjoying, which she also reports not having family in [place]. Both of her son and daughter are out of state.  She used to have large gathering.  She is also concerned about her memory.  She states that she was told by the provider in the past that she might lose memory as she was under anesthesia so long.  She  notices slight worsening in memory.  She expressed understanding to contact her neurologist to have follow-up appointment for the evaluation. The patient has mood symptoms as in PHQ-9/GAD-7. She sleeps for about 9 hours. She denies SI, hallucinations. Of note, she states that she does not think about bank account anymore as it would not help.  She has not contacted to make an appointment for therapy, although she is willing to try this.   Wt Readings from Last 3 Encounters:  07/23/24 156 lb (70.8 kg)  06/03/24 155 lb 9.6 oz (70.6 kg)  05/28/24 153 lb 8 oz (69.6 kg)      Substance use   Tobacco Alcohol Other substances/  Current denies Occasional drinks denies  Past denies Occasional drinks denies  Past Treatment            Support: Household: brother Marital status: widow, married twice Number of children: 2 (daughter in [place] , son lives in [place]) Employment: retired in 2023, doing audiological scientist for 23 years Education:   Visit Diagnosis:    ICD-10-CM   1. MDD (major depressive disorder), recurrent episode, mild  F33.0     2. Anxiety disorder, unspecified type  F41.9       Past  Psychiatric History: Please see initial evaluation for full details. I have reviewed the history. No updates at this time.     Past Medical History:  Past Medical History:  Diagnosis Date   Allergy    Anemia    Anxiety    Depression    Hyperlipidemia    Hypertension    Lumbar stenosis    Migraines    Prediabetes     Past Surgical History:  Procedure Laterality Date   BREAST LUMPECTOMY Bilateral    CHOLECYSTECTOMY  1999   SPINE SURGERY      Family Psychiatric History: Please see initial evaluation for full details. I have reviewed the history. No updates at this time.     Family History:  Family History  Problem Relation Age of Onset   Stroke Mother    Hypertension Mother    Heart attack Father 61   Other Maternal Grandmother        blood clot after surgery   Heart attack Paternal Grandmother     Social History:  Social History   Socioeconomic History   Marital status: Widowed    Spouse name: Not on file   Number of children: 2   Years of education: Not on file   Highest education level: Associate degree: academic program  Occupational  History   Occupation: airline pilot  Tobacco Use   Smoking status: Never   Smokeless tobacco: Never  Vaping Use   Vaping status: Never Used  Substance and Sexual Activity   Alcohol use: Yes    Alcohol/week: 2.0 standard drinks of alcohol    Types: 2 Glasses of wine per week   Drug use: No   Sexual activity: Not Currently  Other Topics Concern   Not on file  Social History Narrative   Not on file   Social Drivers of Health   Financial Resource Strain: Low Risk  (12/21/2023)   Overall Financial Resource Strain (CARDIA)    Difficulty of Paying Living Expenses: Not hard at all  Food Insecurity: No Food Insecurity (12/21/2023)   Hunger Vital Sign    Worried About Running Out of Food in the Last Year: Never true    Ran Out of Food in the Last Year: Never true  Transportation Needs: No Transportation Needs (12/21/2023)   PRAPARE  - Administrator, Civil Service (Medical): No    Lack of Transportation (Non-Medical): No  Physical Activity: Insufficiently Active (12/21/2023)   Exercise Vital Sign    Days of Exercise per Week: 2 days    Minutes of Exercise per Session: 30 min  Stress: Stress Concern Present (12/21/2023)   [employer] of Occupational Health - Occupational Stress Questionnaire    Feeling of Stress : To some extent  Social Connections: Moderately Integrated (12/21/2023)   Social Connection and Isolation Panel    Frequency of Communication with Friends and Family: More than three times a week    Frequency of Social Gatherings with Friends and Family: Once a week    Attends Religious Services: More than 4 times per year    Active Member of [employer] or Organizations: Yes    Attends Banker Meetings: More than 4 times per year    Marital Status: Widowed    Allergies:  Allergies  Allergen Reactions   Augmentin [Amoxicillin-Pot Clavulanate] Diarrhea   Doxycycline  Nausea Only   Egg White (Diagnostic) Nausea Only   Prozac [Fluoxetine Hcl] Other (See Comments)    Disoriented    Metabolic Disorder Labs: Lab Results  Component Value Date   HGBA1C 5.7 (H) 11/07/2023   MPG 123 09/14/2022   MPG 120 06/08/2022   No results found for: PROLACTIN Lab Results  Component Value Date   CHOL 162 04/30/2024   TRIG 102 04/30/2024   HDL 50 04/30/2024   CHOLHDL 3.1 11/07/2023   VLDL 25 02/20/2017   LDLCALC 93 04/30/2024   LDLCALC 89 11/07/2023   Lab Results  Component Value Date   TSH 2.280 11/07/2023   TSH 2.39 06/28/2023    Therapeutic Level Labs: No results found for: LITHIUM No results found for: VALPROATE No results found for: CBMZ  Current Medications: Current Outpatient Medications  Medication Sig Dispense Refill   Ascorbic Acid (VITAMIN C PO) 500 mg 2 (two) times daily.     aspirin 81 MG chewable tablet Chew by mouth daily.     buPROPion  (WELLBUTRIN  XL) 300  MG 24 hr tablet Take 300 mg by mouth daily.     butalbital -acetaminophen -caffeine  (FIORICET) 50-325-40 MG tablet Take 1 tablet by mouth every 6 (six) hours as needed for headache. 10 tablet 5   Cholecalciferol (VITAMIN D  PO) Take by mouth. Takes 10000 to 12000 units daily.     DULoxetine  (CYMBALTA ) 30 MG capsule TAKE 3 CAPSULES BY MOUTH DAILY FOR MOOD AND CHRONIC  PAIN 270 capsule 3   DULoxetine  (CYMBALTA ) 30 MG capsule Take 3 capsules (90 mg total) by mouth daily. 90 capsule 1   fexofenadine (ALLEGRA) 180 MG tablet Take 180 mg by mouth daily.     hydrochlorothiazide  (HYDRODIURIL ) 25 MG tablet Take 1/2 -1 tablet daily for BP and fluid for goal <130/80. 90 tablet 1   [START ON 07/31/2024] mirtazapine  (REMERON ) 15 MG tablet Take 1 tablet (15 mg total) by mouth at bedtime. 30 tablet 1   pravastatin  (PRAVACHOL ) 40 MG tablet Take one tablet at bedtime 90 tablet 3   No current facility-administered medications for this visit.     Musculoskeletal: Strength & Muscle Tone: within normal limits Gait & Station: normal Patient leans: N/A  Psychiatric Specialty Exam: Review of Systems  Psychiatric/Behavioral:  Positive for decreased concentration and dysphoric mood. Negative for agitation, behavioral problems, confusion, hallucinations, self-injury, sleep disturbance and suicidal ideas. The patient is nervous/anxious. The patient is not hyperactive.   All other systems reviewed and are negative.   Blood pressure (!) 140/91, pulse 64, temperature 97.6 F (36.4 C), temperature source Temporal, height 5' 4 (1.626 m), weight 156 lb (70.8 kg).Body mass index is 26.78 kg/m.  General Appearance: Well Groomed  Eye Contact:  Good  Speech:  Clear and Coherent  Volume:  Normal  Mood:  down  Affect:  Appropriate, Congruent, and concerned, full affect  Thought Process:  Coherent  Orientation:  Full (Time, Place, and Person)  Thought Content: Logical   Suicidal Thoughts:  No  Homicidal Thoughts:  No   Memory:  Immediate;   Good  Judgement:  Good  Insight:  Good  Psychomotor Activity:  Normal  Concentration:  Concentration: Good and Attention Span: Good  Recall:  Good  Fund of Knowledge: Good  Language: Good  Akathisia:  No  Handed:  Right  AIMS (if indicated): not done  Assets:  Communication Skills Desire for Improvement  ADL's:  Intact  Cognition: WNL  Sleep:  Fair   Screenings: GAD-7    Garment/textile Technologist Visit from 05/28/2024 in [facility] Office Visit from 04/23/2024 in [facility] Office Visit from 04/09/2024 in [facility] Office Visit from 11/07/2023 in [facility]  Total GAD-7 Score 7 14 7 7    PHQ2-9    Flowsheet Row Office Visit from 05/28/2024 in [facility] Office Visit from 04/23/2024 in [facility] Office Visit from 04/09/2024 in [facility] Office Visit from 11/07/2023 in [facility] Office Visit from 12/14/2022 in [facility]  PHQ-2 Total Score 4 6 2 4 2   PHQ-9 Total Score 10 17 12 15 5    Flowsheet Row ED from 04/16/2024 in [facility] Office Visit from 04/09/2024 in [facility]  C-SSRS RISK CATEGORY Error: Q3, 4, or 5 should not be populated when Q2 is No Error: Q3, 4, or 5 should not be populated when Q2 is No     Assessment and Plan:  Rebekah Hamilton is a 78 y.o.  female with a history of depression, anxiety, cognitive impairment, memory loss, hypertension, hyperlipidemia, s/p posterior lumbar interbody fusion L3-L4, who presents for follow up appointment for below.   1. MDD (major depressive disorder), recurrent episode, mild 2. Anxiety disorder, unspecified type She experiences migraines and has been struggling  with memory loss  since a fall at her grandchild's wedding in 2024. She experienced multiple profound losses, including the suicide of her first husband, the death of close friends and her primary care provider, and her retirement in 2023. Her son struggles with PTSD, and her daughter has endured the loss of both her husband and child, now raising her grandchildren.  History: no admission, no SA. Originally on duloxetine  60 mg daily, bupropion  300 mg daily      She continues to demonstrate calm affect, and does not ruminate on losing account anymore. Although she reports down mood with mild anhedonia, there is concern of polypharmacy. She is willing to work on behavioral activation before proceeding with medication adjustment. Will continue duloxetine  and mirtazapine  to target depression, anxiety. Will continue bupropion  as adjunctive treatment for depression. Referral was made to [facility]; however, given the difficulty in submitting paperwork, will also make referral onsite as well.    # memory loss No change. She reports memory loss, and she has a diagnosis of cognitive impairment.  It is unclear whether this hacking is related to this condition.  She was seen by neurology for memory issues. MOCA reportedly 23/30 on 11/2023 per record.  She was advised again to make a follow-up appointment with Dr. Onita, who she saw this year for memory loss.    Plan Continue duloxetine  90 mg daily  Continue bupropion  300 mg daily (verified with the pharmacy- she has refill left, filled by Ostwalt Janna, PA-C) Continue mirtazapine  15 mg at night Referred for CBT ([facility], onsite) Next appointment: 1/29 at 4:30, IP  Past trials of medication: fluoxetine    The patient demonstrates the following risk factors for suicide: Chronic risk factors for suicide include: psychiatric disorder of depression, anxiety and chronic pain. Acute risk factors for suicide include: family or marital conflict, unemployment, and loss  (financial, interpersonal, professional). Protective factors for this patient include: positive social support, responsibility to others (children, family), coping skills, and hope for the future. Considering these factors, the overall suicide risk at this point appears to be low. Patient is appropriate for outpatient follow up.   Collaboration of Care: Collaboration of Care: Other reviewed notes in Epic  Patient/Guardian was advised Release of Information must be obtained prior to any record release in order to collaborate their care with an outside provider. Patient/Guardian was advised if they have not already done so to contact the registration department to sign all necessary forms in order for us  to release information regarding their care.   Consent: Patient/Guardian gives verbal consent for treatment and assignment of benefits for services provided during this visit. Patient/Guardian expressed understanding and agreed to proceed.    Katheren Sleet, MD 07/23/2024, 5:13 PM

## 2024-07-23 ENCOUNTER — Other Ambulatory Visit: Payer: Self-pay

## 2024-07-23 ENCOUNTER — Ambulatory Visit (INDEPENDENT_AMBULATORY_CARE_PROVIDER_SITE_OTHER): Admitting: Psychiatry

## 2024-07-23 ENCOUNTER — Encounter: Payer: Self-pay | Admitting: Psychiatry

## 2024-07-23 VITALS — BP 140/91 | HR 64 | Temp 97.6°F | Ht 64.0 in | Wt 156.0 lb

## 2024-07-23 DIAGNOSIS — F330 Major depressive disorder, recurrent, mild: Secondary | ICD-10-CM

## 2024-07-23 DIAGNOSIS — F419 Anxiety disorder, unspecified: Secondary | ICD-10-CM | POA: Diagnosis not present

## 2024-07-23 MED ORDER — MIRTAZAPINE 15 MG PO TABS: 15.0000 mg | ORAL_TABLET | Freq: Every day | ORAL | 1 refills | Status: AC

## 2024-07-23 NOTE — Patient Instructions (Signed)
 Continue duloxetine  90 mg daily  Continue bupropion  300 mg daily  Continue mirtazapine  15 mg at night Next appointment: 12/9 at 4:30

## 2024-07-30 ENCOUNTER — Telehealth: Payer: Self-pay

## 2024-07-30 ENCOUNTER — Ambulatory Visit: Admitting: Physician Assistant

## 2024-07-30 ENCOUNTER — Encounter: Payer: Self-pay | Admitting: Physician Assistant

## 2024-07-30 VITALS — BP 134/71 | HR 69 | Resp 14 | Ht 64.0 in | Wt 156.4 lb

## 2024-07-30 DIAGNOSIS — I10 Essential (primary) hypertension: Secondary | ICD-10-CM

## 2024-07-30 DIAGNOSIS — F339 Major depressive disorder, recurrent, unspecified: Secondary | ICD-10-CM | POA: Diagnosis not present

## 2024-07-30 DIAGNOSIS — R413 Other amnesia: Secondary | ICD-10-CM | POA: Diagnosis not present

## 2024-07-30 DIAGNOSIS — E782 Mixed hyperlipidemia: Secondary | ICD-10-CM

## 2024-07-30 DIAGNOSIS — F419 Anxiety disorder, unspecified: Secondary | ICD-10-CM | POA: Diagnosis not present

## 2024-07-30 DIAGNOSIS — Z1231 Encounter for screening mammogram for malignant neoplasm of breast: Secondary | ICD-10-CM | POA: Diagnosis not present

## 2024-07-30 NOTE — Progress Notes (Signed)
 Pharmacy Quality Measure Review  This patient is appearing on a report for being at risk of failing the adherence measure for cholesterol (statin) medications this calendar year.   Medication: pravastatin  Last fill date: 04/30/24 for 90 day supply  Contacted pharmacy to facilitate refills.  Reaghan Kawa E. Marsh, PharmD, CPP Clinical Pharmacist [facility] [phone]

## 2024-07-30 NOTE — Progress Notes (Signed)
 Established patient visit  Patient: Rebekah Hamilton   DOB: 04/26/1946   78 y.o. Female  MRN: [id number] Visit Date: 07/30/2024  Today's healthcare provider: Jolynn Spencer, PA-C   Chief Complaint  Patient presents with   Medical Management of Chronic Issues   Subjective       Discussed the use of AI scribe software for clinical note transcription with the patient, who gave verbal consent to proceed.  History of Present Illness Rebekah Hamilton is a 78 year old female with hypertension and anxiety who presents with elevated blood pressure and stress management concerns.  She has chronic elevated blood pressure treated with hydrochlorothiazide  25 mg but does not monitor at home. She reports high stress related to family issues and the upcoming holidays.  She takes pravastatin  40 mg for lipids and duloxetine  plus Wellbutrin  300 mg for depression and anxiety. She is not in regular therapy despite prior counseling.  She has had multiple back surgeries with hardware complications and is worried about needing further surgery at her age.  About 18 months ago she had prolonged anesthesia during a second back surgery and has had memory problems since. She was told anesthesia affected her brain. She has not seen a neurologist for about a year since moving from [place].  Today she reports stress, memory issues, and elevated blood pressure. She denies chest pain, shortness of breath, or palpitations.       05/28/2024    8:20 AM 04/23/2024   10:04 AM 04/09/2024    2:37 PM  Depression screen PHQ 2/9  Decreased Interest 2 3   Down, Depressed, Hopeless 2 3   PHQ - 2 Score 4 6   Altered sleeping 0 1   Tired, decreased energy 1 2   Change in appetite 1 2   Feeling bad or failure about yourself  2 2   Trouble concentrating 1 2   Moving slowly or fidgety/restless 1 1   Suicidal thoughts  1   PHQ-9 Score 10  17    Difficult doing work/chores Somewhat difficult Somewhat  difficult      Information is confidential and restricted. Go to Review Flowsheets to unlock data.   Data saved with a previous flowsheet row definition      05/28/2024    8:21 AM 04/23/2024   10:04 AM 04/09/2024    2:37 PM 11/07/2023   11:20 AM  GAD 7 : Generalized Anxiety Score  Nervous, Anxious, on Edge 1 3  1   Control/stop worrying 1 3  1   Worry too much - different things 1 2  1   Trouble relaxing 1 2  1   Restless 1 0  0  Easily annoyed or irritable 1 1  2   Afraid - awful might happen 1 3  1   Total GAD 7 Score 7 14  7   Anxiety Difficulty Somewhat difficult Somewhat difficult  Somewhat difficult     Information is confidential and restricted. Go to Review Flowsheets to unlock data.    Medications: Show/hide medication list[1]  Review of Systems All negative Except see HPI       Objective    BP 134/71   Pulse 69   Resp 14   Ht 5' 4 (1.626 m)   Wt 156 lb 6.4 oz (70.9 kg)   SpO2 99%   BMI 26.85 kg/m     Physical Exam Vitals reviewed.  Constitutional:      General: She is not in acute distress.  Appearance: Normal appearance. She is well-developed. She is not diaphoretic.  HENT:     Head: Normocephalic and atraumatic.  Eyes:     General: No scleral icterus.    Conjunctiva/sclera: Conjunctivae normal.  Neck:     Thyroid : No thyromegaly.  Cardiovascular:     Rate and Rhythm: Normal rate and regular rhythm.     Pulses: Normal pulses.     Heart sounds: Normal heart sounds. No murmur heard. Pulmonary:     Effort: Pulmonary effort is normal. No respiratory distress.     Breath sounds: Normal breath sounds. No wheezing, rhonchi or rales.  Musculoskeletal:     Cervical back: Neck supple.     Right lower leg: No edema.     Left lower leg: No edema.  Lymphadenopathy:     Cervical: No cervical adenopathy.  Skin:    General: Skin is warm and dry.     Findings: No rash.  Neurological:     Mental Status: She is alert and oriented to person, place, and time.  Mental status is at baseline.  Psychiatric:        Mood and Affect: Mood normal.        Behavior: Behavior normal.      No results found for any visits on 07/30/24.      Assessment & Plan Hypertension Chronic and unstable Likely exacerbated by stress. Managed with hydrochlorothiazide  25 mg. - Purchase home blood pressure cuff. - Measure blood pressure every morning and evening or alternate days, record readings. - Contact provider if readings consistently above 130/80 mmHg. - Schedule follow-up in 4-5 weeks to review readings. - Consider additional antihypertensive if readings consistently high. Will follow-up  Depression and anxiety Chronic and unstable Managed with duloxetine  and Wellbutrin  300 mg. High stress due to family dynamics and lack of therapy. - Referral for counseling session with in-clinic counselor. - Schedule counseling appointment for beginning of next year. Collaboration of Care: Referral or follow-up with counselor/therapist AEB    Patient/Guardian was advised Release of Information must be obtained prior to any record release in order to collaborate their care with an outside provider. Patient/Guardian was advised if they have not already done so to contact the registration department to sign all necessary forms in order for us  to release information regarding their care.   Consent: Patient/Guardian gives verbal consent for treatment and assignment of benefits for services provided during this visit. Patient/Guardian expressed understanding and agreed to proceed.    Hyperlipidemia Chronic and stable Managed with pravastatin  40 mg. 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 No neurologist follow-up since moving. - Schedule neurologist appointment for memory evaluation. - Ensure neurologist notes sent to primary care for  continuity. Pt established care with neurology in [place]. Advised to follow-up with neurology.    No orders of the defined types were placed in this encounter.   No follow-ups on file.   The patient was advised to call back or seek an in-person evaluation if the symptoms worsen or if the condition fails to improve as anticipated.  I discussed the assessment and treatment plan with the patient. The patient was provided an opportunity to ask questions and all were answered. The patient agreed with the plan and demonstrated an understanding of the instructions.  I, Viraaj Vorndran, PA-C have reviewed all documentation for this visit. The documentation on 07/30/2024  for the exam, diagnosis, procedures,  and orders are all accurate and complete.  Jolynn Spencer, [facility], MMS [facility] [phone] (phone) [phone] (fax)  [facility]    [1]  Outpatient Medications Prior to Visit  Medication Sig   Ascorbic Acid (VITAMIN C PO) 500 mg 2 (two) times daily.   aspirin 81 MG chewable tablet Chew by mouth daily.   buPROPion  (WELLBUTRIN  XL) 300 MG 24 hr tablet Take 300 mg by mouth daily.   butalbital -acetaminophen -caffeine  (FIORICET) 50-325-40 MG tablet Take 1 tablet by mouth every 6 (six) hours as needed for headache.   Cholecalciferol (VITAMIN D  PO) Take by mouth. Takes 10000 to 12000 units daily.   DULoxetine  (CYMBALTA ) 30 MG capsule TAKE 3 CAPSULES BY MOUTH DAILY FOR MOOD AND CHRONIC PAIN   DULoxetine  (CYMBALTA ) 30 MG capsule Take 3 capsules (90 mg total) by mouth daily.   fexofenadine (ALLEGRA) 180 MG tablet Take 180 mg by mouth daily.   hydrochlorothiazide  (HYDRODIURIL ) 25 MG tablet Take 1/2 -1 tablet daily for BP and fluid for goal <130/80.   [START ON 07/31/2024] mirtazapine  (REMERON ) 15 MG tablet Take 1 tablet (15 mg total) by mouth at bedtime.   pravastatin  (PRAVACHOL ) 40 MG tablet Take one tablet at bedtime   No facility-administered medications prior  to visit.

## 2024-08-12 ENCOUNTER — Other Ambulatory Visit: Payer: Self-pay | Admitting: Nurse Practitioner

## 2024-08-12 DIAGNOSIS — I872 Venous insufficiency (chronic) (peripheral): Secondary | ICD-10-CM

## 2024-09-02 ENCOUNTER — Ambulatory Visit: Admitting: Licensed Clinical Social Worker

## 2024-09-02 DIAGNOSIS — F4329 Adjustment disorder with other symptoms: Secondary | ICD-10-CM

## 2024-09-02 NOTE — BH Specialist Note (Signed)
 Collaborative Care Initial Assessment   Pt name: Rebekah Hamilton MRN# [id number]   Date: 09/02/24  Session Start time 1400 Session End time: 1500 Total time in minutes: 60  No diagnosis found.   Type of Contact: In person  Patient consent obtained:  Yes  Patient and/or legal guardian verbally consented to [facility] services about presenting concerns and psychiatric consultation as appropriate.  The services will be billed as appropriate for the patient   Types of Service: Collaborative care and Health & Behavioral Assessment/Intervention  Summary  Rebekah Hamilton is a 79 y.o. y.o. adult patient with history of depression and anxiety seen in consultation at the request of [facility] for establishment of [facility] Collaborative Care management. Pt is currently taking the following psychiatric medications managed by Katheren Sleet MD: Duloxetine  90 mg, bupropion  300 mg, mirtazapine  15 mg.. Current symptoms include: ***.  Pt denies SI, HI, or AVH at time of session. Pt reports social EtOH--2 drinks per week, with no additional substance use concerns.   Reason for referral in patient/family's own words:   I have no idea why I am here, I am so confused  I don't know why I'm here--I am so confused. Not happy with doctors that I have been seen  PCP in [place] died last year--not happy with doctors. Accents, masks, in a rush.  Dr. Onita manages for cognitive impairment.    Self care: going to church (singles group)    Patient's goal for today's visit: Establish [facility] Collaborative Care  History of Present illness:    History of present illness: Rebekah Hamilton reports that they have a history of depression and anxiety for many years and have had the following treatments: Medication management by psychiatry.  Patient resides with her brother, who is her caregiver at time of session.  Patient reports that sometimes her relationship is  positive with brother, and sometimes there is conflict.  Patient reports that her brother is not understanding with her memory loss and often gets frustrated and impatient with her.  Pt reports concerns about medical history including cognitive impairment/memory loss, migraine disorder, hypertension, dizziness, history of falls, cervical spondylosis, scoliosis, stage III chronic kidney disease at time of session.  Pt reports that current external stressors include past history of 2 back surgeries for scoliosis, patient worries about ongoing memory loss (patient reports losing things, is forgetting things, forgetting her doctors, forgetting birthdays), patient has worries about finances and losing her money, patient reports paranoid ideation about people tapping into her laptop/phone and stealing her money, dealing with her first husbands suicide,. Pt feels that symptoms of stress, depression, and anxiety are impacting everyday functioning including sleep quality/quantity, social interaction, ability to engage in recreational activities, impacting appetite, and ability to engage with tasks both inside and outside of the home. Rebekah Hamilton reports that her brother is primary support system at time of assessment.   Patient reported significant frustration with the medical care community, and feels that her doctors do not communicate with her, and that  they just rushed around all the time like they are too busy.  IBH clinician encouraged patient to check in with her MyChart so that she has access to all of her after visit summaries from her providers of record.  Encouraged patient to read her after visit summaries, especially with neurology and psychiatry (patient reports that she does not remember/does not understand providers--encouraged patient to read their summary for clarification.  Encouraged patient to get brother or other support people to help her).  IBH collaborative care team feels patient  would benefit most with ongoing psychiatric medication management (continue with Dr. Vickey) and ongoing neurology assessment/management at time of session (Dr. Onita).  If patient feels that she needs periodic check-in's with counselor, IBH collaborative care supports would be indicated.   Due to current level of memory loss/cognitive level, higher level of care recommended for patient. (Neurology, psychiatry, neuropsychology).  Clinical Assessments (PHQ-9 and GAD-7)  PHQ-9 Assessments:     09/02/2024    3:35 PM 05/28/2024    8:20 AM 04/23/2024   10:04 AM  Depression screen PHQ 2/9  Decreased Interest 1 2 3   Down, Depressed, Hopeless 1 2 3   PHQ - 2 Score 2 4 6   Altered sleeping 0 0 1  Tired, decreased energy 1 1 2   Change in appetite 0 1 2  Feeling bad or failure about yourself  1 2 2   Trouble concentrating 0 1 2  Moving slowly or fidgety/restless 0 1 1  Suicidal thoughts 0  1  PHQ-9 Score 4 10  17    Difficult doing work/chores Somewhat difficult Somewhat difficult Somewhat difficult     Data saved with a previous flowsheet row definition     GAD-7 Assessments:     09/02/2024    3:34 PM 05/28/2024    8:21 AM 04/23/2024   10:04 AM 04/09/2024    2:37 PM  GAD 7 : Generalized Anxiety Score  Nervous, Anxious, on Edge 0 1  3  1    Control/stop worrying 1 1  3  1    Worry too much - different things 1 1  2  1    Trouble relaxing 0 1  2  1    Restless 0 1  0  0   Easily annoyed or irritable 1 1  1  2    Afraid - awful might happen 0 1  3  1    Total GAD 7 Score 3 7 14 7   Anxiety Difficulty Somewhat difficult Somewhat difficult Somewhat difficult Somewhat difficult     Data saved with a previous flowsheet row definition      Social History:  Household:  resides with brother--he is caregiver.  Marital status:   Number of Children:  2 children ([place] and son in [place] ).  Son w/ PTSD--witnessed son PTSD. Medicines are not calming him down. Pushing him to the edge.  DIL is  support person).  Daughter--husband died not too long ago. Granddaughter ([place] ) was killed in MVA a few years ago. Daughter is raising great granddaughter.  Employment:  worked as Marketing Executive:  college  Psychiatric Review of systems: Insomnia: Trouble falling asleep and staying asleep some nights Changes in appetite: Decreased Decreased need for sleep: No Family history of bipolar disorder: No Hallucinations: No   Paranoia: Yes patient reports that she feels that people are accessing her computer and phone to steal her banking information.  Patient reports that she went to the bank to express her concerns to them, and patient escalated the situation to the point where bank employees called the police and patient had to be escorted out of the bank.  Psychotropic medications: Duloxetine  90 mg, bupropion  300 mg, mirtazapine  15 mg  Current medications: Medications Ordered Prior to Encounter[1]   Patient taking medications as prescribed:  Yes Side effects reported: No  Psychiatric History   Have you ever been treated for a mental health problem? Yes If Yes,  when were you treated and whom did you see (psychiatrist/counselor) ?        When: Ongoing              name of provider: Dr. Katheren Sleet [facility]    Depression: Yes Anxiety: Yes Mania: No Psychosis: No PTSD symptoms: No  Past Psychiatric History/Hospitalization(s): Hospitalization for psychiatric illness: No Prior Self-injurious behavior: No  Have you ever had thoughts of harming yourself or others or attempted suicide? Hospitalized after husband died  No plan to harm self or others  Traumatic Experiences: History or current traumatic events (natural disaster, house fire, etc.)? Yes--patient reports that she had back surgery for scoliosis x 2, and that this was a very traumatizing event for her.  Patient reports after the surgery she noticed cognitive/memory deficits History or current physical trauma?   no History or current emotional trauma?  no History or current sexual trauma?  no History or current domestic or intimate partner violence?  no   Alcohol and/or Substance Use History   Tobacco Alcohol Other substances  Current use Patient denies (AUDIT-C screening) patient denies Patient denies  Past use Patient denies Patient denies Patient denies  Past treatment Pt denies Pt denies Pt denies   Flowsheet Row Office Visit from 12/22/2023 in [facility]  AUDIT-C Score 1      Withdrawal Potential: none  [place] Suicide Severity Rating Scale:  Flowsheet Row ED from 04/16/2024 in [facility] Office Visit from 04/09/2024 in [facility]  C-SSRS RISK CATEGORY Error: Q3, 4, or 5 should not be populated when Q2 is No Error: Q3, 4, or 5 should not be populated when Q2 is No     Guns in the home (secured):  no   The patient demonstrates the following risk factors for suicide: Chronic risk factors for suicide include: N/A. Acute risk factors for suicide include: N/A. Protective factors for this patient include: positive therapeutic relationship and hope for the future. Considering these factors, the overall suicide risk at this point appears to be low. Patient is appropriate for outpatient follow up.  Danger to Others Risk Assessment Danger to others risk factors:  NONE Patient endorses recent thoughts of harming others:  Pt denies Dynamic Appraisal of Situational Aggression (DASA): NONE  BH Counselor discussed emergency crisis plan with client and provided local emergency services resources.  Mental status exam:   General Appearance Rebekah Hamilton:  Neat Eye Contact:  Fair Motor Behavior:  Normal Speech:  Normal Level of Consciousness:  Confused Mood:  Anxious and Irritable Affect:  Appropriate and Blunt Anxiety Level:  Minimal Thought Process:  Circumstantial, Tangential, and  Disorganized Thought Content:  Rumination Perception:  Normal Judgment:  Fair Insight:  Present w limitations  Diagnosis: No diagnosis found.    Goals: Increase adequate support systems for patient/family   Interventions: Solution-Focused Strategies   Follow-up Plan: IBH collaborative care to check-in with patient to assess appointment compliance and refer as needed.  Patient already has psychiatrist and neurologist.  Tawni JONELLE Brisker, LCSW  Assessment completed by Tawni Brisker, MSW, LCSW  on 09/03/24      [1]  Current Outpatient Medications on File Prior to Visit  Medication Sig Dispense Refill   Ascorbic Acid (VITAMIN C PO) 500 mg 2 (two) times daily.     aspirin 81 MG chewable tablet Chew by mouth daily.     buPROPion  (WELLBUTRIN  XL) 300 MG 24 hr tablet Take 300 mg by  mouth daily.     butalbital -acetaminophen -caffeine  (FIORICET) 50-325-40 MG tablet Take 1 tablet by mouth every 6 (six) hours as needed for headache. 10 tablet 5   Cholecalciferol (VITAMIN D  PO) Take by mouth. Takes 10000 to 12000 units daily.     DULoxetine  (CYMBALTA ) 30 MG capsule TAKE 3 CAPSULES BY MOUTH DAILY FOR MOOD AND CHRONIC PAIN 270 capsule 3   DULoxetine  (CYMBALTA ) 30 MG capsule Take 3 capsules (90 mg total) by mouth daily. 90 capsule 1   fexofenadine (ALLEGRA) 180 MG tablet Take 180 mg by mouth daily.     hydrochlorothiazide  (HYDRODIURIL ) 25 MG tablet Take 1/2 -1 tablet daily for BP and fluid for goal <130/80. 90 tablet 1   mirtazapine  (REMERON ) 15 MG tablet Take 1 tablet (15 mg total) by mouth at bedtime. 30 tablet 1   pravastatin  (PRAVACHOL ) 40 MG tablet Take one tablet at bedtime 90 tablet 3   No current facility-administered medications on file prior to visit.

## 2024-09-02 NOTE — Patient Instructions (Signed)
 Using Behavioral Activation to manage stress/depression symptoms     Identify/understand your own mood triggers.   Structure your day--get up around the same time, eat meals/snacks around the same time, go to bed around the same time.   Purposefully schedule self care time and time to complete tasks. This can include quiet time.  Stimulate your brain--go for a walk, text/call a friend or family member, if you are indoors--go outside (and vice versa), go for a drive, go to a store with bright colors and bright lights. Try to do things in a different way--drive to your favorite places using an alternative route, or instead of starting on the right side of the grocery store when shopping, start on the left side. You might feel a bit uncomfortable doing things outside of the comfort zone, but this is helping the brain create new neural pathways and is very healthy for brain/emotional health.   Physical movement based on your ability. If you can go for a walk, do stretches, even waving your hands to music can trigger feel-good endorphins in the brain and help release physical tension we all hold in our bodies.  Even 5 minutes can make a difference.   Be intentional about doing things that bring you joy (or used to bring you joy), and look for the things in every day that make you happy.  Seek those glimmers of joy each day.  Set a timer for 5 minutes for a harder task (ex. Laundry, washing dishes).  Allow yourself to work distraction-free for 5 minutes, then stop when the timer goes off. If you need a break, take a break. If you want to continue working then set another timer for whatever time you choose.   Limit or eliminate substance use including alcohol, marijuana, or recreational use of prescription medication.  Let in the light!! Open the window blinds, curtains and let natural light in. Even sitting near a window or sitting outside can boost your mood, especially in the wintertime when  there is less daylight.   If you take medication to manage symptoms, remember to take all medications as they are prescribed (please read all labels!!)    Things to envision for ourselves to to improve inspiration, motivation, and initiative :  improving physical wellness, focus on family relationships, focusing on our own mental/emotional well being, being a part of a bigger community, finding a hobby, being a part of something that fosters personal growth, engaging socially with others (even digitally!!!)    ANXIETY/PANIC EPISODE MANAGEMENT (CBT/MINDFULNESS BASED)   If you are in a highly stimulating or triggering environment, change your location to a less stimulating or safer environment .   Stimulate your senses by tasting/eating a sour candy (such as a lemon drop) or a strong flavored cough drop.  This triggers smell, taste, and touch since we  have had lots of nerve endings inside of your mouth.   Practice slow, controlled breathing to avoid hyperventilation.  Breathing into your nose for the count of 4 inside your head, holding your breath for the count of 4 inside your head, and exhale slowly counting to 8 inside your head.  This is called 4-4-8 breathing, or triangle breathing. (Controlled breathing). This helps manage panic, anxiety, anger, and tearfulness.  Additional grounding exercises include rubbing your hands softly together, or wiggling toes inside your shoes, pretending to grasp the floor with your toes.    Listen to calming music or sounds  Count backwards in your head by  twos or tens or recite multiplication tables in your head.  Visualize a calming, happy place and identify 5 explicit details about this place (color, temperature, smells, visual details, etc.)  Splash cold water on your face or hold an ice cube in your hand (alternate hands)  Clench and release muscle groups (hands, shoulders, facial muscles)  Engage in soothing activities to recover after a  stressful/anxious episode such as: drinking a warm beverage, sitting in your favorite comfortable location, positive physical contact with a pet or weighted blanket, using positive self talk/positive affirmations.   Download PTSD Coach app for your phone or tablet--its free and has lots of tips to help you manage panic episodes no matter where you are!    Emergency Resources:  National Suicide & Crisis Lifeline: Call or text 988  Crisis Text Line: Text HOME to 332-080-6994  [facility]  [address] [place], [place] [address] [phone] or [phone] [facility] 24/7 FOR ANYONE [address], [place], [place]  [phone] Fax: [phone] [email or web address] *Interpreters available *Accepts all insurance and uninsured for Urgent Care needs *Accepts Medicaid and uninsured for outpatient treatment (below)

## 2024-09-08 NOTE — Progress Notes (Addendum)
 [facility] MD/PA/NP OP Progress Note  09/12/2024 5:24 PM Rebekah Hamilton  MRN:  [id number]  Chief Complaint:  Chief Complaint  Patient presents with   Follow-up   HPI:  - According to the chart review, the following events have occurred since the last visit: The patient was seen by Tawni Brisker , MSW, LCSW for collaborative care.  Paranoia: Yes patient reports that she feels that people are accessing her computer and phone to steal her banking information.  Patient reports that she went to the bank to express her concerns to them, and patient escalated the situation to the point where bank employees called the police and patient had to be escorted out of the bank. She reports concern of memory, and difficulty in understanding providers.   This is a follow-up appointment for depression, anxiety and cognitive impairment.  She states that she has been isolated and she does not want to socialize.  Her brother does not want her to cook due to concern about the memory.  She feels depressed and her memory is going crazy.  Depression-she reports worsening depression since her first ex-husband died from suicide.  She has been on duloxetine  since then.  She also lost her second ex-husband, and her mother.  She reports conflict with her brother at home. She currently feels somewhat depressed, not happier.  She tends to be worried too much.  She reports fluctuation in her appetite, and tends to have increase in appetite.  She sleeps up to 10 hours and feels fatigued at times, although she has occasional insomnia.  She denies SI, hallucinations.   Paranoia-when she was asked about bank account, she states that she would not tell anybody about this. (She then talks about her back pain). After she was redirected, although she is cautious, she denies any concern about people accessing to her computer.   Memory-she reports worsening in memory.  She is doing something, and it goes away after half an  hour.  She also has difficulty in remembering the name of her friends.  She does not cook anymore as her brother does not want her to do it as she does not wash her hands enough.  Although she takes care of bills, it is autopay.  She denies any issues with ADL. (Of note, she does not recall any conversation with Ms. Brisker)  MOYB test- Partial completion; recited months backward from November to April, then lost sequence (April, February, January) and was unable to state how far she had gone.   Wt Readings from Last 3 Encounters:  09/12/24 161 lb 12.8 oz (73.4 kg)  07/30/24 156 lb 6.4 oz (70.9 kg)  07/23/24 156 lb (70.8 kg)     Substance use   Tobacco Alcohol Other substances/  Current denies Occasional drinks denies  Past denies Occasional drinks denies  Past Treatment            Support: Household: brother Marital status: widow, married twice Number of children: 2 (daughter in [place] , son [place]) Employment: retired in 2023, doing audiological scientist for 23 years Education:     Visit Diagnosis:    ICD-10-CM   1. MDD (major depressive disorder), recurrent episode, mild  F33.0     2. Anxiety disorder, unspecified type  F41.9     3. Cognitive impairment  R41.89       Past Psychiatric History: Please see initial evaluation for full details. I have reviewed the history. No updates at this time.  Past Medical History:  Past Medical History:  Diagnosis Date   Allergy    Anemia    Anxiety    Depression    Hyperlipidemia    Hypertension    Lumbar stenosis    Migraines    Prediabetes     Past Surgical History:  Procedure Laterality Date   BREAST LUMPECTOMY Bilateral    CHOLECYSTECTOMY  1999   SPINE SURGERY      Family Psychiatric History: Please see initial evaluation for full details. I have reviewed the history. No updates at this time.     Family History:  Family History  Problem Relation Age of Onset   Stroke Mother    Hypertension Mother    Heart  attack Father 20   Other Maternal Grandmother        blood clot after surgery   Heart attack Paternal Grandmother     Social History:  Social History   Socioeconomic History   Marital status: Widowed    Spouse name: Not on file   Number of children: 2   Years of education: Not on file   Highest education level: Associate degree: academic program  Occupational History   Occupation: accountant  Tobacco Use   Smoking status: Never   Smokeless tobacco: Never  Vaping Use   Vaping status: Never Used  Substance and Sexual Activity   Alcohol use: Yes    Alcohol/week: 2.0 standard drinks of alcohol    Types: 2 Glasses of wine per week   Drug use: No   Sexual activity: Not Currently  Other Topics Concern   Not on file  Social History Narrative   Not on file   Social Drivers of Health   Tobacco Use: Low Risk (09/12/2024)   Patient History    Smoking Tobacco Use: Never    Smokeless Tobacco Use: Never    Passive Exposure: Not on file  Financial Resource Strain: Low Risk (12/21/2023)   Overall Financial Resource Strain (CARDIA)    Difficulty of Paying Living Expenses: Not hard at all  Food Insecurity: No Food Insecurity (12/21/2023)   Hunger Vital Sign    Worried About Running Out of Food in the Last Year: Never true    Ran Out of Food in the Last Year: Never true  Transportation Needs: No Transportation Needs (12/21/2023)   PRAPARE - Administrator, Civil Service (Medical): No    Lack of Transportation (Non-Medical): No  Physical Activity: Insufficiently Active (12/21/2023)   Exercise Vital Sign    Days of Exercise per Week: 2 days    Minutes of Exercise per Session: 30 min  Stress: Stress Concern Present (12/21/2023)   [employer] of Occupational Health - Occupational Stress Questionnaire    Feeling of Stress : To some extent  Social Connections: Moderately Integrated (12/21/2023)   Social Connection and Isolation Panel    Frequency of Communication with Friends  and Family: More than three times a week    Frequency of Social Gatherings with Friends and Family: Once a week    Attends Religious Services: More than 4 times per year    Active Member of [employer] or Organizations: Yes    Attends Banker Meetings: More than 4 times per year    Marital Status: Widowed  Depression (PHQ2-9): Low Risk (09/02/2024)   Depression (PHQ2-9)    PHQ-2 Score: 4  Alcohol Screen: Low Risk (12/21/2023)   Alcohol Screen    Last Alcohol Screening Score (AUDIT): 1  Housing: Low Risk (12/21/2023)   Housing Stability Vital Sign    Unable to Pay for Housing in the Last Year: No    Number of Times Moved in the Last Year: 0    Homeless in the Last Year: No  Utilities: Not At Risk (11/07/2023)   AHC Utilities    Threatened with loss of utilities: No  Health Literacy: Adequate Health Literacy (11/07/2023)   B1300 Health Literacy    Frequency of need for help with medical instructions: Never    Allergies: Allergies[1]  Metabolic Disorder Labs: Lab Results  Component Value Date   HGBA1C 5.7 (H) 11/07/2023   MPG 123 09/14/2022   MPG 120 06/08/2022   No results found for: PROLACTIN Lab Results  Component Value Date   CHOL 162 04/30/2024   TRIG 102 04/30/2024   HDL 50 04/30/2024   CHOLHDL 3.1 11/07/2023   VLDL 25 02/20/2017   LDLCALC 93 04/30/2024   LDLCALC 89 11/07/2023   Lab Results  Component Value Date   TSH 2.280 11/07/2023   TSH 2.39 06/28/2023    Therapeutic Level Labs: No results found for: LITHIUM No results found for: VALPROATE No results found for: CBMZ  Current Medications: Current Outpatient Medications  Medication Sig Dispense Refill   Ascorbic Acid (VITAMIN C PO) 500 mg 2 (two) times daily.     aspirin 81 MG chewable tablet Chew by mouth daily.     buPROPion  (WELLBUTRIN  XL) 300 MG 24 hr tablet Take 300 mg by mouth daily.     butalbital -acetaminophen -caffeine  (FIORICET) 50-325-40 MG tablet Take 1 tablet by mouth every 6  (six) hours as needed for headache. 10 tablet 5   Cholecalciferol (VITAMIN D  PO) Take by mouth. Takes 10000 to 12000 units daily.     DULoxetine  (CYMBALTA ) 30 MG capsule TAKE 3 CAPSULES BY MOUTH DAILY FOR MOOD AND CHRONIC PAIN 270 capsule 3   DULoxetine  (CYMBALTA ) 30 MG capsule Take 3 capsules (90 mg total) by mouth daily. 90 capsule 1   fexofenadine (ALLEGRA) 180 MG tablet Take 180 mg by mouth daily.     hydrochlorothiazide  (HYDRODIURIL ) 25 MG tablet Take 1/2 -1 tablet daily for BP and fluid for goal <130/80. 90 tablet 1   mirtazapine  (REMERON ) 15 MG tablet Take 1 tablet (15 mg total) by mouth at bedtime. (Patient not taking: Reported on 09/12/2024) 30 tablet 1   pravastatin  (PRAVACHOL ) 40 MG tablet Take one tablet at bedtime 90 tablet 3   No current facility-administered medications for this visit.     Musculoskeletal: Strength & Muscle Tone: within normal limits Gait & Station: normal Patient leans: N/A  Psychiatric Specialty Exam: Review of Systems  Psychiatric/Behavioral:  Positive for decreased concentration, dysphoric mood and sleep disturbance. Negative for agitation, behavioral problems, confusion, hallucinations, self-injury and suicidal ideas. The patient is nervous/anxious. The patient is not hyperactive.   All other systems reviewed and are negative.   Blood pressure 119/74, pulse 79, temperature (!) 97.3 F (36.3 C), temperature source Temporal, height 5' 4 (1.626 m), weight 161 lb 12.8 oz (73.4 kg).Body mass index is 27.77 kg/m.  General Appearance: Well Groomed  Eye Contact:  Good  Speech:  Clear and Coherent  Volume:  Normal  Mood:  Anxious and Depressed  Affect:  Appropriate, Congruent, and calm  Thought Process:  Coherent  Orientation:  Full (Time, Place, and Person)  Thought Content: Logical   Suicidal Thoughts:  No  Homicidal Thoughts:  No  Memory:  Immediate;   Good  Judgement:  Good  Insight:  Good  Psychomotor Activity:  Normal  Concentration:   Concentration: Fair and Attention Span: Fair  Recall:  Poor  Fund of Knowledge: Good  Language: Good  Akathisia:  No  Handed:  Right  AIMS (if indicated): not done  Assets:  Communication Skills Desire for Improvement  ADL's:  Intact  Cognition: WNL  Sleep:  hypersomnia   Screenings: GAD-7    Psychologist, Occupational Health from 09/02/2024 in [facility] Office Visit from 05/28/2024 in [facility] Office Visit from 04/23/2024 in [facility] Office Visit from 04[facility]09/2024 in [facility] Office Visit from 11/07/2023 in [facility]  Total GAD-7 Score 3 7 14 7 7    PHQ2-9    Flowsheet Row Integrated Behavioral Health from 09/02/2024 in [facility] Office Visit from 05/28/2024 in [facility] Office Visit from 04/23/2024 in [facility] Office Visit from 04/09/2024 in [facility] Office Visit from 11/07/2023 in [facility]  PHQ-2 Total Score 2 4 6 2 4   PHQ-9 Total Score 4 10 17 12 15    Flowsheet Row Integrated Behavioral Health from 09/02/2024 in [facility] ED from 04/16/2024 in [facility] Office Visit from 04/09/2024 in [facility]  C-SSRS RISK CATEGORY No Risk Error: Q3, 4, or 5 should not be populated when Q2 is No Error: Q3, 4, or 5 should not be populated when Q2 is No     Assessment and Plan:  Rebekah Hamilton is a 79 y.o.  female with a history of depression, anxiety, cognitive impairment, memory loss, hypertension, hyperlipidemia, s/p posterior lumbar interbody fusion L3-L4, who presents for follow up appointment for below.   1. MDD (major depressive disorder), recurrent episode, mild 2.  Anxiety disorder, unspecified type She experiences migraines and has been struggling with memory loss since a fall at her grandchilds wedding in 2024. She experienced multiple profound losses, including the suicide of her first husband, the death of close friends and her primary care provider, and her retirement in 2023. She lost her second husband two years ago, and her mother.  Her son struggles with PTSD, and her daughter has endured the loss of both her husband and child, now raising her grandchildren.  History: reports depression since loss of her first ex-husband from suicide. no admission, no SA. Originally on duloxetine  60 mg daily (for many years), bupropion  300 mg daily, 03/2024   She continues to experience down mood with marked anhedonia. Noted that although there has been significant improvement in anxiety, rumination on bank account, somatic symptoms of decrease in appetite, insomnia since starting mirtazapine /uptitration of duloxetine , she now experiences  possible adverse reaction of increase in appetite , and occasional hypersomnia.  Will taper off mirtazapine  to reduce the side effect and to avoid polypharmacy while closely monitoring any relapse in her mood symptoms. It is noted that it is hard to assess the effectiveness of duloxetine  and bupropion , she has been on since the initial visit.  May consider adjusting of this medication if any worsening in her mood symptoms.  She will greatly benefit from CBT; she will continue therapy through her collective care at this time.   3. Cognitive impairment # r/o mild neurocognitive disorder - seen by Dr. Onita, neurology in the past. [facility]  reportedly 23/30 on 11/2023 per record. She reports worsening in short-term recall.  She has cognitive deficits as evidenced by months of the year backwards test and clinical interview.   It is difficult to discern how much her mood symptoms are contributing to this. It is also noted that she experienced an episode  of concern regarding hacking of her bank account, which could represent prodromal symptoms of cognitive impairment.  She denies any paranoia on today's visit, or any other neuropsychiatric symptoms except the mood symptoms as outlined above. Will taper off mirtazapine  to reduce potential cognitive side effect, although she is alert during the entire visit. Will obtain labs to rule out medical health issues contributing to her symptoms. She was advised to make a follow-up with Dr. Onita, or try [facility] for further evaluation.  Collateral from her brother was not obtained at this time due to conflict in their relationship. Consideration may be given to reaching out to another family member.  Plan Continue duloxetine  90 mg daily  Continue bupropion  300 mg daily (verified with the pharmacy- she has refill left, filled by Ostwalt Janna, PA-C) Decrease mirtazapine  7.5 mg at night for one week, then discontinue (due to increase in appetite, hypersomnia. originally started 04/2024) Obtain labs at [facility] (vitamin B12, folate)- sent message about this Next appointment: 4/7 at 11 AM, IP   Past trials of medication: fluoxetine    The patient demonstrates the following risk factors for suicide: Chronic risk factors for suicide include: psychiatric disorder of depression, anxiety and chronic pain. Acute risk factors for suicide include: family or marital conflict, unemployment, and loss (financial, interpersonal, professional). Protective factors for this patient include: positive social support, responsibility to others (children, family), coping skills, and hope for the future. Considering these factors, the overall suicide risk at this point appears to be low. Patient is appropriate for outpatient follow up.    I personally spent a total of 42 minutes in the care of the patient today including preparing to see the patient, getting/reviewing separately obtained history, performing a medically appropriate  exam/evaluation, placing orders, and documenting clinical information in the EHR.   Collaboration of Care: Collaboration of Care: Other reviewed notes in Epic  Patient/Guardian was advised Release of Information must be obtained prior to any record release in order to collaborate their care with an outside provider. Patient/Guardian was advised if they have not already done so to contact the registration department to sign all necessary forms in order for us  to release information regarding their care.   Consent: Patient/Guardian gives verbal consent for treatment and assignment of benefits for services provided during this visit. Patient/Guardian expressed understanding and agreed to proceed.    Katheren Sleet, MD 09/12/2024, 5:24 PM     [1]  Allergies Allergen Reactions   Augmentin [Amoxicillin-Pot Clavulanate] Diarrhea   Doxycycline  Nausea Only   Egg White (Diagnostic) Nausea Only   Prozac [Fluoxetine Hcl] Other (See Comments)    Disoriented

## 2024-09-11 ENCOUNTER — Telehealth: Payer: Self-pay | Admitting: Licensed Clinical Social Worker

## 2024-09-11 DIAGNOSIS — F4329 Adjustment disorder with other symptoms: Secondary | ICD-10-CM
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
## 2024-09-12 ENCOUNTER — Ambulatory Visit (INDEPENDENT_AMBULATORY_CARE_PROVIDER_SITE_OTHER): Admitting: Psychiatry

## 2024-09-12 ENCOUNTER — Encounter: Payer: Self-pay | Admitting: Psychiatry

## 2024-09-12 ENCOUNTER — Other Ambulatory Visit: Payer: Self-pay

## 2024-09-12 VITALS — BP 119/74 | HR 79 | Temp 97.3°F | Ht 64.0 in | Wt 161.8 lb

## 2024-09-12 DIAGNOSIS — R4189 Other symptoms and signs involving cognitive functions and awareness: Secondary | ICD-10-CM | POA: Diagnosis not present

## 2024-09-12 DIAGNOSIS — F419 Anxiety disorder, unspecified: Secondary | ICD-10-CM | POA: Diagnosis not present

## 2024-09-12 DIAGNOSIS — F330 Major depressive disorder, recurrent, mild: Secondary | ICD-10-CM | POA: Diagnosis not present

## 2024-09-12 NOTE — Patient Instructions (Signed)
 Continue duloxetine  90 mg daily  Continue bupropion  300 mg daily  Decrease mirtazapine  7.5 mg at night for one week, then discontinue  Next appointment: 4/7 at 11 AM

## 2024-09-13 ENCOUNTER — Encounter: Payer: Self-pay | Admitting: Psychiatry

## 2024-09-13 NOTE — Addendum Note (Signed)
 Addended by: Lismary Kiehn on: 09/13/2024 02:08 PM   Modules accepted: Orders

## 2024-09-30 ENCOUNTER — Ambulatory Visit: Admitting: Licensed Clinical Social Worker

## 2024-10-14 ENCOUNTER — Ambulatory Visit: Admitting: Licensed Clinical Social Worker

## 2024-11-19 ENCOUNTER — Ambulatory Visit: Admitting: Psychiatry
